# Patient Record
Sex: Female | Born: 1950
Health system: Southern US, Community
[De-identification: ages and names within clinical notes are randomized; demographics above are authoritative.]

## PROBLEM LIST (undated history)

## (undated) DIAGNOSIS — W19XXXA Unspecified fall, initial encounter: Secondary | ICD-10-CM

## (undated) DIAGNOSIS — F419 Anxiety disorder, unspecified: Secondary | ICD-10-CM

## (undated) DIAGNOSIS — N189 Chronic kidney disease, unspecified: Secondary | ICD-10-CM

## (undated) DIAGNOSIS — I10 Essential (primary) hypertension: Secondary | ICD-10-CM

## (undated) DIAGNOSIS — J189 Pneumonia, unspecified organism: Secondary | ICD-10-CM

## (undated) DIAGNOSIS — F4310 Post-traumatic stress disorder, unspecified: Secondary | ICD-10-CM

## (undated) DIAGNOSIS — Z87442 Personal history of urinary calculi: Secondary | ICD-10-CM

## (undated) DIAGNOSIS — Z8619 Personal history of other infectious and parasitic diseases: Secondary | ICD-10-CM

## (undated) DIAGNOSIS — M1990 Unspecified osteoarthritis, unspecified site: Secondary | ICD-10-CM

## (undated) DIAGNOSIS — IMO0002 Reserved for concepts with insufficient information to code with codable children: Secondary | ICD-10-CM

## (undated) DIAGNOSIS — E669 Obesity, unspecified: Secondary | ICD-10-CM

## (undated) DIAGNOSIS — E049 Nontoxic goiter, unspecified: Secondary | ICD-10-CM

## (undated) DIAGNOSIS — K219 Gastro-esophageal reflux disease without esophagitis: Secondary | ICD-10-CM

## (undated) DIAGNOSIS — E119 Type 2 diabetes mellitus without complications: Secondary | ICD-10-CM

## (undated) DIAGNOSIS — E785 Hyperlipidemia, unspecified: Secondary | ICD-10-CM

## (undated) DIAGNOSIS — E039 Hypothyroidism, unspecified: Secondary | ICD-10-CM

## (undated) DIAGNOSIS — E079 Disorder of thyroid, unspecified: Secondary | ICD-10-CM

## (undated) DIAGNOSIS — F32A Depression, unspecified: Secondary | ICD-10-CM

## (undated) DIAGNOSIS — H269 Unspecified cataract: Secondary | ICD-10-CM

## (undated) DIAGNOSIS — F0390 Unspecified dementia without behavioral disturbance: Secondary | ICD-10-CM

## (undated) DIAGNOSIS — IMO0001 Reserved for inherently not codable concepts without codable children: Secondary | ICD-10-CM

## (undated) DIAGNOSIS — G5600 Carpal tunnel syndrome, unspecified upper limb: Secondary | ICD-10-CM

## (undated) DIAGNOSIS — F329 Major depressive disorder, single episode, unspecified: Secondary | ICD-10-CM

## (undated) HISTORY — PX: CATARACT EXTRACTION, BILATERAL: SHX1313

## (undated) HISTORY — DX: Hyperlipidemia, unspecified: E78.5

## (undated) HISTORY — DX: Reserved for concepts with insufficient information to code with codable children: IMO0002

## (undated) HISTORY — DX: Carpal tunnel syndrome, unspecified upper limb: G56.00

## (undated) HISTORY — DX: Anxiety disorder, unspecified: F41.9

## (undated) HISTORY — PX: COLONOSCOPY W/ POLYPECTOMY: SHX1380

## (undated) HISTORY — DX: Obesity, unspecified: E66.9

## (undated) HISTORY — DX: Unspecified cataract: H26.9

## (undated) HISTORY — DX: Major depressive disorder, single episode, unspecified: F32.9

## (undated) HISTORY — DX: Depression, unspecified: F32.A

## (undated) HISTORY — DX: Essential (primary) hypertension: I10

---

## 1974-08-13 DIAGNOSIS — I499 Cardiac arrhythmia, unspecified: Secondary | ICD-10-CM

## 1974-08-13 HISTORY — DX: Cardiac arrhythmia, unspecified: I49.9

## 1977-08-13 DIAGNOSIS — S0500XA Injury of conjunctiva and corneal abrasion without foreign body, unspecified eye, initial encounter: Secondary | ICD-10-CM

## 1977-08-13 HISTORY — DX: Injury of conjunctiva and corneal abrasion without foreign body, unspecified eye, initial encounter: S05.00XA

## 1994-08-13 HISTORY — PX: ABDOMINAL HYSTERECTOMY: SHX81

## 1998-08-01 ENCOUNTER — Encounter: Admission: RE | Admit: 1998-08-01 | Discharge: 1998-08-01 | Payer: Self-pay | Admitting: *Deleted

## 2000-02-01 ENCOUNTER — Emergency Department (HOSPITAL_COMMUNITY): Admission: EM | Admit: 2000-02-01 | Discharge: 2000-02-01 | Payer: Self-pay | Admitting: Emergency Medicine

## 2000-02-01 ENCOUNTER — Encounter: Payer: Self-pay | Admitting: Emergency Medicine

## 2004-06-20 ENCOUNTER — Ambulatory Visit: Payer: Self-pay | Admitting: Internal Medicine

## 2005-02-07 ENCOUNTER — Ambulatory Visit: Payer: Self-pay | Admitting: Internal Medicine

## 2005-02-28 ENCOUNTER — Ambulatory Visit: Payer: Self-pay | Admitting: Licensed Clinical Social Worker

## 2005-03-07 ENCOUNTER — Ambulatory Visit: Payer: Self-pay | Admitting: Internal Medicine

## 2005-05-09 ENCOUNTER — Ambulatory Visit: Payer: Self-pay | Admitting: Internal Medicine

## 2005-06-14 ENCOUNTER — Ambulatory Visit: Payer: Self-pay | Admitting: Internal Medicine

## 2005-07-09 ENCOUNTER — Ambulatory Visit: Payer: Self-pay | Admitting: Internal Medicine

## 2005-09-10 ENCOUNTER — Ambulatory Visit: Payer: Self-pay | Admitting: Internal Medicine

## 2005-11-16 ENCOUNTER — Emergency Department (HOSPITAL_COMMUNITY): Admission: EM | Admit: 2005-11-16 | Discharge: 2005-11-16 | Payer: Self-pay | Admitting: Emergency Medicine

## 2006-01-01 ENCOUNTER — Ambulatory Visit: Payer: Self-pay | Admitting: Internal Medicine

## 2006-02-26 ENCOUNTER — Ambulatory Visit: Payer: Self-pay | Admitting: Internal Medicine

## 2006-03-04 ENCOUNTER — Ambulatory Visit: Payer: Self-pay | Admitting: Internal Medicine

## 2006-04-30 ENCOUNTER — Ambulatory Visit: Payer: Self-pay | Admitting: Internal Medicine

## 2006-05-07 ENCOUNTER — Ambulatory Visit: Payer: Self-pay | Admitting: Internal Medicine

## 2006-09-10 ENCOUNTER — Ambulatory Visit: Payer: Self-pay | Admitting: Internal Medicine

## 2006-10-15 ENCOUNTER — Ambulatory Visit: Payer: Self-pay | Admitting: Internal Medicine

## 2006-12-25 DIAGNOSIS — E785 Hyperlipidemia, unspecified: Secondary | ICD-10-CM | POA: Insufficient documentation

## 2006-12-25 DIAGNOSIS — I10 Essential (primary) hypertension: Secondary | ICD-10-CM | POA: Insufficient documentation

## 2006-12-25 DIAGNOSIS — E669 Obesity, unspecified: Secondary | ICD-10-CM | POA: Insufficient documentation

## 2006-12-26 ENCOUNTER — Ambulatory Visit: Payer: Self-pay | Admitting: Internal Medicine

## 2007-01-10 ENCOUNTER — Encounter: Admission: RE | Admit: 2007-01-10 | Discharge: 2007-01-10 | Payer: Self-pay | Admitting: Internal Medicine

## 2007-02-13 DIAGNOSIS — F329 Major depressive disorder, single episode, unspecified: Secondary | ICD-10-CM | POA: Insufficient documentation

## 2007-02-13 DIAGNOSIS — F419 Anxiety disorder, unspecified: Secondary | ICD-10-CM | POA: Insufficient documentation

## 2007-03-04 ENCOUNTER — Ambulatory Visit: Payer: Self-pay | Admitting: Internal Medicine

## 2007-03-04 DIAGNOSIS — G5600 Carpal tunnel syndrome, unspecified upper limb: Secondary | ICD-10-CM | POA: Insufficient documentation

## 2007-03-04 DIAGNOSIS — L570 Actinic keratosis: Secondary | ICD-10-CM | POA: Insufficient documentation

## 2007-06-11 ENCOUNTER — Ambulatory Visit: Payer: Self-pay | Admitting: Internal Medicine

## 2007-06-11 LAB — CONVERTED CEMR LAB
Cholesterol, target level: 200 mg/dL
HDL goal, serum: 40 mg/dL
LDL Goal: 130 mg/dL

## 2007-08-15 ENCOUNTER — Telehealth: Payer: Self-pay | Admitting: Internal Medicine

## 2007-08-21 ENCOUNTER — Telehealth: Payer: Self-pay | Admitting: Internal Medicine

## 2007-09-18 ENCOUNTER — Ambulatory Visit: Payer: Self-pay | Admitting: Internal Medicine

## 2007-09-18 DIAGNOSIS — T887XXA Unspecified adverse effect of drug or medicament, initial encounter: Secondary | ICD-10-CM | POA: Insufficient documentation

## 2007-09-18 LAB — CONVERTED CEMR LAB
ALT: 19 units/L (ref 0–35)
AST: 19 units/L (ref 0–37)
Albumin: 3.8 g/dL (ref 3.5–5.2)
Alkaline Phosphatase: 72 units/L (ref 39–117)
Bilirubin, Direct: 0.2 mg/dL (ref 0.0–0.3)
Cholesterol: 224 mg/dL (ref 0–200)
Direct LDL: 145.2 mg/dL
HDL: 24.8 mg/dL — ABNORMAL LOW (ref >39.0)
Total Bilirubin: 1.2 mg/dL (ref 0.3–1.2)
Total CHOL/HDL Ratio: 9
Total Protein: 6.6 g/dL (ref 6.0–8.3)
Triglycerides: 224 mg/dL (ref 0–149)
VLDL: 45 mg/dL — ABNORMAL HIGH (ref 0–40)

## 2007-12-18 ENCOUNTER — Ambulatory Visit: Payer: Self-pay | Admitting: Internal Medicine

## 2008-02-10 ENCOUNTER — Encounter: Admission: RE | Admit: 2008-02-10 | Discharge: 2008-02-10 | Payer: Self-pay | Admitting: Internal Medicine

## 2008-02-11 ENCOUNTER — Ambulatory Visit: Payer: Self-pay | Admitting: Internal Medicine

## 2008-02-11 LAB — CONVERTED CEMR LAB
ALT: 21 units/L (ref 0–35)
AST: 19 units/L (ref 0–37)
Albumin: 3.5 g/dL (ref 3.5–5.2)
Alkaline Phosphatase: 72 units/L (ref 39–117)
Bilirubin, Direct: 0.1 mg/dL (ref 0.0–0.3)
Cholesterol: 160 mg/dL (ref 0–200)
HDL: 24.2 mg/dL — ABNORMAL LOW (ref >39.0)
LDL Cholesterol: 103 mg/dL — ABNORMAL HIGH (ref 0–99)
Total Bilirubin: 0.8 mg/dL (ref 0.3–1.2)
Total CHOL/HDL Ratio: 6.6
Total Protein: 6.2 g/dL (ref 6.0–8.3)
Triglycerides: 162 mg/dL — ABNORMAL HIGH (ref 0–149)
VLDL: 32 mg/dL (ref 0–40)

## 2008-02-18 ENCOUNTER — Ambulatory Visit: Payer: Self-pay | Admitting: Internal Medicine

## 2008-02-23 ENCOUNTER — Telehealth: Payer: Self-pay | Admitting: Internal Medicine

## 2008-05-25 ENCOUNTER — Ambulatory Visit: Payer: Self-pay | Admitting: Internal Medicine

## 2008-08-12 ENCOUNTER — Ambulatory Visit: Payer: Self-pay | Admitting: Internal Medicine

## 2008-08-12 LAB — CONVERTED CEMR LAB
Albumin: 3.9 g/dL (ref 3.5–5.2)
BUN: 13 mg/dL (ref 6–23)
Bilirubin, Direct: 0.1 mg/dL (ref 0.0–0.3)
Calcium: 9.3 mg/dL (ref 8.4–10.5)
GFR calc Af Amer: 111 mL/min
GFR calc non Af Amer: 91 mL/min
Glucose, Bld: 141 mg/dL — ABNORMAL HIGH (ref 70–99)
HDL: 30.6 mg/dL — ABNORMAL LOW (ref >39.0)
LDL Cholesterol: 106 mg/dL — ABNORMAL HIGH (ref 0–99)
Potassium: 3.9 meq/L (ref 3.5–5.1)
Sodium: 143 meq/L (ref 135–145)
T3, Free: 3.7 pg/mL (ref 2.3–4.2)
TSH: 0.35 microintl units/mL (ref 0.35–5.50)
Total Protein: 6.9 g/dL (ref 6.0–8.3)
VLDL: 39 mg/dL (ref 0–40)

## 2008-08-27 ENCOUNTER — Ambulatory Visit: Payer: Self-pay | Admitting: Internal Medicine

## 2008-08-27 DIAGNOSIS — M899 Disorder of bone, unspecified: Secondary | ICD-10-CM | POA: Insufficient documentation

## 2008-08-27 DIAGNOSIS — M949 Disorder of cartilage, unspecified: Secondary | ICD-10-CM

## 2008-08-27 LAB — CONVERTED CEMR LAB: Hgb A1c MFr Bld: 6.6 % — ABNORMAL HIGH (ref 4.6–6.0)

## 2008-09-08 ENCOUNTER — Telehealth (INDEPENDENT_AMBULATORY_CARE_PROVIDER_SITE_OTHER): Payer: Self-pay | Admitting: *Deleted

## 2008-11-26 ENCOUNTER — Ambulatory Visit: Payer: Self-pay | Admitting: Internal Medicine

## 2009-02-25 ENCOUNTER — Ambulatory Visit: Payer: Self-pay | Admitting: Internal Medicine

## 2009-03-11 ENCOUNTER — Telehealth: Payer: Self-pay | Admitting: Internal Medicine

## 2009-05-06 ENCOUNTER — Ambulatory Visit: Payer: Self-pay | Admitting: Internal Medicine

## 2009-05-17 ENCOUNTER — Telehealth: Payer: Self-pay | Admitting: Internal Medicine

## 2009-06-22 ENCOUNTER — Telehealth: Payer: Self-pay | Admitting: Internal Medicine

## 2009-06-23 ENCOUNTER — Telehealth: Payer: Self-pay | Admitting: *Deleted

## 2009-08-11 ENCOUNTER — Ambulatory Visit: Payer: Self-pay | Admitting: Internal Medicine

## 2009-08-15 LAB — CONVERTED CEMR LAB: Vit D, 25-Hydroxy: 27 ng/mL — ABNORMAL LOW (ref 30–89)

## 2009-11-09 ENCOUNTER — Ambulatory Visit: Payer: Self-pay | Admitting: Internal Medicine

## 2010-04-04 ENCOUNTER — Ambulatory Visit: Payer: Self-pay | Admitting: Internal Medicine

## 2010-04-04 LAB — CONVERTED CEMR LAB
AST: 19 units/L (ref 0–37)
BUN: 13 mg/dL (ref 6–23)
Basophils Absolute: 0 10*3/uL (ref 0.0–0.1)
Calcium: 9.4 mg/dL (ref 8.4–10.5)
Cholesterol: 156 mg/dL (ref 0–200)
Creatinine, Ser: 0.9 mg/dL (ref 0.4–1.2)
Direct LDL: 98.5 mg/dL
GFR calc non Af Amer: 71.51 mL/min (ref >60)
Glucose, Bld: 121 mg/dL — ABNORMAL HIGH (ref 70–99)
Glucose, Urine, Semiquant: NEGATIVE
HCT: 41.3 % (ref 36.0–46.0)
HDL: 29.2 mg/dL — ABNORMAL LOW (ref >39.00)
Ketones, urine, test strip: NEGATIVE
Lymphs Abs: 2.5 10*3/uL (ref 0.7–4.0)
Monocytes Relative: 5 % (ref 3.0–12.0)
Neutrophils Relative %: 58.9 % (ref 43.0–77.0)
Platelets: 271 10*3/uL (ref 150.0–400.0)
Potassium: 4.1 meq/L (ref 3.5–5.1)
Protein, U semiquant: NEGATIVE
RDW: 12.9 % (ref 11.5–14.6)
Specific Gravity, Urine: 1.025
TSH: 0.2 microintl units/mL — ABNORMAL LOW (ref 0.35–5.50)
Total Bilirubin: 0.7 mg/dL (ref 0.3–1.2)
Triglycerides: 221 mg/dL — ABNORMAL HIGH (ref 0.0–149.0)
pH: 6.5

## 2010-04-10 ENCOUNTER — Ambulatory Visit: Payer: Self-pay | Admitting: Internal Medicine

## 2010-04-10 DIAGNOSIS — E1165 Type 2 diabetes mellitus with hyperglycemia: Secondary | ICD-10-CM | POA: Insufficient documentation

## 2010-04-10 DIAGNOSIS — IMO0002 Reserved for concepts with insufficient information to code with codable children: Secondary | ICD-10-CM | POA: Insufficient documentation

## 2010-04-10 DIAGNOSIS — E1169 Type 2 diabetes mellitus with other specified complication: Secondary | ICD-10-CM

## 2010-05-01 ENCOUNTER — Ambulatory Visit: Payer: Self-pay | Admitting: Internal Medicine

## 2010-05-08 ENCOUNTER — Ambulatory Visit: Payer: Self-pay | Admitting: Internal Medicine

## 2010-05-08 LAB — HM DIABETES FOOT EXAM

## 2010-06-07 ENCOUNTER — Telehealth: Payer: Self-pay | Admitting: Internal Medicine

## 2010-07-31 ENCOUNTER — Ambulatory Visit: Payer: Self-pay | Admitting: Internal Medicine

## 2010-09-11 ENCOUNTER — Telehealth: Payer: Self-pay | Admitting: Internal Medicine

## 2010-09-12 NOTE — Assessment & Plan Note (Deleted)
Summary: 3 mo rov/mm   Vital Signs:  Patient profile:   60 year old female Height:      63 inches Weight:      204 pounds BMI:     36.27 Temp:     98.2 degrees F oral Pulse rate:   80 / minute Resp:     14 per minute BP sitting:   116 / 80  (left arm)  Vitals Entered By: Willy Eddy, LPN (November 09, 2009 9:15 AM)  Nutrition Counseling: Patient's BMI is greater than 25 and therefore counseled on weight management options. CC: roa, Hypertension Management   CC:  roa and Hypertension Management.  History of Present Illness: mood is good but weight is still a proplem the abilify and the pristiq works very well   Hyperlipidemia Follow-Up      This is a 60 year old woman who presents for Hyperlipidemia follow-up.  The patient denies muscle aches, GI upset, abdominal pain, flushing, itching, constipation, diarrhea, and fatigue.  Compliance with medications (by patient report) has been near 100%.  Dietary compliance has been excellent.  Adjunctive measures currently used by the patient include weight reduction.    Hypertension History:      She denies headache, chest pain, palpitations, dyspnea with exertion, orthopnea, PND, peripheral edema, visual symptoms, neurologic problems, syncope, and side effects from treatment.  at goal.        Positive major cardiovascular risk factors include female age 67 years old or older, hyperlipidemia, and hypertension.  Negative major cardiovascular risk factors include non-tobacco-user status.     Preventive Screening-Counseling & Management  Alcohol-Tobacco     Smoking Status: never  Problems Prior to Update: 1)  Osteopenia  (ICD-733.90) 2)  Uns Advrs Eff Uns Rx Medicinal&biological Sbstnc  (ICD-995.20) 3)  Family History Depression  (ICD-V17.0) 4)  Actinic Keratosis  (ICD-702.0) 5)  Colon Cancer, Hx of  (ICD-V10.05) 6)  Carpal Tunnel Syndrome  (ICD-354.0) 7)  Depression  (ICD-311) 8)  Obesity  (ICD-278.00) 9)  Hyperlipidemia   (ICD-272.4) 10)  Hypertension  (ICD-401.9)  Current Problems (verified): 1)  Osteopenia  (ICD-733.90) 2)  Uns Advrs Eff Uns Rx Medicinal&biological Sbstnc  (ICD-995.20) 3)  Family History Depression  (ICD-V17.0) 4)  Actinic Keratosis  (ICD-702.0) 5)  Colon Cancer, Hx of  (ICD-V10.05) 6)  Carpal Tunnel Syndrome  (ICD-354.0) 7)  Depression  (ICD-311) 8)  Obesity  (ICD-278.00) 9)  Hyperlipidemia  (ICD-272.4) 10)  Hypertension  (ICD-401.9)  Medications Prior to Update: 1)  Valtrex 1 Gm Tabs (Valacyclovir Hcl) .... As Needed 2)  Lotensin Hct 20-12.5 Mg  Tabs (Benazepril-Hydrochlorothiazide) .... One By Mouth Daily 3)  Simcor 500-20 Mg  Tb24 (Niacin-Simvastatin) .... One By Mouth Daily 4)  Multivitamins   Caps (Multiple Vitamin) .Marland Kitchen.. 1 Once Daily 5)  Tylenol Pm Extra Strength 500-25 Mg Tabs (Diphenhydramine-Apap (Sleep)) .... Q Hs As Needed 6)  Pristiq 100 Mg Xr24h-Tab (Desvenlafaxine Succinate) .Marland Kitchen.. 1 Once Daily 7)  Ergocalciferol 50000 Unit Caps (Ergocalciferol) .... One By Mouth Weekly 8)  Alprazolam 0.5 Mg Tabs (Alprazolam) .... One By Mouth Three Times A Day As Needed Anxiety 9)  Abilify 5 Mg Tabs (Aripiprazole) .... One By Mouth Daily  Current Medications (verified): 1)  Valtrex 1 Gm Tabs (Valacyclovir Hcl) .... As Needed 2)  Lotensin Hct 20-12.5 Mg  Tabs (Benazepril-Hydrochlorothiazide) .... One By Mouth Daily 3)  Simcor 500-20 Mg  Tb24 (Niacin-Simvastatin) .... One By Mouth Daily 4)  Multivitamins   Caps (Multiple Vitamin) .Marland KitchenMarland KitchenMarland Kitchen  1 Once Daily 5)  Tylenol Pm Extra Strength 500-25 Mg Tabs (Diphenhydramine-Apap (Sleep)) .... Q Hs As Needed 6)  Pristiq 100 Mg Xr24h-Tab (Desvenlafaxine Succinate) .Marland Kitchen.. 1 Once Daily 7)  Ergocalciferol 50000 Unit Caps (Ergocalciferol) .... One By Mouth Weekly 8)  Alprazolam 0.5 Mg Tabs (Alprazolam) .... One By Mouth Three Times A Day As Needed Anxiety 9)  Abilify 5 Mg Tabs (Aripiprazole) .... One By Mouth Daily  Allergies (verified): No Known Drug  Allergies  Past History:  Family History: Last updated: 09/18/2007 Family History Depression Family History High cholesterol Family History Hypertension  Social History: Last updated: 06/11/2007 Married Never Smoked  Risk Factors: Smoking Status: never (11/09/2009)  Past medical, surgical, family and social histories (including risk factors) reviewed, and no changes noted (except as noted below).  Past Medical History: Reviewed history from 03/04/2007 and no changes required. Hypertension Hyperlipidemia obesity Depression carpel tunnel syndromel Colon cancer, hx of  Past Surgical History: Reviewed history from 03/04/2007 and no changes required. Caesarean section x 3 Hysterectomy 1996  Family History: Reviewed history from 09/18/2007 and no changes required. Family History Depression Family History High cholesterol Family History Hypertension  Social History: Reviewed history from 06/11/2007 and no changes required. Married Never Smoked  Review of Systems  The patient denies anorexia, fever, weight loss, weight gain, vision loss, decreased hearing, hoarseness, chest pain, syncope, dyspnea on exertion, peripheral edema, prolonged cough, headaches, hemoptysis, abdominal pain, melena, hematochezia, severe indigestion/heartburn, hematuria, incontinence, genital sores, muscle weakness, suspicious skin lesions, transient blindness, difficulty walking, depression, unusual weight change, abnormal bleeding, enlarged lymph nodes, angioedema, and breast masses.    Physical Exam  General:  Well-developed,well-nourished,in no acute distress; alert,appropriate and cooperative throughout examination  slight weight gain Head:  Normocephalic and atraumatic without obvious abnormalities. No apparent alopecia or balding. Eyes:  vision grossly intact.   Ears:  R ear normal and L ear normal.   Nose:  no external deformity and no nasal discharge.   Mouth:  Oral mucosa and oropharynx  without lesions or exudates.  Teeth in good repair. Neck:  No deformities, masses, or tenderness noted. Lungs:  normal respiratory effort and no wheezes.   Heart:  normal rate and regular rhythm.   Abdomen:  soft, non-tender, and normal bowel sounds.     Impression & Recommendations:  Problem # 1:  HYPERLIPIDEMIA (ICD-272.4)  Her updated medication list for this problem includes:    Simcor 500-20 Mg Tb24 (Niacin-simvastatin) ..... One by mouth daily  Labs Reviewed: SGOT: 22 (08/12/2008)   SGPT: 21 (08/12/2008)  Lipid Goals: Chol Goal: 200 (06/11/2007)   HDL Goal: 40 (06/11/2007)   LDL Goal: 130 (06/11/2007)   TG Goal: 150 (06/11/2007)  Prior 10 Yr Risk Heart Disease: 17 % (05/25/2008)   HDL:27.20 (05/06/2009), 30.6 (08/12/2008)  LDL:106 (08/12/2008), 103 (02/11/2008)  Chol:169 (05/06/2009), 176 (08/12/2008)  Trig:196 (08/12/2008), 162 (02/11/2008)  Problem # 2:  DEPRESSION (ICD-311)  Her updated medication list for this problem includes:    Pristiq 100 Mg Xr24h-tab (Desvenlafaxine succinate) .Marland Kitchen... 1 once daily    Alprazolam 0.5 Mg Tabs (Alprazolam) ..... One by mouth three times a day as needed anxiety  Discussed treatment options, including trial of antidpressant medication. Will refer to behavioral health. Follow-up call in in 24-48 hours and recheck in 2 weeks, sooner as needed. Patient agrees to call if any worsening of symptoms or thoughts of doing harm arise. Verified that the patient has no suicidal ideation at this time.   Problem # 3:  HYPERTENSION (ICD-401.9) Assessment: Improved  Her updated medication list for this problem includes:    Lotensin Hct 20-12.5 Mg Tabs (Benazepril-hydrochlorothiazide) ..... One by mouth daily  BP today: 116/80 Prior BP: 120/70 (08/11/2009)  Prior 10 Yr Risk Heart Disease: 17 % (05/25/2008)  Labs Reviewed: K+: 3.9 (08/12/2008) Creat: : 0.7 (08/12/2008)   Chol: 169 (05/06/2009)   HDL: 27.20 (05/06/2009)   LDL: 106 (08/12/2008)   TG:  196 (08/12/2008)  Complete Medication List: 1)  Valtrex 1 Gm Tabs (Valacyclovir hcl) .... As needed 2)  Lotensin Hct 20-12.5 Mg Tabs (Benazepril-hydrochlorothiazide) .... One by mouth daily 3)  Simcor 500-20 Mg Tb24 (Niacin-simvastatin) .... One by mouth daily 4)  Multivitamins Caps (Multiple vitamin) .Marland Kitchen.. 1 once daily 5)  Tylenol Pm Extra Strength 500-25 Mg Tabs (Diphenhydramine-apap (sleep)) .... Q hs as needed 6)  Pristiq 100 Mg Xr24h-tab (Desvenlafaxine succinate) .Marland Kitchen.. 1 once daily 7)  Ergocalciferol 50000 Unit Caps (Ergocalciferol) .... One by mouth weekly 8)  Alprazolam 0.5 Mg Tabs (Alprazolam) .... One by mouth three times a day as needed anxiety 9)  Abilify 5 Mg Tabs (Aripiprazole) .... One by mouth daily  Hypertension Assessment/Plan:      The patient's hypertensive risk group is category B: At least one risk factor (excluding diabetes) with no target organ damage.  Her calculated 10 year risk of coronary heart disease is 17 %.  Today's blood pressure is 116/80.  Her blood pressure goal is < 140/90.  Patient Instructions: 1)  Please schedule a follow-up appointment in 4 months.  CPX Prescriptions: ALPRAZOLAM 0.5 MG TABS (ALPRAZOLAM) one by mouth three times a day as needed anxiety  #90 x 3   Entered by:   Willy Eddy, LPN   Authorized by:   Stacie Glaze MD   Signed by:   Willy Eddy, LPN on 81/19/1478   Method used:   Print then Give to Patient   RxID:   [id number]

## 2010-09-12 NOTE — Assessment & Plan Note (Deleted)
Summary: 1 month rov/njr   Vital Signs:  Patient profile:   60 year old female Weight:      202 pounds Temp:     98.5 degrees F oral Pulse rate:   86 / minute Pulse rhythm:   regular Resp:     12 per minute BP sitting:   120 / 62  Vitals Entered By: Lynann Beaver CMA (May 08, 2010 4:14 PM) CC: rov, Hypertension Management Is Patient Diabetic? No Pain Assessment Patient in pain? no        Primary Care Provider:  Stacie Glaze MD  CC:  rov and Hypertension Management.  History of Present Illness: for blood pressure management and follow up of moodd and antidepressant therapy blood sugars new diagnosis of prediabetes has not been monitering blood glucose weigth has been an issue mood disorder has resulted in stress eating  Hypertension History:      She denies headache, chest pain, palpitations, dyspnea with exertion, orthopnea, PND, peripheral edema, visual symptoms, neurologic problems, syncope, and side effects from treatment.        Positive major cardiovascular risk factors include female age 67 years old or older, diabetes, hyperlipidemia, and hypertension.  Negative major cardiovascular risk factors include non-tobacco-user status.     Preventive Screening-Counseling & Management  Alcohol-Tobacco     Smoking Status: quit     Smoke Cessation Stage: ready     Tobacco Counseling: to remain off tobacco products  Problems Prior to Update: 1)  Preventive Health Care  (ICD-V70.0) 2)  Diab W/oth Manifests Type Ii/uns Type Uncntrl  (ICD-250.82) 3)  Osteopenia  (ICD-733.90) 4)  Uns Advrs Eff Uns Rx Medicinal&biological Sbstnc  (ICD-995.20) 5)  Family History Depression  (ICD-V17.0) 6)  Actinic Keratosis  (ICD-702.0) 7)  Colon Cancer, Hx of  (ICD-V10.05) 8)  Carpal Tunnel Syndrome  (ICD-354.0) 9)  Depression  (ICD-311) 10)  Obesity  (ICD-278.00) 11)  Hyperlipidemia  (ICD-272.4) 12)  Hypertension  (ICD-401.9)  Medications Prior to Update: 1)  Valtrex 1 Gm  Tabs (Valacyclovir Hcl) .... As Needed 2)  Lotensin Hct 20-12.5 Mg  Tabs (Benazepril-Hydrochlorothiazide) .... One By Mouth Daily 3)  Crestor 10 Mg Tabs (Rosuvastatin Calcium) .... One By Mouth Daily 4)  Multivitamins   Caps (Multiple Vitamin) .Marland Kitchen.. 1 Once Daily 5)  Tylenol Pm Extra Strength 500-25 Mg Tabs (Diphenhydramine-Apap (Sleep)) .... Q Hs As Needed 6)  Pristiq 100 Mg Xr24h-Tab (Desvenlafaxine Succinate) .Marland Kitchen.. 1 Once Daily 7)  Ergocalciferol 50000 Unit Caps (Ergocalciferol) .... One By Mouth Weekly 8)  Alprazolam 0.5 Mg Tabs (Alprazolam) .... One By Mouth Three Times A Day As Needed Anxiety 9)  Abilify 5 Mg Tabs (Aripiprazole) .... One By Mouth Daily 10)  Welchol 3.75 Gm Pack (Colesevelam Hcl) .... Disolve in Water or Jouice Once A Day ( 8oz)  Current Medications (verified): 1)  Valtrex 1 Gm Tabs (Valacyclovir Hcl) .... As Needed 2)  Lotensin Hct 20-12.5 Mg  Tabs (Benazepril-Hydrochlorothiazide) .... One By Mouth Daily 3)  Crestor 10 Mg Tabs (Rosuvastatin Calcium) .... One By Mouth Daily 4)  Multivitamins   Caps (Multiple Vitamin) .Marland Kitchen.. 1 Once Daily 5)  Tylenol Pm Extra Strength 500-25 Mg Tabs (Diphenhydramine-Apap (Sleep)) .... Q Hs As Needed 6)  Pristiq 100 Mg Xr24h-Tab (Desvenlafaxine Succinate) .Marland Kitchen.. 1 Once Daily 7)  Alprazolam 0.5 Mg Tabs (Alprazolam) .... One By Mouth Three Times A Day As Needed Anxiety 8)  Abilify 5 Mg Tabs (Aripiprazole) .... One By Mouth Daily 9)  Welchol 625 Mg Tabs (  Colesevelam Hcl) .... Two By Mouth Three Times A Day  Allergies (verified): No Known Drug Allergies  Past History:  Family History: Last updated: 09/18/2007 Family History Depression Family History High cholesterol Family History Hypertension  Social History: Last updated: 06/11/2007 Married Never Smoked  Risk Factors: Smoking Status: quit (05/08/2010)  Past medical, surgical, family and social histories (including risk factors) reviewed, and no changes noted (except as noted  below).  Past Medical History: Reviewed history from 03/04/2007 and no changes required. Hypertension Hyperlipidemia obesity Depression carpel tunnel syndromel Colon cancer, hx of  Past Surgical History: Reviewed history from 03/04/2007 and no changes required. Caesarean section x 3 Hysterectomy 1996  Family History: Reviewed history from 09/18/2007 and no changes required. Family History Depression Family History High cholesterol Family History Hypertension  Social History: Reviewed history from 06/11/2007 and no changes required. Married Never Smoked Smoking Status:  quit  Review of Systems  The patient denies anorexia, fever, weight loss, weight gain, vision loss, decreased hearing, hoarseness, chest pain, syncope, dyspnea on exertion, peripheral edema, prolonged cough, headaches, hemoptysis, abdominal pain, melena, hematochezia, severe indigestion/heartburn, hematuria, incontinence, genital sores, muscle weakness, suspicious skin lesions, transient blindness, difficulty walking, depression, unusual weight change, abnormal bleeding, enlarged lymph nodes, angioedema, and breast masses.    Physical Exam  General:  alert and overweight-appearing.   Head:  normocephalic and atraumatic.   Eyes:  pupils equal and pupils round.   Ears:  R ear normal and L ear normal.   Nose:  no external deformity and no nasal discharge.   Mouth:  good dentition and pharynx pink and moist.   Lungs:  normal respiratory effort and no wheezes.   Heart:  normal rate and regular rhythm.   Abdomen:  soft, non-tender, and normal bowel sounds.   Extremities:  1+ left pedal edema and 1+ right pedal edema.   Neurologic:  alert & oriented X3 and DTRs symmetrical and normal.    Diabetes Management Exam:    Foot Exam (with socks and/or shoes not present):       Sensory-Pinprick/Light touch:          Left medial foot (L-4): diminished          Left dorsal foot (L-5): diminished          Left lateral  foot (S-1): diminished          Right medial foot (L-4): diminished          Right dorsal foot (L-5): diminished          Right lateral foot (S-1): diminished   Impression & Recommendations:  Problem # 1:  DIAB W/OTH MANIFESTS TYPE II/UNS TYPE UNCNTRL (ICD-250.82)  Her updated medication list for this problem includes:    Lotensin Hct 20-12.5 Mg Tabs (Benazepril-hydrochlorothiazide) ..... One by mouth daily  Labs Reviewed: Creat: 0.9 (04/04/2010)    Reviewed HgBA1c results: 6.7 (05/01/2010)  6.6 (08/27/2008)  Problem # 2:  HYPERLIPIDEMIA (ICD-272.4)  Her updated medication list for this problem includes:    Crestor 10 Mg Tabs (Rosuvastatin calcium) ..... One by mouth daily    Welchol 625 Mg Tabs (Colesevelam hcl) .Marland Kitchen..Marland Kitchen Two by mouth three times a day  Labs Reviewed: SGOT: 19 (04/04/2010)   SGPT: 15 (04/04/2010)  Lipid Goals: Chol Goal: 200 (06/11/2007)   HDL Goal: 40 (06/11/2007)   LDL Goal: 130 (06/11/2007)   TG Goal: 150 (06/11/2007)  10 Yr Risk Heart Disease: > 32 % Prior 10 Yr Risk Heart Disease: 17 % (05/25/2008)  HDL:29.20 (04/04/2010), 27.20 (05/06/2009)  LDL:106 (08/12/2008), 103 (02/11/2008)  Chol:156 (04/04/2010), 169 (05/06/2009)  Trig:221.0 (04/04/2010), 196 (08/12/2008)  Problem # 3:  DEPRESSION (ICD-311)  Her updated medication list for this problem includes:    Pristiq 100 Mg Xr24h-tab (Desvenlafaxine succinate) .Marland Kitchen... 1 once daily    Alprazolam 0.5 Mg Tabs (Alprazolam) ..... One by mouth three times a day as needed anxiety  Discussed treatment options, including trial of antidpressant medication. Will refer to behavioral health. Follow-up call in in 24-48 hours and recheck in 2 weeks, sooner as needed. Patient agrees to call if any worsening of symptoms or thoughts of doing harm arise. Verified that the patient has no suicidal ideation at this time.   Complete Medication List: 1)  Valtrex 1 Gm Tabs (Valacyclovir hcl) .... As needed 2)  Lotensin Hct 20-12.5  Mg Tabs (Benazepril-hydrochlorothiazide) .... One by mouth daily 3)  Crestor 10 Mg Tabs (Rosuvastatin calcium) .... One by mouth daily 4)  Multivitamins Caps (Multiple vitamin) .Marland Kitchen.. 1 once daily 5)  Tylenol Pm Extra Strength 500-25 Mg Tabs (Diphenhydramine-apap (sleep)) .... Q hs as needed 6)  Pristiq 100 Mg Xr24h-tab (Desvenlafaxine succinate) .Marland Kitchen.. 1 once daily 7)  Alprazolam 0.5 Mg Tabs (Alprazolam) .... One by mouth three times a day as needed anxiety 8)  Abilify 5 Mg Tabs (Aripiprazole) .... One by mouth daily 9)  Welchol 625 Mg Tabs (Colesevelam hcl) .... Two by mouth three times a day  Hypertension Assessment/Plan:      The patient's hypertensive risk group is category C: Target organ damage and/or diabetes.  Her calculated 10 year risk of coronary heart disease is > 32 %.  Today's blood pressure is 120/62.  Her blood pressure goal is < 140/90.  Patient Instructions: 1)  Please schedule a follow-up appointment in 3 months. Prescriptions: WELCHOL 625 MG TABS (COLESEVELAM HCL) two by mouth three times a day  #120 x 5   Entered and Authorized by:   Stacie Glaze MD   Signed by:   Stacie Glaze MD on 05/08/2010   Method used:   Electronically to        [facility]  [address] [phone]* (retail)       [address]       [place], [place]  [id number]       Ph: [phone] or [phone]       Fax: [phone]   RxID:   [id number]

## 2010-09-12 NOTE — Progress Notes (Signed)
Summary: Pt is req a script for Prednisone for swollen lymph node  Phone Note Call from Patient Call back at Home Phone [phone]   Caller: Patient Summary of Call: Pt called and said that she has a swollen lymph node at back of head on lft side neck.  Pt is req a script for prednisone to relieve inflamation. Pls call in to [facility] [phone]   Initial call taken by: Lucy Antig[place],  June 07, 2010 2:58 PM  Follow-up for Phone Call        no... needs exam to see what node is involved may schedule with stacey Follow-up by: Stacie Glaze MD,  June 08, 2010 8:55 AM  Additional Follow-up for Phone Call Additional follow up Details #1::        talked with pt and states node is smaller and doesnt want to come in now,but instructed watch it if it gets large again to come see a md Additional Follow-up by: Willy Eddy, LPN,  June 08, 2010 9:15 AM

## 2010-09-12 NOTE — Assessment & Plan Note (Deleted)
Summary: CPXNJR---PT [facility] // RS   Vital Signs:  Patient profile:   60 year old female Height:      63 inches Weight:      206 pounds BMI:     36.62 Temp:     98.2 degrees F oral Pulse rate:   84 / minute Resp:     14 per minute BP sitting:   110 / 70  (left arm)  Vitals Entered By: Willy Eddy, LPN (April 10, 2010 3:29 PM) CC: cpx Is Patient Diabetic? No   Primary Care Provider:  Stacie Glaze MD  CC:  cpx.  History of Present Illness: increased weight and increased thrist family reisk of DM HTN stable lipids not ncontrol on current meds discussion of labs and monitering  Preventive Screening-Counseling & Management  Alcohol-Tobacco     Smoking Status: never  Problems Prior to Update: 1)  Osteopenia  (ICD-733.90) 2)  Uns Advrs Eff Uns Rx Medicinal&biological Sbstnc  (ICD-995.20) 3)  Family History Depression  (ICD-V17.0) 4)  Actinic Keratosis  (ICD-702.0) 5)  Colon Cancer, Hx of  (ICD-V10.05) 6)  Carpal Tunnel Syndrome  (ICD-354.0) 7)  Depression  (ICD-311) 8)  Obesity  (ICD-278.00) 9)  Hyperlipidemia  (ICD-272.4) 10)  Hypertension  (ICD-401.9)  Current Problems (verified): 1)  Osteopenia  (ICD-733.90) 2)  Uns Advrs Eff Uns Rx Medicinal&biological Sbstnc  (ICD-995.20) 3)  Family History Depression  (ICD-V17.0) 4)  Actinic Keratosis  (ICD-702.0) 5)  Colon Cancer, Hx of  (ICD-V10.05) 6)  Carpal Tunnel Syndrome  (ICD-354.0) 7)  Depression  (ICD-311) 8)  Obesity  (ICD-278.00) 9)  Hyperlipidemia  (ICD-272.4) 10)  Hypertension  (ICD-401.9)  Medications Prior to Update: 1)  Valtrex 1 Gm Tabs (Valacyclovir Hcl) .... As Needed 2)  Lotensin Hct 20-12.5 Mg  Tabs (Benazepril-Hydrochlorothiazide) .... One By Mouth Daily 3)  Simcor 500-20 Mg  Tb24 (Niacin-Simvastatin) .... One By Mouth Daily 4)  Multivitamins   Caps (Multiple Vitamin) .Marland Kitchen.. 1 Once Daily 5)  Tylenol Pm Extra Strength 500-25 Mg Tabs (Diphenhydramine-Apap (Sleep)) .... Q Hs As Needed 6)  Pristiq  100 Mg Xr24h-Tab (Desvenlafaxine Succinate) .Marland Kitchen.. 1 Once Daily 7)  Ergocalciferol 50000 Unit Caps (Ergocalciferol) .... One By Mouth Weekly 8)  Alprazolam 0.5 Mg Tabs (Alprazolam) .... One By Mouth Three Times A Day As Needed Anxiety 9)  Abilify 5 Mg Tabs (Aripiprazole) .... One By Mouth Daily  Current Medications (verified): 1)  Valtrex 1 Gm Tabs (Valacyclovir Hcl) .... As Needed 2)  Lotensin Hct 20-12.5 Mg  Tabs (Benazepril-Hydrochlorothiazide) .... One By Mouth Daily 3)  Crestor 10 Mg Tabs (Rosuvastatin Calcium) .... One By Mouth Daily 4)  Multivitamins   Caps (Multiple Vitamin) .Marland Kitchen.. 1 Once Daily 5)  Tylenol Pm Extra Strength 500-25 Mg Tabs (Diphenhydramine-Apap (Sleep)) .... Q Hs As Needed 6)  Pristiq 100 Mg Xr24h-Tab (Desvenlafaxine Succinate) .Marland Kitchen.. 1 Once Daily 7)  Ergocalciferol 50000 Unit Caps (Ergocalciferol) .... One By Mouth Weekly 8)  Alprazolam 0.5 Mg Tabs (Alprazolam) .... One By Mouth Three Times A Day As Needed Anxiety 9)  Abilify 5 Mg Tabs (Aripiprazole) .... One By Mouth Daily 10)  Welchol 3.75 Gm Pack (Colesevelam Hcl) .... Disolve in Water or Jouice Once A Day ( 8oz)  Allergies (verified): No Known Drug Allergies  Past History:  Family History: Last updated: 09/18/2007 Family History Depression Family History High cholesterol Family History Hypertension  Social History: Last updated: 06/11/2007 Married Never Smoked  Risk Factors: Smoking Status: never (04/10/2010)  Past medical, surgical,  family and social histories (including risk factors) reviewed, and no changes noted (except as noted below).  Past Medical History: Reviewed history from 03/04/2007 and no changes required. Hypertension Hyperlipidemia obesity Depression carpel tunnel syndromel Colon cancer, hx of  Past Surgical History: Reviewed history from 03/04/2007 and no changes required. Caesarean section x 3 Hysterectomy 1996  Family History: Reviewed history from 09/18/2007 and no  changes required. Family History Depression Family History High cholesterol Family History Hypertension  Social History: Reviewed history from 06/11/2007 and no changes required. Married Never Smoked  Review of Systems  The patient denies anorexia, fever, weight loss, weight gain, vision loss, decreased hearing, hoarseness, chest pain, syncope, dyspnea on exertion, peripheral edema, prolonged cough, headaches, hemoptysis, abdominal pain, melena, hematochezia, severe indigestion/heartburn, hematuria, incontinence, genital sores, muscle weakness, suspicious skin lesions, transient blindness, difficulty walking, depression, unusual weight change, abnormal bleeding, enlarged lymph nodes, angioedema, and breast masses.         Flu Vaccine Consent Questions     Do you have a history of severe allergic reactions to this vaccine? no    Any prior history of allergic reactions to egg and/or gelatin? no    Do you have a sensitivity to the preservative Thimersol? no    Do you have a past history of Guillan-Barre Syndrome? no    Do you currently have an acute febrile illness? no    Have you ever had a severe reaction to latex? no    Vaccine information given and explained to patient? yes    Are you currently pregnant? no    Lot Number:[id number]   Exp Date:02/10/2011   Site Given  Left Deltoid IM   Physical Exam  General:  alert and overweight-appearing.   Head:  normocephalic and atraumatic.   Eyes:  pupils equal and pupils round.   Ears:  R ear normal and L ear normal.   Nose:  no external deformity and no nasal discharge.   Mouth:  good dentition and pharynx pink and moist.   Neck:  No deformities, masses, or tenderness noted. Breasts:  pendulos breast Lungs:  normal respiratory effort and no wheezes.   Heart:  normal rate and regular rhythm.   Abdomen:  soft, non-tender, and normal bowel sounds.   Extremities:  1+ left pedal edema and 1+ right pedal edema.   Neurologic:  alert &  oriented X3 and DTRs symmetrical and normal.     Impression & Recommendations:  Problem # 1:  DIAB W/OTH MANIFESTS TYPE II/UNS TYPE UNCNTRL (ICD-250.82) Assessment New  Her updated medication list for this problem includes:    Lotensin Hct 20-12.5 Mg Tabs (Benazepril-hydrochlorothiazide) ..... One by mouth daily  Labs Reviewed: Creat: 0.9 (04/04/2010)    Reviewed HgBA1c results: 6.6 (08/27/2008)  Problem # 2:  OBESITY (ICD-278.00)  Ht: 63 (04/10/2010)   Wt: 206 (04/10/2010)   BMI: 36.62 (04/10/2010)  Problem # 3:  HYPERLIPIDEMIA (ICD-272.4) Assessment: Deteriorated worsened Her updated medication list for this problem includes:    Crestor 10 Mg Tabs (Rosuvastatin calcium) ..... One by mouth daily    Welchol 3.75 Gm Pack (Colesevelam hcl) .Marland Kitchen... Disolve in water or jouice once a day ( 8oz)  Labs Reviewed: SGOT: 19 (04/04/2010)   SGPT: 15 (04/04/2010)  Lipid Goals: Chol Goal: 200 (06/11/2007)   HDL Goal: 40 (06/11/2007)   LDL Goal: 130 (06/11/2007)   TG Goal: 150 (06/11/2007)  Prior 10 Yr Risk Heart Disease: 17 % (05/25/2008)   HDL:29.20 (04/04/2010), 27.20 (05/06/2009)  LDL:106 (08/12/2008),  103 (02/11/2008)  Chol:156 (04/04/2010), 169 (05/06/2009)  Trig:221.0 (04/04/2010), 196 (08/12/2008)  Problem # 4:  HYPERTENSION (ICD-401.9) stable Her updated medication list for this problem includes:    Lotensin Hct 20-12.5 Mg Tabs (Benazepril-hydrochlorothiazide) ..... One by mouth daily  Orders: EKG w/ Interpretation (93000)  BP today: 110/70 Prior BP: 116/80 (11/09/2009)  Prior 10 Yr Risk Heart Disease: 17 % (05/25/2008)  Labs Reviewed: K+: 4.1 (04/04/2010) Creat: : 0.9 (04/04/2010)   Chol: 156 (04/04/2010)   HDL: 29.20 (04/04/2010)   LDL: 106 (08/12/2008)   TG: 221.0 (04/04/2010)  Problem # 5:  PREVENTIVE HEALTH CARE (ICD-V70.0) The pt was asked about all immunizations, health maint. services that are appropriate to their age and was given guidance on diet exercize  and  weight management  Td Booster: Tdap (04/10/2010)   Flu Vax: Fluvax 3+ (04/10/2010)   Chol: 156 (04/04/2010)   HDL: 29.20 (04/04/2010)   LDL: 106 (08/12/2008)   TG: 221.0 (04/04/2010) TSH: 0.20 (04/04/2010)   HgbA1C: 6.6 (08/27/2008)    Discussed using sunscreen, use of alcohol, drug use, self breast exam, routine dental care, routine eye care, schedule for GYN exam, routine physical exam, seat belts, multiple vitamins, osteoporosis prevention, adequate calcium intake in diet, recommendations for immunizations, mammograms and Pap smears.  Discussed exercise and checking cholesterol.  Discussed gun safety, safe sex, and contraception.  Complete Medication List: 1)  Valtrex 1 Gm Tabs (Valacyclovir hcl) .... As needed 2)  Lotensin Hct 20-12.5 Mg Tabs (Benazepril-hydrochlorothiazide) .... One by mouth daily 3)  Crestor 10 Mg Tabs (Rosuvastatin calcium) .... One by mouth daily 4)  Multivitamins Caps (Multiple vitamin) .Marland Kitchen.. 1 once daily 5)  Tylenol Pm Extra Strength 500-25 Mg Tabs (Diphenhydramine-apap (sleep)) .... Q hs as needed 6)  Pristiq 100 Mg Xr24h-tab (Desvenlafaxine succinate) .Marland Kitchen.. 1 once daily 7)  Ergocalciferol 50000 Unit Caps (Ergocalciferol) .... One by mouth weekly 8)  Alprazolam 0.5 Mg Tabs (Alprazolam) .... One by mouth three times a day as needed anxiety 9)  Abilify 5 Mg Tabs (Aripiprazole) .... One by mouth daily 10)  Welchol 3.75 Gm Pack (Colesevelam hcl) .... Disolve in water or jouice once a day ( 8oz)  Other Orders: Tdap => 28yrs IM (16109) Admin 1st Vaccine (60454) Admin 1st Vaccine (09811) Flu Vaccine 8yrs + (91478)  Patient Instructions: 1)  Please schedule a follow-up appointment in 1 month. 2)  HbgA1C prior to visit, ICD-9: 250.00   Immunizations Administered:  Tetanus Vaccine:    Vaccine Type: Tdap    Site: left deltoid    Mfr: GlaxoSmithKline    Dose: 0.5 ml    Route: IM    Given by: Willy Eddy, LPN    Exp. Date: 06/01/2012    Lot #: [id number]

## 2010-09-14 NOTE — Assessment & Plan Note (Deleted)
Summary: 3 month rov./nrj   Vital Signs:  Patient profile:   60 year old female Height:      63 inches Weight:      201 pounds BMI:     35.73 Temp:     98.2 degrees F oral Pulse rate:   76 / minute Resp:     14 per minute BP sitting:   130 / 80  (left arm)  Vitals Entered By: Willy Eddy, LPN (July 31, 2010 1:29 PM) CC: roa-cant afford welchol Is Patient Diabetic? No   Primary Care Calvin Chura:  Stacie Glaze MD  CC:  roa-cant afford welchol.  History of Present Illness: monitering of DM , lipids and depression  Follow-Up Visit      This is a 60 year old woman who presents for Follow-up visit.  The patient denies chest pain, palpitations, dizziness, syncope, low blood sugar symptoms, high blood sugar symptoms, edema, SOB, DOE, PND, and orthopnea.  Since the last visit the patient notes no new problems or concerns.  The patient reports taking meds as prescribed.  When questioned about possible medication side effects, the patient notes none.    Preventive Screening-Counseling & Management  Alcohol-Tobacco     Smoking Status: quit     Smoke Cessation Stage: ready     Tobacco Counseling: to remain off tobacco products  Problems Prior to Update: 1)  Preventive Health Care  (ICD-V70.0) 2)  Diab W/oth Manifests Type Ii/uns Type Uncntrl  (ICD-250.82) 3)  Osteopenia  (ICD-733.90) 4)  Uns Advrs Eff Uns Rx Medicinal&biological Sbstnc  (ICD-995.20) 5)  Family History Depression  (ICD-V17.0) 6)  Actinic Keratosis  (ICD-702.0) 7)  Colon Cancer, Hx of  (ICD-V10.05) 8)  Carpal Tunnel Syndrome  (ICD-354.0) 9)  Depression  (ICD-311) 10)  Obesity  (ICD-278.00) 11)  Hyperlipidemia  (ICD-272.4) 12)  Hypertension  (ICD-401.9)  Current Problems (verified): 1)  Preventive Health Care  (ICD-V70.0) 2)  Diab W/oth Manifests Type Ii/uns Type Uncntrl  (ICD-250.82) 3)  Osteopenia  (ICD-733.90) 4)  Uns Advrs Eff Uns Rx Medicinal&biological Sbstnc  (ICD-995.20) 5)  Family History  Depression  (ICD-V17.0) 6)  Actinic Keratosis  (ICD-702.0) 7)  Colon Cancer, Hx of  (ICD-V10.05) 8)  Carpal Tunnel Syndrome  (ICD-354.0) 9)  Depression  (ICD-311) 10)  Obesity  (ICD-278.00) 11)  Hyperlipidemia  (ICD-272.4) 12)  Hypertension  (ICD-401.9)  Medications Prior to Update: 1)  Valtrex 1 Gm Tabs (Valacyclovir Hcl) .... As Needed 2)  Lotensin Hct 20-12.5 Mg  Tabs (Benazepril-Hydrochlorothiazide) .... One By Mouth Daily 3)  Crestor 10 Mg Tabs (Rosuvastatin Calcium) .... One By Mouth Daily 4)  Multivitamins   Caps (Multiple Vitamin) .Marland Kitchen.. 1 Once Daily 5)  Tylenol Pm Extra Strength 500-25 Mg Tabs (Diphenhydramine-Apap (Sleep)) .... Q Hs As Needed 6)  Pristiq 100 Mg Xr24h-Tab (Desvenlafaxine Succinate) .Marland Kitchen.. 1 Once Daily 7)  Alprazolam 0.5 Mg Tabs (Alprazolam) .... One By Mouth Three Times A Day As Needed Anxiety 8)  Abilify 5 Mg Tabs (Aripiprazole) .... One By Mouth Daily 9)  Welchol 625 Mg Tabs (Colesevelam Hcl) .... Two By Mouth Three Times A Day  Current Medications (verified): 1)  Valtrex 1 Gm Tabs (Valacyclovir Hcl) .... As Needed 2)  Lotensin Hct 20-12.5 Mg  Tabs (Benazepril-Hydrochlorothiazide) .... One By Mouth Daily 3)  Crestor 10 Mg Tabs (Rosuvastatin Calcium) .... One By Mouth Daily 4)  Multivitamins   Caps (Multiple Vitamin) .Marland Kitchen.. 1 Once Daily 5)  Tylenol Pm Extra Strength 500-25 Mg Tabs (Diphenhydramine-Apap (  Sleep)) .... Q Hs As Needed 6)  Pristiq 100 Mg Xr24h-Tab (Desvenlafaxine Succinate) .Marland Kitchen.. 1 Once Daily 7)  Alprazolam 0.5 Mg Tabs (Alprazolam) .... One By Mouth Three Times A Day As Needed Anxiety 8)  Abilify 5 Mg Tabs (Aripiprazole) .... One By Mouth Daily 9)  Welchol 625 Mg Tabs (Colesevelam Hcl) .... Two By Mouth Three Times A Day  Allergies (verified): No Known Drug Allergies  Past History:  Family History: Last updated: 09/18/2007 Family History Depression Family History High cholesterol Family History Hypertension  Social History: Last updated:  06/11/2007 Married Never Smoked  Risk Factors: Smoking Status: quit (07/31/2010)  Past medical, surgical, family and social histories (including risk factors) reviewed, and no changes noted (except as noted below).  Past Medical History: Reviewed history from 03/04/2007 and no changes required. Hypertension Hyperlipidemia obesity Depression carpel tunnel syndromel Colon cancer, hx of  Past Surgical History: Reviewed history from 03/04/2007 and no changes required. Caesarean section x 3 Hysterectomy 1996  Family History: Reviewed history from 09/18/2007 and no changes required. Family History Depression Family History High cholesterol Family History Hypertension  Social History: Reviewed history from 06/11/2007 and no changes required. Married Never Smoked  Review of Systems  The patient denies anorexia, fever, weight loss, weight gain, vision loss, decreased hearing, hoarseness, chest pain, syncope, dyspnea on exertion, peripheral edema, prolonged cough, headaches, hemoptysis, abdominal pain, melena, hematochezia, severe indigestion/heartburn, hematuria, incontinence, genital sores, muscle weakness, suspicious skin lesions, transient blindness, difficulty walking, depression, unusual weight change, abnormal bleeding, enlarged lymph nodes, angioedema, and breast masses.    Physical Exam  General:  alert and overweight-appearing.   Head:  normocephalic and atraumatic.   Eyes:  pupils equal and pupils round.   Ears:  R ear normal and L ear normal.   Nose:  no external deformity and no nasal discharge.   Mouth:  good dentition and pharynx pink and moist.   Neck:  No deformities, masses, or tenderness noted. Lungs:  normal respiratory effort and no wheezes.   Heart:  normal rate and regular rhythm.     Impression & Recommendations:  Problem # 1:  DIAB W/OTH MANIFESTS TYPE II/UNS TYPE UNCNTRL (ICD-250.82)  Her updated medication list for this problem includes:     Lotensin Hct 20-12.5 Mg Tabs (Benazepril-hydrochlorothiazide) ..... One by mouth daily  Labs Reviewed: Creat: 0.9 (04/04/2010)    Reviewed HgBA1c results: 6.7 (05/01/2010)  6.6 (08/27/2008)  Problem # 2:  HYPERTENSION (ICD-401.9)  Her updated medication list for this problem includes:    Lotensin Hct 20-12.5 Mg Tabs (Benazepril-hydrochlorothiazide) ..... One by mouth daily  BP today: 130/80 Prior BP: 120/62 (05/08/2010)  Prior 10 Yr Risk Heart Disease: > 32 % (05/08/2010)  Labs Reviewed: K+: 4.1 (04/04/2010) Creat: : 0.9 (04/04/2010)   Chol: 156 (04/04/2010)   HDL: 29.20 (04/04/2010)   LDL: 106 (08/12/2008)   TG: 221.0 (04/04/2010)  Problem # 3:  DEPRESSION (ICD-311)  Her updated medication list for this problem includes:    Pristiq 100 Mg Xr24h-tab (Desvenlafaxine succinate) .Marland Kitchen... 1 once daily    Alprazolam 0.5 Mg Tabs (Alprazolam) ..... One by mouth three times a day as needed anxiety  Discussed treatment options, including trial of antidpressant medication. Will refer to behavioral health. Follow-up call in in 24-48 hours and recheck in 2 weeks, sooner as needed. Patient agrees to call if any worsening of symptoms or thoughts of doing harm arise. Verified that the patient has no suicidal ideation at this time.   Problem # 4:  OBESITY (ICD-278.00)  Ht: 63 (07/31/2010)   Wt: 201 (07/31/2010)   BMI: 35.73 (07/31/2010)  Complete Medication List: 1)  Valtrex 1 Gm Tabs (Valacyclovir hcl) .... As needed 2)  Lotensin Hct 20-12.5 Mg Tabs (Benazepril-hydrochlorothiazide) .... One by mouth daily 3)  Crestor 10 Mg Tabs (Rosuvastatin calcium) .... One by mouth daily 4)  Multivitamins Caps (Multiple vitamin) .Marland Kitchen.. 1 once daily 5)  Tylenol Pm Extra Strength 500-25 Mg Tabs (Diphenhydramine-apap (sleep)) .... Q hs as needed 6)  Pristiq 100 Mg Xr24h-tab (Desvenlafaxine succinate) .Marland Kitchen.. 1 once daily 7)  Alprazolam 0.5 Mg Tabs (Alprazolam) .... One by mouth three times a day as needed  anxiety 8)  Abilify 5 Mg Tabs (Aripiprazole) .... One by mouth daily 9)  Welchol 625 Mg Tabs (Colesevelam hcl) .... Two by mouth three times a day  Patient Instructions: 1)  Please schedule a follow-up appointment in 3 months.   Orders Added: 1)  Est. Patient Level IV [82956]

## 2010-09-20 NOTE — Progress Notes (Signed)
Summary: Pt having problems with Crestor  Phone Note Call from Patient Call back at Work Phone [phone] Call back at ask for Harriett Sine   Caller: Patient Reason for Call: Acute Illness Summary of Call: Pt believes she is allergic to Crestor. Pt has foot and arm pain in muscles. Pls call.  Initial call taken by: Lucy Antigua,  September 11, 2010 1:10 PM  Follow-up for Phone Call        pt instructed to stop for 1-2 weeks and let dr Lovell Sheehan know the results Follow-up by: Willy Eddy, LPN,  September 11, 2010 1:30 PM

## 2010-10-13 ENCOUNTER — Encounter: Payer: Self-pay | Admitting: Internal Medicine

## 2010-10-16 ENCOUNTER — Encounter: Payer: Self-pay | Admitting: Internal Medicine

## 2010-10-16 ENCOUNTER — Ambulatory Visit (INDEPENDENT_AMBULATORY_CARE_PROVIDER_SITE_OTHER): Payer: BC Managed Care – PPO | Admitting: Internal Medicine

## 2010-10-16 DIAGNOSIS — E669 Obesity, unspecified: Secondary | ICD-10-CM

## 2010-10-16 DIAGNOSIS — E1169 Type 2 diabetes mellitus with other specified complication: Secondary | ICD-10-CM

## 2010-10-16 DIAGNOSIS — IMO0002 Reserved for concepts with insufficient information to code with codable children: Secondary | ICD-10-CM

## 2010-10-16 DIAGNOSIS — R21 Rash and other nonspecific skin eruption: Secondary | ICD-10-CM

## 2010-10-16 DIAGNOSIS — F329 Major depressive disorder, single episode, unspecified: Secondary | ICD-10-CM

## 2010-10-16 DIAGNOSIS — E1165 Type 2 diabetes mellitus with hyperglycemia: Secondary | ICD-10-CM

## 2010-10-16 DIAGNOSIS — E785 Hyperlipidemia, unspecified: Secondary | ICD-10-CM

## 2010-10-16 MED ORDER — CLOTRIMAZOLE-BETAMETHASONE 1-0.05 % EX LOTN: TOPICAL_LOTION | Freq: Two times a day (BID) | CUTANEOUS | Status: AC

## 2010-10-16 MED ORDER — TERBINAFINE HCL 250 MG PO TABS: 250.0000 mg | ORAL_TABLET | Freq: Every day | ORAL | Status: AC

## 2010-10-16 NOTE — Assessment & Plan Note (Signed)
Involvement of skin and scalp Sebborhea? Fungal?

## 2010-10-16 NOTE — Assessment & Plan Note (Signed)
Poorly controlled.

## 2010-10-17 ENCOUNTER — Telehealth: Payer: Self-pay | Admitting: Internal Medicine

## 2010-10-17 NOTE — Telephone Encounter (Signed)
°  Pt called back to ck on status of request... Pt adv that she is itching really bad on head, arms, legs... Adv that Dr Lovell Sheehan saw same yesterday and she needs something for itch..... [facility].

## 2010-10-17 NOTE — Telephone Encounter (Signed)
Pt. Notified.

## 2010-10-17 NOTE — Telephone Encounter (Signed)
Pt called and said she had ov with Dr Lovell Sheehan yesterday, re: rash.Pt is itching really bad. Awake all night. Pt is req a med for itching. Pls call in to [facility] [address]

## 2010-10-17 NOTE — Telephone Encounter (Signed)
recommend OTC ketoconazole shampoo use daily for one week  Then twice a week

## 2010-10-18 ENCOUNTER — Telehealth: Payer: Self-pay | Admitting: Internal Medicine

## 2010-10-18 MED ORDER — HYDROXYZINE HCL 25 MG PO TABS: 25.0000 mg | ORAL_TABLET | Freq: Three times a day (TID) | ORAL | Status: DC | PRN

## 2010-10-18 NOTE — Telephone Encounter (Signed)
Pt called to adv that she has used the shampoo that she was told to use for itching but she is still itching on her arms and legs... Adv that Burnell Blanks is not helping ... Would like something called into pharmacy to help with itching....  [facility]... Pts c/b # [phone].

## 2010-10-18 NOTE — Telephone Encounter (Signed)
Atarax 25 tid prn number [phone]

## 2010-10-18 NOTE — Telephone Encounter (Signed)
Pt says rash is spreading all over her body. Pt req prednisone or diff med. Pt also wants to know results of a1c. Pls call. Pt uses [facility] [phone]

## 2010-10-18 NOTE — Telephone Encounter (Signed)
Pt called back again and is desperate to get an anti itch med called in asap. Pls call in to [facility] [phone].

## 2010-10-18 NOTE — Telephone Encounter (Signed)
Notifed pt

## 2010-10-19 ENCOUNTER — Emergency Department (HOSPITAL_COMMUNITY)
Admission: EM | Admit: 2010-10-19 | Discharge: 2010-10-20 | Disposition: A | Discharge status: Home / Self Care | Payer: BC Managed Care – PPO | Attending: Emergency Medicine | Admitting: Emergency Medicine

## 2010-10-19 DIAGNOSIS — Z79899 Other long term (current) drug therapy: Secondary | ICD-10-CM | POA: Insufficient documentation

## 2010-10-19 DIAGNOSIS — L2989 Other pruritus: Secondary | ICD-10-CM | POA: Insufficient documentation

## 2010-10-19 DIAGNOSIS — L298 Other pruritus: Secondary | ICD-10-CM | POA: Insufficient documentation

## 2010-10-19 DIAGNOSIS — R21 Rash and other nonspecific skin eruption: Secondary | ICD-10-CM | POA: Insufficient documentation

## 2010-10-19 DIAGNOSIS — L259 Unspecified contact dermatitis, unspecified cause: Secondary | ICD-10-CM | POA: Insufficient documentation

## 2010-10-19 DIAGNOSIS — I10 Essential (primary) hypertension: Secondary | ICD-10-CM | POA: Insufficient documentation

## 2010-10-19 LAB — PROTIME-INR: INR: 0.97 (ref 0.00–1.49)

## 2010-10-19 LAB — DIFFERENTIAL
Basophils Absolute: 0.1 10*3/uL (ref 0.0–0.1)
Eosinophils Relative: 7 % — ABNORMAL HIGH (ref 0–5)
Lymphocytes Relative: 30 % (ref 12–46)
Lymphs Abs: 3.8 10*3/uL (ref 0.7–4.0)
Monocytes Absolute: 0.6 10*3/uL (ref 0.1–1.0)
Neutro Abs: 7.5 10*3/uL (ref 1.7–7.7)

## 2010-10-19 LAB — POCT I-STAT, CHEM 8
Calcium, Ion: 1.09 mmol/L — ABNORMAL LOW (ref 1.12–1.32)
Chloride: 105 mEq/L (ref 96–112)
Creatinine, Ser: 1 mg/dL (ref 0.4–1.2)
Glucose, Bld: 123 mg/dL — ABNORMAL HIGH (ref 70–99)
Potassium: 3.9 mEq/L (ref 3.5–5.1)

## 2010-10-19 LAB — CBC
HCT: 42 % (ref 36.0–46.0)
Hemoglobin: 14.3 g/dL (ref 12.0–15.0)
MCHC: 34 g/dL (ref 30.0–36.0)
MCV: 86.2 fL (ref 78.0–100.0)
WBC: 12.7 10*3/uL — ABNORMAL HIGH (ref 4.0–10.5)

## 2010-10-19 LAB — APTT: aPTT: 28 seconds (ref 24–37)

## 2010-10-20 ENCOUNTER — Telehealth: Payer: Self-pay | Admitting: *Deleted

## 2010-10-20 NOTE — Telephone Encounter (Signed)
[facility] Triage Call Report Triage Record Num: [id number] Operator: Durward Mallard DiMatteis Patient Name: Jenny Cardenas Call Date & Time: 10/19/2010 8:35:48PM Patient Phone: [phone] PCP: Darryll Capers Patient Gender: Female PCP Fax : [phone] Patient DOB: [birth year] [facility] Name: [facility] Reason for Call: Herbert Seta, daughter calling and states that pt startedwith a rash on Monday that started on base of neck and then spread to arms and legs; being teated with antifungal cream since Monday; lotrisone cream and lamasil and hydroxyzine HCL; and not helping; she is not sleeping; she is still scratching until she bleeds and the rash is not down her legs; the rash looks liike raised red bumps; no drainage; advised to be seen in next 4 hr per rash guideline; daughter will cmply and go to [facility] now Protocol(s) Used: Rash Recommended Outcome per Protocol: See Provider within 4 hours Reason for Outcome: Generalized rash AND not responding to 8 hours of home care Care Advice: ~ Another adult should drive. ~ SYMPTOM / CONDITION MANAGEMENT 10/19/2010 8:58:47PM Page 1 of 1 [email or web address]

## 2010-10-24 ENCOUNTER — Telehealth: Payer: Self-pay | Admitting: *Deleted

## 2010-10-24 NOTE — Telephone Encounter (Signed)
Pt has seen Dr. Lovell Sheehan and ER for a rash.  Calls asking for appt with Dr. Lovell Sheehan.  Per Dr. Lovell Sheehan, send to [facility].  She will make her own appt.

## 2010-12-04 ENCOUNTER — Other Ambulatory Visit: Payer: Self-pay | Admitting: *Deleted

## 2010-12-04 MED ORDER — ALPRAZOLAM 0.5 MG PO TABS
0.5000 mg | ORAL_TABLET | Freq: Three times a day (TID) | ORAL | Status: DC | PRN
Start: 2010-12-04 — End: 2012-03-20

## 2010-12-25 ENCOUNTER — Telehealth: Payer: Self-pay | Admitting: *Deleted

## 2010-12-25 NOTE — Telephone Encounter (Signed)
Pt informed- had already stopped crestor and didn't help so now will stop abilify

## 2010-12-25 NOTE — Telephone Encounter (Signed)
Pt is seeing Dr. Terri Piedra for a skin rash, and he diagnosed with hypersensitivity to one of her newer meds, and is asking Dr. Lovell Sheehan to look at her meds and tell her what to do.

## 2010-12-25 NOTE — Telephone Encounter (Signed)
It will have to be a process of elimination I would stop the Crestor for then the rash does not improve after 2 weeks I would stop the Abilify as my second choice

## 2011-01-15 ENCOUNTER — Ambulatory Visit (INDEPENDENT_AMBULATORY_CARE_PROVIDER_SITE_OTHER): Payer: BC Managed Care – PPO | Admitting: Internal Medicine

## 2011-01-15 ENCOUNTER — Encounter: Payer: Self-pay | Admitting: Internal Medicine

## 2011-01-15 VITALS — BP 132/80 | HR 76 | Temp 98.2°F | Resp 16 | Ht 63.0 in | Wt 199.0 lb

## 2011-01-15 DIAGNOSIS — R21 Rash and other nonspecific skin eruption: Secondary | ICD-10-CM

## 2011-01-15 DIAGNOSIS — E1165 Type 2 diabetes mellitus with hyperglycemia: Secondary | ICD-10-CM

## 2011-01-15 DIAGNOSIS — I10 Essential (primary) hypertension: Secondary | ICD-10-CM

## 2011-01-15 DIAGNOSIS — E785 Hyperlipidemia, unspecified: Secondary | ICD-10-CM

## 2011-01-15 DIAGNOSIS — E1169 Type 2 diabetes mellitus with other specified complication: Secondary | ICD-10-CM

## 2011-01-15 NOTE — Assessment & Plan Note (Signed)
Diet controlled.  

## 2011-01-15 NOTE — Progress Notes (Signed)
  Subjective:    Patient ID: Jenny Cardenas, female    DOB: [birth year], 60 y.o.   MRN: [id number]  HPI patient has severe allergic skin reaction of unknown etiology is most likely an ingredient in that the Abilify was the culprit but we cannot be sure she remains off many of her medications we are going to resume them one at a time. We'll begin with the Crestor Then we will take the WelChol second after she's been on Crestor for at least 2 weeks and rash free   Review of Systems  Constitutional: Negative for activity change, appetite change and fatigue.  HENT: Negative for ear pain, congestion, neck pain, postnasal drip and sinus pressure.   Eyes: Negative for redness and visual disturbance.  Respiratory: Negative for cough, shortness of breath and wheezing.   Gastrointestinal: Negative for abdominal pain and abdominal distention.  Genitourinary: Negative for dysuria, frequency and menstrual problem.  Musculoskeletal: Negative for myalgias, joint swelling and arthralgias.  Skin: Negative for rash and wound.  Neurological: Negative for dizziness, weakness and headaches.  Hematological: Negative for adenopathy. Does not bruise/bleed easily.  Psychiatric/Behavioral: Negative for sleep disturbance and decreased concentration.   Past Medical History  Diagnosis Date  . Hypertension   . Hyperlipidemia   . Obesity   . Depression   . Carpal tunnel syndrome    Past Surgical History  Procedure Date  . Cesarean section   . Abdominal hysterectomy     reports that she has been smoking.  She quit smokeless tobacco use about 4 months ago. She reports that she does not drink alcohol or use illicit drugs. family history includes Alzheimer's disease in her father; COPD in her mother; Depression in her mother; Heart disease in her father; Hyperlipidemia in her mother; and Hypertension in her mother. Allergies  Allergen Reactions  . Abilify Rash       Objective:   Physical Exam  Constitutional:  She is oriented to person, place, and time. She appears well-developed and well-nourished. No distress.  HENT:  Head: Normocephalic and atraumatic.  Right Ear: External ear normal.  Left Ear: External ear normal.  Nose: Nose normal.  Mouth/Throat: Oropharynx is clear and moist.  Eyes: Conjunctivae and EOM are normal. Pupils are equal, round, and reactive to light.  Neck: Normal range of motion. Neck supple. No JVD present. No tracheal deviation present. No thyromegaly present.  Cardiovascular: Normal rate, regular rhythm, normal heart sounds and intact distal pulses.   No murmur heard. Pulmonary/Chest: Effort normal and breath sounds normal. She has no wheezes. She exhibits no tenderness.  Abdominal: Soft. Bowel sounds are normal.  Musculoskeletal: Normal range of motion. She exhibits no edema and no tenderness.  Lymphadenopathy:    She has no cervical adenopathy.  Neurological: She is alert and oriented to person, place, and time. She has normal reflexes. No cranial nerve deficit.  Skin: Skin is warm and dry. She is not diaphoretic.  Psychiatric: She has a normal mood and affect. Her behavior is normal.          Assessment & Plan:  Patient has a resolution of her rash the Abilify as a suspected agent.  We need to restart treatment of her cholesterol and will start one drug at a time again with the Crestor and after 2 weeks she has no reaction then she will add back the WelChol.  We'll followup in 3 months with a lipid and liver prior blood pressure is well controlled currently

## 2011-02-26 ENCOUNTER — Other Ambulatory Visit: Payer: Self-pay | Admitting: *Deleted

## 2011-02-26 MED ORDER — BENAZEPRIL-HYDROCHLOROTHIAZIDE 20-12.5 MG PO TABS: 1.0000 | ORAL_TABLET | Freq: Every day | ORAL | Status: DC

## 2011-03-19 ENCOUNTER — Other Ambulatory Visit: Payer: Self-pay | Admitting: *Deleted

## 2011-03-19 MED ORDER — VALACYCLOVIR HCL 1 G PO TABS: 1000.0000 mg | ORAL_TABLET | ORAL | Status: DC | PRN

## 2011-04-17 ENCOUNTER — Other Ambulatory Visit (INDEPENDENT_AMBULATORY_CARE_PROVIDER_SITE_OTHER): Payer: BC Managed Care – PPO

## 2011-04-17 DIAGNOSIS — E785 Hyperlipidemia, unspecified: Secondary | ICD-10-CM

## 2011-04-17 LAB — LIPID PANEL
HDL: 37.9 mg/dL — ABNORMAL LOW (ref >39.00)
Total CHOL/HDL Ratio: 3
Triglycerides: 135 mg/dL (ref 0.0–149.0)

## 2011-04-17 LAB — HEPATIC FUNCTION PANEL
Albumin: 3.6 g/dL (ref 3.5–5.2)
Bilirubin, Direct: 0 mg/dL (ref 0.0–0.3)
Total Protein: 6.4 g/dL (ref 6.0–8.3)

## 2011-04-24 ENCOUNTER — Ambulatory Visit (INDEPENDENT_AMBULATORY_CARE_PROVIDER_SITE_OTHER): Payer: BC Managed Care – PPO | Admitting: Internal Medicine

## 2011-04-24 ENCOUNTER — Encounter: Payer: Self-pay | Admitting: Internal Medicine

## 2011-04-24 VITALS — BP 128/82 | HR 105 | Temp 98.3°F | Wt 204.0 lb

## 2011-04-24 DIAGNOSIS — T887XXA Unspecified adverse effect of drug or medicament, initial encounter: Secondary | ICD-10-CM

## 2011-04-24 DIAGNOSIS — F32A Depression, unspecified: Secondary | ICD-10-CM

## 2011-04-24 DIAGNOSIS — F329 Major depressive disorder, single episode, unspecified: Secondary | ICD-10-CM

## 2011-04-24 DIAGNOSIS — L439 Lichen planus, unspecified: Secondary | ICD-10-CM

## 2011-04-24 MED ORDER — QUETIAPINE FUMARATE 50 MG PO TABS: 25.0000 mg | ORAL_TABLET | Freq: Every day | ORAL | Status: DC

## 2011-04-24 MED ORDER — BETAMETHASONE DIPROPIONATE 0.05 % EX CREA: TOPICAL_CREAM | Freq: Two times a day (BID) | CUTANEOUS | Status: DC

## 2011-04-24 NOTE — Progress Notes (Signed)
Subjective:    Patient ID: Jenny Cardenas, female    DOB: [birth year], 60 y.o.   MRN: [id number]  HPI The excoriations on the legs still persist and did not improve with the cessation of the abilify.She has noted increased pressures off the abilify with mood and tearfulness. Increased since of fatigue and decreased functioning The rash on the legs appears to be anxiety driven ( lichen chronicus)   Review of Systems  Constitutional: Negative for activity change, appetite change and fatigue.  HENT: Negative for ear pain, congestion, neck pain, postnasal drip and sinus pressure.   Eyes: Negative for redness and visual disturbance.  Respiratory: Negative for cough, shortness of breath and wheezing.   Gastrointestinal: Negative for abdominal pain and abdominal distention.  Genitourinary: Negative for dysuria, frequency and menstrual problem.  Musculoskeletal: Negative for myalgias, joint swelling and arthralgias.  Skin: Positive for rash. Negative for wound.  Neurological: Positive for weakness. Negative for dizziness and headaches.  Hematological: Negative for adenopathy. Does not bruise/bleed easily.  Psychiatric/Behavioral: Positive for decreased concentration and agitation. Negative for sleep disturbance. The patient is nervous/anxious.    Past Medical History  Diagnosis Date  . Hypertension   . Hyperlipidemia   . Obesity   . Depression   . Carpal tunnel syndrome    Past Surgical History  Procedure Date  . Cesarean section   . Abdominal hysterectomy     reports that she has quit smoking. She quit smokeless tobacco use about 8 months ago. She reports that she drinks alcohol. She reports that she does not use illicit drugs. family history includes Alzheimer's disease in her father; COPD in her mother; Depression in her mother; Heart disease in her father; Hyperlipidemia in her mother; and Hypertension in her mother. Allergies  Allergen Reactions  . Abilify Rash       Objective:    Physical Exam  Constitutional: She is oriented to person, place, and time. She appears well-developed and well-nourished. No distress.  HENT:  Head: Normocephalic and atraumatic.  Right Ear: External ear normal.  Left Ear: External ear normal.  Nose: Nose normal.  Mouth/Throat: Oropharynx is clear and moist.  Eyes: Conjunctivae and EOM are normal. Pupils are equal, round, and reactive to light.  Neck: Normal range of motion. Neck supple. No JVD present. No tracheal deviation present. No thyromegaly present.  Cardiovascular: Normal rate, regular rhythm, normal heart sounds and intact distal pulses.   No murmur heard. Pulmonary/Chest: Effort normal and breath sounds normal. She has no wheezes. She exhibits no tenderness.  Abdominal: Soft. Bowel sounds are normal.  Musculoskeletal: Normal range of motion. She exhibits no edema and no tenderness.  Lymphadenopathy:    She has no cervical adenopathy.  Neurological: She is alert and oriented to person, place, and time. She has normal reflexes. No cranial nerve deficit.  Skin: Skin is warm and dry. Rash noted. She is not diaphoretic.  Psychiatric: She has a normal mood and affect. Her behavior is normal.    BP 128/82  Pulse 105  Temp(Src) 98.3 F (36.8 C) (Oral)  Wt 204 lb (92.534 kg)  SpO2 98%       Assessment & Plan:  Rash of the legs   Apply sarna lotion and steroid cream mixed 5050 twice daily *Will 25 mg by mouth each bedtime for sleep and also for the paranoid component of her depression.  She's been on the Abilify with the current medication with good results in the past but she states she can  no longer afford the Abilify as it was both expensive and implicated a rash I believe the rash is truly an anxiety-related lichen type rash chronic itching and not due to her medications.  We will use Seroquel as a more cost effective replacement and also aid in sleep monitor her diabetes and weight closely on this medication for her  back in 6 weeks

## 2011-04-26 ENCOUNTER — Other Ambulatory Visit: Payer: Self-pay | Admitting: *Deleted

## 2011-04-26 MED ORDER — ROSUVASTATIN CALCIUM 10 MG PO TABS: 10.0000 mg | ORAL_TABLET | Freq: Every day | ORAL | Status: DC

## 2011-05-15 ENCOUNTER — Other Ambulatory Visit: Payer: Self-pay

## 2011-05-15 ENCOUNTER — Other Ambulatory Visit: Payer: Self-pay | Admitting: Internal Medicine

## 2011-05-15 DIAGNOSIS — Z1231 Encounter for screening mammogram for malignant neoplasm of breast: Secondary | ICD-10-CM

## 2011-05-16 ENCOUNTER — Ambulatory Visit
Admission: RE | Admit: 2011-05-16 | Discharge: 2011-05-16 | Disposition: A | Discharge status: Home / Self Care | Payer: BC Managed Care – PPO | Source: Ambulatory Visit | Attending: Internal Medicine | Admitting: Internal Medicine

## 2011-05-16 DIAGNOSIS — Z1231 Encounter for screening mammogram for malignant neoplasm of breast: Secondary | ICD-10-CM

## 2011-06-08 ENCOUNTER — Telehealth: Payer: Self-pay | Admitting: Internal Medicine

## 2011-06-08 NOTE — Telephone Encounter (Signed)
thanks

## 2011-06-08 NOTE — Telephone Encounter (Signed)
Ok to move moms Monday appointment to be with daughter

## 2011-06-08 NOTE — Telephone Encounter (Signed)
Pt is scheduled for an appt on 11/2 and said her mother normally comes at the same time and they go in he room together. Can her mother DOB [birth year]/16/25 Alesia Banda Be worked in that say and time as well?  Pt requesting more samples of Seroguel. Please contact

## 2011-06-08 NOTE — Telephone Encounter (Signed)
Pt requesting you contact her regarding the seroquel smples

## 2011-06-15 ENCOUNTER — Encounter: Payer: Self-pay | Admitting: Internal Medicine

## 2011-06-15 ENCOUNTER — Ambulatory Visit (INDEPENDENT_AMBULATORY_CARE_PROVIDER_SITE_OTHER): Payer: BC Managed Care – PPO | Admitting: Internal Medicine

## 2011-06-15 VITALS — BP 120/78 | HR 76 | Temp 98.4°F | Resp 16 | Ht 63.0 in | Wt 204.0 lb

## 2011-06-15 DIAGNOSIS — I10 Essential (primary) hypertension: Secondary | ICD-10-CM

## 2011-06-15 DIAGNOSIS — E1169 Type 2 diabetes mellitus with other specified complication: Secondary | ICD-10-CM

## 2011-06-15 DIAGNOSIS — E1165 Type 2 diabetes mellitus with hyperglycemia: Secondary | ICD-10-CM

## 2011-06-15 DIAGNOSIS — E785 Hyperlipidemia, unspecified: Secondary | ICD-10-CM

## 2011-06-15 NOTE — Patient Instructions (Addendum)
The patient is instructed to continue all medications as prescribed. Schedule followup with check out clerk upon leaving the clinic  

## 2011-07-10 ENCOUNTER — Telehealth: Payer: Self-pay | Admitting: Internal Medicine

## 2011-07-10 NOTE — Telephone Encounter (Signed)
Pt fell down a flight of stairs yesterday and has back pain. Please advise patient.

## 2011-07-10 NOTE — Telephone Encounter (Signed)
Pt decided to go to urgent care.

## 2011-07-30 ENCOUNTER — Other Ambulatory Visit: Payer: Self-pay | Admitting: *Deleted

## 2011-07-30 MED ORDER — DESVENLAFAXINE SUCCINATE ER 100 MG PO TB24: 100.0000 mg | ORAL_TABLET | Freq: Every day | ORAL | Status: DC

## 2011-08-14 DIAGNOSIS — I5181 Takotsubo syndrome: Secondary | ICD-10-CM

## 2011-08-14 HISTORY — DX: Takotsubo syndrome: I51.81

## 2011-09-18 ENCOUNTER — Encounter: Payer: Self-pay | Admitting: Internal Medicine

## 2011-09-18 ENCOUNTER — Ambulatory Visit (INDEPENDENT_AMBULATORY_CARE_PROVIDER_SITE_OTHER): Payer: BC Managed Care – PPO | Admitting: Internal Medicine

## 2011-09-18 VITALS — BP 130/82 | HR 76 | Temp 98.1°F | Resp 16 | Ht 63.0 in | Wt 200.0 lb

## 2011-09-18 DIAGNOSIS — E785 Hyperlipidemia, unspecified: Secondary | ICD-10-CM

## 2011-09-18 DIAGNOSIS — I10 Essential (primary) hypertension: Secondary | ICD-10-CM

## 2011-09-18 DIAGNOSIS — T887XXA Unspecified adverse effect of drug or medicament, initial encounter: Secondary | ICD-10-CM

## 2011-09-18 DIAGNOSIS — E1165 Type 2 diabetes mellitus with hyperglycemia: Secondary | ICD-10-CM

## 2011-09-18 DIAGNOSIS — E669 Obesity, unspecified: Secondary | ICD-10-CM

## 2011-09-18 LAB — HEMOGLOBIN A1C: Hgb A1c MFr Bld: 6.8 % — ABNORMAL HIGH (ref 4.6–6.5)

## 2011-09-18 LAB — HEPATIC FUNCTION PANEL
ALT: 20 U/L (ref 0–35)
AST: 23 U/L (ref 0–37)
Albumin: 3.8 g/dL (ref 3.5–5.2)
Alkaline Phosphatase: 83 U/L (ref 39–117)
Total Protein: 6.9 g/dL (ref 6.0–8.3)

## 2011-09-18 NOTE — Progress Notes (Signed)
Subjective:    Patient ID: Jenny Cardenas, female    DOB: Jan 15, 1951, 61 y.o.   MRN: [id number]  HPI Weight loss noted Blood pressure stable Mood stable off the seroquil Does not monitor DM   Review of Systems  Constitutional: Negative for activity change, appetite change and fatigue.  HENT: Negative for ear pain, congestion, neck pain, postnasal drip and sinus pressure.   Eyes: Negative for redness and visual disturbance.  Respiratory: Negative for cough, shortness of breath and wheezing.   Gastrointestinal: Negative for abdominal pain and abdominal distention.  Genitourinary: Negative for dysuria, frequency and menstrual problem.  Musculoskeletal: Negative for myalgias, joint swelling and arthralgias.  Skin: Negative for rash and wound.  Neurological: Negative for dizziness, weakness and headaches.  Hematological: Negative for adenopathy. Does not bruise/bleed easily.  Psychiatric/Behavioral: Negative for sleep disturbance and decreased concentration.   Past Medical History  Diagnosis Date  . Hypertension   . Hyperlipidemia   . Obesity   . Depression   . Carpal tunnel syndrome     History   Social History  . Marital Status: Widowed    Spouse Name: N/A    Number of Children: N/A  . Years of Education: N/A   Occupational History  . Not on file.   Social History Main Topics  . Smoking status: Former Games developer  . Smokeless tobacco: Former Neurosurgeon    Quit date: 08/17/2010   Comment: smokes about 4 cigs per day  . Alcohol Use: Yes     occ. glass of wine   . Drug Use: No  . Sexually Active: Not Currently   Other Topics Concern  . Not on file   Social History Narrative  . No narrative on file    Past Surgical History  Procedure Date  . Cesarean section   . Abdominal hysterectomy     Family History  Problem Relation Age of Onset  . COPD Mother   . Depression Mother   . Hypertension Mother   . Hyperlipidemia Mother   . Alzheimer's disease Father   . Heart  disease Father     No Known Allergies  Current Outpatient Prescriptions on File Prior to Visit  Medication Sig Dispense Refill  . ALPRAZolam (XANAX) 0.5 MG tablet Take 1 tablet (0.5 mg total) by mouth 3 (three) times daily as needed.  90 tablet  3  . benazepril-hydrochlorthiazide (LOTENSIN HCT) 20-12.5 MG per tablet Take 1 tablet by mouth daily.  30 tablet  11  . betamethasone dipropionate (DIPROLENE) 0.05 % cream Apply topically 2 (two) times daily. Next 50-50 with a Sarna lotion and apply twice daily  45 g  6  . clotrimazole-betamethasone (LOTRISONE) lotion Apply topically 2 (two) times daily.  60 mL  3  . colesevelam (WELCHOL) 625 MG tablet Take 1,250 mg by mouth 2 (two) times daily with a meal.        . desvenlafaxine (PRISTIQ) 100 MG 24 hr tablet Take 1 tablet (100 mg total) by mouth daily.  30 tablet  6  . diphenhydramine-acetaminophen (TYLENOL PM) 25-500 MG TABS Take 1 tablet by mouth at bedtime as needed.        . Multiple Vitamin (MULTIVITAMIN) tablet Take 1 tablet by mouth daily.        . valACYclovir (VALTREX) 1000 MG tablet Take 1 tablet (1,000 mg total) by mouth as needed.  30 tablet  0  . QUEtiapine (SEROQUEL) 50 MG tablet Take 25 mg by mouth at bedtime.        Marland Kitchen  rosuvastatin (CRESTOR) 10 MG tablet Take 1 tablet (10 mg total) by mouth daily.  30 tablet  11    BP 130/82  Pulse 76  Temp 98.1 F (36.7 C)  Resp 16  Ht 5\' 3"  (1.6 m)  Wt 200 lb (90.719 kg)  BMI 35.43 kg/m2        Objective:   Physical Exam  Nursing note and vitals reviewed. Constitutional: She is oriented to person, place, and time. She appears well-developed and well-nourished. No distress.  HENT:  Head: Normocephalic and atraumatic.  Right Ear: External ear normal.  Left Ear: External ear normal.  Nose: Nose normal.  Mouth/Throat: Oropharynx is clear and moist.  Eyes: Conjunctivae and EOM are normal. Pupils are equal, round, and reactive to light.  Neck: Normal range of motion. Neck supple. No  JVD present. No tracheal deviation present. No thyromegaly present.  Cardiovascular: Normal rate, regular rhythm, normal heart sounds and intact distal pulses.   No murmur heard. Pulmonary/Chest: Effort normal and breath sounds normal. She has no wheezes. She exhibits no tenderness.  Abdominal: Soft. Bowel sounds are normal.  Musculoskeletal: Normal range of motion. She exhibits no edema and no tenderness.  Lymphadenopathy:    She has no cervical adenopathy.  Neurological: She is alert and oriented to person, place, and time. She has normal reflexes. No cranial nerve deficit.  Skin: Skin is warm and dry. She is not diaphoretic.  Psychiatric: She has a normal mood and affect. Her behavior is normal.          Assessment & Plan:  Patient presents today for monitoring of her diabetes we will get an A1c and basic metabolic panel.  She has lost an additional 4 pounds since her last office visit as moving in the right direction we emphasize continued weight loss and exercise is key to controlling her cholesterol diabetes her cardiovascular risk factors  reviewed medications Was seen at [facility] on [address] and was given [place] and xray  .Marland Kitchen.for fall

## 2011-09-18 NOTE — Patient Instructions (Signed)
The patient is instructed to continue all medications as prescribed. Schedule followup with check out clerk upon leaving the clinic  

## 2011-09-19 LAB — LDL CHOLESTEROL, DIRECT: Direct LDL: 124.5 mg/dL

## 2011-11-13 NOTE — Progress Notes (Signed)
  Subjective:    Patient ID: Jenny Cardenas, female    DOB: 1950/11/11, 61 y.o.   MRN: [id number]  HPI The patient is a 61 year old white female followed for depression hypertension and hyperlipidemia as well as adult-onset diabetes that has been uncontrolled due to her inability to control her weight and diet.  I believe that these are related to her diagnosis of depression   Review of Systems  Constitutional: Negative for activity change, appetite change and fatigue.  HENT: Negative for ear pain, congestion, neck pain, postnasal drip and sinus pressure.   Eyes: Negative for redness and visual disturbance.  Respiratory: Negative for cough, shortness of breath and wheezing.   Gastrointestinal: Negative for abdominal pain and abdominal distention.  Genitourinary: Negative for dysuria, frequency and menstrual problem.  Musculoskeletal: Negative for myalgias, joint swelling and arthralgias.  Skin: Negative for rash and wound.  Neurological: Negative for dizziness, weakness and headaches.  Hematological: Negative for adenopathy. Does not bruise/bleed easily.  Psychiatric/Behavioral: Negative for sleep disturbance and decreased concentration.       Objective:   Physical Exam  Nursing note and vitals reviewed. Constitutional: She is oriented to person, place, and time. She appears well-developed and well-nourished. No distress.  HENT:  Head: Normocephalic and atraumatic.  Right Ear: External ear normal.  Left Ear: External ear normal.  Nose: Nose normal.  Mouth/Throat: Oropharynx is clear and moist.  Eyes: Conjunctivae and EOM are normal. Pupils are equal, round, and reactive to light.  Neck: Normal range of motion. Neck supple. No JVD present. No tracheal deviation present. No thyromegaly present.  Cardiovascular: Normal rate, regular rhythm, normal heart sounds and intact distal pulses.   No murmur heard. Pulmonary/Chest: Effort normal and breath sounds normal. She has no wheezes. She  exhibits no tenderness.  Abdominal: Soft. Bowel sounds are normal.  Musculoskeletal: Normal range of motion. She exhibits no edema and no tenderness.  Lymphadenopathy:    She has no cervical adenopathy.  Neurological: She is alert and oriented to person, place, and time. She has normal reflexes. No cranial nerve deficit.  Skin: Skin is warm and dry. She is not diaphoretic.  Psychiatric: She has a normal mood and affect. Her behavior is normal.          Assessment & Plan:  We have reduced the Seroquel due to its effect on weight gain and have given her samples of her pristiq to aid in compliance with her antihypertensive regimen.  Her blood pressure stable on her current medications Monitor hemoglobin A1c and have reviewed her diet and weight loss goals.

## 2011-12-07 ENCOUNTER — Telehealth: Payer: Self-pay | Admitting: Internal Medicine

## 2011-12-07 NOTE — Telephone Encounter (Signed)
Pt is trying to setup insurance with [employer] and is being declined coverage because her chart states that she has a history of colon cancer. Pt states that she has never had cancer and is requesting her chart be corrected. Pt is also requesting to have a letter sent to [facility] [place] stating her chart was incorrect. Once chart is corrected pt will also need her updated chart to be sent.  [employer] [phone]

## 2011-12-11 NOTE — Telephone Encounter (Signed)
Pt faxed a signed paper stating she has never had colon cancer no does she have history of colon cancer--dr jenkins also signed stating she has never had colon cancer- was sent to [facility] to send to appropriate department to adjust her problems list

## 2011-12-12 NOTE — Telephone Encounter (Signed)
Pt requesting signed letter stating that she does not have colon cancer  Be faxed to the Attn of sha'ron Caryn [employer] of [employer]  Fax Number [phone] Pt also requesting to be contacted about how they will go about updating her chart and about how long the process will takr Please contact pt at [phone]

## 2011-12-13 NOTE — Telephone Encounter (Signed)
Thank you for taking care of this.

## 2011-12-13 NOTE — Telephone Encounter (Signed)
I have the letter template and will adjust to fit the pt's situation. Once completed and signed, I will fax. Pt is aware of this, and I will call her back once everything is sent off.

## 2011-12-14 NOTE — Telephone Encounter (Signed)
Jenny Cardenas,  The letters have been sent to both [facility] of [place]. A copy of it has been to scan for her chart.

## 2011-12-14 NOTE — Telephone Encounter (Signed)
Thanks again. I received her chart (paper) yesterday and looked all through it-  I saw no mention of any problems with the colon.  I dont know how that got in there.

## 2012-01-16 ENCOUNTER — Ambulatory Visit (INDEPENDENT_AMBULATORY_CARE_PROVIDER_SITE_OTHER): Payer: BC Managed Care – PPO | Admitting: Internal Medicine

## 2012-01-16 ENCOUNTER — Encounter: Payer: Self-pay | Admitting: Internal Medicine

## 2012-01-16 VITALS — BP 120/70 | HR 76 | Temp 98.2°F | Resp 16 | Ht 63.0 in | Wt 204.0 lb

## 2012-01-16 DIAGNOSIS — I10 Essential (primary) hypertension: Secondary | ICD-10-CM

## 2012-01-16 DIAGNOSIS — E559 Vitamin D deficiency, unspecified: Secondary | ICD-10-CM

## 2012-01-16 DIAGNOSIS — E669 Obesity, unspecified: Secondary | ICD-10-CM

## 2012-01-16 DIAGNOSIS — E785 Hyperlipidemia, unspecified: Secondary | ICD-10-CM

## 2012-01-16 MED ORDER — VITAMIN D (ERGOCALCIFEROL) 1.25 MG (50000 UNIT) PO CAPS: 50000.0000 [IU] | ORAL_CAPSULE | ORAL | Status: DC

## 2012-01-16 MED ORDER — BETAMETHASONE DIPROPIONATE 0.05 % EX CREA: TOPICAL_CREAM | Freq: Two times a day (BID) | CUTANEOUS | Status: AC

## 2012-01-16 NOTE — Patient Instructions (Signed)
Practical paleo 

## 2012-03-20 ENCOUNTER — Other Ambulatory Visit: Payer: Self-pay | Admitting: *Deleted

## 2012-03-20 MED ORDER — ALPRAZOLAM 0.5 MG PO TABS
0.5000 mg | ORAL_TABLET | Freq: Three times a day (TID) | ORAL | Status: DC | PRN
Start: 2012-03-20 — End: 2012-12-30

## 2012-03-24 ENCOUNTER — Other Ambulatory Visit: Payer: Self-pay | Admitting: *Deleted

## 2012-03-24 MED ORDER — BENAZEPRIL-HYDROCHLOROTHIAZIDE 20-12.5 MG PO TABS: 1.0000 | ORAL_TABLET | Freq: Every day | ORAL | Status: DC

## 2012-03-24 MED ORDER — VALACYCLOVIR HCL 1 G PO TABS: 1000.0000 mg | ORAL_TABLET | ORAL | Status: DC | PRN

## 2012-04-21 ENCOUNTER — Encounter: Payer: Self-pay | Admitting: Internal Medicine

## 2012-04-21 ENCOUNTER — Ambulatory Visit (INDEPENDENT_AMBULATORY_CARE_PROVIDER_SITE_OTHER): Payer: BC Managed Care – PPO | Admitting: Internal Medicine

## 2012-04-21 VITALS — BP 120/80 | HR 72 | Temp 98.2°F | Resp 16 | Ht 63.0 in | Wt 206.0 lb

## 2012-04-21 DIAGNOSIS — F32A Depression, unspecified: Secondary | ICD-10-CM

## 2012-04-21 DIAGNOSIS — Z23 Encounter for immunization: Secondary | ICD-10-CM

## 2012-04-21 DIAGNOSIS — G4700 Insomnia, unspecified: Secondary | ICD-10-CM

## 2012-04-21 DIAGNOSIS — F329 Major depressive disorder, single episode, unspecified: Secondary | ICD-10-CM

## 2012-04-21 DIAGNOSIS — F3289 Other specified depressive episodes: Secondary | ICD-10-CM

## 2012-04-21 DIAGNOSIS — I10 Essential (primary) hypertension: Secondary | ICD-10-CM

## 2012-04-21 MED ORDER — TRAZODONE HCL 50 MG PO TABS: 25.0000 mg | ORAL_TABLET | Freq: Every evening | ORAL | Status: DC | PRN

## 2012-04-21 NOTE — Progress Notes (Signed)
Subjective:    Patient ID: Jenny Cardenas, female    DOB: [birth year], 61 y.o.   MRN: [id number]  HPI Follow up for HTN and DM and hyperlipidemia Increased stressors and financial issues Insomnia Blood pressure stable    Review of Systems  Constitutional: Negative for activity change, appetite change and fatigue.  HENT: Negative for ear pain, congestion, neck pain, postnasal drip and sinus pressure.   Eyes: Negative for redness and visual disturbance.  Respiratory: Negative for cough, shortness of breath and wheezing.   Gastrointestinal: Negative for abdominal pain and abdominal distention.  Genitourinary: Negative for dysuria, frequency and menstrual problem.  Musculoskeletal: Negative for myalgias, joint swelling and arthralgias.  Skin: Negative for rash and wound.  Neurological: Negative for dizziness, weakness and headaches.  Hematological: Negative for adenopathy. Does not bruise/bleed easily.  Psychiatric/Behavioral: Negative for disturbed wake/sleep cycle and decreased concentration.   Past Medical History  Diagnosis Date  . Hypertension   . Hyperlipidemia   . Obesity   . Depression   . Carpal tunnel syndrome     History   Social History  . Marital Status: Widowed    Spouse Name: N/A    Number of Children: N/A  . Years of Education: N/A   Occupational History  . Not on file.   Social History Main Topics  . Smoking status: Former Games developer  . Smokeless tobacco: Former Neurosurgeon    Quit date: 08/17/2010   Comment: smokes about 4 cigs per day  . Alcohol Use: Yes     occ. glass of wine   . Drug Use: No  . Sexually Active: Not Currently   Other Topics Concern  . Not on file   Social History Narrative  . No narrative on file    Past Surgical History  Procedure Date  . Cesarean section   . Abdominal hysterectomy     Family History  Problem Relation Age of Onset  . COPD Mother   . Depression Mother   . Hypertension Mother   . Hyperlipidemia Mother   .  Alzheimer's disease Father   . Heart disease Father     No Known Allergies  Current Outpatient Prescriptions on File Prior to Visit  Medication Sig Dispense Refill  . ALPRAZolam (XANAX) 0.5 MG tablet Take 1 tablet (0.5 mg total) by mouth 3 (three) times daily as needed.  90 tablet  5  . benazepril-hydrochlorthiazide (LOTENSIN HCT) 20-12.5 MG per tablet Take 1 tablet by mouth daily.  30 tablet  11  . betamethasone dipropionate (DIPROLENE) 0.05 % cream Apply topically 2 (two) times daily. Next 50-50 with a Sarna lotion and apply twice daily  45 g  6  . desvenlafaxine (PRISTIQ) 100 MG 24 hr tablet Take 1 tablet (100 mg total) by mouth daily.  30 tablet  6  . diphenhydramine-acetaminophen (TYLENOL PM) 25-500 MG TABS Take 1 tablet by mouth at bedtime as needed.        . Multiple Vitamin (MULTIVITAMIN) tablet Take 1 tablet by mouth daily.      . Pitavastatin Calcium (LIVALO) 2 MG TABS Take 1 tablet by mouth every other day.      . valACYclovir (VALTREX) 1000 MG tablet Take 1 tablet (1,000 mg total) by mouth as needed.  30 tablet  1  . Vitamin D, Ergocalciferol, (DRISDOL) 50000 UNITS CAPS Take 1 capsule (50,000 Units total) by mouth every 7 (seven) days.  10 capsule  3  . traZODone (DESYREL) 50 MG tablet Take 0.5-1 tablets (25-50  mg total) by mouth at bedtime as needed for sleep.  30 tablet  3    BP 120/80  Pulse 72  Temp 98.2 F (36.8 C)  Resp 16  Ht 5\' 3"  (1.6 m)  Wt 206 lb (93.441 kg)  BMI 36.49 kg/m2       Objective:   Physical Exam  Nursing note and vitals reviewed. Constitutional: She is oriented to person, place, and time. She appears well-developed and well-nourished. No distress.  HENT:  Head: Normocephalic and atraumatic.  Right Ear: External ear normal.  Left Ear: External ear normal.  Nose: Nose normal.  Mouth/Throat: Oropharynx is clear and moist.  Eyes: Conjunctivae and EOM are normal. Pupils are equal, round, and reactive to light.  Neck: Normal range of motion.  Neck supple. No JVD present. No tracheal deviation present. No thyromegaly present.  Cardiovascular: Normal rate, regular rhythm, normal heart sounds and intact distal pulses.   No murmur heard. Pulmonary/Chest: Effort normal and breath sounds normal. She has no wheezes. She exhibits no tenderness.  Abdominal: Soft. Bowel sounds are normal.  Musculoskeletal: Normal range of motion. She exhibits no edema and no tenderness.  Lymphadenopathy:    She has no cervical adenopathy.  Neurological: She is alert and oriented to person, place, and time. She has normal reflexes. No cranial nerve deficit.  Skin: Skin is warm and dry. She is not diaphoretic.  Psychiatric: She has a normal mood and affect. Her behavior is normal.       Increased tearfullness          Assessment & Plan:   HTN stable Increased stress and depression with increased insomnia Trial of Desyrel  Stable lipids.

## 2012-04-21 NOTE — Patient Instructions (Signed)
The patient is instructed to continue all medications as prescribed. Schedule followup with check out clerk upon leaving the clinic  

## 2012-05-06 NOTE — Progress Notes (Signed)
Subjective:    Patient ID: Jenny Cardenas, female    DOB: [birth year], 61 y.o.   MRN: [id number]  HPI Patient is a 61 year old female with multiple medical problems including hyperlipidemia hypertension and depression.  She also has type 2 diabetes secondary to obesity.  She presents today for monitoring of her lipids hypertension diabetes and for samples of her antidepressant to help her in compliance.  She's under significant stressors caring for her elderly mother having financial pressures and job pressures   Review of Systems  Constitutional: Negative for activity change, appetite change and fatigue.  HENT: Negative for ear pain, congestion, neck pain, postnasal drip and sinus pressure.   Eyes: Negative for redness and visual disturbance.  Respiratory: Negative for cough, shortness of breath and wheezing.   Gastrointestinal: Negative for abdominal pain and abdominal distention.  Genitourinary: Negative for dysuria, frequency and menstrual problem.  Musculoskeletal: Negative for myalgias, joint swelling and arthralgias.  Skin: Negative for rash and wound.  Neurological: Negative for dizziness, weakness and headaches.  Hematological: Negative for adenopathy. Does not bruise/bleed easily.  Psychiatric/Behavioral: Negative for disturbed wake/sleep cycle and decreased concentration.   Past Medical History  Diagnosis Date  . Hypertension   . Hyperlipidemia   . Obesity   . Depression   . Carpal tunnel syndrome     History   Social History  . Marital Status: Widowed    Spouse Name: N/A    Number of Children: N/A  . Years of Education: N/A   Occupational History  . Not on file.   Social History Main Topics  . Smoking status: Former Games developer  . Smokeless tobacco: Former Neurosurgeon    Quit date: 08/17/2010   Comment: smokes about 4 cigs per day  . Alcohol Use: Yes     occ. glass of wine   . Drug Use: No  . Sexually Active: Not Currently   Other Topics Concern  . Not on file     Social History Narrative  . No narrative on file    Past Surgical History  Procedure Date  . Cesarean section   . Abdominal hysterectomy     Family History  Problem Relation Age of Onset  . COPD Mother   . Depression Mother   . Hypertension Mother   . Hyperlipidemia Mother   . Alzheimer's disease Father   . Heart disease Father     No Known Allergies  Current Outpatient Prescriptions on File Prior to Visit  Medication Sig Dispense Refill  . desvenlafaxine (PRISTIQ) 100 MG 24 hr tablet Take 1 tablet (100 mg total) by mouth daily.  30 tablet  6  . diphenhydramine-acetaminophen (TYLENOL PM) 25-500 MG TABS Take 1 tablet by mouth at bedtime as needed.        . Pitavastatin Calcium (LIVALO) 2 MG TABS Take 1 tablet by mouth every other day.      . benazepril-hydrochlorthiazide (LOTENSIN HCT) 20-12.5 MG per tablet Take 1 tablet by mouth daily.  30 tablet  11  . traZODone (DESYREL) 50 MG tablet Take 0.5-1 tablets (25-50 mg total) by mouth at bedtime as needed for sleep.  30 tablet  3    BP 120/70  Pulse 76  Temp 98.2 F (36.8 C)  Resp 16  Ht 5\' 3"  (1.6 m)  Wt 204 lb (92.534 kg)  BMI 36.14 kg/m2       Objective:   Physical Exam  Nursing note and vitals reviewed. Constitutional: She appears well-developed and well-nourished.  HENT:  Head: Normocephalic and atraumatic.  Eyes: Conjunctivae normal and EOM are normal.  Neck: Normal range of motion. Neck supple.  Cardiovascular: Normal rate and regular rhythm.   Pulmonary/Chest: Effort normal and breath sounds normal.          Assessment & Plan:  Stable blood pressure on current medications.  Discussed weight and exercise as adjuvant therapies for hyperlipidemia therapy.  Continue monitoring elevated blood sugars to rule out frank diabetes  Continued antidepressant therapy samples of pristiq given

## 2012-07-21 ENCOUNTER — Ambulatory Visit (INDEPENDENT_AMBULATORY_CARE_PROVIDER_SITE_OTHER): Payer: BC Managed Care – PPO | Admitting: Internal Medicine

## 2012-07-21 ENCOUNTER — Encounter: Payer: Self-pay | Admitting: Internal Medicine

## 2012-07-21 VITALS — BP 130/80 | HR 72 | Temp 98.2°F | Resp 16 | Ht 63.0 in | Wt 199.0 lb

## 2012-07-21 DIAGNOSIS — E785 Hyperlipidemia, unspecified: Secondary | ICD-10-CM

## 2012-07-21 DIAGNOSIS — F32A Depression, unspecified: Secondary | ICD-10-CM

## 2012-07-21 DIAGNOSIS — I10 Essential (primary) hypertension: Secondary | ICD-10-CM

## 2012-07-21 DIAGNOSIS — F3289 Other specified depressive episodes: Secondary | ICD-10-CM

## 2012-07-21 DIAGNOSIS — F329 Major depressive disorder, single episode, unspecified: Secondary | ICD-10-CM

## 2012-07-21 LAB — BASIC METABOLIC PANEL
Chloride: 99 mEq/L (ref 96–112)
Potassium: 3.9 mEq/L (ref 3.5–5.1)
Sodium: 135 mEq/L (ref 135–145)

## 2012-07-21 LAB — LIPID PANEL
Cholesterol: 220 mg/dL — ABNORMAL HIGH (ref 0–200)
Total CHOL/HDL Ratio: 8
Triglycerides: 350 mg/dL — ABNORMAL HIGH (ref 0.0–149.0)
VLDL: 70 mg/dL — ABNORMAL HIGH (ref 0.0–40.0)

## 2012-07-21 NOTE — Progress Notes (Signed)
  Subjective:    Patient ID: Jenny Cardenas, female    DOB: 04-28-1951, 61 y.o.   MRN: [id number]  HPI Weight loss and better mood with herbal supplements Blood glucoses stable no hypoglycemias Blood pressure stable monitoring blood work     Review of Systems  Constitutional: Negative for activity change, appetite change and fatigue.  HENT: Negative for ear pain, congestion, neck pain, postnasal drip and sinus pressure.   Eyes: Negative for redness and visual disturbance.  Respiratory: Negative for cough, shortness of breath and wheezing.   Gastrointestinal: Negative for abdominal pain and abdominal distention.  Genitourinary: Negative for dysuria, frequency and menstrual problem.  Musculoskeletal: Negative for myalgias, joint swelling and arthralgias.  Skin: Negative for rash and wound.  Neurological: Negative for dizziness, weakness and headaches.  Hematological: Negative for adenopathy. Does not bruise/bleed easily.  Psychiatric/Behavioral: Negative for sleep disturbance and decreased concentration.       Objective:   Physical Exam  Nursing note and vitals reviewed. Constitutional: She is oriented to person, place, and time. She appears well-developed and well-nourished. No distress.  HENT:  Head: Normocephalic and atraumatic.  Right Ear: External ear normal.  Left Ear: External ear normal.  Nose: Nose normal.  Mouth/Throat: Oropharynx is clear and moist.  Eyes: Conjunctivae normal and EOM are normal. Pupils are equal, round, and reactive to light.  Neck: Normal range of motion. Neck supple. No JVD present. No tracheal deviation present. No thyromegaly present.  Cardiovascular: Normal rate, regular rhythm, normal heart sounds and intact distal pulses.   No murmur heard. Pulmonary/Chest: Effort normal and breath sounds normal. She has no wheezes. She exhibits no tenderness.  Abdominal: Soft. Bowel sounds are normal.  Musculoskeletal: Normal range of motion. She exhibits no  edema and no tenderness.  Lymphadenopathy:    She has no cervical adenopathy.  Neurological: She is alert and oriented to person, place, and time. She has normal reflexes. No cranial nerve deficit.  Skin: Skin is warm and dry. She is not diaphoretic.  Psychiatric: She has a normal mood and affect. Her behavior is normal.          Assessment & Plan:  Mood management Stay on pristiq daily for depression Blood pressure stable Weight loss and diet discussed Migraine

## 2012-08-13 HISTORY — PX: EYE SURGERY: SHX253

## 2012-10-21 ENCOUNTER — Telehealth: Payer: Self-pay | Admitting: Internal Medicine

## 2012-10-21 NOTE — Telephone Encounter (Signed)
Already gave samples to pt

## 2012-10-21 NOTE — Telephone Encounter (Signed)
Patient calling to see if office has Pristiq samples available.  On callback, unable to reach patient at number given; no voicemail availability.  Info to office.  krs/can

## 2012-11-24 ENCOUNTER — Encounter: Payer: Self-pay | Admitting: Internal Medicine

## 2012-11-24 ENCOUNTER — Ambulatory Visit (INDEPENDENT_AMBULATORY_CARE_PROVIDER_SITE_OTHER): Payer: BC Managed Care – PPO | Admitting: Internal Medicine

## 2012-11-24 VITALS — BP 110/76 | HR 76 | Temp 98.6°F | Resp 16 | Ht 63.0 in | Wt 198.0 lb

## 2012-11-24 DIAGNOSIS — F329 Major depressive disorder, single episode, unspecified: Secondary | ICD-10-CM

## 2012-11-24 DIAGNOSIS — F32A Depression, unspecified: Secondary | ICD-10-CM

## 2012-11-24 DIAGNOSIS — E669 Obesity, unspecified: Secondary | ICD-10-CM

## 2012-11-24 DIAGNOSIS — I10 Essential (primary) hypertension: Secondary | ICD-10-CM

## 2012-11-24 DIAGNOSIS — E1169 Type 2 diabetes mellitus with other specified complication: Secondary | ICD-10-CM

## 2012-11-24 DIAGNOSIS — E1165 Type 2 diabetes mellitus with hyperglycemia: Secondary | ICD-10-CM

## 2012-11-24 LAB — BASIC METABOLIC PANEL
BUN: 8 mg/dL (ref 6–23)
CO2: 30 mEq/L (ref 19–32)
Calcium: 8.9 mg/dL (ref 8.4–10.5)
GFR: 80.54 mL/min (ref >60.00)
Glucose, Bld: 97 mg/dL (ref 70–99)

## 2012-11-24 MED ORDER — VORTIOXETINE HBR 20 MG PO TABS: 1.0000 | ORAL_TABLET | Freq: Every morning | ORAL | Status: DC

## 2012-11-24 NOTE — Progress Notes (Signed)
  Subjective:    Patient ID: Jenny Cardenas, female    DOB: 1950-12-09, 62 y.o.   MRN: [id number]  HPI  Diabetes. Has not been measuring blood glucoses but has had a stable a1c Increased stress due to winter weather and power outage Has noted anger issues and increased crying  Suicidal thoughts ( no ideation) Off the pristiq with rebound depression   Review of Systems  Constitutional: Negative for activity change, appetite change and fatigue.  HENT: Negative for ear pain, congestion, neck pain, postnasal drip and sinus pressure.   Eyes: Negative for redness and visual disturbance.  Respiratory: Negative for cough, shortness of breath and wheezing.   Gastrointestinal: Negative for abdominal pain and abdominal distention.  Genitourinary: Negative for dysuria, frequency and menstrual problem.  Musculoskeletal: Negative for myalgias, joint swelling and arthralgias.  Skin: Negative for rash and wound.  Neurological: Negative for dizziness, weakness and headaches.  Hematological: Negative for adenopathy. Does not bruise/bleed easily.  Psychiatric/Behavioral: Negative for sleep disturbance and decreased concentration.       Objective:   Physical Exam  Nursing note and vitals reviewed. Constitutional: She is oriented to person, place, and time. She appears well-developed and well-nourished. No distress.  HENT:  Head: Normocephalic and atraumatic.  Right Ear: External ear normal.  Left Ear: External ear normal.  Nose: Nose normal.  Mouth/Throat: Oropharynx is clear and moist.  Eyes: Conjunctivae and EOM are normal. Pupils are equal, round, and reactive to light.  Neck: Normal range of motion. Neck supple. No JVD present. No tracheal deviation present. No thyromegaly present.  Cardiovascular: Normal rate, regular rhythm, normal heart sounds and intact distal pulses.   No murmur heard. Pulmonary/Chest: Effort normal and breath sounds normal. She has no wheezes. She exhibits no tenderness.   Abdominal: Soft. Bowel sounds are normal.  Musculoskeletal: Normal range of motion. She exhibits no edema and no tenderness.  Lymphadenopathy:    She has no cervical adenopathy.  Neurological: She is alert and oriented to person, place, and time. She has normal reflexes. No cranial nerve deficit.  Skin: Skin is warm and dry. She is not diaphoretic.  Psychiatric: She has a normal mood and affect. Her behavior is normal.          Assessment & Plan:  Trial of brintellix 10 Samples and side effects discussed If suicidal feeling increased call or go to the ER DM stable monitor the a1c  Patient has failed use of pristiq and prior to that the Wellbutrin and prior to that failed an SSRI including Celexa

## 2012-11-24 NOTE — Patient Instructions (Signed)
The patient is instructed to continue all medications as prescribed. Schedule followup with check out clerk upon leaving the clinic  

## 2012-12-30 ENCOUNTER — Other Ambulatory Visit: Payer: Self-pay | Admitting: *Deleted

## 2012-12-30 MED ORDER — ALPRAZOLAM 0.5 MG PO TABS
0.5000 mg | ORAL_TABLET | Freq: Three times a day (TID) | ORAL | Status: DC | PRN
Start: 2012-12-30 — End: 2013-08-18

## 2013-01-31 ENCOUNTER — Ambulatory Visit (INDEPENDENT_AMBULATORY_CARE_PROVIDER_SITE_OTHER): Payer: BC Managed Care – PPO | Admitting: Family Medicine

## 2013-01-31 ENCOUNTER — Encounter: Payer: Self-pay | Admitting: Family Medicine

## 2013-01-31 VITALS — BP 118/68 | HR 103 | Temp 98.1°F | Wt 191.0 lb

## 2013-01-31 DIAGNOSIS — R21 Rash and other nonspecific skin eruption: Secondary | ICD-10-CM | POA: Insufficient documentation

## 2013-01-31 MED ORDER — VALACYCLOVIR HCL 1 G PO TABS: 1000.0000 mg | ORAL_TABLET | Freq: Three times a day (TID) | ORAL | Status: DC

## 2013-01-31 MED ORDER — CEFTRIAXONE SODIUM 1 G IJ SOLR
1.0000 g | Freq: Once | INTRAMUSCULAR | Status: AC
  Administered 2013-01-31: 1 g via INTRAMUSCULAR

## 2013-01-31 MED ORDER — OXYCODONE-ACETAMINOPHEN 5-325 MG PO TABS: 1.0000 | ORAL_TABLET | Freq: Three times a day (TID) | ORAL | Status: DC | PRN

## 2013-01-31 NOTE — Addendum Note (Signed)
Addended by: Candie Echevaria L on: 01/31/2013 12:59 PM   Modules accepted: Orders

## 2013-01-31 NOTE — Patient Instructions (Addendum)
It was nice to meet you. This could be shingles, given your history.  Please increase your Valtrex to 1 gram three times daily x 7 days. Take oxycodone as directed (as needed) for pain.  If you're not better by Monday, please call Dr. Lovell Sheehan.

## 2013-01-31 NOTE — Progress Notes (Addendum)
Subjective:    Patient ID: Jenny Cardenas, female    DOB: September 04, 1950, 62 y.o.   MRN: [id number]  HPI  Very pleasant 62 yo female here for ? Shingles.  Had shingles in same area- left hand years ago.  A few days ago, noticed a painful blister on pad of 3rd digit on right hand.  Tingling and very painful.  Now redness and pain, no rash, extending all the way up her arm to her shoulder.  Was pinned by her tractor (no injuries) a few days ago and feels it irritated all of her nerves.  Arm is very painful.  No fevers or chills.  Has rx for valtrex- has been taking 1 gram daily for past few days.  Patient Active Problem List   Diagnosis Date Noted  . Rash and nonspecific skin eruption 01/31/2013  . Rash and other nonspecific skin eruption 10/16/2010  . DIAB W/OTH MANIFESTS TYPE II/UNS TYPE UNCNTRL 04/10/2010  . OSTEOPENIA 08/27/2008  . UNS ADVRS EFF UNS RX MEDICINAL&BIOLOGICAL SBSTNC 09/18/2007  . CARPAL TUNNEL SYNDROME 03/04/2007  . ACTINIC KERATOSIS 03/04/2007  . DEPRESSION 02/13/2007  . HYPERLIPIDEMIA 12/25/2006  . OBESITY 12/25/2006  . HYPERTENSION 12/25/2006   Past Medical History  Diagnosis Date  . Hypertension   . Hyperlipidemia   . Obesity   . Depression   . Carpal tunnel syndrome    Past Surgical History  Procedure Laterality Date  . Cesarean section    . Abdominal hysterectomy     History  Substance Use Topics  . Smoking status: Former Games developer  . Smokeless tobacco: Former Neurosurgeon    Quit date: 08/17/2010     Comment: smokes about 4 cigs per day  . Alcohol Use: Yes     Comment: occ. glass of wine    Family History  Problem Relation Age of Onset  . COPD Mother   . Depression Mother   . Hypertension Mother   . Hyperlipidemia Mother   . Alzheimer's disease Father   . Heart disease Father    No Known Allergies Current Outpatient Prescriptions on File Prior to Visit  Medication Sig Dispense Refill  . ALPRAZolam (XANAX) 0.5 MG tablet Take 1 tablet (0.5 mg  total) by mouth 3 (three) times daily as needed.  90 tablet  5  . benazepril-hydrochlorthiazide (LOTENSIN HCT) 20-12.5 MG per tablet Take 1 tablet by mouth daily.  30 tablet  11  . diphenhydramine-acetaminophen (TYLENOL PM) 25-500 MG TABS Take 1 tablet by mouth at bedtime as needed.        . Multiple Vitamin (MULTIVITAMIN) tablet Take 1 tablet by mouth daily.      . traZODone (DESYREL) 50 MG tablet Take 25-50 mg by mouth at bedtime as needed.      . Vortioxetine HBr (BRINTELLIX) 20 MG TABS Take 1 tablet by mouth every morning.  30 tablet  11  . desvenlafaxine (PRISTIQ) 100 MG 24 hr tablet Take 100 mg by mouth daily.        No current facility-administered medications on file prior to visit.   The PMH, PSH, Social History, Family History, Medications, and allergies have been reviewed in [facility], and have been updated if relevant.   Review of Systems    See HPI Objective:   Physical Exam  Constitutional: She appears well-developed and well-nourished. No distress.  HENT:  Head: Normocephalic.  Eyes: Pupils are equal, round, and reactive to light.  Skin:     Psychiatric: She has a normal mood and affect.  Her speech is normal and behavior is normal. Judgment and thought content normal. Cognition and memory are normal.   Small bruise on her back, no TTP over spine.  BP 118/68  Pulse 103  Temp(Src) 98.1 F (36.7 C) (Oral)  Wt 191 lb (86.637 kg)  BMI 33.84 kg/m2  SpO2 96%     Assessment & Plan:  1. Rash and nonspecific skin eruption New- could be shingles but I cannot rule out cellulitis.  Denies puncture wound or bug bite. Will increase Valtrex to 1 gram three times daily x 7 days.  IM rocephin given as well to cover ? Cellulitis. Percocet as needed for pain. Will follow up with PCP on Monday.

## 2013-02-02 ENCOUNTER — Telehealth: Payer: Self-pay | Admitting: Internal Medicine

## 2013-02-02 NOTE — Telephone Encounter (Signed)
[facility] Triage Call Report Triage Record Num: [id number] Operator: April Finney Patient Name: Jenny Cardenas Call Date & Time: 01/31/2013 11:26:01AM Patient Phone: [phone] PCP: Darryll Capers Patient Gender: Female PCP Fax : [phone] Patient DOB: Jan 08, 1951 [facility] Name: [facility] Reason for Call: Caller: [name]/Patient; PCP: Darryll Capers (Adults only); CB#: [phone]; Call regarding Shingles in hand and taking 1 Valtrex a day x 1 week. This AM the nerve pain radiating to arm and shoulder. Valtrex is making her nauseated; Severe pain radiate up into her shoulder and appears red and irritated. Afebrile. No emergent symptoms. See in 24 hrs care advice given per skin lesions. She would like appt for today. Scheduled appt at [facility] office for today at 12:30pm. Protocol(s) Used: Skin Lesions Recommended Outcome per Protocol: See Provider within 24 hours Reason for Outcome: Painful lesion(s) unrelieved with home care measures Care Advice: ~ SYMPTOM / CONDITION MANAGEMENT Analgesic/Antipyretic Advice - Acetaminophen: Consider acetaminophen as directed on label or by pharmacist/provider for pain or fever PRECAUTIONS: - Use if there is no history of liver disease, alcoholism, or intake of three or more alcohol drinks per day - Only if approved by provider during pregnancy or when breastfeeding - During pregnancy, acetaminophen should not be taken more than 3 consecutive days without telling provider - Do not exceed recommended dose or frequency ~ Analgesic/Antipyretic Advice - NSAIDs: Consider aspirin, ibuprofen, naproxen or ketoprofen for pain or fever as directed on label or by pharmacist/provider. PRECAUTIONS: - If over 41 years of age, should not take longer than 1 week without consulting provider. EXCEPTIONS: - Should not be used if taking blood thinners or have bleeding problems. - Do not use if have history of sensitivity/allergy to any of these medications; or  history of cardiovascular, ulcer, kidney, liver disease or diabetes unless approved by provider. - Do not exceed recommended dose or frequency. ~ 01/31/2013 11:40:30AM Page 1 of 1 [id number]

## 2013-03-16 ENCOUNTER — Other Ambulatory Visit: Payer: Self-pay | Admitting: *Deleted

## 2013-03-16 MED ORDER — TRAZODONE HCL 50 MG PO TABS: 25.0000 mg | ORAL_TABLET | Freq: Every evening | ORAL | Status: DC | PRN

## 2013-04-13 ENCOUNTER — Other Ambulatory Visit: Payer: Self-pay | Admitting: Internal Medicine

## 2013-04-20 ENCOUNTER — Encounter: Payer: Self-pay | Admitting: Internal Medicine

## 2013-04-20 ENCOUNTER — Ambulatory Visit (INDEPENDENT_AMBULATORY_CARE_PROVIDER_SITE_OTHER): Payer: BC Managed Care – PPO | Admitting: Internal Medicine

## 2013-04-20 VITALS — BP 124/76 | HR 80 | Temp 98.6°F | Resp 16 | Wt 184.0 lb

## 2013-04-20 DIAGNOSIS — Z23 Encounter for immunization: Secondary | ICD-10-CM

## 2013-04-20 DIAGNOSIS — F32A Depression, unspecified: Secondary | ICD-10-CM

## 2013-04-20 DIAGNOSIS — F329 Major depressive disorder, single episode, unspecified: Secondary | ICD-10-CM

## 2013-04-20 DIAGNOSIS — B009 Herpesviral infection, unspecified: Secondary | ICD-10-CM

## 2013-04-20 MED ORDER — VALACYCLOVIR HCL 500 MG PO TABS: 500.0000 mg | ORAL_TABLET | Freq: Every day | ORAL | Status: DC

## 2013-04-20 NOTE — Progress Notes (Signed)
  Subjective:    Patient ID: Jenny Cardenas, female    DOB: [birth year], 62 y.o.   MRN: [id number]  HPI Patient has a recurrent herpetic whit lo on her left middle  finger   Review of Systems  Constitutional: Negative for activity change, appetite change and fatigue.  HENT: Negative for ear pain, congestion, neck pain, postnasal drip and sinus pressure.   Eyes: Negative for redness and visual disturbance.  Respiratory: Negative for cough, shortness of breath and wheezing.   Gastrointestinal: Negative for abdominal pain and abdominal distention.  Genitourinary: Negative for dysuria, frequency and menstrual problem.  Musculoskeletal: Negative for myalgias, joint swelling and arthralgias.  Skin: Negative for rash and wound.  Neurological: Negative for dizziness, weakness and headaches.  Hematological: Negative for adenopathy. Does not bruise/bleed easily.  Psychiatric/Behavioral: Negative for sleep disturbance and decreased concentration.       Objective:   Physical Exam  Nursing note and vitals reviewed. Constitutional: She is oriented to person, place, and time. She appears well-developed and well-nourished. No distress.  HENT:  Head: Normocephalic and atraumatic.  Right Ear: External ear normal.  Left Ear: External ear normal.  Nose: Nose normal.  Mouth/Throat: Oropharynx is clear and moist.  Eyes: Conjunctivae and EOM are normal. Pupils are equal, round, and reactive to light.  Neck: Normal range of motion. Neck supple. No JVD present. No tracheal deviation present. No thyromegaly present.  Cardiovascular: Normal rate, regular rhythm, normal heart sounds and intact distal pulses.   No murmur heard. Pulmonary/Chest: Effort normal and breath sounds normal. She has no wheezes. She exhibits no tenderness.  Abdominal: Soft. Bowel sounds are normal.  Musculoskeletal: Normal range of motion. She exhibits no edema and no tenderness.  Lymphadenopathy:    She has no cervical adenopathy.   Neurological: She is alert and oriented to person, place, and time. She has normal reflexes. No cranial nerve deficit.  Skin: Skin is warm and dry. She is not diaphoretic.  Herpetic lesion on the left index finger  Psychiatric: She has a normal mood and affect. Her behavior is normal.          Assessment & Plan:  Suppressive therapy recommend 500 mg of vatrx daily Lesion on leg and left index finger recurrent  Depression stable

## 2013-07-06 ENCOUNTER — Other Ambulatory Visit: Payer: Self-pay

## 2013-07-06 DIAGNOSIS — Z803 Family history of malignant neoplasm of breast: Secondary | ICD-10-CM

## 2013-07-06 DIAGNOSIS — Z1231 Encounter for screening mammogram for malignant neoplasm of breast: Secondary | ICD-10-CM

## 2013-07-07 ENCOUNTER — Ambulatory Visit
Admission: RE | Admit: 2013-07-07 | Discharge: 2013-07-07 | Disposition: A | Discharge status: Home / Self Care | Payer: BC Managed Care – PPO | Source: Ambulatory Visit

## 2013-07-07 DIAGNOSIS — Z1231 Encounter for screening mammogram for malignant neoplasm of breast: Secondary | ICD-10-CM

## 2013-07-07 DIAGNOSIS — Z803 Family history of malignant neoplasm of breast: Secondary | ICD-10-CM

## 2013-07-10 ENCOUNTER — Other Ambulatory Visit: Payer: Self-pay | Admitting: Internal Medicine

## 2013-07-10 DIAGNOSIS — R928 Other abnormal and inconclusive findings on diagnostic imaging of breast: Secondary | ICD-10-CM

## 2013-07-13 ENCOUNTER — Encounter: Payer: Self-pay | Admitting: Internal Medicine

## 2013-07-15 ENCOUNTER — Ambulatory Visit
Admission: RE | Admit: 2013-07-15 | Discharge: 2013-07-15 | Disposition: A | Discharge status: Home / Self Care | Payer: BC Managed Care – PPO | Source: Ambulatory Visit | Attending: Internal Medicine | Admitting: Internal Medicine

## 2013-07-15 DIAGNOSIS — R928 Other abnormal and inconclusive findings on diagnostic imaging of breast: Secondary | ICD-10-CM

## 2013-07-27 ENCOUNTER — Other Ambulatory Visit: Payer: BC Managed Care – PPO

## 2013-08-18 ENCOUNTER — Other Ambulatory Visit: Payer: Self-pay | Admitting: Internal Medicine

## 2013-09-04 ENCOUNTER — Ambulatory Visit (AMBULATORY_SURGERY_CENTER): Payer: Self-pay | Admitting: *Deleted

## 2013-09-04 VITALS — Ht 63.0 in | Wt 180.6 lb

## 2013-09-04 DIAGNOSIS — Z1211 Encounter for screening for malignant neoplasm of colon: Secondary | ICD-10-CM

## 2013-09-04 MED ORDER — MOVIPREP 100 G PO SOLR: ORAL | Status: DC

## 2013-09-04 NOTE — Progress Notes (Signed)
No allergies to eggs or soy. No problems with anesthesia.  

## 2013-09-08 ENCOUNTER — Encounter: Payer: Self-pay | Admitting: Internal Medicine

## 2013-09-09 ENCOUNTER — Ambulatory Visit: Payer: BC Managed Care – PPO | Admitting: Internal Medicine

## 2013-09-11 ENCOUNTER — Encounter: Payer: Self-pay | Admitting: *Deleted

## 2013-09-14 ENCOUNTER — Encounter: Payer: Self-pay | Admitting: Internal Medicine

## 2013-09-14 ENCOUNTER — Ambulatory Visit (INDEPENDENT_AMBULATORY_CARE_PROVIDER_SITE_OTHER): Payer: BC Managed Care – PPO | Admitting: Internal Medicine

## 2013-09-14 VITALS — BP 116/70 | HR 88 | Temp 98.2°F | Resp 16 | Ht 63.0 in | Wt 180.0 lb

## 2013-09-14 DIAGNOSIS — M899 Disorder of bone, unspecified: Secondary | ICD-10-CM

## 2013-09-14 DIAGNOSIS — E049 Nontoxic goiter, unspecified: Secondary | ICD-10-CM

## 2013-09-14 DIAGNOSIS — E1169 Type 2 diabetes mellitus with other specified complication: Secondary | ICD-10-CM

## 2013-09-14 DIAGNOSIS — E669 Obesity, unspecified: Secondary | ICD-10-CM

## 2013-09-14 DIAGNOSIS — M949 Disorder of cartilage, unspecified: Secondary | ICD-10-CM

## 2013-09-14 DIAGNOSIS — R635 Abnormal weight gain: Secondary | ICD-10-CM

## 2013-09-14 DIAGNOSIS — R5381 Other malaise: Secondary | ICD-10-CM

## 2013-09-14 DIAGNOSIS — F3289 Other specified depressive episodes: Secondary | ICD-10-CM

## 2013-09-14 DIAGNOSIS — IMO0002 Reserved for concepts with insufficient information to code with codable children: Secondary | ICD-10-CM

## 2013-09-14 DIAGNOSIS — E785 Hyperlipidemia, unspecified: Secondary | ICD-10-CM

## 2013-09-14 DIAGNOSIS — I10 Essential (primary) hypertension: Secondary | ICD-10-CM

## 2013-09-14 DIAGNOSIS — R638 Other symptoms and signs concerning food and fluid intake: Secondary | ICD-10-CM

## 2013-09-14 DIAGNOSIS — M898X9 Other specified disorders of bone, unspecified site: Secondary | ICD-10-CM

## 2013-09-14 DIAGNOSIS — R5383 Other fatigue: Secondary | ICD-10-CM

## 2013-09-14 DIAGNOSIS — F329 Major depressive disorder, single episode, unspecified: Secondary | ICD-10-CM

## 2013-09-14 DIAGNOSIS — T50905A Adverse effect of unspecified drugs, medicaments and biological substances, initial encounter: Secondary | ICD-10-CM

## 2013-09-14 DIAGNOSIS — E1165 Type 2 diabetes mellitus with hyperglycemia: Secondary | ICD-10-CM

## 2013-09-14 LAB — TSH: TSH: 0.27 u[IU]/mL — ABNORMAL LOW (ref 0.35–5.50)

## 2013-09-14 LAB — T3, FREE: T3 FREE: 3.9 pg/mL (ref 2.3–4.2)

## 2013-09-14 LAB — T4, FREE: Free T4: 0.93 ng/dL (ref 0.60–1.60)

## 2013-09-14 LAB — HEMOGLOBIN A1C: HEMOGLOBIN A1C: 6.5 % (ref 4.6–6.5)

## 2013-09-14 NOTE — Addendum Note (Signed)
Addended by: Ricard Dillon on: 09/14/2013 03:33 PM   Modules accepted: Orders

## 2013-09-14 NOTE — Patient Instructions (Signed)
The patient is instructed to continue all medications as prescribed. Schedule followup with check out clerk upon leaving the clinic  

## 2013-09-14 NOTE — Progress Notes (Signed)
Pre visit review using our clinic review tool, if applicable. No additional management support is needed unless otherwise documented below in the visit note. 

## 2013-09-14 NOTE — Progress Notes (Signed)
Subjective:    Patient ID: Jenny Cardenas, female    DOB: 05-14-1951, 63 y.o.   MRN: [id number]  HPI  maintained loss of weight Noted knot in neck.... Thyroid US Mild "fog" in thinking Fatigue Plateau in weight loss Cramping  In joints Last a1c     Review of Systems  Constitutional: Positive for appetite change and unexpected weight change. Negative for activity change and fatigue.  HENT: Positive for trouble swallowing. Negative for congestion, ear pain, postnasal drip and sinus pressure.   Eyes: Positive for pain. Negative for redness and visual disturbance.  Respiratory: Negative for cough, shortness of breath and wheezing.   Cardiovascular: Positive for leg swelling.  Gastrointestinal: Negative for abdominal pain and abdominal distention.  Genitourinary: Negative for dysuria, frequency and menstrual problem.  Musculoskeletal: Negative for arthralgias, joint swelling and neck pain.  Skin: Negative for rash and wound.  Neurological: Negative for dizziness, weakness and headaches.  Hematological: Negative for adenopathy. Does not bruise/bleed easily.  Psychiatric/Behavioral: Negative for sleep disturbance and decreased concentration.   Past Medical History  Diagnosis Date  . Hypertension   . Hyperlipidemia   . Obesity   . Depression   . Carpal tunnel syndrome   . Anxiety   . Cataract   . Ulcer     History   Social History  . Marital Status: Widowed    Spouse Name: N/A    Number of Children: N/A  . Years of Education: N/A   Occupational History  . Not on file.   Social History Main Topics  . Smoking status: Former Research scientist (life sciences)  . Smokeless tobacco: Former Systems developer    Quit date: 08/17/2010     Comment: smokes about 4 cigs per day  . Alcohol Use: Yes     Comment: rare glass of wine   . Drug Use: No  . Sexual Activity: Not Currently   Other Topics Concern  . Not on file   Social History Narrative  . No narrative on file    Past Surgical History  Procedure  Laterality Date  . Cesarean section  1976, 1983, 1984  . Abdominal hysterectomy  1996    Family History  Problem Relation Age of Onset  . COPD Mother   . Depression Mother   . Hypertension Mother   . Hyperlipidemia Mother   . Alzheimer's disease Father   . Heart disease Father   . Colon cancer Neg Hx     Allergies  Allergen Reactions  . Other     Etholine Oxide: causes itching and hives    Current Outpatient Prescriptions on File Prior to Visit  Medication Sig Dispense Refill  . ALPRAZolam (XANAX) 0.5 MG tablet TAKE 1 TABLET THREE TIMES A DAY AS NEEDED FOR ANXIETY  90 tablet  5  . Aspirin-Acetaminophen-Caffeine (MIGRAINE RELIEF PO) Take by mouth as needed.      . benazepril-hydrochlorthiazide (LOTENSIN HCT) 20-12.5 MG per tablet TAKE 1 TABLET BY MOUTH DAILY.  30 tablet  11  . diphenhydramine-acetaminophen (TYLENOL PM) 25-500 MG TABS Take 1 tablet by mouth at bedtime as needed.        Marland Kitchen GARCINIA CAMBOGIA-CHROMIUM PO Take by mouth daily.      . Multiple Vitamin (MULTIVITAMIN) tablet Take 1 tablet by mouth daily.      Marland Kitchen oxyCODONE-acetaminophen (ROXICET) 5-325 MG per tablet Take 1 tablet by mouth every 8 (eight) hours as needed for pain.  30 tablet  0  . valACYclovir (VALTREX) 500 MG tablet Take 1  tablet (500 mg total) by mouth daily.  30 tablet  11  . Vortioxetine HBr (BRINTELLIX) 20 MG TABS Take 1 tablet by mouth every morning.  30 tablet  11   No current facility-administered medications on file prior to visit.    BP 116/70  Pulse 88  Temp(Src) 98.2 F (36.8 C)  Resp 16  Ht 5\' 3"  (1.6 m)  Wt 180 lb (81.647 kg)  BMI 31.89 kg/m2        Objective:   Physical Exam  Constitutional: She is oriented to person, place, and time. She appears well-developed and well-nourished. No distress.  HENT:  Head: Normocephalic and atraumatic.  Eyes: Conjunctivae and EOM are normal. Pupils are equal, round, and reactive to light.  Neck: Normal range of motion. Neck supple. No JVD  present. No tracheal deviation present. Thyromegaly present.  Cardiovascular: Normal rate, regular rhythm and intact distal pulses.   Murmur heard. Pulmonary/Chest: Effort normal and breath sounds normal. She has no wheezes. She exhibits no tenderness.  Abdominal: Soft. Bowel sounds are normal.  Musculoskeletal: Normal range of motion. She exhibits no edema and no tenderness.  Lymphadenopathy:    She has no cervical adenopathy.  Neurological: She is alert and oriented to person, place, and time. She has normal reflexes. No cranial nerve deficit.  Skin: Skin is warm and dry. She is not diaphoretic.  Psychiatric: She has a normal mood and affect. Her behavior is normal.          Assessment & Plan:  possible thyroid cysts and hypothyroid   TSH and T3 and T4 And thyroid US for neck mass Calcium and PTH for bone pain A1C Weight loss and DM add

## 2013-09-15 ENCOUNTER — Telehealth: Payer: Self-pay | Admitting: Internal Medicine

## 2013-09-15 NOTE — Telephone Encounter (Signed)
Relevant patient education assigned to patient using Emmi. ° °

## 2013-09-16 LAB — PTH, INTACT AND CALCIUM
Calcium: 9.4 mg/dL (ref 8.4–10.5)
PTH: 30.5 pg/mL (ref 14.0–72.0)

## 2013-09-17 ENCOUNTER — Telehealth: Payer: Self-pay

## 2013-09-17 ENCOUNTER — Ambulatory Visit
Admission: RE | Admit: 2013-09-17 | Discharge: 2013-09-17 | Disposition: A | Discharge status: Home / Self Care | Payer: BC Managed Care – PPO | Source: Ambulatory Visit | Attending: Internal Medicine | Admitting: Internal Medicine

## 2013-09-17 DIAGNOSIS — E049 Nontoxic goiter, unspecified: Secondary | ICD-10-CM

## 2013-09-17 NOTE — Telephone Encounter (Signed)
Relevant patient education assigned to patient using Emmi. ° °

## 2013-09-18 ENCOUNTER — Other Ambulatory Visit: Payer: Self-pay | Admitting: *Deleted

## 2013-09-18 ENCOUNTER — Ambulatory Visit (AMBULATORY_SURGERY_CENTER): Payer: BC Managed Care – PPO | Admitting: Internal Medicine

## 2013-09-18 ENCOUNTER — Encounter: Payer: Self-pay | Admitting: Internal Medicine

## 2013-09-18 VITALS — BP 117/64 | HR 85 | Temp 96.9°F | Resp 30 | Ht 63.0 in | Wt 180.0 lb

## 2013-09-18 DIAGNOSIS — D126 Benign neoplasm of colon, unspecified: Secondary | ICD-10-CM

## 2013-09-18 DIAGNOSIS — Z1211 Encounter for screening for malignant neoplasm of colon: Secondary | ICD-10-CM

## 2013-09-18 DIAGNOSIS — E041 Nontoxic single thyroid nodule: Secondary | ICD-10-CM

## 2013-09-18 MED ORDER — SODIUM CHLORIDE 0.9 % IV SOLN: 500.0000 mL | INTRAVENOUS | Status: DC

## 2013-09-18 NOTE — Progress Notes (Signed)
Report to pacu rn, vss, bbs=clear 

## 2013-09-18 NOTE — Op Note (Signed)
[facility]  [address]. [place], [id number]   COLONOSCOPY PROCEDURE REPORT  PATIENT: Jenny, Cardenas  MR#: [id number] BIRTHDATE: [birth year] , 28  yrs. old GENDER: Female ENDOSCOPIST: Eustace Quail, MD REFERRED MB:TDHR Vear Clock, M.D. PROCEDURE DATE:  09/18/2013 PROCEDURE:   Colonoscopy with snare polypectomy x 2 First Screening Colonoscopy - Avg.  risk and is 50 yrs.  old or older Yes.  Prior Negative Screening - Now for repeat screening. N/A  History of Adenoma - Now for follow-up colonoscopy & has been > or = to 3 yrs.  N/A  Polyps Removed Today? Yes. ASA CLASS:   Class II INDICATIONS:average risk screening. MEDICATIONS: MAC sedation, administered by CRNA and propofol (Diprivan) 220mg  IV  DESCRIPTION OF PROCEDURE:   After the risks benefits and alternatives of the procedure were thoroughly explained, informed consent was obtained.  A digital rectal exam revealed no abnormalities of the rectum.   The [id number]  endoscope was introduced through the anus and advanced to the cecum, which was identified by both the appendix and ileocecal valve. No adverse events experienced.   The quality of the prep was good, using MoviPrep  The instrument was then slowly withdrawn as the colon was fully examined.  COLON FINDINGS: Two diminutive polyps were found at the cecum and in the ascending colon.  A polypectomy was performed with a cold snare.  The resection was complete and the polyp tissue was completely retrieved.   Severe diverticulosis was noted throughout the entire examined colon.   The colon mucosa was otherwise normal. Retroflexed views revealed internal hemorrhoids. The time to cecum=2 minutes 43 seconds.  Withdrawal time=9 minutes 45 seconds. The scope was withdrawn and the procedure completed. COMPLICATIONS: There were no complications.  ENDOSCOPIC IMPRESSION: 1.   Two diminutive polyps were found at the cecum and ascending colon; polypectomy  was performed with a cold snare 2.   Severe diverticulosis was noted throughout the entire examined colon 3.   The colon mucosa was otherwise normal  RECOMMENDATIONS: 1. Repeat colonoscopy in 5 years if polyp adenomatous; otherwise 10 years   eSigned:  Eustace Quail, MD 09/18/2013 2:13 PM   cc: Ricard Dillon, MD and The Patient

## 2013-09-18 NOTE — Patient Instructions (Signed)
YOU HAD AN ENDOSCOPIC PROCEDURE TODAY AT THE [facility]: Refer to the procedure report that was given to you for any specific questions about what was found during the examination.  If the procedure report does not answer your questions, please call your gastroenterologist to clarify.  If you requested that your care partner not be given the details of your procedure findings, then the procedure report has been included in a sealed envelope for you to review at your convenience later.  YOU SHOULD EXPECT: Some feelings of bloating in the abdomen. Passage of more gas than usual.  Walking can help get rid of the air that was put into your GI tract during the procedure and reduce the bloating. If you had a lower endoscopy (such as a colonoscopy or flexible sigmoidoscopy) you may notice spotting of blood in your stool or on the toilet paper. If you underwent a bowel prep for your procedure, then you may not have a normal bowel movement for a few days.  DIET: Your first meal following the procedure should be a light meal and then it is ok to progress to your normal diet.  A half-sandwich or bowl of soup is an example of a good first meal.  Heavy or fried foods are harder to digest and may make you feel nauseous or bloated.  Likewise meals heavy in dairy and vegetables can cause extra gas to form and this can also increase the bloating.  Drink plenty of fluids but you should avoid alcoholic beverages for 24 hours.  ACTIVITY: Your care partner should take you home directly after the procedure.  You should plan to take it easy, moving slowly for the rest of the day.  You can resume normal activity the day after the procedure however you should NOT DRIVE or use heavy machinery for 24 hours (because of the sedation medicines used during the test).    SYMPTOMS TO REPORT IMMEDIATELY: A gastroenterologist can be reached at any hour.  During normal business hours, 8:30 AM to 5:00 PM Monday through Friday,  call [phone].  After hours and on weekends, please call the GI answering service at [phone] who will take a message and have the physician on call contact you.   Following lower endoscopy (colonoscopy or flexible sigmoidoscopy):  Excessive amounts of blood in the stool  Significant tenderness or worsening of abdominal pains  Swelling of the abdomen that is new, acute  Fever of 100F or higher    FOLLOW UP: If any biopsies were taken you will be contacted by phone or by letter within the next 1-3 weeks.  Call your gastroenterologist if you have not heard about the biopsies in 3 weeks.  Our staff will call the home number listed on your records the next business day following your procedure to check on you and address any questions or concerns that you may have at that time regarding the information given to you following your procedure. This is a courtesy call and so if there is no answer at the home number and we have not heard from you through the emergency physician on call, we will assume that you have returned to your regular daily activities without incident.  SIGNATURES/CONFIDENTIALITY: You and/or your care partner have signed paperwork which will be entered into your electronic medical record.  These signatures attest to the fact that that the information above on your After Visit Summary has been reviewed and is understood.  Full responsibility of the confidentiality   of this discharge information lies with you and/or your care-partner.  Polyp, diverticulosis and high fiber diet information given.  Dr. Henrene Pastor will advise you about next colonoscopy after pathology results are reviewed.

## 2013-09-18 NOTE — Progress Notes (Signed)
Called to room to assist during endoscopic procedure.  Patient ID and intended procedure confirmed with present staff. Received instructions for my participation in the procedure from the performing physician.  

## 2013-09-21 ENCOUNTER — Telehealth: Payer: Self-pay | Admitting: Internal Medicine

## 2013-09-21 ENCOUNTER — Other Ambulatory Visit: Payer: Self-pay | Admitting: *Deleted

## 2013-09-21 ENCOUNTER — Telehealth: Payer: Self-pay | Admitting: *Deleted

## 2013-09-21 NOTE — Telephone Encounter (Signed)
Talked with pt

## 2013-09-21 NOTE — Telephone Encounter (Signed)
Pt states she received a call from [facility] while she was in recovery after having a colonoscopy last week.  However, the only thing she remembers is something about scheduling a biopsy.  Pt requesting return call from [place] with details.

## 2013-09-21 NOTE — Telephone Encounter (Signed)
  Follow up Call-  Spoke with pt.s mother she states pt. Is doing great-no problems

## 2013-09-24 ENCOUNTER — Encounter: Payer: Self-pay | Admitting: Internal Medicine

## 2013-09-25 ENCOUNTER — Telehealth: Payer: Self-pay | Admitting: Internal Medicine

## 2013-09-25 ENCOUNTER — Encounter: Payer: Self-pay | Admitting: Endocrinology

## 2013-09-25 ENCOUNTER — Ambulatory Visit (INDEPENDENT_AMBULATORY_CARE_PROVIDER_SITE_OTHER): Payer: Self-pay | Admitting: Endocrinology

## 2013-09-25 VITALS — BP 118/60 | HR 90 | Temp 98.3°F | Ht 63.0 in | Wt 183.0 lb

## 2013-09-25 DIAGNOSIS — E1165 Type 2 diabetes mellitus with hyperglycemia: Secondary | ICD-10-CM

## 2013-09-25 DIAGNOSIS — IMO0002 Reserved for concepts with insufficient information to code with codable children: Secondary | ICD-10-CM

## 2013-09-25 DIAGNOSIS — E1169 Type 2 diabetes mellitus with other specified complication: Principal | ICD-10-CM

## 2013-09-25 NOTE — Telephone Encounter (Signed)
Pt states Dr Loanne Drilling is not coming in to work today and they asked to resc pt's appt. Pt states you have another endo md that she could see soon. Pt would like a call back.  Pt doesn't want to go back to [place] now.

## 2013-09-25 NOTE — Telephone Encounter (Signed)
Talked with pt and gave her number of endo and she will call and reschedule appointment

## 2013-09-27 NOTE — Progress Notes (Signed)
   Subjective:    Patient ID: Jenny Cardenas, female    DOB: 15-Feb-1951, 63 y.o.   MRN: [id number]  HPI  -  Review of Systems     Objective:   Physical Exam        Assessment & Plan:

## 2013-09-28 ENCOUNTER — Encounter: Payer: Self-pay | Admitting: Endocrinology

## 2013-09-28 ENCOUNTER — Ambulatory Visit (INDEPENDENT_AMBULATORY_CARE_PROVIDER_SITE_OTHER): Payer: BC Managed Care – PPO | Admitting: Endocrinology

## 2013-09-28 ENCOUNTER — Ambulatory Visit: Payer: BC Managed Care – PPO | Admitting: Internal Medicine

## 2013-09-28 VITALS — BP 116/62 | HR 103 | Temp 98.8°F | Ht 63.0 in | Wt 181.0 lb

## 2013-09-28 DIAGNOSIS — E0590 Thyrotoxicosis, unspecified without thyrotoxic crisis or storm: Secondary | ICD-10-CM

## 2013-09-28 DIAGNOSIS — E042 Nontoxic multinodular goiter: Secondary | ICD-10-CM

## 2013-09-28 NOTE — Patient Instructions (Signed)

## 2013-09-28 NOTE — Progress Notes (Signed)
Subjective:    Patient ID: Jenny Cardenas, female    DOB: 1951-07-02, 63 y.o.   MRN: [id number]  HPI Pt states few mos of moderate swelling at the anterior neck, and slight assoc pain.   Past Medical History  Diagnosis Date  . Hypertension   . Hyperlipidemia   . Obesity   . Depression   . Carpal tunnel syndrome   . Anxiety   . Cataract   . Ulcer     Past Surgical History  Procedure Laterality Date  . Cesarean section  1976, 1983, 1984  . Abdominal hysterectomy  1996    History   Social History  . Marital Status: Widowed    Spouse Name: N/A    Number of Children: N/A  . Years of Education: N/A   Occupational History  . Not on file.   Social History Main Topics  . Smoking status: Former Research scientist (life sciences)  . Smokeless tobacco: Former Systems developer    Quit date: 08/17/2010     Comment: smokes about 4 cigs per day  . Alcohol Use: Yes     Comment: rare glass of wine   . Drug Use: No  . Sexual Activity: Not Currently   Other Topics Concern  . Not on file   Social History Narrative  . No narrative on file    Current Outpatient Prescriptions on File Prior to Visit  Medication Sig Dispense Refill  . ALPRAZolam (XANAX) 0.5 MG tablet TAKE 1 TABLET THREE TIMES A DAY AS NEEDED FOR ANXIETY  90 tablet  5  . Aspirin-Acetaminophen-Caffeine (MIGRAINE RELIEF PO) Take by mouth as needed.      . benazepril-hydrochlorthiazide (LOTENSIN HCT) 20-12.5 MG per tablet TAKE 1 TABLET BY MOUTH DAILY.  30 tablet  11  . diphenhydramine-acetaminophen (TYLENOL PM) 25-500 MG TABS Take 1 tablet by mouth at bedtime as needed.        Marland Kitchen GARCINIA CAMBOGIA-CHROMIUM PO Take by mouth daily.      . Multiple Vitamin (MULTIVITAMIN) tablet Take 1 tablet by mouth daily.      Marland Kitchen oxyCODONE-acetaminophen (ROXICET) 5-325 MG per tablet Take 1 tablet by mouth every 8 (eight) hours as needed for pain.  30 tablet  0  . traZODone (DESYREL) 50 MG tablet Take 50 mg by mouth at bedtime as needed for sleep.      . valACYclovir (VALTREX)  500 MG tablet Take 1 tablet (500 mg total) by mouth daily.  30 tablet  11  . Vortioxetine HBr (BRINTELLIX) 20 MG TABS Take 1 tablet by mouth every morning.  30 tablet  11   No current facility-administered medications on file prior to visit.    Allergies  Allergen Reactions  . Other     Etholine Oxide: causes itching and hives    Family History  Problem Relation Age of Onset  . COPD Mother   . Depression Mother   . Hypertension Mother   . Hyperlipidemia Mother   . Alzheimer's disease Father   . Heart disease Father   . Colon cancer Neg Hx   sister has hypothyroidism  BP 116/62  Pulse 103  Temp(Src) 98.8 F (37.1 C) (Oral)  Ht 5\' 3"  (1.6 m)  Wt 181 lb (82.101 kg)  BMI 32.07 kg/m2  SpO2 96%  Review of Systems denies hoarseness, palpitations, sob, diarrhea, and easy bruising.  She has difficulty with concentration, headache, edema, urinary frequency, acral numbness, dry skin, heat intolerance, fatigue, arthralgias, loss of sense of smell, hair loss, insomnia, and  slight blurry vision.  She has lost weight, due to her efforts.  She would like to lose more, but finds this difficult.      Objective:   Physical Exam VS: see vs page GEN: no distress.   HEAD: head: no deformity.   eyes: no periorbital swelling, no proptosis.   external nose and ears are normal.   mouth: no lesion seen.  NECK: thyroid is 10 times normal size (R>L), with multinodular surface.   CHEST WALL: no deformity.   LUNGS:  Clear to auscultation. CV: reg rate and rhythm, no murmur. ABD: abdomen is soft, nontender.  no hepatosplenomegaly.  not distended.  no hernia.   MUSCULOSKELETAL: muscle bulk and strength are grossly normal.  no obvious joint swelling.  gait is normal and steady.   EXTEMITIES: no deformity. Trace bilat leg edema.   PULSES: dorsalis pedis intact bilat.  no carotid bruit.   NEURO:  cn 2-12 grossly intact.   readily moves all 4's.  sensation is intact to touch on all 4's.  Slight  postural tremor.   SKIN:  Normal texture and temperature.  No rash or suspicious lesion is visible.   NODES:  None palpable at the neck.   PSYCH: alert, well-oriented.  Does not appear anxious nor depressed.  Lab Results  Component Value Date   TSH 0.27* 09/14/2013   (i also reviewed [place] result)    Assessment & Plan:  Large multinodular goiter, which is usually hereditary. Hyperthyroidism, mild, due to the goiter.  we discussed the causes, risks, and treatment options.  She chooses I-131 rx. Fatigue and other thyroid sxs, probably not thyroid-related.

## 2013-10-19 ENCOUNTER — Encounter (HOSPITAL_COMMUNITY)
Admission: RE | Admit: 2013-10-19 | Discharge: 2013-10-19 | Disposition: A | Discharge status: Home / Self Care | Payer: BC Managed Care – PPO | Source: Ambulatory Visit | Attending: Endocrinology | Admitting: Endocrinology

## 2013-10-19 DIAGNOSIS — E0520 Thyrotoxicosis with toxic multinodular goiter without thyrotoxic crisis or storm: Secondary | ICD-10-CM | POA: Insufficient documentation

## 2013-10-19 DIAGNOSIS — E0590 Thyrotoxicosis, unspecified without thyrotoxic crisis or storm: Secondary | ICD-10-CM

## 2013-10-20 ENCOUNTER — Encounter (HOSPITAL_COMMUNITY)
Admission: RE | Admit: 2013-10-20 | Discharge: 2013-10-20 | Disposition: A | Discharge status: Home / Self Care | Payer: BC Managed Care – PPO | Source: Ambulatory Visit | Attending: Endocrinology | Admitting: Endocrinology

## 2013-10-20 ENCOUNTER — Encounter (HOSPITAL_COMMUNITY): Payer: Self-pay

## 2013-10-20 MED ORDER — SODIUM IODIDE I 131 CAPSULE
12.0000 | Freq: Once | INTRAVENOUS | Status: AC | PRN
  Administered 2013-10-19: 12 via ORAL

## 2013-10-20 MED ORDER — SODIUM PERTECHNETATE TC 99M INJECTION
10.1000 | Freq: Once | INTRAVENOUS | Status: AC | PRN
Start: 2013-10-20 — End: 2013-10-20
  Administered 2013-10-20: 10.1 via INTRAVENOUS

## 2013-10-22 ENCOUNTER — Telehealth: Payer: Self-pay | Admitting: Endocrinology

## 2013-10-22 ENCOUNTER — Other Ambulatory Visit: Payer: Self-pay | Admitting: Endocrinology

## 2013-10-22 DIAGNOSIS — E042 Nontoxic multinodular goiter: Secondary | ICD-10-CM

## 2013-10-22 NOTE — Telephone Encounter (Signed)
Pt states she has had thyroid radiation March 9th and 10th [facility] called pt 10/22/13 stating that she needs a thyroid biopsy set for March 17th  Pt would like to know if this is necessary, as it has not been in the past   Please advise pt  Call back:[phone]  Thank You :)

## 2013-10-23 NOTE — Telephone Encounter (Signed)
Yes, i discussed with the radiologist yesterday, and he recommended it.

## 2013-10-23 NOTE — Telephone Encounter (Signed)
See below and advise, Thanks!

## 2013-10-23 NOTE — Telephone Encounter (Signed)
Pt informed

## 2013-10-27 ENCOUNTER — Ambulatory Visit
Admission: RE | Admit: 2013-10-27 | Discharge: 2013-10-27 | Disposition: A | Discharge status: Home / Self Care | Payer: BC Managed Care – PPO | Source: Ambulatory Visit | Attending: Endocrinology | Admitting: Endocrinology

## 2013-10-27 ENCOUNTER — Other Ambulatory Visit: Payer: Self-pay | Admitting: Endocrinology

## 2013-10-27 ENCOUNTER — Ambulatory Visit: Payer: BC Managed Care – PPO

## 2013-10-27 ENCOUNTER — Other Ambulatory Visit (HOSPITAL_COMMUNITY)
Admission: RE | Admit: 2013-10-27 | Discharge: 2013-10-27 | Disposition: A | Discharge status: Home / Self Care | Payer: BC Managed Care – PPO | Source: Ambulatory Visit | Attending: Interventional Radiology | Admitting: Interventional Radiology

## 2013-10-27 DIAGNOSIS — E042 Nontoxic multinodular goiter: Secondary | ICD-10-CM

## 2013-10-27 DIAGNOSIS — E041 Nontoxic single thyroid nodule: Secondary | ICD-10-CM | POA: Insufficient documentation

## 2013-10-28 ENCOUNTER — Other Ambulatory Visit: Payer: Self-pay | Admitting: Endocrinology

## 2013-10-28 DIAGNOSIS — E0590 Thyrotoxicosis, unspecified without thyrotoxic crisis or storm: Secondary | ICD-10-CM

## 2013-10-30 ENCOUNTER — Telehealth: Payer: Self-pay | Admitting: Endocrinology

## 2013-10-30 NOTE — Telephone Encounter (Signed)
Got the results of her biopsy from [place] and no cancer is found and the radioactive iodine treatment is ready to proceed. How can she start these treatments?

## 2013-10-30 NOTE — Telephone Encounter (Signed)
Pt informed that [facility] will contact about radioactive Iodine treatment. Pt advised to follow up with Dr. Loanne Drilling 6 to 8 weeks after radioactive iodine treatment.

## 2013-11-05 ENCOUNTER — Encounter (HOSPITAL_COMMUNITY)
Admission: RE | Admit: 2013-11-05 | Discharge: 2013-11-05 | Disposition: A | Discharge status: Home / Self Care | Payer: BC Managed Care – PPO | Source: Ambulatory Visit | Attending: Endocrinology | Admitting: Endocrinology

## 2013-11-05 DIAGNOSIS — E0590 Thyrotoxicosis, unspecified without thyrotoxic crisis or storm: Secondary | ICD-10-CM

## 2013-11-05 MED ORDER — SODIUM IODIDE I 131 CAPSULE
42.6000 | Freq: Once | INTRAVENOUS | Status: AC | PRN
  Administered 2013-11-05: 42.6 via ORAL

## 2013-11-12 ENCOUNTER — Ambulatory Visit (INDEPENDENT_AMBULATORY_CARE_PROVIDER_SITE_OTHER): Payer: BC Managed Care – PPO | Admitting: Endocrinology

## 2013-11-12 ENCOUNTER — Encounter: Payer: Self-pay | Admitting: Endocrinology

## 2013-11-12 VITALS — BP 112/62 | HR 94 | Temp 97.7°F | Ht 63.0 in | Wt 183.0 lb

## 2013-11-12 DIAGNOSIS — E042 Nontoxic multinodular goiter: Secondary | ICD-10-CM

## 2013-11-12 DIAGNOSIS — E0590 Thyrotoxicosis, unspecified without thyrotoxic crisis or storm: Secondary | ICD-10-CM

## 2013-11-12 NOTE — Progress Notes (Signed)
Subjective:    Patient ID: Jenny Cardenas, female    DOB: [birth year], 63 y.o.   MRN: [id number]  HPI Pt had I-131 rx for hyperthyroidism due to a large multinodular goiter, in late march, 2015.  pt states she feels well in general.  Edema is better. Past Medical History  Diagnosis Date  . Hypertension   . Hyperlipidemia   . Obesity   . Depression   . Carpal tunnel syndrome   . Anxiety   . Cataract   . Ulcer     Past Surgical History  Procedure Laterality Date  . Cesarean section  1976, 1983, 1984  . Abdominal hysterectomy  1996    History   Social History  . Marital Status: Widowed    Spouse Name: N/A    Number of Children: N/A  . Years of Education: N/A   Occupational History  . Not on file.   Social History Main Topics  . Smoking status: Former Research scientist (life sciences)  . Smokeless tobacco: Former Systems developer    Quit date: 08/17/2010     Comment: smokes about 4 cigs per day  . Alcohol Use: Yes     Comment: rare glass of wine   . Drug Use: No  . Sexual Activity: Not Currently   Other Topics Concern  . Not on file   Social History Narrative  . No narrative on file    Current Outpatient Prescriptions on File Prior to Visit  Medication Sig Dispense Refill  . ALPRAZolam (XANAX) 0.5 MG tablet TAKE 1 TABLET THREE TIMES A DAY AS NEEDED FOR ANXIETY  90 tablet  5  . Aspirin-Acetaminophen-Caffeine (MIGRAINE RELIEF PO) Take by mouth as needed.      . benazepril-hydrochlorthiazide (LOTENSIN HCT) 20-12.5 MG per tablet TAKE 1 TABLET BY MOUTH DAILY.  30 tablet  11  . diphenhydramine-acetaminophen (TYLENOL PM) 25-500 MG TABS Take 1 tablet by mouth at bedtime as needed.        Marland Kitchen GARCINIA CAMBOGIA-CHROMIUM PO Take by mouth daily.      . Multiple Vitamin (MULTIVITAMIN) tablet Take 1 tablet by mouth daily.      Marland Kitchen oxyCODONE-acetaminophen (ROXICET) 5-325 MG per tablet Take 1 tablet by mouth every 8 (eight) hours as needed for pain.  30 tablet  0  . traZODone (DESYREL) 50 MG tablet Take 50 mg by mouth  at bedtime as needed for sleep.      . valACYclovir (VALTREX) 500 MG tablet Take 1 tablet (500 mg total) by mouth daily.  30 tablet  11  . Vortioxetine HBr (BRINTELLIX) 20 MG TABS Take 1 tablet by mouth every morning.  30 tablet  11   No current facility-administered medications on file prior to visit.    Allergies  Allergen Reactions  . Other     Etholine Oxide: causes itching and hives    Family History  Problem Relation Age of Onset  . COPD Mother   . Depression Mother   . Hypertension Mother   . Hyperlipidemia Mother   . Alzheimer's disease Father   . Heart disease Father   . Colon cancer Neg Hx     BP 112/62  Pulse 94  Temp(Src) 97.7 F (36.5 C) (Oral)  Ht 5\' 3"  (1.6 m)  Wt 183 lb (83.008 kg)  BMI 32.43 kg/m2  SpO2 97%   Review of Systems Denies fever.      Objective:   Physical Exam VITAL SIGNS:  See vs page GENERAL: no distress NECK: thyroid is 10  times normal size (R>L), with multinodular surface.        Assessment & Plan:  Multinodular goiter, unchanged Hyperthyroidism, s/p I-131 rx

## 2013-11-12 NOTE — Patient Instructions (Signed)
No further treatment is needed now.   Please come back for a follow-up appointment in 6 weeks.

## 2013-11-13 ENCOUNTER — Ambulatory Visit: Payer: BC Managed Care – PPO | Admitting: Endocrinology

## 2013-12-12 ENCOUNTER — Other Ambulatory Visit: Payer: Self-pay | Admitting: Internal Medicine

## 2013-12-24 ENCOUNTER — Ambulatory Visit (INDEPENDENT_AMBULATORY_CARE_PROVIDER_SITE_OTHER): Payer: BC Managed Care – PPO | Admitting: Endocrinology

## 2013-12-24 ENCOUNTER — Encounter: Payer: Self-pay | Admitting: Endocrinology

## 2013-12-24 VITALS — BP 122/60 | HR 78 | Temp 98.3°F | Resp 14 | Ht 63.0 in | Wt 185.4 lb

## 2013-12-24 DIAGNOSIS — E0590 Thyrotoxicosis, unspecified without thyrotoxic crisis or storm: Secondary | ICD-10-CM

## 2013-12-24 LAB — T4, FREE: FREE T4: 0.97 ng/dL (ref 0.60–1.60)

## 2013-12-24 LAB — TSH: TSH: 0.28 u[IU]/mL — ABNORMAL LOW (ref 0.35–4.50)

## 2013-12-24 NOTE — Patient Instructions (Addendum)
blood tests are being requested for you today.  We'll contact you with results. Please come back for a follow-up appointment in 4 weeks.

## 2013-12-24 NOTE — Progress Notes (Signed)
Subjective:    Patient ID: Jenny Cardenas, female    DOB: [birth year], 63 y.o.   MRN: [id number]  HPI Pt had I-131 rx for hyperthyroidism due to a large multinodular goiter, in late march, 2015.  Prior to the I-131 rx. She had bx (benign nodules).  pt states she feels well in general, except for night sweats.   Past Medical History  Diagnosis Date  . Hypertension   . Hyperlipidemia   . Obesity   . Depression   . Carpal tunnel syndrome   . Anxiety   . Cataract   . Ulcer     Past Surgical History  Procedure Laterality Date  . Cesarean section  1976, 1983, 1984  . Abdominal hysterectomy  1996    History   Social History  . Marital Status: Widowed    Spouse Name: N/A    Number of Children: N/A  . Years of Education: N/A   Occupational History  . Not on file.   Social History Main Topics  . Smoking status: Former Research scientist (life sciences)  . Smokeless tobacco: Former Systems developer    Quit date: 08/17/2010     Comment: smokes about 4 cigs per day  . Alcohol Use: Yes     Comment: rare glass of wine   . Drug Use: No  . Sexual Activity: Not Currently   Other Topics Concern  . Not on file   Social History Narrative  . No narrative on file    Current Outpatient Prescriptions on File Prior to Visit  Medication Sig Dispense Refill  . ALPRAZolam (XANAX) 0.5 MG tablet TAKE 1 TABLET THREE TIMES A DAY AS NEEDED FOR ANXIETY  90 tablet  5  . Aspirin-Acetaminophen-Caffeine (MIGRAINE RELIEF PO) Take by mouth as needed.      . benazepril-hydrochlorthiazide (LOTENSIN HCT) 20-12.5 MG per tablet TAKE 1 TABLET BY MOUTH DAILY.  30 tablet  11  . BRINTELLIX 20 MG TABS TAKE 1 TABLET BY MOUTH EVERY MORNING.  30 tablet  5  . diphenhydramine-acetaminophen (TYLENOL PM) 25-500 MG TABS Take 1 tablet by mouth at bedtime as needed.        Marland Kitchen GARCINIA CAMBOGIA-CHROMIUM PO Take by mouth daily.      . Multiple Vitamin (MULTIVITAMIN) tablet Take 1 tablet by mouth daily.      . traZODone (DESYREL) 50 MG tablet Take 50 mg by  mouth at bedtime as needed for sleep.      . valACYclovir (VALTREX) 500 MG tablet Take 1 tablet (500 mg total) by mouth daily.  30 tablet  11  . oxyCODONE-acetaminophen (ROXICET) 5-325 MG per tablet Take 1 tablet by mouth every 8 (eight) hours as needed for pain.  30 tablet  0   No current facility-administered medications on file prior to visit.    Allergies  Allergen Reactions  . Other     Etholine Oxide: causes itching and hives    Family History  Problem Relation Age of Onset  . COPD Mother   . Depression Mother   . Hypertension Mother   . Hyperlipidemia Mother   . Alzheimer's disease Father   . Heart disease Father   . Colon cancer Neg Hx     BP 122/60  Pulse 78  Temp(Src) 98.3 F (36.8 C)  Resp 14  Ht 5\' 3"  (1.6 m)  Wt 185 lb 6.4 oz (84.097 kg)  BMI 32.85 kg/m2  SpO2 95%   Review of Systems Pt says the neck swelling is less now.  Objective:   Physical Exam VITAL SIGNS:  See vs page GENERAL: no distress NECK: thyroid is 5 times normal size (R>L), with multinodular surface.     Lab Results  Component Value Date   TSH 0.28* 12/24/2013      Assessment & Plan:  Hyperthyroidism: not better yet

## 2014-01-15 ENCOUNTER — Other Ambulatory Visit: Payer: Self-pay | Admitting: Internal Medicine

## 2014-01-21 ENCOUNTER — Ambulatory Visit (INDEPENDENT_AMBULATORY_CARE_PROVIDER_SITE_OTHER): Payer: BC Managed Care – PPO | Admitting: Endocrinology

## 2014-01-21 ENCOUNTER — Encounter: Payer: Self-pay | Admitting: Endocrinology

## 2014-01-21 VITALS — BP 108/70 | HR 82 | Temp 98.6°F | Ht 63.0 in | Wt 185.0 lb

## 2014-01-21 DIAGNOSIS — E1169 Type 2 diabetes mellitus with other specified complication: Secondary | ICD-10-CM

## 2014-01-21 DIAGNOSIS — IMO0002 Reserved for concepts with insufficient information to code with codable children: Secondary | ICD-10-CM

## 2014-01-21 DIAGNOSIS — E0590 Thyrotoxicosis, unspecified without thyrotoxic crisis or storm: Secondary | ICD-10-CM

## 2014-01-21 DIAGNOSIS — E1165 Type 2 diabetes mellitus with hyperglycemia: Secondary | ICD-10-CM

## 2014-01-21 LAB — T4, FREE: FREE T4: 0.95 ng/dL (ref 0.60–1.60)

## 2014-01-21 LAB — TSH: TSH: 0.78 u[IU]/mL (ref 0.35–4.50)

## 2014-01-21 LAB — HEMOGLOBIN A1C: HEMOGLOBIN A1C: 6.5 % (ref 4.6–6.5)

## 2014-01-21 NOTE — Progress Notes (Signed)
Subjective:    Patient ID: Jenny Cardenas, female    DOB: Oct 18, 1950, 63 y.o.   MRN: [id number]  HPI Pt had I-131 rx for hyperthyroidism due to a large multinodular goiter, in late march, 2015.  Prior to the I-131 rx, she had bx (benign nodules).  pt states she feels well in general, except for insomnia.   Past Medical History  Diagnosis Date  . Hypertension   . Hyperlipidemia   . Obesity   . Depression   . Carpal tunnel syndrome   . Anxiety   . Cataract   . Ulcer     Past Surgical History  Procedure Laterality Date  . Cesarean section  1976, 1983, 1984  . Abdominal hysterectomy  1996    History   Social History  . Marital Status: Widowed    Spouse Name: N/A    Number of Children: N/A  . Years of Education: N/A   Occupational History  . Not on file.   Social History Main Topics  . Smoking status: Former Research scientist (life sciences)  . Smokeless tobacco: Former Systems developer    Quit date: 08/17/2010     Comment: smokes about 4 cigs per day  . Alcohol Use: Yes     Comment: rare glass of wine   . Drug Use: No  . Sexual Activity: Not Currently   Other Topics Concern  . Not on file   Social History Narrative  . No narrative on file    Current Outpatient Prescriptions on File Prior to Visit  Medication Sig Dispense Refill  . ALPRAZolam (XANAX) 0.5 MG tablet TAKE 1 TABLET THREE TIMES A DAY AS NEEDED FOR ANXIETY  90 tablet  5  . Aspirin-Acetaminophen-Caffeine (MIGRAINE RELIEF PO) Take by mouth as needed.      . benazepril-hydrochlorthiazide (LOTENSIN HCT) 20-12.5 MG per tablet TAKE 1 TABLET BY MOUTH DAILY.  30 tablet  11  . BRINTELLIX 20 MG TABS TAKE 1 TABLET BY MOUTH EVERY MORNING.  30 tablet  5  . diphenhydramine-acetaminophen (TYLENOL PM) 25-500 MG TABS Take 1 tablet by mouth at bedtime as needed.        Marland Kitchen GARCINIA CAMBOGIA-CHROMIUM PO Take by mouth daily.      . Multiple Vitamin (MULTIVITAMIN) tablet Take 1 tablet by mouth daily.      Marland Kitchen oxyCODONE-acetaminophen (ROXICET) 5-325 MG per tablet  Take 1 tablet by mouth every 8 (eight) hours as needed for pain.  30 tablet  0  . traZODone (DESYREL) 50 MG tablet TAKE 1/2 TO 1 TABLET BY MOUTH AT BEDTIME AS NEEDED.  90 tablet  3  . valACYclovir (VALTREX) 500 MG tablet Take 1 tablet (500 mg total) by mouth daily.  30 tablet  11   No current facility-administered medications on file prior to visit.    Allergies  Allergen Reactions  . Other     Etholine Oxide: causes itching and hives    Family History  Problem Relation Age of Onset  . COPD Mother   . Depression Mother   . Hypertension Mother   . Hyperlipidemia Mother   . Alzheimer's disease Father   . Heart disease Father   . Colon cancer Neg Hx     BP 108/70  Pulse 82  Temp(Src) 98.6 F (37 C) (Oral)  Ht 5\' 3"  (1.6 m)  Wt 185 lb (83.915 kg)  BMI 32.78 kg/m2  SpO2 93%  Review of Systems Depression persists.  She has gained a few lbs.      Objective:  Physical Exam VITAL SIGNS:  See vs page GENERAL: no distress NECK: thyroid is 5 times normal size (R>L), with multinodular surface.    Lab Results  Component Value Date   TSH 0.78 01/21/2014      Assessment & Plan:  Multinodular goiter, clinically unchanged Hyperthyroidism, resolved with I-131 rx.  We'll need to continue to follow.    Patient is advised the following: Patient Instructions  blood tests are being requested for you today.  We'll contact you with results. Please come back for a follow-up appointment in 1 month.

## 2014-01-21 NOTE — Patient Instructions (Addendum)
blood tests are being requested for you today.  We'll contact you with results. Please come back for a follow-up appointment in 1 month.    

## 2014-02-19 ENCOUNTER — Ambulatory Visit (INDEPENDENT_AMBULATORY_CARE_PROVIDER_SITE_OTHER): Payer: BC Managed Care – PPO | Admitting: Endocrinology

## 2014-02-19 ENCOUNTER — Encounter: Payer: Self-pay | Admitting: Endocrinology

## 2014-02-19 VITALS — BP 110/60 | HR 74 | Temp 98.2°F | Ht 63.0 in | Wt 187.0 lb

## 2014-02-19 DIAGNOSIS — E0590 Thyrotoxicosis, unspecified without thyrotoxic crisis or storm: Secondary | ICD-10-CM

## 2014-02-19 LAB — T4, FREE: Free T4: 0.99 ng/dL (ref 0.60–1.60)

## 2014-02-19 LAB — TSH: TSH: 0.22 u[IU]/mL — ABNORMAL LOW (ref 0.35–4.50)

## 2014-02-19 NOTE — Progress Notes (Signed)
Subjective:    Patient ID: Jenny Cardenas, female    DOB: 1951/04/16, 63 y.o.   MRN: [id number]  HPI Pt had I-131 rx for hyperthyroidism due to a large multinodular goiter, in march, 2015.  Prior to the I-131 rx, she had bx (benign nodules).  pt states she has weight gain. Past Medical History  Diagnosis Date  . Hypertension   . Hyperlipidemia   . Obesity   . Depression   . Carpal tunnel syndrome   . Anxiety   . Cataract   . Ulcer     Past Surgical History  Procedure Laterality Date  . Cesarean section  1976, 1983, 1984  . Abdominal hysterectomy  1996    History   Social History  . Marital Status: Widowed    Spouse Name: N/A    Number of Children: N/A  . Years of Education: N/A   Occupational History  . Not on file.   Social History Main Topics  . Smoking status: Former Research scientist (life sciences)  . Smokeless tobacco: Former Systems developer    Quit date: 08/17/2010     Comment: smokes about 4 cigs per day  . Alcohol Use: Yes     Comment: rare glass of wine   . Drug Use: No  . Sexual Activity: Not Currently   Other Topics Concern  . Not on file   Social History Narrative  . No narrative on file    Current Outpatient Prescriptions on File Prior to Visit  Medication Sig Dispense Refill  . ALPRAZolam (XANAX) 0.5 MG tablet TAKE 1 TABLET THREE TIMES A DAY AS NEEDED FOR ANXIETY  90 tablet  5  . Aspirin-Acetaminophen-Caffeine (MIGRAINE RELIEF PO) Take by mouth as needed.      . benazepril-hydrochlorthiazide (LOTENSIN HCT) 20-12.5 MG per tablet TAKE 1 TABLET BY MOUTH DAILY.  30 tablet  11  . BRINTELLIX 20 MG TABS TAKE 1 TABLET BY MOUTH EVERY MORNING.  30 tablet  5  . diphenhydramine-acetaminophen (TYLENOL PM) 25-500 MG TABS Take 1 tablet by mouth at bedtime as needed.        Marland Kitchen GARCINIA CAMBOGIA-CHROMIUM PO Take by mouth daily.      . Multiple Vitamin (MULTIVITAMIN) tablet Take 1 tablet by mouth daily.      Marland Kitchen oxyCODONE-acetaminophen (ROXICET) 5-325 MG per tablet Take 1 tablet by mouth every 8  (eight) hours as needed for pain.  30 tablet  0  . traZODone (DESYREL) 50 MG tablet TAKE 1/2 TO 1 TABLET BY MOUTH AT BEDTIME AS NEEDED.  90 tablet  3  . valACYclovir (VALTREX) 500 MG tablet Take 1 tablet (500 mg total) by mouth daily.  30 tablet  11   No current facility-administered medications on file prior to visit.    Allergies  Allergen Reactions  . Other     Etholine Oxide: causes itching and hives    Family History  Problem Relation Age of Onset  . COPD Mother   . Depression Mother   . Hypertension Mother   . Hyperlipidemia Mother   . Alzheimer's disease Father   . Heart disease Father   . Colon cancer Neg Hx     BP 110/60  Pulse 74  Temp(Src) 98.2 F (36.8 C) (Oral)  Ht 5\' 3"  (1.6 m)  Wt 187 lb (84.823 kg)  BMI 33.13 kg/m2  SpO2 96%   Review of Systems She has arthralgias.    Objective:   Physical Exam VITAL SIGNS:  See vs page.   GENERAL: no  distress. NECK: thyroid is 5 times normal size (R>L), with multinodular surface.    Lab Results  Component Value Date   TSH 0.22* 02/19/2014      Assessment & Plan:  Hyperthyroidism, persistent despite I-131 rx.  It may still improve with time.   Goiter: persistent after I-131 rx  Patient is advised the following: Patient Instructions  blood tests are being requested for you today.  We'll contact you with results. Please come back for a follow-up appointment in 6 weeks.

## 2014-02-19 NOTE — Patient Instructions (Signed)
blood tests are being requested for you today.  We'll contact you with results. Please come back for a follow-up appointment in 6 weeks.

## 2014-03-15 ENCOUNTER — Ambulatory Visit: Payer: BC Managed Care – PPO | Admitting: Internal Medicine

## 2014-04-02 ENCOUNTER — Encounter: Payer: Self-pay | Admitting: Endocrinology

## 2014-04-02 ENCOUNTER — Ambulatory Visit (INDEPENDENT_AMBULATORY_CARE_PROVIDER_SITE_OTHER): Payer: BC Managed Care – PPO | Admitting: Endocrinology

## 2014-04-02 VITALS — BP 116/62 | HR 80 | Temp 98.5°F | Ht 63.0 in | Wt 192.0 lb

## 2014-04-02 DIAGNOSIS — E1165 Type 2 diabetes mellitus with hyperglycemia: Secondary | ICD-10-CM

## 2014-04-02 DIAGNOSIS — E1169 Type 2 diabetes mellitus with other specified complication: Secondary | ICD-10-CM

## 2014-04-02 DIAGNOSIS — E0590 Thyrotoxicosis, unspecified without thyrotoxic crisis or storm: Secondary | ICD-10-CM

## 2014-04-02 DIAGNOSIS — IMO0002 Reserved for concepts with insufficient information to code with codable children: Secondary | ICD-10-CM

## 2014-04-02 LAB — TSH: TSH: 0.85 u[IU]/mL (ref 0.35–4.50)

## 2014-04-02 LAB — T4, FREE: FREE T4: 0.98 ng/dL (ref 0.60–1.60)

## 2014-04-02 NOTE — Progress Notes (Signed)
Subjective:    Patient ID: Jenny Cardenas, female    DOB: 1950-11-25, 63 y.o.   MRN: [id number]  HPI Pt had I-131 rx for hyperthyroidism due to a large multinodular goiter, in march, 2015.  Prior to the I-131 rx, she had bx (benign nodules).  pt states she has weight gain. Past Medical History  Diagnosis Date  . Hypertension   . Hyperlipidemia   . Obesity   . Depression   . Carpal tunnel syndrome   . Anxiety   . Cataract   . Ulcer     Past Surgical History  Procedure Laterality Date  . Cesarean section  1976, 1983, 1984  . Abdominal hysterectomy  1996    History   Social History  . Marital Status: Widowed    Spouse Name: N/A    Number of Children: N/A  . Years of Education: N/A   Occupational History  . Not on file.   Social History Main Topics  . Smoking status: Former Research scientist (life sciences)  . Smokeless tobacco: Former Systems developer    Quit date: 08/17/2010     Comment: smokes about 4 cigs per day  . Alcohol Use: Yes     Comment: rare glass of wine   . Drug Use: No  . Sexual Activity: Not Currently   Other Topics Concern  . Not on file   Social History Narrative  . No narrative on file    Current Outpatient Prescriptions on File Prior to Visit  Medication Sig Dispense Refill  . ALPRAZolam (XANAX) 0.5 MG tablet TAKE 1 TABLET THREE TIMES A DAY AS NEEDED FOR ANXIETY  90 tablet  5  . Aspirin-Acetaminophen-Caffeine (MIGRAINE RELIEF PO) Take by mouth as needed.      . benazepril-hydrochlorthiazide (LOTENSIN HCT) 20-12.5 MG per tablet TAKE 1 TABLET BY MOUTH DAILY.  30 tablet  11  . BRINTELLIX 20 MG TABS TAKE 1 TABLET BY MOUTH EVERY MORNING.  30 tablet  5  . diphenhydramine-acetaminophen (TYLENOL PM) 25-500 MG TABS Take 1 tablet by mouth at bedtime as needed.        Marland Kitchen GARCINIA CAMBOGIA-CHROMIUM PO Take by mouth daily.      . Multiple Vitamin (MULTIVITAMIN) tablet Take 1 tablet by mouth daily.      Marland Kitchen oxyCODONE-acetaminophen (ROXICET) 5-325 MG per tablet Take 1 tablet by mouth every 8  (eight) hours as needed for pain.  30 tablet  0  . traZODone (DESYREL) 50 MG tablet TAKE 1/2 TO 1 TABLET BY MOUTH AT BEDTIME AS NEEDED.  90 tablet  3  . valACYclovir (VALTREX) 500 MG tablet Take 1 tablet (500 mg total) by mouth daily.  30 tablet  11   No current facility-administered medications on file prior to visit.    Allergies  Allergen Reactions  . Other     Etholine Oxide: causes itching and hives    Family History  Problem Relation Age of Onset  . COPD Mother   . Depression Mother   . Hypertension Mother   . Hyperlipidemia Mother   . Alzheimer's disease Father   . Heart disease Father   . Colon cancer Neg Hx     BP 116/62  Pulse 80  Temp(Src) 98.5 F (36.9 C) (Oral)  Ht 5\' 3"  (1.6 m)  Wt 192 lb (87.091 kg)  BMI 34.02 kg/m2  SpO2 97%  Review of Systems She has fatigue and insomnia.    Objective:   Physical Exam VITAL SIGNS:  See vs page GENERAL: no distress  NECK: thyroid is 5 times normal size (R>>L), with multinodular surface.    Lab Results  Component Value Date   TSH 0.85 04/02/2014       Assessment & Plan:  Hyperthyroidism, resolved with I-131 rx, but she is at risk for recurrence Multinodular goiter, clinically smaller.   Patient is advised the following: Patient Instructions  blood tests are being requested for you today.  We'll contact you with results. Please come back for a follow-up appointment in 2 months.

## 2014-04-02 NOTE — Patient Instructions (Addendum)
blood tests are being requested for you today.  We'll contact you with results. Please come back for a follow-up appointment in 2 months.

## 2014-04-14 ENCOUNTER — Other Ambulatory Visit: Payer: Self-pay

## 2014-04-14 DIAGNOSIS — Z1231 Encounter for screening mammogram for malignant neoplasm of breast: Secondary | ICD-10-CM

## 2014-06-02 ENCOUNTER — Ambulatory Visit: Payer: BC Managed Care – PPO | Admitting: Endocrinology

## 2014-07-12 ENCOUNTER — Ambulatory Visit: Payer: BC Managed Care – PPO

## 2014-09-29 ENCOUNTER — Ambulatory Visit
Admission: RE | Admit: 2014-09-29 | Discharge: 2014-09-29 | Disposition: A | Discharge status: Home / Self Care | Payer: BLUE CROSS/BLUE SHIELD | Source: Ambulatory Visit

## 2014-09-29 DIAGNOSIS — Z1231 Encounter for screening mammogram for malignant neoplasm of breast: Secondary | ICD-10-CM

## 2015-02-07 ENCOUNTER — Other Ambulatory Visit: Payer: Self-pay

## 2015-04-10 ENCOUNTER — Emergency Department
Admission: EM | Admit: 2015-04-10 | Discharge: 2015-04-10 | Disposition: A | Discharge status: Home / Self Care | Payer: Managed Care, Other (non HMO) | Attending: Emergency Medicine | Admitting: Emergency Medicine

## 2015-04-10 ENCOUNTER — Emergency Department: Payer: Managed Care, Other (non HMO)

## 2015-04-10 ENCOUNTER — Encounter: Payer: Self-pay | Admitting: Emergency Medicine

## 2015-04-10 DIAGNOSIS — Y9289 Other specified places as the place of occurrence of the external cause: Secondary | ICD-10-CM | POA: Diagnosis not present

## 2015-04-10 DIAGNOSIS — W109XXA Fall (on) (from) unspecified stairs and steps, initial encounter: Secondary | ICD-10-CM | POA: Insufficient documentation

## 2015-04-10 DIAGNOSIS — S4991XA Unspecified injury of right shoulder and upper arm, initial encounter: Secondary | ICD-10-CM | POA: Diagnosis not present

## 2015-04-10 DIAGNOSIS — S300XXA Contusion of lower back and pelvis, initial encounter: Secondary | ICD-10-CM | POA: Insufficient documentation

## 2015-04-10 DIAGNOSIS — Z87891 Personal history of nicotine dependence: Secondary | ICD-10-CM | POA: Diagnosis not present

## 2015-04-10 DIAGNOSIS — Y998 Other external cause status: Secondary | ICD-10-CM | POA: Diagnosis not present

## 2015-04-10 DIAGNOSIS — Y9389 Activity, other specified: Secondary | ICD-10-CM | POA: Diagnosis not present

## 2015-04-10 DIAGNOSIS — S39012A Strain of muscle, fascia and tendon of lower back, initial encounter: Secondary | ICD-10-CM | POA: Diagnosis not present

## 2015-04-10 DIAGNOSIS — E119 Type 2 diabetes mellitus without complications: Secondary | ICD-10-CM | POA: Diagnosis not present

## 2015-04-10 DIAGNOSIS — Z79899 Other long term (current) drug therapy: Secondary | ICD-10-CM | POA: Insufficient documentation

## 2015-04-10 DIAGNOSIS — S20229A Contusion of unspecified back wall of thorax, initial encounter: Secondary | ICD-10-CM

## 2015-04-10 DIAGNOSIS — S60211A Contusion of right wrist, initial encounter: Secondary | ICD-10-CM | POA: Diagnosis not present

## 2015-04-10 DIAGNOSIS — I10 Essential (primary) hypertension: Secondary | ICD-10-CM | POA: Insufficient documentation

## 2015-04-10 DIAGNOSIS — S3992XA Unspecified injury of lower back, initial encounter: Secondary | ICD-10-CM | POA: Diagnosis present

## 2015-04-10 DIAGNOSIS — W19XXXA Unspecified fall, initial encounter: Secondary | ICD-10-CM

## 2015-04-10 DIAGNOSIS — Z7901 Long term (current) use of anticoagulants: Secondary | ICD-10-CM | POA: Diagnosis not present

## 2015-04-10 HISTORY — DX: Type 2 diabetes mellitus without complications: E11.9

## 2015-04-10 HISTORY — DX: Disorder of thyroid, unspecified: E07.9

## 2015-04-10 HISTORY — DX: Unspecified fall, initial encounter: W19.XXXA

## 2015-04-10 MED ORDER — HYDROCODONE-ACETAMINOPHEN 5-325 MG PO TABS: 1.0000 | ORAL_TABLET | ORAL | Status: DC | PRN

## 2015-04-10 MED ORDER — HYDROCODONE-ACETAMINOPHEN 5-325 MG PO TABS
1.0000 | ORAL_TABLET | Freq: Once | ORAL | Status: AC
  Administered 2015-04-10: 1 via ORAL
  Filled 2015-04-10: qty 1

## 2015-04-10 NOTE — ED Provider Notes (Signed)
[facility] Emergency Department Provider Note  ____________________________________________  Time seen: Approximately 6:19 PM  I have reviewed the triage vital signs and the nursing notes.   HISTORY  Chief Complaint Fall   HPI Jenny Cardenas is a 64 y.o. female is here after falling today around noon. Patient states that she was wearing a rib rubber boots that were wet when she slid down 6 stairs. She states she fell completely down on her bottom midway down steps. She was ambulatory afterwards. She denies hitting her head or any loss of consciousness. Her main pain is in the middle of her back and her tailbone. She also has pain in her right wrist from trying to catch herself. And also she is hurting in her right shoulder. She denies any lacerations. She denies any vision changes, nausea, or vomiting. She denies any paresthesias into her lower extremities She took one Tylenol with Codeine that she had at home which was an old prescription in the last one in the bottle. It is helped a little.Currently she rates her pain really at 10. Range of motion and taking deep breaths increases her pain.   Past Medical History  Diagnosis Date  . Hypertension   . Hyperlipidemia   . Obesity   . Depression   . Carpal tunnel syndrome   . Anxiety   . Cataract   . Ulcer   . Diabetes mellitus without complication   . Thyroid disease     Patient Active Problem List   Diagnosis Date Noted  . Hyperthyroidism 09/28/2013  . Multinodular goiter 09/28/2013  . Rash and nonspecific skin eruption 01/31/2013  . Rash and other nonspecific skin eruption 10/16/2010  . DIAB W/OTH MANIFESTS TYPE II/UNS TYPE UNCNTRL 04/10/2010  . OSTEOPENIA 08/27/2008  . UNS ADVRS EFF UNS RX MEDICINAL&BIOLOGICAL SBSTNC 09/18/2007  . CARPAL TUNNEL SYNDROME 03/04/2007  . ACTINIC KERATOSIS 03/04/2007  . DEPRESSION 02/13/2007  . HYPERLIPIDEMIA 12/25/2006  . OBESITY 12/25/2006  . HYPERTENSION 12/25/2006     Past Surgical History  Procedure Laterality Date  . Cesarean section  1976, 1983, 1984  . Abdominal hysterectomy  1996    Current Outpatient Rx  Name  Route  Sig  Dispense  Refill  . ALPRAZolam (XANAX) 0.5 MG tablet      TAKE 1 TABLET THREE TIMES A DAY AS NEEDED FOR ANXIETY   90 tablet   5   . benazepril-hydrochlorthiazide (LOTENSIN HCT) 20-12.5 MG per tablet      TAKE 1 TABLET BY MOUTH DAILY.   30 tablet   11   . BRINTELLIX 20 MG TABS      TAKE 1 TABLET BY MOUTH EVERY MORNING.   30 tablet   5   . diphenhydramine-acetaminophen (TYLENOL PM) 25-500 MG TABS   Oral   Take 1 tablet by mouth at bedtime as needed.           Marland Kitchen GARCINIA CAMBOGIA-CHROMIUM PO   Oral   Take by mouth daily.         Marland Kitchen HYDROcodone-acetaminophen (NORCO/VICODIN) 5-325 MG per tablet   Oral   Take 1 tablet by mouth every 4 (four) hours as needed for moderate pain.   20 tablet   0   . Multiple Vitamin (MULTIVITAMIN) tablet   Oral   Take 1 tablet by mouth daily.         . traZODone (DESYREL) 50 MG tablet      TAKE 1/2 TO 1 TABLET BY MOUTH AT BEDTIME AS  NEEDED.   90 tablet   3   . valACYclovir (VALTREX) 500 MG tablet   Oral   Take 1 tablet (500 mg total) by mouth daily.   30 tablet   11     Allergies Other  Family History  Problem Relation Age of Onset  . COPD Mother   . Depression Mother   . Hypertension Mother   . Hyperlipidemia Mother   . Alzheimer's disease Father   . Heart disease Father   . Colon cancer Neg Hx     Social History Social History  Substance Use Topics  . Smoking status: Former Research scientist (life sciences)  . Smokeless tobacco: Former Systems developer    Quit date: 08/17/2010     Comment: smokes about 4 cigs per day  . Alcohol Use: Yes     Comment: rare glass of wine     Review of Systems Constitutional: No fever/chills Eyes: No visual changes. ENT: No sore throat. Cardiovascular: Denies chest pain. Respiratory: Denies shortness of breath. Gastrointestinal: No abdominal  pain.  No nausea, no vomiting.  Musculoskeletal: Positive for back pain. Skin: Negative for rash. Neurological: Negative for headaches, focal weakness or numbness.  10-point ROS otherwise negative.  ____________________________________________   PHYSICAL EXAM:  VITAL SIGNS: ED Triage Vitals  Enc Vitals Group     BP 04/10/15 1748 143/77 mmHg     Pulse Rate 04/10/15 1748 92     Resp 04/10/15 1748 18     Temp 04/10/15 1748 99.4 F (37.4 C)     Temp Source 04/10/15 1748 Oral     SpO2 04/10/15 1748 95 %     Weight 04/10/15 1748 205 lb (92.987 kg)     Height 04/10/15 1748 '5\' 3"'$  (1.6 m)     Head Cir --      Peak Flow --      Pain Score 04/10/15 1749 3     Pain Loc --      Pain Edu? --      Excl. in [place]? --     Constitutional: Alert and oriented. Well appearing and in no acute distress. Eyes: Conjunctivae are normal. PERRL. EOMI. Head: Atraumatic. Nose: No congestion/rhinnorhea. Neck: No stridor.  Nontender cervical spine to palpation Cardiovascular: Normal rate, regular rhythm. Grossly normal heart sounds.  Good peripheral circulation. Respiratory: Normal respiratory effort.  No retractions. Lungs CTAB. Gastrointestinal: Soft and nontender. No distention Musculoskeletal: Back exam no gross deformity however there is moderate generalized tenderness on palpation of the lumbar spine and paravertebral muscles. There is also tenderness on palpation of the posterior anterior right shoulder and muscles trapezius and rhomboid. Range of motion is slightly restricted secondary to pain with some minimal crepitus noted. Right wrist no swelling is noted. Range of motion is slightly restricted. Motor sensory function intact. No lower extremity tenderness nor edema.  No joint effusions. Neurologic:  Normal speech and language. No gross focal neurologic deficits are appreciated. No gait instability. Skin:  Skin is warm, dry and intact. No rash noted. No abrasions or ecchymosis is  noted. Psychiatric: Mood and affect are normal. Speech and behavior are normal.  ____________________________________________   LABS (all labs ordered are listed, but only abnormal results are displayed)  Labs Reviewed - No data to display   RADIOLOGY  Lumbar spine x-rays per radiologist no acute findings. Right shoulder x-ray per radiologist mild degenerative changes Right wrist x-ray per radiologist showed no acute findings I, Johnn Hai, personally viewed and evaluated these images (plain radiographs) as part  of my medical decision making.   ____________________________________________   PROCEDURES  Procedure(s) performed: None  Critical Care performed: No  ____________________________________________   INITIAL IMPRESSION / ASSESSMENT AND PLAN / ED COURSE  Pertinent labs & imaging results that were available during my care of the patient were reviewed by me and considered in my medical decision making (see chart for details)  Patient was reassured there was no fractures. She was told however that with the amount of degenerative changes should be very sore for the next 3-4 days. She is given a prescription for Norco as needed for pain. She is also given a note for work through August 31.   ____________________________________________   FINAL CLINICAL IMPRESSION(S) / ED DIAGNOSES  Final diagnoses:  Contusion of back, unspecified laterality, initial encounter  Lumbar strain, initial encounter  Contusion, wrist, right, initial encounter      Johnn Hai, PA-C 04/10/15 2013  Earleen Newport, MD 04/10/15 2029

## 2015-04-10 NOTE — ED Notes (Addendum)
Pt reports falling about 6 stairs with red rubber boots that were wet that she states caused her to fall. States she fell backwards on her bottom midway down stairs. Ambulatory after. States it happened about noon today.  Denies hitting head. Pt reports pain to middle back and tail bone. No bruising or abrasions noted to back or right hand. Pt states I fell sore all over.

## 2015-04-20 ENCOUNTER — Other Ambulatory Visit: Payer: Self-pay | Admitting: Family Medicine

## 2015-04-20 ENCOUNTER — Ambulatory Visit
Admission: RE | Admit: 2015-04-20 | Discharge: 2015-04-20 | Disposition: A | Discharge status: Home / Self Care | Payer: Medicare Other | Source: Ambulatory Visit | Attending: Family Medicine | Admitting: Family Medicine

## 2015-04-20 DIAGNOSIS — M549 Dorsalgia, unspecified: Secondary | ICD-10-CM | POA: Diagnosis not present

## 2015-04-20 DIAGNOSIS — M5489 Other dorsalgia: Secondary | ICD-10-CM

## 2015-04-20 DIAGNOSIS — M533 Sacrococcygeal disorders, not elsewhere classified: Secondary | ICD-10-CM | POA: Diagnosis not present

## 2015-04-20 DIAGNOSIS — M7918 Myalgia, other site: Secondary | ICD-10-CM

## 2015-05-24 ENCOUNTER — Other Ambulatory Visit: Payer: Self-pay | Admitting: Orthopedic Surgery

## 2015-05-31 NOTE — Pre-Procedure Instructions (Signed)
Jenny Cardenas  05/31/2015     Your procedure is scheduled on : Thursday June 02, 2015 at 10:10 AM.  Report to [facility] Admitting at 7:10 A.M.  Call this number if you have problems the morning of surgery: [phone]   Remember:  Do not eat food or drink liquids after midnight.  Take these medicines the morning of surgery with A SIP OF WATER : Alprazolam (Xanax) if needed, Bupropion (Wellbutrin), Hydrocodone if needed, Pantoprazole (Protonix), Sertraline (Zoloft), Valacyclovir (Valtrex)   Stop taking any vitamins, herbal medications, Ibuprofen, Advil, Motrin, Aleve, etc   Do NOT take any diabetic pills the morning of your surgery (Metformin/Glucophage)  How to Manage Your Diabetes Before Surgery   Why is it important to control my blood sugar before and after surgery?   Improving blood sugar levels before and after surgery helps healing and can limit problems.  A way of improving blood sugar control is eating a healthy diet by:  - Eating less sugar and carbohydrates  - Increasing activity/exercise  - Talk with your doctor about reaching your blood sugar goals  High blood sugars (greater than 180 mg/dL) can raise your risk of infections and slow down your recovery so you will need to focus on controlling your diabetes during the weeks before surgery.  Make sure that the doctor who takes care of your diabetes knows about your planned surgery including the date and location.  How do I manage my blood sugars before surgery?   Check your blood sugar at least 4 times a day, 2 days before surgery to make sure that they are not too high or low.   Check your blood sugar the morning of your surgery when you wake up and every 2 hours until you get to the Short-Stay unit.  If your blood sugar is less than 70 mg/dL, you will need to treat for low blood sugar by:  Treat a low blood sugar (less than 70 mg/dL) with 1/2 cup of clear juice (cranberry or apple), 4 glucose  tablets, OR glucose gel.  Recheck blood sugar in 15 minutes after treatment (to make sure it is greater than 70 mg/dL).  If blood sugar is not greater than 70 mg/dL on re-check, call [phone] for further instructions.   Report your blood sugar to the Short-Stay nurse when you get to Short-Stay.  References:  [facility], 2007 "How to Manage your Diabetes Before and After Surgery".  What do I do about my diabetes medications?   Do not take oral diabetes medicines (pills) the morning of surgery.   Do not wear jewelry, make-up or nail polish.  Do not wear lotions, powders, or perfumes.    Do not shave 48 hours prior to surgery.    Do not bring valuables to the hospital.  [facility] is not responsible for any belongings or valuables.  Contacts, dentures or bridgework may not be worn into surgery.  Leave your suitcase in the car.  After surgery it may be brought to your room.  For patients admitted to the hospital, discharge time will be determined by your treatment team.  Patients discharged the day of surgery will not be allowed to drive home.   Name and phone number of your driver:    Special instructions:  Shower using CHG soap the night before and the morning of your surgery  Please read over the following fact sheets that you were given. Pain Booklet, Coughing and Deep Breathing,  MRSA Information and Surgical Site Infection Prevention

## 2015-06-01 ENCOUNTER — Ambulatory Visit (HOSPITAL_COMMUNITY)
Admission: RE | Admit: 2015-06-01 | Discharge: 2015-06-01 | Disposition: A | Discharge status: Home / Self Care | Payer: PPO | Source: Ambulatory Visit | Attending: Orthopedic Surgery | Admitting: Orthopedic Surgery

## 2015-06-01 ENCOUNTER — Encounter (HOSPITAL_COMMUNITY): Payer: Self-pay

## 2015-06-01 ENCOUNTER — Encounter (HOSPITAL_COMMUNITY)
Admission: RE | Admit: 2015-06-01 | Discharge: 2015-06-01 | Disposition: A | Discharge status: Home / Self Care | Payer: PPO | Source: Ambulatory Visit | Attending: Orthopedic Surgery | Admitting: Orthopedic Surgery

## 2015-06-01 DIAGNOSIS — Z01818 Encounter for other preprocedural examination: Secondary | ICD-10-CM | POA: Insufficient documentation

## 2015-06-01 DIAGNOSIS — Z01812 Encounter for preprocedural laboratory examination: Secondary | ICD-10-CM | POA: Diagnosis not present

## 2015-06-01 DIAGNOSIS — Z79899 Other long term (current) drug therapy: Secondary | ICD-10-CM | POA: Insufficient documentation

## 2015-06-01 DIAGNOSIS — I10 Essential (primary) hypertension: Secondary | ICD-10-CM | POA: Insufficient documentation

## 2015-06-01 DIAGNOSIS — E119 Type 2 diabetes mellitus without complications: Secondary | ICD-10-CM | POA: Diagnosis not present

## 2015-06-01 HISTORY — DX: Unspecified osteoarthritis, unspecified site: M19.90

## 2015-06-01 HISTORY — DX: Reserved for inherently not codable concepts without codable children: IMO0001

## 2015-06-01 HISTORY — DX: Post-traumatic stress disorder, unspecified: F43.10

## 2015-06-01 HISTORY — DX: Personal history of other infectious and parasitic diseases: Z86.19

## 2015-06-01 HISTORY — DX: Unspecified fall, initial encounter: W19.XXXA

## 2015-06-01 HISTORY — DX: Gastro-esophageal reflux disease without esophagitis: K21.9

## 2015-06-01 HISTORY — DX: Nontoxic goiter, unspecified: E04.9

## 2015-06-01 LAB — CBC WITH DIFFERENTIAL/PLATELET
BASOS ABS: 0 10*3/uL (ref 0.0–0.1)
Basophils Relative: 1 %
EOS ABS: 0.2 10*3/uL (ref 0.0–0.7)
EOS PCT: 3 %
HCT: 42.1 % (ref 36.0–46.0)
Hemoglobin: 14 g/dL (ref 12.0–15.0)
Lymphocytes Relative: 37 %
Lymphs Abs: 3.2 10*3/uL (ref 0.7–4.0)
MCH: 30 pg (ref 26.0–34.0)
MCHC: 33.3 g/dL (ref 30.0–36.0)
MCV: 90.3 fL (ref 78.0–100.0)
MONO ABS: 0.5 10*3/uL (ref 0.1–1.0)
Monocytes Relative: 6 %
Neutro Abs: 4.6 10*3/uL (ref 1.7–7.7)
Neutrophils Relative %: 53 %
PLATELETS: 266 10*3/uL (ref 150–400)
RBC: 4.66 MIL/uL (ref 3.87–5.11)
RDW: 12.6 % (ref 11.5–15.5)
WBC: 8.6 10*3/uL (ref 4.0–10.5)

## 2015-06-01 LAB — URINALYSIS, ROUTINE W REFLEX MICROSCOPIC
BILIRUBIN URINE: NEGATIVE
Glucose, UA: NEGATIVE mg/dL
Hgb urine dipstick: NEGATIVE
KETONES UR: NEGATIVE mg/dL
LEUKOCYTES UA: NEGATIVE
NITRITE: NEGATIVE
PH: 5 (ref 5.0–8.0)
PROTEIN: NEGATIVE mg/dL
Specific Gravity, Urine: 1.029 (ref 1.005–1.030)
UROBILINOGEN UA: 0.2 mg/dL (ref 0.0–1.0)

## 2015-06-01 LAB — SURGICAL PCR SCREEN
MRSA, PCR: NEGATIVE
STAPHYLOCOCCUS AUREUS: NEGATIVE

## 2015-06-01 LAB — COMPREHENSIVE METABOLIC PANEL
ALT: 19 U/L (ref 14–54)
AST: 27 U/L (ref 15–41)
Albumin: 4 g/dL (ref 3.5–5.0)
Alkaline Phosphatase: 79 U/L (ref 38–126)
Anion gap: 9 (ref 5–15)
BUN: 9 mg/dL (ref 6–20)
CHLORIDE: 104 mmol/L (ref 101–111)
CO2: 26 mmol/L (ref 22–32)
CREATININE: 0.93 mg/dL (ref 0.44–1.00)
Calcium: 8.9 mg/dL (ref 8.9–10.3)
GFR calc non Af Amer: >60 mL/min (ref >60)
Glucose, Bld: 89 mg/dL (ref 65–99)
POTASSIUM: 3.5 mmol/L (ref 3.5–5.1)
SODIUM: 139 mmol/L (ref 135–145)
Total Bilirubin: 0.4 mg/dL (ref 0.3–1.2)
Total Protein: 6.9 g/dL (ref 6.5–8.1)

## 2015-06-01 LAB — GLUCOSE, CAPILLARY: GLUCOSE-CAPILLARY: 127 mg/dL — AB (ref 65–99)

## 2015-06-01 LAB — PROTIME-INR
INR: 1.03 (ref 0.00–1.49)
PROTHROMBIN TIME: 13.7 s (ref 11.6–15.2)

## 2015-06-01 LAB — APTT: APTT: 30 s (ref 24–37)

## 2015-06-01 NOTE — Progress Notes (Signed)
PCP is Maurice Small  Patient denied having any acute cardiac or pulmonary issues  Patient informed Nurse that she does not check her blood sugars at home.

## 2015-06-01 NOTE — H&P (Signed)
PREOPERATIVE H&P  Chief Complaint: back pain  HPI: Jenny Cardenas is a 64 y.o. female who presents with ongoing pain in the upper to mid back  MRI reveals edeam c/w compression fractures at T3, T8 and T10  Patient has failed multiple forms of conservative care and continues to have pain (see office notes for additional details regarding the patient's full course of treatment)  Past Medical History  Diagnosis Date  . Hypertension   . Hyperlipidemia   . Obesity   . Depression   . Carpal tunnel syndrome   . Anxiety   . Cataract   . Ulcer   . Thyroid disease   . Shortness of breath dyspnea     pt stated its related to the back problems she has  . Fall April 10, 2015  . PTSD (post-traumatic stress disorder)   . History of shingles     On abdomen, left ring finger, and left leg; Pt takes Valtrex  . Goiter   . Diabetes mellitus without complication (HCC)     Type 2  . GERD (gastroesophageal reflux disease)   . Arthritis    Past Surgical History  Procedure Laterality Date  . Cesarean section  1976, 1983, 1984  . Abdominal hysterectomy  1996  . Colonoscopy w/ polypectomy     Social History   Social History  . Marital Status: Widowed    Spouse Name: N/A  . Number of Children: N/A  . Years of Education: N/A   Social History Main Topics  . Smoking status: Current Some Day Smoker    Types: Cigarettes  . Smokeless tobacco: Former Systems developer    Quit date: 08/17/2010     Comment: smokes about 3-4 cigs per day  . Alcohol Use: Yes     Comment: rare glass of wine   . Drug Use: No  . Sexual Activity: Not Currently   Other Topics Concern  . Not on file   Social History Narrative   Family History  Problem Relation Age of Onset  . COPD Mother   . Depression Mother   . Hypertension Mother   . Hyperlipidemia Mother   . Alzheimer's disease Father   . Heart disease Father   . Colon cancer Neg Hx    Allergies  Allergen Reactions  . Other     Etholine Oxide: causes  itching and hives   Prior to Admission medications   Medication Sig Start Date End Date Taking? Authorizing Provider  ALPRAZolam Duanne Moron) 0.5 MG tablet TAKE 1 TABLET THREE TIMES A DAY AS NEEDED FOR ANXIETY 08/18/13  Yes Ricard Dillon, MD  buPROPion (WELLBUTRIN XL) 300 MG 24 hr tablet Take 300 mg by mouth daily.   Yes Historical Provider, MD  furosemide (LASIX) 20 MG tablet Take 20 mg by mouth daily.   Yes Historical Provider, MD  HYDROcodone-acetaminophen (NORCO) 10-325 MG tablet Take 1-2 tablets by mouth every 6 (six) hours as needed for severe pain.   Yes Historical Provider, MD  HYDROcodone-acetaminophen (NORCO) 7.5-325 MG tablet Take 1-2 tablets by mouth every 6 (six) hours as needed for moderate pain.   Yes Historical Provider, MD  metFORMIN (GLUCOPHAGE-XR) 500 MG 24 hr tablet Take 500 mg by mouth every evening.   Yes Historical Provider, MD  pantoprazole (PROTONIX) 40 MG tablet Take 40 mg by mouth daily.   Yes Historical Provider, MD  pravastatin (PRAVACHOL) 10 MG tablet Take 10 mg by mouth daily.   Yes Historical Provider, MD  sertraline (ZOLOFT) 100 MG tablet Take 100 mg by mouth daily.   Yes Historical Provider, MD  tiZANidine (ZANAFLEX) 4 MG tablet Take 2-4 mg by mouth at bedtime as needed for muscle spasms.   Yes Historical Provider, MD  traZODone (DESYREL) 50 MG tablet TAKE 1/2 TO 1 TABLET BY MOUTH AT BEDTIME AS NEEDED. Patient taking differently: TAKE 1/2 TO 1 TABLET BY MOUTH AT BEDTIME AS NEEDED SLEEP   Yes Ricard Dillon, MD  valACYclovir (VALTREX) 500 MG tablet Take 1 tablet (500 mg total) by mouth daily. 04/20/13  Yes Ricard Dillon, MD  benazepril-hydrochlorthiazide (LOTENSIN HCT) 20-12.5 MG per tablet TAKE 1 TABLET BY MOUTH DAILY. Patient not taking: Reported on 05/31/2015 04/13/13   Ricard Dillon, MD  BRINTELLIX 20 MG TABS TAKE 1 TABLET BY MOUTH EVERY MORNING. Patient not taking: Reported on 05/31/2015 12/12/13   Ricard Dillon, MD  HYDROcodone-acetaminophen (NORCO/VICODIN) 5-325 MG  per tablet Take 1 tablet by mouth every 4 (four) hours as needed for moderate pain. Patient not taking: Reported on 05/31/2015 04/10/15   Johnn Hai, PA-C     All other systems have been reviewed and were otherwise negative with the exception of those mentioned in the HPI and as above.  Physical Exam: There were no vitals filed for this visit.  General: Alert, no acute distress Cardiovascular: No pedal edema Respiratory: No cyanosis, no use of accessory musculature Skin: No lesions in the area of chief complaint Neurologic: Sensation intact distally Psychiatric: Patient is competent for consent with normal mood and affect Lymphatic: No axillary or cervical lymphadenopathy  MUSCULOSKELETAL: + TTP upper to mid back  Assessment/Plan: Compression fractures Plan for Procedure(s): KYPHOPLASTY x 3   Sinclair Ship, MD 06/01/2015 4:51 PM

## 2015-06-02 ENCOUNTER — Encounter (HOSPITAL_COMMUNITY)
Admission: RE | Disposition: A | Discharge status: Home / Self Care | Payer: Self-pay | Source: Ambulatory Visit | Attending: Orthopedic Surgery

## 2015-06-02 ENCOUNTER — Ambulatory Visit (HOSPITAL_COMMUNITY): Payer: PPO

## 2015-06-02 ENCOUNTER — Ambulatory Visit (HOSPITAL_COMMUNITY): Payer: PPO | Admitting: Anesthesiology

## 2015-06-02 ENCOUNTER — Ambulatory Visit (HOSPITAL_COMMUNITY)
Admission: RE | Admit: 2015-06-02 | Discharge: 2015-06-02 | Disposition: A | Discharge status: Home / Self Care | Payer: PPO | Source: Ambulatory Visit | Attending: Orthopedic Surgery | Admitting: Orthopedic Surgery

## 2015-06-02 ENCOUNTER — Encounter (HOSPITAL_COMMUNITY): Payer: Self-pay | Admitting: *Deleted

## 2015-06-02 DIAGNOSIS — K219 Gastro-esophageal reflux disease without esophagitis: Secondary | ICD-10-CM | POA: Insufficient documentation

## 2015-06-02 DIAGNOSIS — Z7984 Long term (current) use of oral hypoglycemic drugs: Secondary | ICD-10-CM | POA: Diagnosis not present

## 2015-06-02 DIAGNOSIS — F17210 Nicotine dependence, cigarettes, uncomplicated: Secondary | ICD-10-CM | POA: Insufficient documentation

## 2015-06-02 DIAGNOSIS — S22039A Unspecified fracture of third thoracic vertebra, initial encounter for closed fracture: Secondary | ICD-10-CM | POA: Diagnosis present

## 2015-06-02 DIAGNOSIS — S22079A Unspecified fracture of T9-T10 vertebra, initial encounter for closed fracture: Secondary | ICD-10-CM | POA: Diagnosis not present

## 2015-06-02 DIAGNOSIS — Z419 Encounter for procedure for purposes other than remedying health state, unspecified: Secondary | ICD-10-CM

## 2015-06-02 DIAGNOSIS — E785 Hyperlipidemia, unspecified: Secondary | ICD-10-CM | POA: Diagnosis not present

## 2015-06-02 DIAGNOSIS — I10 Essential (primary) hypertension: Secondary | ICD-10-CM | POA: Insufficient documentation

## 2015-06-02 DIAGNOSIS — S22069A Unspecified fracture of T7-T8 vertebra, initial encounter for closed fracture: Secondary | ICD-10-CM | POA: Diagnosis not present

## 2015-06-02 DIAGNOSIS — W19XXXA Unspecified fall, initial encounter: Secondary | ICD-10-CM | POA: Diagnosis not present

## 2015-06-02 DIAGNOSIS — E119 Type 2 diabetes mellitus without complications: Secondary | ICD-10-CM | POA: Diagnosis not present

## 2015-06-02 HISTORY — PX: KYPHOPLASTY: SHX5884

## 2015-06-02 LAB — HEMOGLOBIN A1C
Hgb A1c MFr Bld: 6.5 % — ABNORMAL HIGH (ref 4.8–5.6)
MEAN PLASMA GLUCOSE: 140 mg/dL

## 2015-06-02 LAB — GLUCOSE, CAPILLARY
GLUCOSE-CAPILLARY: 127 mg/dL — AB (ref 65–99)
Glucose-Capillary: 107 mg/dL — ABNORMAL HIGH (ref 65–99)

## 2015-06-02 SURGERY — KYPHOPLASTY
Anesthesia: General | Site: Spine Thoracic

## 2015-06-02 MED ORDER — LACTATED RINGERS IV SOLN
Freq: Once | INTRAVENOUS | Status: AC
  Administered 2015-06-02: 08:00:00 via INTRAVENOUS

## 2015-06-02 MED ORDER — ONDANSETRON HCL 4 MG/2ML IJ SOLN
INTRAMUSCULAR | Status: DC | PRN
  Administered 2015-06-02: 4 mg via INTRAVENOUS

## 2015-06-02 MED ORDER — LIDOCAINE HCL (CARDIAC) 20 MG/ML IV SOLN
INTRAVENOUS | Status: DC | PRN
  Administered 2015-06-02: 100 mg via INTRAVENOUS

## 2015-06-02 MED ORDER — FENTANYL CITRATE (PF) 250 MCG/5ML IJ SOLN
INTRAMUSCULAR | Status: AC
  Filled 2015-06-02: qty 5

## 2015-06-02 MED ORDER — ARTIFICIAL TEARS OP OINT
TOPICAL_OINTMENT | OPHTHALMIC | Status: AC
  Filled 2015-06-02: qty 3.5

## 2015-06-02 MED ORDER — IOHEXOL 300 MG/ML  SOLN
INTRAMUSCULAR | Status: DC | PRN
  Administered 2015-06-02: 100 mL

## 2015-06-02 MED ORDER — LIDOCAINE HCL (CARDIAC) 20 MG/ML IV SOLN
INTRAVENOUS | Status: AC
  Filled 2015-06-02: qty 5

## 2015-06-02 MED ORDER — BACITRACIN ZINC 500 UNIT/GM EX OINT
TOPICAL_OINTMENT | CUTANEOUS | Status: AC
  Filled 2015-06-02: qty 28.35

## 2015-06-02 MED ORDER — PROPOFOL 10 MG/ML IV BOLUS
INTRAVENOUS | Status: DC | PRN
  Administered 2015-06-02: 120 mg via INTRAVENOUS

## 2015-06-02 MED ORDER — MIDAZOLAM HCL 5 MG/5ML IJ SOLN
INTRAMUSCULAR | Status: DC | PRN
  Administered 2015-06-02: 2 mg via INTRAVENOUS

## 2015-06-02 MED ORDER — ROCURONIUM BROMIDE 50 MG/5ML IV SOLN
INTRAVENOUS | Status: AC
  Filled 2015-06-02: qty 1

## 2015-06-02 MED ORDER — FENTANYL CITRATE (PF) 100 MCG/2ML IJ SOLN
50.0000 ug | Freq: Once | INTRAMUSCULAR | Status: AC
  Administered 2015-06-02: 50 ug via INTRAVENOUS

## 2015-06-02 MED ORDER — ONDANSETRON HCL 4 MG/2ML IJ SOLN
INTRAMUSCULAR | Status: AC
  Filled 2015-06-02: qty 2

## 2015-06-02 MED ORDER — ONDANSETRON HCL 4 MG/2ML IJ SOLN: 4.0000 mg | Freq: Once | INTRAMUSCULAR | Status: DC | PRN

## 2015-06-02 MED ORDER — OXYCODONE-ACETAMINOPHEN 5-325 MG PO TABS
1.0000 | ORAL_TABLET | Freq: Once | ORAL | Status: AC
  Administered 2015-06-02: 1 via ORAL

## 2015-06-02 MED ORDER — EPHEDRINE SULFATE 50 MG/ML IJ SOLN
INTRAMUSCULAR | Status: DC | PRN
  Administered 2015-06-02: 10 mg via INTRAVENOUS

## 2015-06-02 MED ORDER — HYDROMORPHONE HCL 1 MG/ML IJ SOLN
0.2500 mg | INTRAMUSCULAR | Status: DC | PRN
  Administered 2015-06-02 (×2): 0.5 mg via INTRAVENOUS

## 2015-06-02 MED ORDER — SODIUM CHLORIDE 0.9 % IJ SOLN
INTRAMUSCULAR | Status: AC
  Filled 2015-06-02: qty 10

## 2015-06-02 MED ORDER — MEPERIDINE HCL 25 MG/ML IJ SOLN: 6.2500 mg | INTRAMUSCULAR | Status: DC | PRN

## 2015-06-02 MED ORDER — POVIDONE-IODINE 7.5 % EX SOLN: Freq: Once | CUTANEOUS | Status: DC

## 2015-06-02 MED ORDER — FENTANYL CITRATE (PF) 100 MCG/2ML IJ SOLN
INTRAMUSCULAR | Status: DC | PRN
  Administered 2015-06-02 (×3): 50 ug via INTRAVENOUS
  Administered 2015-06-02: 100 ug via INTRAVENOUS

## 2015-06-02 MED ORDER — HYDROCODONE-ACETAMINOPHEN 5-325 MG PO TABS
ORAL_TABLET | ORAL | Status: AC
  Filled 2015-06-02: qty 1

## 2015-06-02 MED ORDER — ARTIFICIAL TEARS OP OINT
TOPICAL_OINTMENT | OPHTHALMIC | Status: DC | PRN
  Administered 2015-06-02: 1 via OPHTHALMIC

## 2015-06-02 MED ORDER — MIDAZOLAM HCL 2 MG/2ML IJ SOLN
INTRAMUSCULAR | Status: AC
  Filled 2015-06-02: qty 4

## 2015-06-02 MED ORDER — EPHEDRINE SULFATE 50 MG/ML IJ SOLN
INTRAMUSCULAR | Status: AC
  Filled 2015-06-02: qty 1

## 2015-06-02 MED ORDER — HYDROCODONE-ACETAMINOPHEN 5-325 MG PO TABS
1.0000 | ORAL_TABLET | Freq: Once | ORAL | Status: DC | PRN
  Administered 2015-06-02: 1 via ORAL

## 2015-06-02 MED ORDER — OXYCODONE-ACETAMINOPHEN 5-325 MG PO TABS
ORAL_TABLET | ORAL | Status: AC
  Filled 2015-06-02: qty 1

## 2015-06-02 MED ORDER — HYDROMORPHONE HCL 1 MG/ML IJ SOLN
INTRAMUSCULAR | Status: AC
  Filled 2015-06-02: qty 1

## 2015-06-02 MED ORDER — CEFAZOLIN SODIUM-DEXTROSE 2-3 GM-% IV SOLR
2.0000 g | INTRAVENOUS | Status: AC
  Administered 2015-06-02: 2 g via INTRAVENOUS
  Filled 2015-06-02: qty 50

## 2015-06-02 MED ORDER — FENTANYL CITRATE (PF) 100 MCG/2ML IJ SOLN
INTRAMUSCULAR | Status: AC
  Administered 2015-06-02: 50 ug via INTRAVENOUS
  Filled 2015-06-02: qty 2

## 2015-06-02 MED ORDER — LACTATED RINGERS IV SOLN
INTRAVENOUS | Status: DC | PRN
  Administered 2015-06-02 (×2): via INTRAVENOUS

## 2015-06-02 MED ORDER — ROCURONIUM BROMIDE 100 MG/10ML IV SOLN
INTRAVENOUS | Status: DC | PRN
  Administered 2015-06-02: 50 mg via INTRAVENOUS

## 2015-06-02 MED ORDER — BUPIVACAINE-EPINEPHRINE (PF) 0.25% -1:200000 IJ SOLN
INTRAMUSCULAR | Status: AC
  Filled 2015-06-02: qty 30

## 2015-06-02 SURGICAL SUPPLY — 50 items
ADH SKN CLS APL DERMABOND .7 (GAUZE/BANDAGES/DRESSINGS)
BANDAGE ADH SHEER 1  50/CT (GAUZE/BANDAGES/DRESSINGS) ×4 IMPLANT
BLADE SURG 15 STRL LF DISP TIS (BLADE) ×1 IMPLANT
BLADE SURG 15 STRL SS (BLADE) ×2
CEMENT BONE KYPHX HV R (Orthopedic Implant) ×2 IMPLANT
CEMENT KYPHON C01A KIT/MIXER (Cement) ×1 IMPLANT
COVER MAYO STAND STRL (DRAPES) ×2 IMPLANT
COVER SURGICAL LIGHT HANDLE (MISCELLANEOUS) ×2 IMPLANT
CURETTE WEDGE 8.5MM KYPHX (MISCELLANEOUS) ×1 IMPLANT
DERMABOND ADVANCED (GAUZE/BANDAGES/DRESSINGS)
DERMABOND ADVANCED .7 DNX12 (GAUZE/BANDAGES/DRESSINGS) IMPLANT
DRAPE C-ARM 42X72 X-RAY (DRAPES) ×2 IMPLANT
DRAPE INCISE IOBAN 66X45 STRL (DRAPES) ×2 IMPLANT
DRAPE LAPAROTOMY T 102X78X121 (DRAPES) ×2 IMPLANT
DRAPE SURG 17X23 STRL (DRAPES) ×8 IMPLANT
DURAPREP 26ML APPLICATOR (WOUND CARE) ×2 IMPLANT
GAUZE SPONGE 2X2 8PLY NS (GAUZE/BANDAGES/DRESSINGS) ×1 IMPLANT
GAUZE SPONGE 4X4 16PLY XRAY LF (GAUZE/BANDAGES/DRESSINGS) ×2 IMPLANT
GLOVE BIO SURGEON STRL SZ7 (GLOVE) ×2 IMPLANT
GLOVE BIO SURGEON STRL SZ8 (GLOVE) ×2 IMPLANT
GLOVE BIOGEL PI IND STRL 6.5 (GLOVE) IMPLANT
GLOVE BIOGEL PI IND STRL 7.0 (GLOVE) ×1 IMPLANT
GLOVE BIOGEL PI IND STRL 8 (GLOVE) ×1 IMPLANT
GLOVE BIOGEL PI INDICATOR 6.5 (GLOVE) ×1
GLOVE BIOGEL PI INDICATOR 7.0 (GLOVE) ×1
GLOVE BIOGEL PI INDICATOR 8 (GLOVE) ×1
GLOVE ECLIPSE 6.5 STRL STRAW (GLOVE) ×1 IMPLANT
GOWN STRL REUS W/ TWL LRG LVL3 (GOWN DISPOSABLE) ×2 IMPLANT
GOWN STRL REUS W/ TWL XL LVL3 (GOWN DISPOSABLE) ×1 IMPLANT
GOWN STRL REUS W/TWL LRG LVL3 (GOWN DISPOSABLE) ×4
GOWN STRL REUS W/TWL XL LVL3 (GOWN DISPOSABLE) ×2
KIT BASIN OR (CUSTOM PROCEDURE TRAY) ×2 IMPLANT
KIT ROOM TURNOVER OR (KITS) ×2 IMPLANT
NDL HYPO 25X1 1.5 SAFETY (NEEDLE) IMPLANT
NDL SPNL 18GX3.5 QUINCKE PK (NEEDLE) ×2 IMPLANT
NEEDLE 22X1 1/2 (OR ONLY) (NEEDLE) IMPLANT
NEEDLE HYPO 25X1 1.5 SAFETY (NEEDLE) IMPLANT
NEEDLE SPNL 18GX3.5 QUINCKE PK (NEEDLE) ×4 IMPLANT
NS IRRIG 1000ML POUR BTL (IV SOLUTION) ×2 IMPLANT
PACK SURGICAL SETUP 50X90 (CUSTOM PROCEDURE TRAY) ×2 IMPLANT
PAD ARMBOARD 7.5X6 YLW CONV (MISCELLANEOUS) ×4 IMPLANT
POSITIONER HEAD PRONE TRACH (MISCELLANEOUS) ×2 IMPLANT
SUT MNCRL AB 4-0 PS2 18 (SUTURE) ×2 IMPLANT
SYR BULB IRRIGATION 50ML (SYRINGE) ×2 IMPLANT
SYR CONTROL 10ML LL (SYRINGE) ×2 IMPLANT
TAPE CLOTH SOFT 2X10 (GAUZE/BANDAGES/DRESSINGS) ×1 IMPLANT
TOWEL OR 17X24 6PK STRL BLUE (TOWEL DISPOSABLE) ×2 IMPLANT
TOWEL OR 17X26 10 PK STRL BLUE (TOWEL DISPOSABLE) ×2 IMPLANT
TRAY KYPHOPAK 15/2 EXPRESS (KITS) ×2 IMPLANT
TRAY KYPHOPAK 15/3 ONESTEP 1ST (MISCELLANEOUS) IMPLANT

## 2015-06-02 NOTE — Op Note (Signed)
Jenny Cardenas, Jenny Cardenas                 ACCOUNT NO.:  [id number]  MEDICAL RECORD NO.:  [id number]  LOCATION:  [facility]                         FACILITY:  [facility]  PHYSICIAN:  Phylliss Bob, MD      DATE OF BIRTH:  [birth year]  DATE OF PROCEDURE:  06/02/2015 DATE OF DISCHARGE:  06/02/2015                              OPERATIVE REPORT   PREOPERATIVE DIAGNOSIS:  Compression fractures; T3, T8, T10.  POSTOPERATIVE DIAGNOSIS:  Compression fractures; T3, T8, T10.  PROCEDURE:  Kyphoplasty; T3, T8, T10.  SURGEON:  Phylliss Bob, MD  ASSISTANTPricilla Holm, PA-C.  ANESTHESIA:  General endotracheal anesthesia.  COMPLICATIONS:  None.  DISPOSITION:  Stable.  ESTIMATED BLOOD LOSS:  Minimal.  INDICATIONS FOR SURGERY:  Briefly, Jenny Cardenas is a very pleasant 64 year old female, who I recently saw with ongoing pain in the upper to mid to low back.  Of note, the patient did begin having pain in August, when she did sustain a fall and fell onto her buttocks.  An MRI did reveal edema in the T3, T8, and T10 vertebral bodies.  We did initially discuss conservative care such as bracing.  However, she continued to have ongoing pain, as the patient stated that her insurance company did not cover the cost of a brace.  She then presented to me with ongoing and significant pain, and we did discuss proceeding with a kyphoplasty procedure.  The patient did fully understand the risks and benefits of surgery and did elect to proceed.  OPERATIVE DETAILS:  On June 02, 2015, the patient was brought to surgery and general endotracheal anesthesia was administered.  The patient was placed prone on a well-padded flat Jackson bed.  Gel rolls were placed under the patient's chest and hips.  The patient's arms were abducted, and all bony prominences were padded.  The back was prepped and draped.  I then brought in by plantar fluoroscopy.  Again, AP and lateral fluoroscopic images were obtained.  The bed was  appropriately rotated, as were the images.  I was able to identify the T8 and T10 vertebral bodies.  Of note, I did count down from the C2 vertebral body in order to ensure appropriate numbering.  I was able to visualize T8 and T10 very well on both AP and lateral fluoroscopic images.  I was able to visualize T3 on the AP image, however, given the patient's shoulders and body habitus, it was extremely difficult to entirely visualize the T3 vertebral body.  I was, however, able to identify the landmarks needed in order to proceed with the procedure, however, the visualization was suboptimal and I did make a decision to proceed, but to proceeded very cautiously.   I started at the T3 level.   I then advanced Jamshidi needles across the T3 pedicles bilaterally.  Of note, the T3 bone was noted to be very small, and the pedicles were also noted to be extremely small, particularly on the patient's right side.  I was, however, ultimately able to cannulate the pedicles uneventfully.  I then inserted a kyphoplasty balloons and inflated the balloons with approximately 2 mL of contrast agent.  The cement was mixed on  the back table.  I then introduced cement through the Jamshidies on both the right and on the left sides.  I did note excellent interdigitation of the cement on the left side.  On the right side, the cement did appear to partially fill the bone on the AP view.  I then noted on the lateral view, minimal anterior extravasation of cement.  As soon as this was identified and noted, I immediately discontinued additional introduction of cement into the right Jamshidi.  There was no additional extravasation of cement.  I was, however, able to obtain interdigitation of the cement into the T3 vertebral body, mostly via the left cannula.  There was no abnormal extravasation of cement posteriorly into the spinal canal.  The cement was then allowed to harden.  I then cannulated the bilateral T8 and  T10 pedicles in the manner previously described.  Once again, kyphoplasty balloons were inserted at each level.  I then introduced cement through the cannulas bilaterally at T8 and T10.  At T3, a total of approximately 2 mL of cement was introduced into the vertebral body.  At T8 and T10, there was approximately 5 mL of cement that was introduced in each vertebral body. At T8 and T10, there was no abnormal extravasation of cement posteriorly, laterally, or anteriorly.  The cement was then allowed to harden.  The wound was then irrigated and the Jamshidi needles were then removed.  The portal sites were then closed with 3-0 Monocryl. Bacitracin and a sterile dressing was applied.  The patient was then awakened from general endotracheal anesthesia and transferred to recovery in stable condition. The patient was then evaluated by me in the  recovery room and was noted to have stable vital signs and did appear comfortable,  and was ultimately able to be comfortably discharged home.       Phylliss Bob, MD     MD/MEDQ  D:  06/02/2015  T:  06/02/2015  Job:  [id number]

## 2015-06-02 NOTE — Transfer of Care (Signed)
Immediate Anesthesia Transfer of Care Note  Patient: Jenny Cardenas  Procedure(s) Performed: Procedure(s) with comments: KYPHOPLASTY (N/A) - Thoracic 3, 8, 10 kyphoplasty  Patient Location: PACU  Anesthesia Type:General  Level of Consciousness: awake, alert , oriented and sedated  Airway & Oxygen Therapy: Patient Spontanous Breathing and Patient connected to nasal cannula oxygen  Post-op Assessment: Report given to RN, Post -op Vital signs reviewed and stable and Patient moving all extremities  Post vital signs: Reviewed and stable  Last Vitals:  Filed Vitals:   06/02/15 1402  BP: 126/63  Pulse: 90  Temp: 36.3 C  Resp: 19    Complications: No apparent anesthesia complications

## 2015-06-02 NOTE — Anesthesia Procedure Notes (Signed)
Procedure Name: Intubation Date/Time: 06/02/2015 11:00 AM Performed by: Scheryl Darter Pre-anesthesia Checklist: Patient identified, Emergency Drugs available, Suction available, Patient being monitored and Timeout performed Patient Re-evaluated:Patient Re-evaluated prior to inductionOxygen Delivery Method: Circle system utilized Preoxygenation: Pre-oxygenation with 100% oxygen Intubation Type: IV induction Ventilation: Mask ventilation without difficulty Laryngoscope Size: Miller and 2 Grade View: Grade I Tube type: Oral Tube size: 7.5 mm Number of attempts: 1 Airway Equipment and Method: Stylet Placement Confirmation: ETT inserted through vocal cords under direct vision,  positive ETCO2 and breath sounds checked- equal and bilateral Secured at: 22 cm Tube secured with: Tape Dental Injury: Teeth and Oropharynx as per pre-operative assessment

## 2015-06-02 NOTE — Anesthesia Preprocedure Evaluation (Signed)
Anesthesia Evaluation  Patient identified by MRN, date of birth, ID band Patient awake    Reviewed: Allergy & Precautions, NPO status , Patient's Chart, lab work & pertinent test results  Airway Mallampati: I  TM Distance: >3 FB Neck ROM: Full    Dental   Pulmonary Current Smoker,    Pulmonary exam normal        Cardiovascular hypertension, Pt. on medications Normal cardiovascular exam     Neuro/Psych Anxiety Depression    GI/Hepatic GERD  Medicated and Controlled,  Endo/Other  diabetes, Type 2  Renal/GU      Musculoskeletal   Abdominal   Peds  Hematology   Anesthesia Other Findings   Reproductive/Obstetrics                             Anesthesia Physical Anesthesia Plan  ASA: II  Anesthesia Plan: General   Post-op Pain Management:    Induction: Intravenous  Airway Management Planned: Oral ETT  Additional Equipment:   Intra-op Plan:   Post-operative Plan: Extubation in OR  Informed Consent: I have reviewed the patients History and Physical, chart, labs and discussed the procedure including the risks, benefits and alternatives for the proposed anesthesia with the patient or authorized representative who has indicated his/her understanding and acceptance.     Plan Discussed with: CRNA and Surgeon  Anesthesia Plan Comments:         Anesthesia Quick Evaluation

## 2015-06-02 NOTE — Anesthesia Postprocedure Evaluation (Signed)
Anesthesia Post Note  Patient: Jenny Cardenas  Procedure(s) Performed: Procedure(s) (LRB): KYPHOPLASTY (N/A)  Anesthesia type: general  Patient location: PACU  Post pain: Pain level controlled  Post assessment: Patient's Cardiovascular Status Stable  Last Vitals:  Filed Vitals:   06/02/15 1600  BP: 120/72  Pulse: 90  Temp:   Resp: 20    Post vital signs: Reviewed and stable  Level of consciousness: sedated  Complications: No apparent anesthesia complications

## 2015-06-03 ENCOUNTER — Encounter (HOSPITAL_COMMUNITY): Payer: Self-pay | Admitting: Orthopedic Surgery

## 2015-06-03 LAB — GLUCOSE, CAPILLARY: GLUCOSE-CAPILLARY: 141 mg/dL — AB (ref 65–99)

## 2015-08-26 ENCOUNTER — Other Ambulatory Visit: Payer: Self-pay

## 2015-08-26 DIAGNOSIS — Z1231 Encounter for screening mammogram for malignant neoplasm of breast: Secondary | ICD-10-CM

## 2015-10-04 ENCOUNTER — Ambulatory Visit
Admission: RE | Admit: 2015-10-04 | Discharge: 2015-10-04 | Disposition: A | Discharge status: Home / Self Care | Payer: PPO | Source: Ambulatory Visit

## 2015-10-04 DIAGNOSIS — Z1231 Encounter for screening mammogram for malignant neoplasm of breast: Secondary | ICD-10-CM

## 2016-02-21 DIAGNOSIS — H2513 Age-related nuclear cataract, bilateral: Secondary | ICD-10-CM | POA: Diagnosis not present

## 2016-02-21 DIAGNOSIS — H2511 Age-related nuclear cataract, right eye: Secondary | ICD-10-CM | POA: Diagnosis not present

## 2016-02-21 DIAGNOSIS — Z961 Presence of intraocular lens: Secondary | ICD-10-CM | POA: Diagnosis not present

## 2016-02-22 DIAGNOSIS — H2512 Age-related nuclear cataract, left eye: Secondary | ICD-10-CM | POA: Diagnosis not present

## 2016-02-22 DIAGNOSIS — H25012 Cortical age-related cataract, left eye: Secondary | ICD-10-CM | POA: Diagnosis not present

## 2016-02-22 DIAGNOSIS — H25042 Posterior subcapsular polar age-related cataract, left eye: Secondary | ICD-10-CM | POA: Diagnosis not present

## 2016-02-28 DIAGNOSIS — H2512 Age-related nuclear cataract, left eye: Secondary | ICD-10-CM | POA: Diagnosis not present

## 2016-02-28 DIAGNOSIS — Z961 Presence of intraocular lens: Secondary | ICD-10-CM | POA: Diagnosis not present

## 2016-02-28 DIAGNOSIS — H2513 Age-related nuclear cataract, bilateral: Secondary | ICD-10-CM | POA: Diagnosis not present

## 2016-03-19 DIAGNOSIS — H578 Other specified disorders of eye and adnexa: Secondary | ICD-10-CM | POA: Diagnosis not present

## 2016-03-21 DIAGNOSIS — H578 Other specified disorders of eye and adnexa: Secondary | ICD-10-CM | POA: Diagnosis not present

## 2016-03-23 DIAGNOSIS — H578 Other specified disorders of eye and adnexa: Secondary | ICD-10-CM | POA: Diagnosis not present

## 2016-03-26 DIAGNOSIS — H04123 Dry eye syndrome of bilateral lacrimal glands: Secondary | ICD-10-CM | POA: Diagnosis not present

## 2016-05-01 DIAGNOSIS — Z23 Encounter for immunization: Secondary | ICD-10-CM | POA: Diagnosis not present

## 2016-06-19 DIAGNOSIS — Y92481 Parking lot as the place of occurrence of the external cause: Secondary | ICD-10-CM | POA: Diagnosis not present

## 2016-06-19 DIAGNOSIS — W101XXA Fall (on)(from) sidewalk curb, initial encounter: Secondary | ICD-10-CM | POA: Diagnosis not present

## 2016-06-19 DIAGNOSIS — M25572 Pain in left ankle and joints of left foot: Secondary | ICD-10-CM | POA: Diagnosis not present

## 2016-06-19 DIAGNOSIS — S96912A Strain of unspecified muscle and tendon at ankle and foot level, left foot, initial encounter: Secondary | ICD-10-CM | POA: Diagnosis not present

## 2016-06-29 DIAGNOSIS — E538 Deficiency of other specified B group vitamins: Secondary | ICD-10-CM | POA: Diagnosis not present

## 2016-06-29 DIAGNOSIS — Z7984 Long term (current) use of oral hypoglycemic drugs: Secondary | ICD-10-CM | POA: Diagnosis not present

## 2016-06-29 DIAGNOSIS — E785 Hyperlipidemia, unspecified: Secondary | ICD-10-CM | POA: Diagnosis not present

## 2016-06-29 DIAGNOSIS — E0590 Thyrotoxicosis, unspecified without thyrotoxic crisis or storm: Secondary | ICD-10-CM | POA: Diagnosis not present

## 2016-06-29 DIAGNOSIS — I10 Essential (primary) hypertension: Secondary | ICD-10-CM | POA: Diagnosis not present

## 2016-06-29 DIAGNOSIS — E119 Type 2 diabetes mellitus without complications: Secondary | ICD-10-CM | POA: Diagnosis not present

## 2016-06-29 DIAGNOSIS — F988 Other specified behavioral and emotional disorders with onset usually occurring in childhood and adolescence: Secondary | ICD-10-CM | POA: Diagnosis not present

## 2016-07-28 DIAGNOSIS — G44219 Episodic tension-type headache, not intractable: Secondary | ICD-10-CM | POA: Diagnosis not present

## 2016-09-07 DIAGNOSIS — E0590 Thyrotoxicosis, unspecified without thyrotoxic crisis or storm: Secondary | ICD-10-CM | POA: Diagnosis not present

## 2016-09-13 DIAGNOSIS — R296 Repeated falls: Secondary | ICD-10-CM | POA: Diagnosis not present

## 2016-09-13 DIAGNOSIS — R2689 Other abnormalities of gait and mobility: Secondary | ICD-10-CM | POA: Diagnosis not present

## 2016-09-15 DIAGNOSIS — M545 Low back pain: Secondary | ICD-10-CM | POA: Diagnosis not present

## 2016-09-15 DIAGNOSIS — M546 Pain in thoracic spine: Secondary | ICD-10-CM | POA: Diagnosis not present

## 2016-09-15 DIAGNOSIS — M542 Cervicalgia: Secondary | ICD-10-CM | POA: Diagnosis not present

## 2016-09-17 ENCOUNTER — Other Ambulatory Visit: Payer: Self-pay | Admitting: Orthopedic Surgery

## 2016-09-17 DIAGNOSIS — M546 Pain in thoracic spine: Secondary | ICD-10-CM

## 2016-09-17 DIAGNOSIS — M545 Low back pain: Secondary | ICD-10-CM

## 2016-09-17 DIAGNOSIS — M542 Cervicalgia: Secondary | ICD-10-CM

## 2016-09-20 ENCOUNTER — Ambulatory Visit: Payer: Medicare Other | Attending: Family Medicine | Admitting: Physical Therapy

## 2016-09-20 ENCOUNTER — Encounter: Payer: Self-pay | Admitting: Physical Therapy

## 2016-09-20 DIAGNOSIS — M545 Low back pain, unspecified: Secondary | ICD-10-CM

## 2016-09-20 DIAGNOSIS — M6281 Muscle weakness (generalized): Secondary | ICD-10-CM | POA: Diagnosis not present

## 2016-09-20 DIAGNOSIS — R2689 Other abnormalities of gait and mobility: Secondary | ICD-10-CM | POA: Insufficient documentation

## 2016-09-20 NOTE — Patient Instructions (Addendum)
Can order groceries from [employer], [employer], [employer], [employer]  ABDUCTION: Sitting - Resistance Band (Active)    Sit with feet flat. Lift right leg slightly and, against yellow resistance band, draw it out to side. Complete ___ sets of ___ repetitions. Perform ___ sessions per day.  Copyright  VHI. All rights reserved.  High Stepping in Place (Sitting)    Sitting, alternately lift knees as high as possible. Keep torso erect. Repeat ____ times, each leg.  Copyright  VHI. All rights reserved.  Heel Raises    Stand with support. Tighten pelvic floor and hold. With knees straight, raise heels off ground. Hold ___ seconds. Relax for ___ seconds. Repeat ___ times. Do ___ times a day.  Copyright  VHI. All rights reserved.  KNEE: Extension, Long Arc Quads - Sitting    Raise leg until knee is straight. ___ reps per set, ___ sets per day, ___ days per week  Copyright  VHI. All rights reserved.  ADDUCTION: Isometric    With ball between knees, squeeze them inward. Hold ___ seconds. Complete ___ sets of ___ repetitions. Perform ___ sessions per day.  http://gtsc.exer.us/125   Copyright  VHI. All rights reserved.   Mikle Bosworth, PTA 09/20/16 10:52 AM  [facility] [address] [place], [place] [address] Phone # [phone] Fax [phone]

## 2016-09-20 NOTE — Therapy (Addendum)
[facility] [address], [place], [place], [address] Phone: [phone]   Fax:  [phone]  Physical Therapy Evaluation  Patient Details  Name: Jenny Cardenas MRN: [id number] Date of Birth: [birth year] Referring Provider: Dr. Lujean Amel  Encounter Date: 09/20/2016      PT End of Session - 09/20/16 1050    Visit Number 1   Number of Visits 10   Date for PT Re-Evaluation 11/15/16   Authorization Type medicare g-code 10th visit   PT Start Time 1015   PT Stop Time 1100   PT Time Calculation (min) 45 min   Activity Tolerance Patient tolerated treatment well   Behavior During Therapy [facility] for tasks assessed/performed      Past Medical History:  Diagnosis Date  . Anxiety   . Arthritis   . Carpal tunnel syndrome   . Cataract   . Depression   . Diabetes mellitus without complication (HCC)    Type 2  . Fall April 10, 2015  . GERD (gastroesophageal reflux disease)   . Goiter   . History of shingles    On abdomen, left ring finger, and left leg; Pt takes Valtrex  . Hyperlipidemia   . Hypertension   . Obesity   . PTSD (post-traumatic stress disorder)   . Shortness of breath dyspnea    pt stated its related to the back problems she has  . Thyroid disease   . Ulcer [facility])     Past Surgical History:  Procedure Laterality Date  . ABDOMINAL HYSTERECTOMY  1996  . [place]  . COLONOSCOPY W/ POLYPECTOMY    . KYPHOPLASTY N/A 06/02/2015   Procedure: KYPHOPLASTY;  Surgeon: Phylliss Bob, MD;  Location: [facility];  Service: Orthopedics;  Laterality: N/A;  Thoracic 3, 8, 10 kyphoplasty    There were no vitals filed for this visit.       Subjective Assessment - 09/20/16 1026    Subjective I fall alot.  My feet do not want to go the direction they should go.  Back pain gets worse after falls.    Patient Stated Goals increase strength; reduce falls   Currently in Pain? Yes   Pain Score 4     Pain Location Back   Pain Orientation Lower;Mid   Pain Descriptors / Indicators Aching   Pain Type Acute pain   Pain Onset More than a month ago   Pain Frequency Constant   Aggravating Factors  falls; lifting from ground up; bringing in groceries;    Pain Relieving Factors rest   Multiple Pain Sites No            [facility] PT Assessment - 09/20/16 0001      Assessment   Medical Diagnosis R26.89 Imbalance   Referring Provider Dr. Lauretta Grill [place]   Onset Date/Surgical Date 02/11/16   Prior Therapy none     Precautions   Precautions Other (comment)   Precaution Comments osteoporosis; frequent falls     Restrictions   Weight Bearing Restrictions No     Balance Screen   Has the patient fallen in the past 6 months Yes   How many times? falls 5x last 2 months with injuries   Has the patient had a decrease in activity level because of a fear of falling?  Yes   Is the patient reluctant to leave their home because of a fear of falling?  Yes     Home Environment  Living Environment Private residence   Living Arrangements Parent   Available Help at Discharge Family   Type of [place] to enter   Entrance Stairs-Number of Steps 6   Entrance Stairs-Rails Can reach both   [place] One level     Cognition   Overall Cognitive Status Within Functional Limits for tasks assessed     Observation/Other Assessments   Focus on Therapeutic Outcomes (FOTO)  therapist discretion is 100% limitation due to Berg balance score     Posture/Postural Control   Posture/Postural Control No significant limitations     ROM / Strength   AROM / PROM / Strength Strength     Strength   Overall Strength Comments bilateral ankle strength is 4/5; bilateral hip abduction and extension 4/5   Right Knee Extension 4/5   Left Knee Extension 4/5     Transfers   Transfers Not assessed     Ambulation/Gait   Ambulation/Gait Yes   Assistive device Straight cane   Gait Pattern  Decreased step length - right;Decreased step length - left;Decreased stride length;Decreased dorsiflexion - left;Decreased dorsiflexion - right;Narrow base of support   Stairs Yes   Stairs Assistance 6: Modified independent (Device/Increase time)   Stair Management Technique Two rails     Berg Balance Test   Sit to Stand Able to stand  independently using hands   Standing Unsupported Able to stand 2 minutes with supervision   Sitting with Back Unsupported but Feet Supported on Floor or Stool Able to sit safely and securely 2 minutes   Stand to Sit Uses backs of legs against chair to control descent   Transfers Able to transfer with verbal cueing and /or supervision   Standing Unsupported with Eyes Closed Able to stand 10 seconds with supervision   Standing Ubsupported with Feet Together Needs help to attain position but able to stand for 30 seconds with feet together   From Standing, Reach Forward with Outstretched Arm Can reach forward >12 cm safely (5")   From Standing Position, Pick up Object from Floor Able to pick up shoe, needs supervision   From Standing Position, Turn to Look Behind Over each Shoulder Turn sideways only but maintains balance   Turn 360 Degrees Able to turn 360 degrees safely but slowly   Standing Unsupported, Alternately Place Feet on Step/Stool Needs assistance to keep from falling or unable to try   Standing Unsupported, One Foot in [employer] balance while stepping or standing   Standing on One Leg Unable to try or needs assist to prevent fall   Total Score 28   Berg comment: 100% chance of falling                           PT Education - 09/20/16 1054    Education provided Yes   Education Details Seated LE strengthening   Person(s) Educated Patient   Methods Explanation;Demonstration;Handout   Comprehension Verbalized understanding          PT Short Term Goals - 09/20/16 1208      PT SHORT TERM GOAL #1   Title independent with  initial HEP   Time 4   Period Weeks   Status New     PT SHORT TERM GOAL #2   Title Berg balance score >/= 34/56   Time 4   Period Weeks   Status New     PT SHORT TERM GOAL #3  Title has not fallen in 2 weeks due to increased balance and strength   Time 4   Period Weeks   Status New     PT SHORT TERM GOAL #4   Title sit to stand with hands and not having her legs agains the chair for stability due to increased strength and balance   Time 4   Period Weeks   Status New     PT SHORT TERM GOAL #5   Title understand tips to prevent falls    Time 4   Period Weeks   Status New           PT Long Term Goals - 09/20/16 1211      PT LONG TERM GOAL #1   Title independent with HEP   Time 8   Period Weeks   Status New     PT LONG TERM GOAL #2   Title Berg Balance score </= 45/56 indicating less chance of fall   Time 8   Period Weeks   Status New     PT LONG TERM GOAL #3   Title ambulate with single point cane with patient feeling >/= 50% increased steadiness and no falls in 5 weeks   Time 8   Period Weeks   Status New     PT LONG TERM GOAL #4   Title understand ways to pick things up from the ground using a reacher and good body mechanics to decrease chance of fall   Time 8   Period Weeks   Status New     PT LONG TERM GOAL #5   Title able to stand on one foot for 3-5 seconds so she is able to go up and down steps with single point cane with >/= 50% greater ease   Time 8   Period Weeks   Status New               Plan - 09/20/16 1051    Clinical Impression Statement Patient is a 66 year old female with diagnosis of imbalance and has frequent falls. She is having further diagnostic testing to assess her further. Patient reports lumbar pain at level 4/10 that is progressively worse due to her frequent falls.  Patient ambulates with a single point cane, small steps, slowly, and unsteady.  Patient has difficult lifting items due to falling. Patient has to stand  with her legs resting onto a chair.  Patient has to hold the therapist hands to bring her feet together in standing.  Patient sits better wtih a back rest. Berg Balance score is 23/56 indicating 100% chance of falls.  Lower extremity strength is 4/5. Patient reports one fall has caused fractures in her spine. Patient is moderately compled evaluation due to an evolving condition and comorbidities such as diabetes, hypothroidism; PTSD and no initial diagnosis for falls that will impact care provided. Patient will benefit from skilled therapy to improve strength and reduce falls.    Rehab Potential Good   Clinical Impairments Affecting Rehab Potential osteoporosis; frequent falls   PT Frequency 2x / week   PT Duration 8 weeks   PT Treatment/Interventions Cryotherapy;Electrical Stimulation;Moist Heat;Ultrasound;Gait training;Stair training;Patient/family education;Neuromuscular re-education;Balance training;Therapeutic exercise;Therapeutic activities;Manual techniques;Energy conservation   PT Next Visit Plan balance exercises; see results of MRI that was done on Sunday; LE strengthening; modalities for pain;    PT Home Exercise Plan tips to avoid falls   Recommended Other Services None   Consulted and Agree with Plan of Care Patient  Patient will benefit from skilled therapeutic intervention in order to improve the following deficits and impairments:  Abnormal gait, Difficulty walking, Decreased endurance, Pain, Decreased activity tolerance, Decreased strength  Visit Diagnosis: Other abnormalities of gait and mobility - Plan: PT plan of care cert/re-cert  Muscle weakness (generalized) - Plan: PT plan of care cert/re-cert  Acute midline low back pain without sciatica - Plan: PT plan of care cert/re-cert      G-Codes - 11/55/20 10-14-14    Functional Assessment Tool Used therapist discretion is 100% limitation due to Berg balance score 28/56   Functional Limitation Mobility: Walking and moving  around   Mobility: Walking and Moving Around Current Status [phone]) 100 percent impaired, limited or restricted   Mobility: Walking and Moving Around Goal Status [phone]) At least 40 percent but less than 60 percent impaired, limited or restricted       Problem List Patient Active Problem List   Diagnosis Date Noted  . Hyperthyroidism 09/28/2013  . Multinodular goiter 09/28/2013  . Rash and nonspecific skin eruption 01/31/2013  . Rash and other nonspecific skin eruption 10/16/2010  . DIAB W/OTH MANIFESTS TYPE II/UNS TYPE UNCNTRL 04/10/2010  . OSTEOPENIA 08/27/2008  . UNS ADVRS EFF UNS RX MEDICINAL&BIOLOGICAL SBSTNC 09/18/2007  . CARPAL TUNNEL SYNDROME 03/04/2007  . ACTINIC KERATOSIS 03/04/2007  . DEPRESSION 02/13/2007  . HYPERLIPIDEMIA 12/25/2006  . OBESITY 12/25/2006  . HYPERTENSION 12/25/2006    Earlie Counts, PT 09/20/16 12:19 PM   [facility] [address] [place], [place], [address] Phone: [phone]   Fax:  [phone]  Name: Jenny Cardenas MRN: [id number] Date of Birth: 03-12-1951 PHYSICAL THERAPY DISCHARGE SUMMARY  Visits from Start of Care: 1  Current functional level related to goals / functional outcomes: See above. Patient did not return after initial evaluation.   Remaining deficits: See above.    Education / Equipment: HEP Plan:                                                    Patient goals were not met. Patient is being discharged due to not returning since the last visit.  Thank you for the referral. Earlie Counts, PT 09/04/17 9:54 AM  ?????

## 2016-09-23 ENCOUNTER — Ambulatory Visit
Admission: RE | Admit: 2016-09-23 | Discharge: 2016-09-23 | Disposition: A | Discharge status: Home / Self Care | Payer: Medicare Other | Source: Ambulatory Visit | Attending: Orthopedic Surgery | Admitting: Orthopedic Surgery

## 2016-09-23 DIAGNOSIS — M542 Cervicalgia: Secondary | ICD-10-CM

## 2016-09-23 DIAGNOSIS — M545 Low back pain: Secondary | ICD-10-CM

## 2016-09-23 DIAGNOSIS — M5124 Other intervertebral disc displacement, thoracic region: Secondary | ICD-10-CM | POA: Diagnosis not present

## 2016-09-23 DIAGNOSIS — M5022 Other cervical disc displacement, mid-cervical region, unspecified level: Secondary | ICD-10-CM | POA: Diagnosis not present

## 2016-09-23 DIAGNOSIS — M546 Pain in thoracic spine: Secondary | ICD-10-CM

## 2016-09-23 DIAGNOSIS — M5126 Other intervertebral disc displacement, lumbar region: Secondary | ICD-10-CM | POA: Diagnosis not present

## 2016-09-25 ENCOUNTER — Encounter: Payer: Medicare Other | Admitting: Physical Therapy

## 2016-09-25 DIAGNOSIS — M546 Pain in thoracic spine: Secondary | ICD-10-CM | POA: Diagnosis not present

## 2016-09-25 DIAGNOSIS — M545 Low back pain: Secondary | ICD-10-CM | POA: Diagnosis not present

## 2016-09-27 ENCOUNTER — Encounter: Payer: Medicare Other | Admitting: Physical Therapy

## 2016-09-27 DIAGNOSIS — Z9181 History of falling: Secondary | ICD-10-CM | POA: Diagnosis not present

## 2016-09-27 DIAGNOSIS — M546 Pain in thoracic spine: Secondary | ICD-10-CM | POA: Diagnosis not present

## 2016-09-27 DIAGNOSIS — R262 Difficulty in walking, not elsewhere classified: Secondary | ICD-10-CM | POA: Diagnosis not present

## 2016-09-27 DIAGNOSIS — M545 Low back pain: Secondary | ICD-10-CM | POA: Diagnosis not present

## 2016-09-28 DIAGNOSIS — M25551 Pain in right hip: Secondary | ICD-10-CM | POA: Diagnosis not present

## 2016-09-28 DIAGNOSIS — M25552 Pain in left hip: Secondary | ICD-10-CM | POA: Diagnosis not present

## 2016-10-02 ENCOUNTER — Encounter: Payer: Medicare Other | Admitting: Physical Therapy

## 2016-10-03 DIAGNOSIS — M546 Pain in thoracic spine: Secondary | ICD-10-CM | POA: Diagnosis not present

## 2016-10-03 DIAGNOSIS — R262 Difficulty in walking, not elsewhere classified: Secondary | ICD-10-CM | POA: Diagnosis not present

## 2016-10-03 DIAGNOSIS — M545 Low back pain: Secondary | ICD-10-CM | POA: Diagnosis not present

## 2016-10-03 DIAGNOSIS — Z9181 History of falling: Secondary | ICD-10-CM | POA: Diagnosis not present

## 2016-10-04 ENCOUNTER — Encounter: Payer: Medicare Other | Admitting: Physical Therapy

## 2016-10-05 DIAGNOSIS — R262 Difficulty in walking, not elsewhere classified: Secondary | ICD-10-CM | POA: Diagnosis not present

## 2016-10-05 DIAGNOSIS — Z9181 History of falling: Secondary | ICD-10-CM | POA: Diagnosis not present

## 2016-10-05 DIAGNOSIS — M546 Pain in thoracic spine: Secondary | ICD-10-CM | POA: Diagnosis not present

## 2016-10-05 DIAGNOSIS — M545 Low back pain: Secondary | ICD-10-CM | POA: Diagnosis not present

## 2016-10-09 ENCOUNTER — Encounter: Payer: Medicare Other | Admitting: Physical Therapy

## 2016-10-09 DIAGNOSIS — R262 Difficulty in walking, not elsewhere classified: Secondary | ICD-10-CM | POA: Diagnosis not present

## 2016-10-09 DIAGNOSIS — Z9181 History of falling: Secondary | ICD-10-CM | POA: Diagnosis not present

## 2016-10-09 DIAGNOSIS — M546 Pain in thoracic spine: Secondary | ICD-10-CM | POA: Diagnosis not present

## 2016-10-09 DIAGNOSIS — M545 Low back pain: Secondary | ICD-10-CM | POA: Diagnosis not present

## 2016-10-09 DIAGNOSIS — H903 Sensorineural hearing loss, bilateral: Secondary | ICD-10-CM | POA: Diagnosis not present

## 2016-10-11 ENCOUNTER — Other Ambulatory Visit: Payer: Self-pay | Admitting: Family Medicine

## 2016-10-11 ENCOUNTER — Encounter: Payer: Medicare Other | Admitting: Physical Therapy

## 2016-10-11 DIAGNOSIS — Z1231 Encounter for screening mammogram for malignant neoplasm of breast: Secondary | ICD-10-CM

## 2016-10-11 DIAGNOSIS — Z9181 History of falling: Secondary | ICD-10-CM | POA: Diagnosis not present

## 2016-10-11 DIAGNOSIS — M545 Low back pain: Secondary | ICD-10-CM | POA: Diagnosis not present

## 2016-10-11 DIAGNOSIS — R262 Difficulty in walking, not elsewhere classified: Secondary | ICD-10-CM | POA: Diagnosis not present

## 2016-10-11 DIAGNOSIS — M546 Pain in thoracic spine: Secondary | ICD-10-CM | POA: Diagnosis not present

## 2016-10-15 DIAGNOSIS — H903 Sensorineural hearing loss, bilateral: Secondary | ICD-10-CM | POA: Diagnosis not present

## 2016-10-16 ENCOUNTER — Encounter: Payer: Medicare Other | Admitting: Physical Therapy

## 2016-10-16 DIAGNOSIS — Z9181 History of falling: Secondary | ICD-10-CM | POA: Diagnosis not present

## 2016-10-16 DIAGNOSIS — M545 Low back pain: Secondary | ICD-10-CM | POA: Diagnosis not present

## 2016-10-16 DIAGNOSIS — R262 Difficulty in walking, not elsewhere classified: Secondary | ICD-10-CM | POA: Diagnosis not present

## 2016-10-16 DIAGNOSIS — M546 Pain in thoracic spine: Secondary | ICD-10-CM | POA: Diagnosis not present

## 2016-10-18 ENCOUNTER — Encounter: Payer: Medicare Other | Admitting: Physical Therapy

## 2016-10-18 DIAGNOSIS — Z9181 History of falling: Secondary | ICD-10-CM | POA: Diagnosis not present

## 2016-10-18 DIAGNOSIS — R262 Difficulty in walking, not elsewhere classified: Secondary | ICD-10-CM | POA: Diagnosis not present

## 2016-10-18 DIAGNOSIS — M546 Pain in thoracic spine: Secondary | ICD-10-CM | POA: Diagnosis not present

## 2016-10-18 DIAGNOSIS — M545 Low back pain: Secondary | ICD-10-CM | POA: Diagnosis not present

## 2016-10-23 ENCOUNTER — Ambulatory Visit: Payer: Medicare Other | Admitting: Physical Therapy

## 2016-10-24 NOTE — Progress Notes (Signed)
Jenny Cardenas was seen today in neurologic consultation at the request of WEBB, CAROL D, MD.  The consultation is for the evaluation of falls.  This patient is accompanied in the office by her mother who supplements the history.  The patient is a 66 y.o. year old female with a history of falls.  Initially states that first fall was 6 months ago and then states that first fall was 03/2015.    Pt has had multiple falls over that time. (5 falls)  Fell at work on 09/13/16 carrying a box and tripped and fell over a mat and hit her face and chin and bruised her eyes and hurt her knees.  "I dislocated my teeth."   2 days later she fell when making bed a home and lost balance.  States that her boss would not allow FMLA because not enough employees and there has been a contentious relationship with him.  States that her boss won't pay for her workers comp.   States that she has PTSD from treatment from this and needs medication for this.   States that before Christmas she fell at a friends house in the driveway which was covered with leaves and hurt her ankle and she saw the doctor at [facility] and had her ankle put in a boot.  She had another fall in which she tripped over the sidewalk at her house and she fell in the grass.  She states that "two times I nearly slipped out of the truck on our running boards."  States that she hit her collar bone.    Pt denies DM but is on metformin.  States that she had gestational DM.  States that her A1C is 5.7.  States that her A1C has never been higher than 5.9.    States that she has lost 60lb over the last 10 years and 30 lbs over the last year.    Neuroimaging of the brain has not previously been performed.  States that her dentist told her that she needed an MRI brain.   ALLERGIES:   Allergies  Allergen Reactions  . Other     Etholine Oxide: causes itching and hives    CURRENT MEDICATIONS:  Outpatient Encounter Prescriptions as of 10/26/2016  Medication Sig  . ALPRAZolam  (XANAX) 0.5 MG tablet TAKE 1 TABLET THREE TIMES A DAY AS NEEDED FOR ANXIETY  . amphetamine-dextroamphetamine (ADDERALL) 10 MG tablet Take 10 mg by mouth daily with breakfast.  . buPROPion (WELLBUTRIN XL) 300 MG 24 hr tablet Take 300 mg by mouth daily.  . furosemide (LASIX) 20 MG tablet Take 20 mg by mouth daily.  . Levothyroxine Sodium (SYNTHROID PO) Take by mouth.  . metFORMIN (GLUCOPHAGE-XR) 500 MG 24 hr tablet Take 500 mg by mouth every evening.  . pantoprazole (PROTONIX) 40 MG tablet Take 40 mg by mouth daily.  . pravastatin (PRAVACHOL) 10 MG tablet Take 10 mg by mouth daily.  . sertraline (ZOLOFT) 100 MG tablet Take 100 mg by mouth daily.  . traZODone (DESYREL) 50 MG tablet TAKE 1/2 TO 1 TABLET BY MOUTH AT BEDTIME AS NEEDED. (Patient taking differently: TAKE 1/2 TO 1 TABLET BY MOUTH AT BEDTIME AS NEEDED SLEEP)  . valACYclovir (VALTREX) 500 MG tablet Take 1 tablet (500 mg total) by mouth daily.  Marland Kitchen tiZANidine (ZANAFLEX) 4 MG tablet    No facility-administered encounter medications on file as of 10/26/2016.     PAST MEDICAL HISTORY:   Past Medical History:  Diagnosis Date  .  Anxiety   . Arthritis   . Carpal tunnel syndrome   . Cataract   . Depression   . Diabetes mellitus without complication (HCC)    Type 2  . Fall April 10, 2015  . GERD (gastroesophageal reflux disease)   . Goiter   . History of shingles    On abdomen, left ring finger, and left leg; Pt takes Valtrex  . Hyperlipidemia   . Hypertension   . Obesity   . PTSD (post-traumatic stress disorder)   . Shortness of breath dyspnea    pt stated its related to the back problems she has  . Thyroid disease   . Ulcer ([place])     PAST SURGICAL HISTORY:   Past Surgical History:  Procedure Laterality Date  . ABDOMINAL HYSTERECTOMY  1996  . [place]  . COLONOSCOPY W/ POLYPECTOMY    . KYPHOPLASTY N/A 06/02/2015   Procedure: KYPHOPLASTY;  Surgeon: Phylliss Bob, MD;  Location: [facility];  Service:  Orthopedics;  Laterality: N/A;  Thoracic 3, 8, 10 kyphoplasty    SOCIAL HISTORY:   Social History   Social History  . Marital status: Widowed    Spouse name: N/A  . Number of children: N/A  . Years of education: N/A   Occupational History  . Not on file.   Social History Main Topics  . Smoking status: Former Smoker    Types: Cigarettes    Quit date: 06/28/2016  . Smokeless tobacco: Former Systems developer    Quit date: 08/17/2010     Comment: smokes about 3-4 cigs per day  . Alcohol use Yes     Comment: rare glass of wine   . Drug use: No  . Sexual activity: Not Currently   Other Topics Concern  . Not on file   Social History Narrative  . No narrative on file    FAMILY HISTORY:   Family Status  Relation Status  . Mother Alive  . Father Deceased  . Neg Hx     ROS:  No lateralizing weakness/paresthesias.  States that ortho told her that R hip was "weaker" than the left and "I hit that at work on the way down."  A complete 10 system review of systems was obtained and was unremarkable apart from what is mentioned above.  PHYSICAL EXAMINATION:    VITALS:   Vitals:   10/26/16 0922  BP: 120/76  Pulse: 90  SpO2: 95%  Weight: 183 lb (83 kg)  Height: '5\' 3"'$  (1.6 m)    GEN:  Normal appears female in no acute distress.  Appears stated age. HEENT:  Normocephalic, atraumatic. The mucous membranes are moist. The superficial temporal arteries are without ropiness or tenderness. Cardiovascular: Regular rate and rhythm. Lungs: Clear to auscultation bilaterally. Neck/Heme: There are no carotid bruits noted bilaterally.  NEUROLOGICAL: Orientation:  The patient is alert and oriented x 3.  Fund of knowledge is appropriate.  Recent and remote memory intact.  Attention span and concentration normal.  Repeats and names without difficulty. Cranial nerves: There is good facial symmetry. The pupils are equal round and reactive to light bilaterally. Fundoscopic exam reveals clear disc margins  bilaterally. Extraocular muscles are intact and visual fields are full to confrontational testing. Speech is fluent and clear. Soft palate rises symmetrically and there is no tongue deviation. Hearing is intact to conversational tone. Tone: Tone is good throughout. Sensation: Sensation is intact to light touch and pinprick throughout (facial, trunk, extremities). Does report decreased pin  toward the KNEE compared to toward the ankle in the R leg only (opposite of stocking distribution).  Vibration is intact at the bilateral big toe. There is no extinction with double simultaneous stimulation. There is no sensory dermatomal level identified. Coordination:  The patient has no difficulty with RAM's or FNF bilaterally. Motor: Strength is 5/5 in the bilateral upper and lower extremities.  Shoulder shrug is equal and symmetric. There is no pronator drift.  There are no fasciculations noted. DTR's: Deep tendon reflexes are 2/4 at the bilateral biceps, triceps, brachioradialis, patella and 1/4 at the bilateral achilles.  Plantar responses are downgoing bilaterally. Gait and Station: The patient arises out of the chair with her cane.  She is initially slow and purposeful.  When asked to walk in a tandem fashion, she has some astasia-abasia, but with encouragement, she actually is able to do that for a few steps and then becomes somewhat off balance.  She is able to stand in the Romberg position with eyes open and closed.  She then walks back to the examination room without her cane (at my request).   Abnormal movements:  She does have tremor of the outstretched hands that increase slightly with intention.  She has no rest tremor.     IMPRESSION/PLAN  1. Falls  -I really saw no primary neurologic etiology for falls.  -We'll do lab work including TSH, B12, RPR, SPEP/UPEP with immunofixation  -We will do an MRI of the brain.  I told the patient that we certainly will see cerebral small vessel disease given her  history of tobacco use.  May atrophy as well, but I told her that I just want to make sure that we are not missing anything else.  -Recommended balance therapy.  She stated that she is alert and engaging in balance therapy.  -Continue using ambulatory assistive device.  -We'll have an EMG to make sure that we are not missing contributing neuropathy  2.  Tremor  -could be from the Adderall.  Was told the same thing from her primary care physician.  3.  Will call her with the results of the above.  Will follow-up with [place] on an as-needed basis.  Greater than 50% of the 45 minute visit in counseling discussing safety   Cc:  WEBB, Valla Leaver, MD

## 2016-10-25 ENCOUNTER — Encounter: Payer: Medicare Other | Admitting: Physical Therapy

## 2016-10-25 DIAGNOSIS — R262 Difficulty in walking, not elsewhere classified: Secondary | ICD-10-CM | POA: Diagnosis not present

## 2016-10-25 DIAGNOSIS — M546 Pain in thoracic spine: Secondary | ICD-10-CM | POA: Diagnosis not present

## 2016-10-25 DIAGNOSIS — Z9181 History of falling: Secondary | ICD-10-CM | POA: Diagnosis not present

## 2016-10-25 DIAGNOSIS — M545 Low back pain: Secondary | ICD-10-CM | POA: Diagnosis not present

## 2016-10-26 ENCOUNTER — Encounter: Payer: Self-pay | Admitting: Neurology

## 2016-10-26 ENCOUNTER — Ambulatory Visit (INDEPENDENT_AMBULATORY_CARE_PROVIDER_SITE_OTHER): Payer: Medicare Other | Admitting: Neurology

## 2016-10-26 ENCOUNTER — Other Ambulatory Visit: Payer: Medicare Other

## 2016-10-26 ENCOUNTER — Other Ambulatory Visit: Payer: Self-pay | Admitting: Neurology

## 2016-10-26 VITALS — BP 120/76 | HR 90 | Ht 63.0 in | Wt 183.0 lb

## 2016-10-26 DIAGNOSIS — E0590 Thyrotoxicosis, unspecified without thyrotoxic crisis or storm: Secondary | ICD-10-CM

## 2016-10-26 DIAGNOSIS — R2681 Unsteadiness on feet: Secondary | ICD-10-CM

## 2016-10-26 DIAGNOSIS — R251 Tremor, unspecified: Secondary | ICD-10-CM

## 2016-10-26 DIAGNOSIS — Z5181 Encounter for therapeutic drug level monitoring: Secondary | ICD-10-CM | POA: Diagnosis not present

## 2016-10-26 DIAGNOSIS — R296 Repeated falls: Secondary | ICD-10-CM | POA: Diagnosis not present

## 2016-10-26 DIAGNOSIS — M545 Low back pain: Secondary | ICD-10-CM | POA: Diagnosis not present

## 2016-10-26 LAB — COMPREHENSIVE METABOLIC PANEL
ALBUMIN: 4.1 g/dL (ref 3.6–5.1)
ALK PHOS: 89 U/L (ref 33–130)
ALT: 11 U/L (ref 6–29)
AST: 16 U/L (ref 10–35)
BILIRUBIN TOTAL: 0.5 mg/dL (ref 0.2–1.2)
BUN: 13 mg/dL (ref 7–25)
CHLORIDE: 104 mmol/L (ref 98–110)
CO2: 25 mmol/L (ref 20–31)
CREATININE: 0.83 mg/dL (ref 0.50–0.99)
Calcium: 9.1 mg/dL (ref 8.6–10.4)
Glucose, Bld: 98 mg/dL (ref 65–99)
Potassium: 4.2 mmol/L (ref 3.5–5.3)
SODIUM: 141 mmol/L (ref 135–146)
TOTAL PROTEIN: 6.9 g/dL (ref 6.1–8.1)

## 2016-10-26 NOTE — Progress Notes (Signed)
Patient was unable to provide urine sample of SPEP lab today.

## 2016-10-26 NOTE — Patient Instructions (Signed)
1. Your provider has requested that you have labwork completed today. Please go to [facility] ([phone]) on the second floor of this building before leaving the office today. You do not need to check in. If you are not called within 15 minutes please check with the front desk.   2. We have sent a referral to [facility] for your MRI and they will call you directly to schedule your appt. They are located at [place]. If you need to contact them directly please call [phone].  3. We will schedule EMG.

## 2016-10-27 LAB — TSH: TSH: 3.7 mIU/L

## 2016-10-27 LAB — RPR

## 2016-10-27 LAB — VITAMIN B12: VITAMIN B 12: 267 pg/mL (ref 200–1100)

## 2016-10-29 ENCOUNTER — Ambulatory Visit: Payer: Medicare Other

## 2016-10-29 LAB — PROTEIN ELECTROPHORESIS,RANDOM URN

## 2016-10-29 LAB — IMMUNOFIXATION INTE

## 2016-10-30 ENCOUNTER — Telehealth: Payer: Self-pay | Admitting: Neurology

## 2016-10-30 ENCOUNTER — Encounter: Payer: Medicare Other | Admitting: Physical Therapy

## 2016-10-30 DIAGNOSIS — R262 Difficulty in walking, not elsewhere classified: Secondary | ICD-10-CM | POA: Diagnosis not present

## 2016-10-30 DIAGNOSIS — M545 Low back pain: Secondary | ICD-10-CM | POA: Diagnosis not present

## 2016-10-30 DIAGNOSIS — M546 Pain in thoracic spine: Secondary | ICD-10-CM | POA: Diagnosis not present

## 2016-10-30 DIAGNOSIS — Z9181 History of falling: Secondary | ICD-10-CM | POA: Diagnosis not present

## 2016-10-30 LAB — PROTEIN ELECTROPHORESIS, SERUM
Albumin ELP: 4.2 g/dL (ref 3.8–4.8)
Alpha-1-Globulin: 0.3 g/dL (ref 0.2–0.3)
Alpha-2-Globulin: 0.8 g/dL (ref 0.5–0.9)
Beta 2: 0.4 g/dL (ref 0.2–0.5)
Beta Globulin: 0.5 g/dL (ref 0.4–0.6)
Gamma Globulin: 0.7 g/dL — ABNORMAL LOW (ref 0.8–1.7)
TOTAL PROTEIN, SERUM ELECTROPHOR: 6.9 g/dL (ref 6.1–8.1)

## 2016-10-30 LAB — IMMUNOFIXATION ELECTROPHORESIS
IGG (IMMUNOGLOBIN G), SERUM: 655 mg/dL — AB (ref 694–1618)
IgA: 285 mg/dL (ref 81–463)
IgM, Serum: 132 mg/dL (ref 48–271)

## 2016-10-30 NOTE — Telephone Encounter (Signed)
-----   Message from [place], DO sent at 10/30/2016  4:07 PM EDT ----- Let pt know her B12 is low and I recommend she take a B12 supplement 1000 mcg daily

## 2016-10-30 NOTE — Telephone Encounter (Signed)
Left message on machine for patient to call back.

## 2016-10-31 IMAGING — RF DG C-ARM 61-120 MIN
1 series · 2 of 2 positions shown · non-contrast
Comparison: MRI thoracic spine 05/05/2015

CLINICAL DATA: Thoracic compression fractures.

EXAM:
DG C-ARM 61-120 MIN; THORACIC SPINE - 4+ VIEW

[Series 1: run · 2 of 2 slices shown]
[im 1/2]
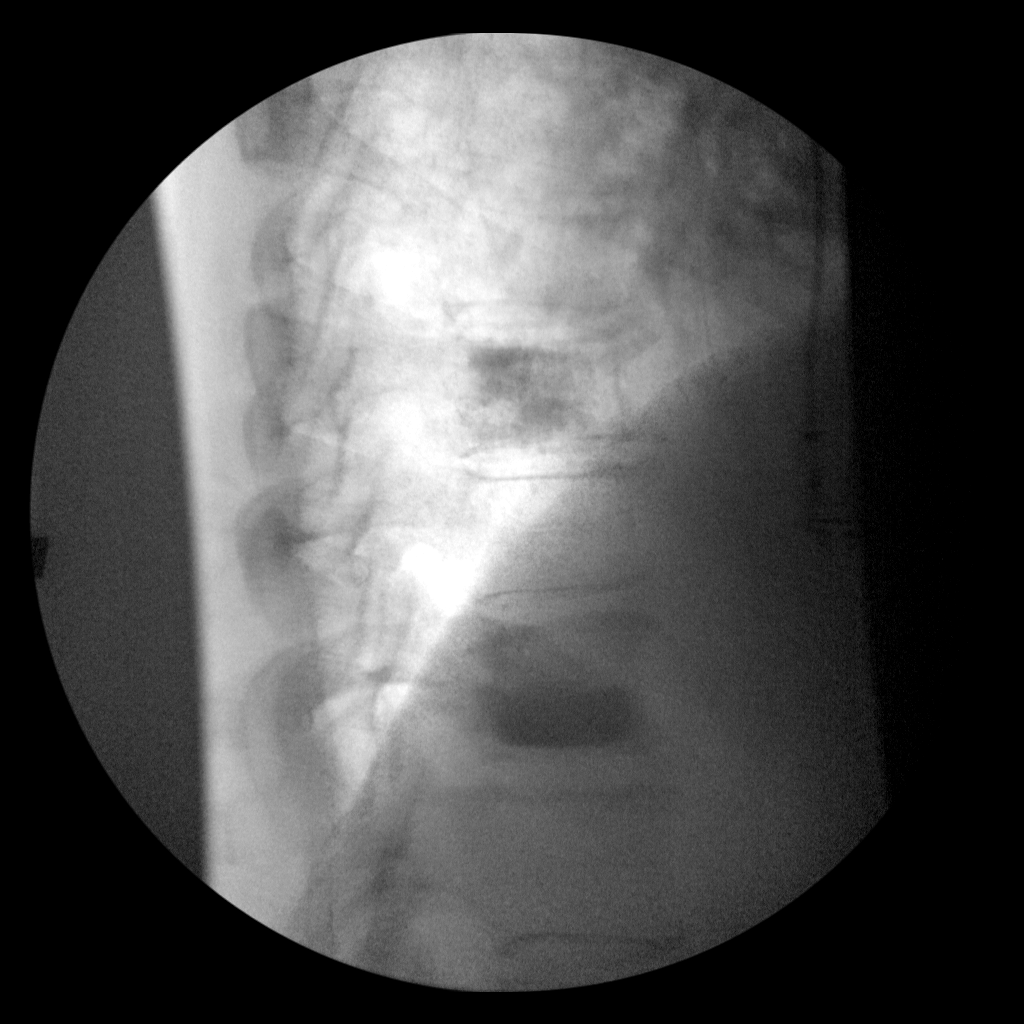
[im 2/2]
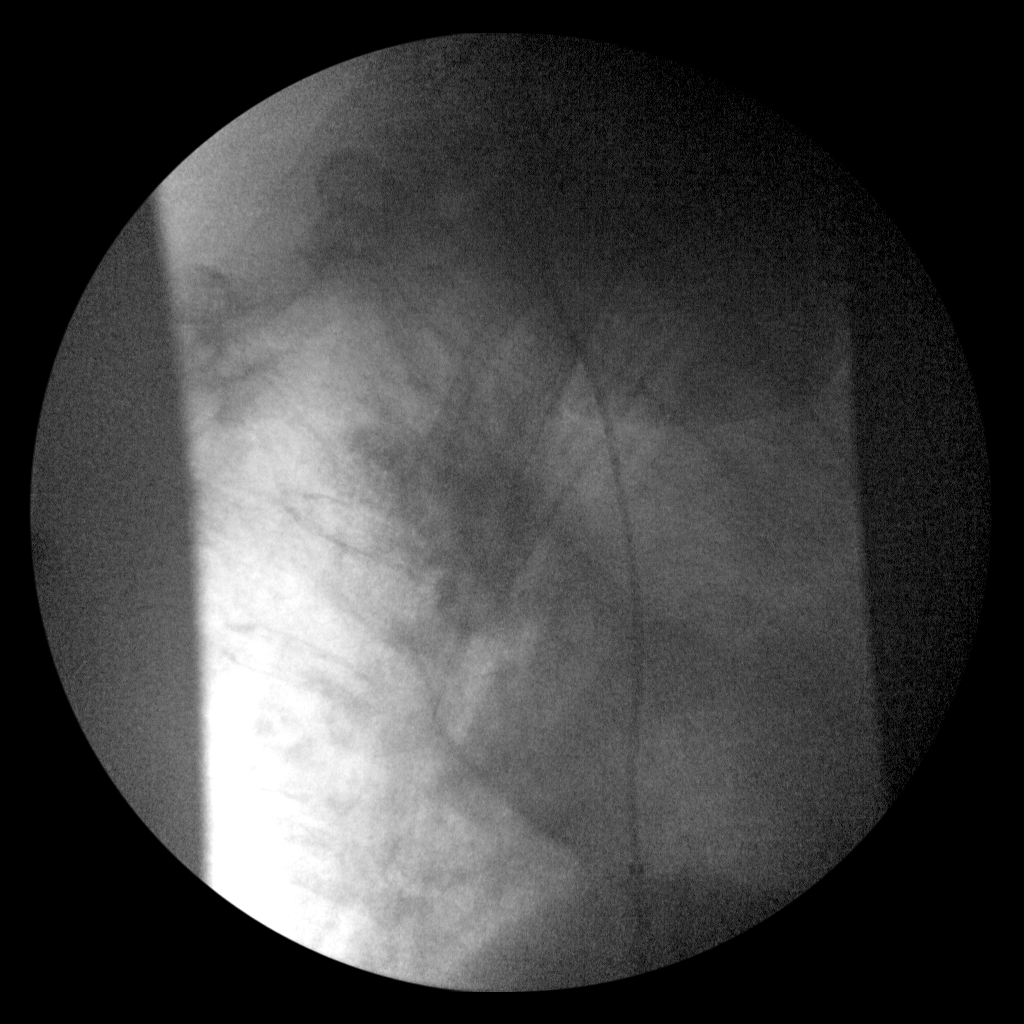

[2 of 2 positions shown; findings below may reference images not displayed]

FINDINGS: Fluoroscopic spot images demonstrate vertebral augmentation changes
at T3, T8 and T10.
IMPRESSION: Vertebral augmentation changes without complicating features.

## 2016-11-01 ENCOUNTER — Encounter: Payer: Medicare Other | Admitting: Physical Therapy

## 2016-11-01 DIAGNOSIS — Z9181 History of falling: Secondary | ICD-10-CM | POA: Diagnosis not present

## 2016-11-01 DIAGNOSIS — M546 Pain in thoracic spine: Secondary | ICD-10-CM | POA: Diagnosis not present

## 2016-11-01 DIAGNOSIS — M545 Low back pain: Secondary | ICD-10-CM | POA: Diagnosis not present

## 2016-11-01 DIAGNOSIS — R262 Difficulty in walking, not elsewhere classified: Secondary | ICD-10-CM | POA: Diagnosis not present

## 2016-11-01 LAB — IMMUNOFIXATION INTE

## 2016-11-01 NOTE — Telephone Encounter (Signed)
Spoke with patient and made her aware. She will call with any questions.

## 2016-11-02 LAB — PROTEIN ELECTROPHORESIS,RANDOM URN
Creatinine, Urine: 188 mg/dL (ref 20–320)
PROTEIN CREATININE RATIO: 48 mg/g{creat} (ref 21–161)
Total Protein, Urine: 9 mg/dL (ref 5–24)

## 2016-11-05 ENCOUNTER — Other Ambulatory Visit: Payer: Self-pay | Admitting: Neurology

## 2016-11-06 ENCOUNTER — Telehealth: Payer: Self-pay | Admitting: Neurology

## 2016-11-06 ENCOUNTER — Encounter: Payer: Medicare Other | Admitting: Physical Therapy

## 2016-11-06 ENCOUNTER — Ambulatory Visit (INDEPENDENT_AMBULATORY_CARE_PROVIDER_SITE_OTHER): Payer: Medicare Other | Admitting: Neurology

## 2016-11-06 DIAGNOSIS — R2681 Unsteadiness on feet: Secondary | ICD-10-CM

## 2016-11-06 DIAGNOSIS — R296 Repeated falls: Secondary | ICD-10-CM

## 2016-11-06 DIAGNOSIS — E0590 Thyrotoxicosis, unspecified without thyrotoxic crisis or storm: Secondary | ICD-10-CM

## 2016-11-06 DIAGNOSIS — M545 Low back pain: Secondary | ICD-10-CM | POA: Diagnosis not present

## 2016-11-06 DIAGNOSIS — M546 Pain in thoracic spine: Secondary | ICD-10-CM | POA: Diagnosis not present

## 2016-11-06 DIAGNOSIS — Z5181 Encounter for therapeutic drug level monitoring: Secondary | ICD-10-CM

## 2016-11-06 DIAGNOSIS — R251 Tremor, unspecified: Secondary | ICD-10-CM

## 2016-11-06 DIAGNOSIS — R262 Difficulty in walking, not elsewhere classified: Secondary | ICD-10-CM | POA: Diagnosis not present

## 2016-11-06 DIAGNOSIS — Z9181 History of falling: Secondary | ICD-10-CM | POA: Diagnosis not present

## 2016-11-06 NOTE — Telephone Encounter (Signed)
-----   Message from [place], DO sent at 11/06/2016  2:23 PM EDT ----- Let pt know that EMG was normal

## 2016-11-06 NOTE — Telephone Encounter (Signed)
Left message for patient to call back for results.  

## 2016-11-06 NOTE — Procedures (Signed)
[facility]  [place], [place], [place] [address] Tel: [phone] Fax:  [phone] Test Date:  11/06/2016  Patient: Jenny Cardenas DOB: 1951-07-10 Physician: Narda Amber, DO  Sex: Female Height: '5\' 3"'$  Ref Phys: Alonza Bogus, D.O.  ID#: [id number] Temp: 34.6C Technician:    Patient Complaints: This is a 66 year-old female referred for evaluation of gait instability and falls.  NCV & EMG Findings: Extensive electrodiagnostic testing of the right lower extremity and additional studies of the left shows:  1. Bilateral sural and superficial peroneal sensory responses are within normal limits. 2. Bilateral peroneal and tibial motor responses are within normal limits. 3. Bilateral tibial H reflex studies are within normal limits. 4. There is no evidence of active or chronic motor axon loss changes affecting any of the tested muscles. Motor unit configuration and recruitment pattern is within normal limits.  Impression: This is a normal study of the lower extremities. In particular, there is no evidence of a generalized sensorimotor polyneuropathy or lumbosacral radiculopathy.   ___________________________ Narda Amber, DO    Nerve Conduction Studies Anti Sensory Summary Table   Site NR Peak (ms) Norm Peak (ms) P-T Amp (V) Norm P-T Amp  Left Sup Peroneal Anti Sensory (Ant Lat Mall)  12 cm    2.7 <4.6 12.4 >3  Right Sup Peroneal Anti Sensory (Ant Lat Mall)  12 cm    2.8 <4.6 15.0 >3  Left Sural Anti Sensory (Lat Mall)  Calf    3.2 <4.6 12.8 >3  Right Sural Anti Sensory (Lat Mall)  Calf    3.1 <4.6 13.9 >3   Motor Summary Table   Site NR Onset (ms) Norm Onset (ms) O-P Amp (mV) Norm O-P Amp Site1 Site2 Delta-0 (ms) Dist (cm) Vel (m/s) Norm Vel (m/s)  Left Peroneal Motor (Ext Dig Brev)  Ankle    2.7 <6.0 5.8 >2.5 B Fib Ankle 7.0 34.5 52 >40  Poplt    9.7  5.7  Poplt B Fib 1.3 10.0 60 >40  Site 4    11.0  5.8         Right Peroneal Motor (Ext Dig  Brev)  Ankle    3.4 <6.0 5.4 >2.5 B Fib Ankle 6.8 34.0 50 >40  B Fib    10.2  5.4  Poplt B Fib 1.6 10.0 62 >40  Poplt    11.8  5.3         Left Tibial Motor (Abd Hall Brev)  Ankle    3.3 <6.0 9.7 >4 Knee Ankle 7.7 39.0 51 >40  Knee    11.0  5.7         Right Tibial Motor (Abd Hall Brev)  Ankle    5.9 <6.0 9.1 >4 Knee Ankle 7.8 38.0 49 >40  Knee    13.7  6.8          H Reflex Studies   NR H-Lat (ms) Lat Norm (ms) L-R H-Lat (ms)  Left Tibial (Gastroc)     31.02 <35 0.54  Right Tibial (Gastroc)     30.48 <35 0.54   EMG   Side Muscle Ins Act Fibs Psw Fasc Number Recrt Dur Dur. Amp Amp. Poly Poly. Comment  Right AntTibialis Nml Nml Nml Nml Nml Nml Nml Nml Nml Nml Nml Nml N/A  Right Gastroc Nml Nml Nml Nml Nml Nml Nml Nml Nml Nml Nml Nml N/A  Right Flex Dig Long Nml Nml Nml Nml Nml Nml Nml Nml Nml  Nml Nml Nml N/A  Right RectFemoris Nml Nml Nml Nml Nml Nml Nml Nml Nml Nml Nml Nml N/A  Right GluteusMed Nml Nml Nml Nml Nml Nml Nml Nml Nml Nml Nml Nml N/A  Left AntTibialis Nml Nml Nml Nml Nml Nml Nml Nml Nml Nml Nml Nml N/A  Left Gastroc Nml Nml Nml Nml Nml Nml Nml Nml Nml Nml Nml Nml N/A  Left Flex Dig Long Nml Nml Nml Nml Nml Nml Nml Nml Nml Nml Nml Nml N/A  Left RectFemoris Nml Nml Nml Nml Nml Nml Nml Nml Nml Nml Nml Nml N/A  Left GluteusMed Nml Nml Nml Nml Nml Nml Nml Nml Nml Nml Nml Nml N/A      Waveforms:

## 2016-11-07 NOTE — Telephone Encounter (Signed)
Spoke with patient and made her aware of EMG results. She asked that MR results that she is having Sunday go to Dr. Justin Mend and Dr. Lynann Bologna.

## 2016-11-07 NOTE — Telephone Encounter (Signed)
[name] left a message she wanted her EMG results/Jenny Cardenas

## 2016-11-08 ENCOUNTER — Encounter: Payer: Medicare Other | Admitting: Physical Therapy

## 2016-11-08 DIAGNOSIS — M546 Pain in thoracic spine: Secondary | ICD-10-CM | POA: Diagnosis not present

## 2016-11-08 DIAGNOSIS — Z9181 History of falling: Secondary | ICD-10-CM | POA: Diagnosis not present

## 2016-11-08 DIAGNOSIS — M545 Low back pain: Secondary | ICD-10-CM | POA: Diagnosis not present

## 2016-11-08 DIAGNOSIS — R262 Difficulty in walking, not elsewhere classified: Secondary | ICD-10-CM | POA: Diagnosis not present

## 2016-11-10 ENCOUNTER — Ambulatory Visit
Admission: RE | Admit: 2016-11-10 | Discharge: 2016-11-10 | Disposition: A | Discharge status: Home / Self Care | Payer: Medicare Other | Source: Ambulatory Visit | Attending: Neurology | Admitting: Neurology

## 2016-11-10 DIAGNOSIS — R296 Repeated falls: Secondary | ICD-10-CM

## 2016-11-10 DIAGNOSIS — R2681 Unsteadiness on feet: Secondary | ICD-10-CM

## 2016-11-10 DIAGNOSIS — E0590 Thyrotoxicosis, unspecified without thyrotoxic crisis or storm: Secondary | ICD-10-CM

## 2016-11-10 DIAGNOSIS — R251 Tremor, unspecified: Secondary | ICD-10-CM

## 2016-11-10 DIAGNOSIS — Z5181 Encounter for therapeutic drug level monitoring: Secondary | ICD-10-CM

## 2016-11-12 ENCOUNTER — Telehealth: Payer: Self-pay | Admitting: Neurology

## 2016-11-12 NOTE — Telephone Encounter (Signed)
Patient made aware.

## 2016-11-12 NOTE — Telephone Encounter (Signed)
-----   Message from [place], DO sent at 11/12/2016  7:39 AM EDT ----- Rare (perhaps 2) T2 hyperintensities.  Yasir Kitner, let pt know that her MRI brain looks good.  Her EMG also was good.  I have no NEUROLOGIC reason for her falls.  With the exception of her slightly low B12, everything looked good.  Doesn't need f/u here.  Advise to f/u with PCP.

## 2016-11-13 DIAGNOSIS — M545 Low back pain: Secondary | ICD-10-CM | POA: Diagnosis not present

## 2016-11-13 DIAGNOSIS — Z9181 History of falling: Secondary | ICD-10-CM | POA: Diagnosis not present

## 2016-11-13 DIAGNOSIS — M546 Pain in thoracic spine: Secondary | ICD-10-CM | POA: Diagnosis not present

## 2016-11-13 DIAGNOSIS — R262 Difficulty in walking, not elsewhere classified: Secondary | ICD-10-CM | POA: Diagnosis not present

## 2016-11-15 DIAGNOSIS — M546 Pain in thoracic spine: Secondary | ICD-10-CM | POA: Diagnosis not present

## 2016-11-15 DIAGNOSIS — R262 Difficulty in walking, not elsewhere classified: Secondary | ICD-10-CM | POA: Diagnosis not present

## 2016-11-15 DIAGNOSIS — Z9181 History of falling: Secondary | ICD-10-CM | POA: Diagnosis not present

## 2016-11-15 DIAGNOSIS — M545 Low back pain: Secondary | ICD-10-CM | POA: Diagnosis not present

## 2016-11-16 ENCOUNTER — Ambulatory Visit
Admission: RE | Admit: 2016-11-16 | Discharge: 2016-11-16 | Disposition: A | Discharge status: Home / Self Care | Payer: Medicare Other | Source: Ambulatory Visit | Attending: Family Medicine | Admitting: Family Medicine

## 2016-11-16 DIAGNOSIS — Z1231 Encounter for screening mammogram for malignant neoplasm of breast: Secondary | ICD-10-CM

## 2016-11-20 DIAGNOSIS — M546 Pain in thoracic spine: Secondary | ICD-10-CM | POA: Diagnosis not present

## 2016-11-20 DIAGNOSIS — Z9181 History of falling: Secondary | ICD-10-CM | POA: Diagnosis not present

## 2016-11-20 DIAGNOSIS — R262 Difficulty in walking, not elsewhere classified: Secondary | ICD-10-CM | POA: Diagnosis not present

## 2016-11-20 DIAGNOSIS — M545 Low back pain: Secondary | ICD-10-CM | POA: Diagnosis not present

## 2016-11-22 DIAGNOSIS — M546 Pain in thoracic spine: Secondary | ICD-10-CM | POA: Diagnosis not present

## 2016-11-22 DIAGNOSIS — Z9181 History of falling: Secondary | ICD-10-CM | POA: Diagnosis not present

## 2016-11-22 DIAGNOSIS — R262 Difficulty in walking, not elsewhere classified: Secondary | ICD-10-CM | POA: Diagnosis not present

## 2016-11-22 DIAGNOSIS — M545 Low back pain: Secondary | ICD-10-CM | POA: Diagnosis not present

## 2016-11-27 DIAGNOSIS — R262 Difficulty in walking, not elsewhere classified: Secondary | ICD-10-CM | POA: Diagnosis not present

## 2016-11-27 DIAGNOSIS — Z9181 History of falling: Secondary | ICD-10-CM | POA: Diagnosis not present

## 2016-11-27 DIAGNOSIS — M546 Pain in thoracic spine: Secondary | ICD-10-CM | POA: Diagnosis not present

## 2016-11-27 DIAGNOSIS — M545 Low back pain: Secondary | ICD-10-CM | POA: Diagnosis not present

## 2016-12-03 DIAGNOSIS — F331 Major depressive disorder, recurrent, moderate: Secondary | ICD-10-CM | POA: Diagnosis not present

## 2016-12-03 DIAGNOSIS — M810 Age-related osteoporosis without current pathological fracture: Secondary | ICD-10-CM | POA: Diagnosis not present

## 2016-12-03 DIAGNOSIS — M4850XA Collapsed vertebra, not elsewhere classified, site unspecified, initial encounter for fracture: Secondary | ICD-10-CM | POA: Diagnosis not present

## 2016-12-03 DIAGNOSIS — M1990 Unspecified osteoarthritis, unspecified site: Secondary | ICD-10-CM | POA: Diagnosis not present

## 2016-12-17 DIAGNOSIS — M19041 Primary osteoarthritis, right hand: Secondary | ICD-10-CM | POA: Diagnosis not present

## 2016-12-17 DIAGNOSIS — M19042 Primary osteoarthritis, left hand: Secondary | ICD-10-CM | POA: Diagnosis not present

## 2016-12-17 DIAGNOSIS — F988 Other specified behavioral and emotional disorders with onset usually occurring in childhood and adolescence: Secondary | ICD-10-CM | POA: Diagnosis not present

## 2017-02-15 DIAGNOSIS — Z6833 Body mass index (BMI) 33.0-33.9, adult: Secondary | ICD-10-CM | POA: Diagnosis not present

## 2017-02-15 DIAGNOSIS — Z23 Encounter for immunization: Secondary | ICD-10-CM | POA: Diagnosis not present

## 2017-02-15 DIAGNOSIS — Z0000 Encounter for general adult medical examination without abnormal findings: Secondary | ICD-10-CM | POA: Diagnosis not present

## 2017-02-15 DIAGNOSIS — E538 Deficiency of other specified B group vitamins: Secondary | ICD-10-CM | POA: Diagnosis not present

## 2017-02-15 DIAGNOSIS — E119 Type 2 diabetes mellitus without complications: Secondary | ICD-10-CM | POA: Diagnosis not present

## 2017-02-15 DIAGNOSIS — F321 Major depressive disorder, single episode, moderate: Secondary | ICD-10-CM | POA: Diagnosis not present

## 2017-02-15 DIAGNOSIS — F988 Other specified behavioral and emotional disorders with onset usually occurring in childhood and adolescence: Secondary | ICD-10-CM | POA: Diagnosis not present

## 2017-02-15 DIAGNOSIS — F331 Major depressive disorder, recurrent, moderate: Secondary | ICD-10-CM | POA: Diagnosis not present

## 2017-02-15 DIAGNOSIS — I10 Essential (primary) hypertension: Secondary | ICD-10-CM | POA: Diagnosis not present

## 2017-02-15 DIAGNOSIS — E0590 Thyrotoxicosis, unspecified without thyrotoxic crisis or storm: Secondary | ICD-10-CM | POA: Diagnosis not present

## 2017-02-15 DIAGNOSIS — M19041 Primary osteoarthritis, right hand: Secondary | ICD-10-CM | POA: Diagnosis not present

## 2017-02-15 DIAGNOSIS — E785 Hyperlipidemia, unspecified: Secondary | ICD-10-CM | POA: Diagnosis not present

## 2017-04-22 NOTE — Progress Notes (Signed)
Office Visit Note  Patient: Jenny Cardenas             Date of Birth: [birth year]           MRN: [id number]             PCP: Maurice Small, MD Referring: Maurice Small, MD Visit Date: 05/03/2017 Occupation: '@GUAROCC' @    Subjective:  New Patient (Initial Visit) (OSTEOARTHITIS, BIL HAND, TENDERNESS)   History of Present Illness: Jenny Cardenas is a 66 y.o. female seen in consultation per request of her PCP. According to patient her symptoms started in August 2016 after she fell and had vertebral fractures. She states in October 2016 she underwent kyphoplasty. She had a bone density in January 2017 according to notes from her PCP which was normal. She states she had fallen 5 times in the last 6 months mostly tripping on objects. Her last fall was sent to work in February 2018. She states after the fall she was laid off. She has pain and discomfort in her bilateral hands she's noticed that her hands and interesting for the last 1 year. She has decreased grip strength. She also describes pain in her bilateral knee joints right hip and bilateral feet. She denies any joint swelling. She states she was seen by her rheumatologist couple of years ago who diagnosed her with osteoarthritis and told her that she wants HLA-B27 positive.  Activities of Daily Living:  Patient reports morning stiffness for 10 minutes.   Patient Denies nocturnal pain.  Difficulty dressing/grooming: Denies Difficulty climbing stairs: Reports Difficulty getting out of chair: Reports Difficulty using hands for taps, buttons, cutlery, and/or writing: Reports   Review of Systems  Constitutional: Positive for fatigue. Negative for night sweats, weight gain, weight loss and weakness.  HENT: Positive for mouth dryness. Negative for mouth sores, trouble swallowing, trouble swallowing and nose dryness.   Eyes: Negative for pain, redness, visual disturbance and dryness.  Respiratory: Negative for cough, shortness of breath and  difficulty breathing.   Cardiovascular: Negative for chest pain, palpitations, hypertension, irregular heartbeat and swelling in legs/feet.  Gastrointestinal: Negative for blood in stool, constipation and diarrhea.  Endocrine: Negative for increased urination.  Genitourinary: Negative for vaginal dryness.  Musculoskeletal: Positive for arthralgias, joint pain and morning stiffness. Negative for joint swelling, myalgias, muscle weakness, muscle tenderness and myalgias.  Skin: Negative for color change, rash, hair loss, skin tightness, ulcers and sensitivity to sunlight.  Allergic/Immunologic: Negative for susceptible to infections.  Neurological: Negative for dizziness, memory loss and night sweats.  Hematological: Negative for swollen glands.  Psychiatric/Behavioral: Positive for depressed mood. Negative for sleep disturbance. The patient is not nervous/anxious.     PMFS History:  Patient Active Problem List   Diagnosis Date Noted  . Primary osteoarthritis of both hands 04/26/2017  . History of vertebral fracture s/p kyphoplasty  04/26/2017  . Hyperthyroidism 09/28/2013  . Multinodular goiter 09/28/2013  . Rash and nonspecific skin eruption 01/31/2013  . Rash and other nonspecific skin eruption 10/16/2010  . DIAB W/OTH MANIFESTS TYPE II/UNS TYPE UNCNTRL 04/10/2010  . OSTEOPENIA 08/27/2008  . UNS ADVRS EFF UNS RX MEDICINAL&BIOLOGICAL SBSTNC 09/18/2007  . CARPAL TUNNEL SYNDROME 03/04/2007  . ACTINIC KERATOSIS 03/04/2007  . DEPRESSION 02/13/2007  . HYPERLIPIDEMIA 12/25/2006  . OBESITY 12/25/2006  . HYPERTENSION 12/25/2006    Past Medical History:  Diagnosis Date  . Anxiety   . Arthritis   . Carpal tunnel syndrome   . Cataract   . Depression   .  Diabetes mellitus without complication (HCC)    Type 2  . Fall April 10, 2015  . GERD (gastroesophageal reflux disease)   . Goiter   . History of shingles    On abdomen, left ring finger, and left leg; Pt takes Valtrex  .  Hyperlipidemia   . Hypertension   . Obesity   . PTSD (post-traumatic stress disorder)   . Shortness of breath dyspnea    pt stated its related to the back problems she has  . Thyroid disease   . Ulcer     Family History  Problem Relation Age of Onset  . COPD Mother   . Depression Mother   . Hypertension Mother   . Hyperlipidemia Mother   . Breast cancer Mother 74  . Alzheimer's disease Father   . Heart disease Father   . Breast cancer Sister 51  . Colon cancer Neg Hx    Past Surgical History:  Procedure Laterality Date  . ABDOMINAL HYSTERECTOMY  1996  . CATARACT EXTRACTION, BILATERAL    . [place]  . COLONOSCOPY W/ POLYPECTOMY    . KYPHOPLASTY N/A 06/02/2015   Procedure: KYPHOPLASTY;  Surgeon: Phylliss Bob, MD;  Location: [facility];  Service: Orthopedics;  Laterality: N/A;  Thoracic 3, 8, 10 kyphoplasty   Social History   Social History Narrative  . No narrative on file     Objective: Vital Signs: BP (!) 148/73 (BP Location: Left Arm, Patient Position: Sitting, Cuff Size: Normal)   Pulse 84   Resp 17   Ht 5' 1.5" (1.562 m)   Wt 183 lb (83 kg)   BMI 34.02 kg/m    Physical Exam  Constitutional: She is oriented to person, place, and time. She appears well-developed and well-nourished.  Overweight female  HENT:  Head: Normocephalic and atraumatic.  Eyes: Conjunctivae and EOM are normal.  Neck: Normal range of motion.  Cardiovascular: Normal rate, regular rhythm, normal heart sounds and intact distal pulses.   Pulmonary/Chest: Effort normal and breath sounds normal.  Abdominal: Soft. Bowel sounds are normal.  Lymphadenopathy:    She has no cervical adenopathy.  Neurological: She is alert and oriented to person, place, and time.  Skin: Skin is warm and dry. Capillary refill takes less than 2 seconds.  Psychiatric: She has a normal mood and affect. Her behavior is normal.  Nursing note and vitals reviewed.    Musculoskeletal Exam:  C-spine good range of motion. She had limited range of motion of her thoracic and lumbar spine. She had some tenderness on palpation of bilateral SI joints. Discomfort range of motion of her shoulder joints. Elbow joints wrist joints are good range of motion. She had no MCP joint swelling or synovitis. She has PIP DIP thickening bilaterally. She has some inflammatory change in her bilateral fourth PIP joint. She had painful range of motion of her right hip joint. Knee joints are good range of motion with some crepitus. She has DIP PIP thickening in her bilateral feet.  CDAI Exam: No CDAI exam completed.    Investigation: Findings:  10/26/16 SPEP One or more serum protein fractions are outside the normal ranges. No abnormal protein bands are apparent.   TSH 3.70 RPR non reactive   10/2016 Normal EMG lower extremities performed at [facility]     Imaging: Xr Hip Unilat W Or W/o Pelvis 2-3 Views Right  Result Date: 05/03/2017 No significant hip joint narrowing was noted. No SI joint narrowing was noted. Mild  osteoarthritic changes were noted.  Xr Foot 2 Views Left  Result Date: 05/03/2017 Left first MTP, all PIP/DIP narrowing was noted. Small inferior calcaneal spur was noted. Impression: These findings are consistent with osteoarthritis of the foot.  Xr Foot 2 Views Right  Result Date: 05/03/2017 Right first MTP, all PIP/DIP narrowing was noted. Small inferior calcaneal spur was noted. Impression: These findings are consistent with osteoarthritis of the foot.  Xr Hand 2 View Left  Result Date: 05/03/2017 No MCP or intercarpal joint space narrowing was noted. PIP/DIP narrowing was noted. Severe narrowing erosive change of the fourth DIP joint was noted. CMC narrowing was noted. Impression: These findings are consistent with osteoarthritis.  Xr Hand 2 View Right  Result Date: 05/03/2017 No MCP or intercarpal joint space narrowing was noted. PIP and DIP narrowing was noted without  any erosive changes. CMC narrowing was noted. Impression: These findings are consistent with osteoarthritis.  Xr Knee 3 View Left  Result Date: 05/03/2017 Mild medial compartment narrowing, intercondylar osteophytes, no chondrocalcinosis, moderate patellofemoral narrowing. Impression: These findings are consistent with mild osteoarthritis and moderate chondromalacia patella  Xr Knee 3 View Right  Result Date: 05/03/2017 Mild medial compartment narrowing, intercondylar osteophytes, no chondrocalcinosis was noted. Tibial tuberosity prominence was noted. Moderate to severe patellofemoral narrowing was noted. Impression: Mild osteoarthritis moderate chondromalacia patella, Osgood-Schlatter.   Speciality Comments: No specialty comments available.    Procedures:  No procedures performed Allergies: Other   Assessment / Plan:     Visit Diagnoses: Primary osteoarthritis of both hands - she is clinical features of osteoarthritis with DIP PIP thickening. She also has an inflammatory component to her osteoarthritis. A handout on muscle strengthening exercises was given. A list of natural anti-inflammatories was also given. Plan: XR Hand 2 View Left, XR Hand 2 View Right. X-rays are consistent with osteoarthritis. Detailed counseling was provided.  Right fourth and fifth trigger finger: I offered cortisone injection which she declined. I will give her a prescription for Voltaren gel that can be used topically. She can also use Voltaren gel over her hands.  Foot pain, bilateral -she has DIP PIP thickening in her feet. Proper fitting shoes with low heels were discussed. Plan: XR Foot 2 Views Left, XR Foot 2 Views Right. X-rays are consistent with osteoarthritis.   Pain in right hip - Plan: XR HIP UNILAT W OR W/O PELVIS 2-3 VIEWS RIGHT. Her x-ray was unremarkable.  Chronic pain of both knees -a handout on lower extremity muscle strengthening exercise was given. Plan: XR KNEE 3 VIEW LEFT, XR KNEE 3 VIEW  RIGHT. X-rays revealed mild osteoarthritis and chondromalacia patella.  Screening for osteoporosis -  Normal DEXA 08/2015 per primary care Dr  Maurice Small  History of vertebral fracture s/p kyphoplasty in October 2016  Gait instability: Patient reports several falls in the last 6 months. I plan to refer her to physical therapy for lower extremity muscle strengthening and fall prevention.  History of hypertension: Her systolic blood pressure is elevated today. I've advised her to monitor that.  Her other medical problems are listed as follows:  History of diabetes mellitus  History of depression  History of cardiomyopathy  History of hypothyroidism    Orders: Orders Placed This Encounter  Procedures  . XR Foot 2 Views Left  . XR Foot 2 Views Right  . XR HIP UNILAT W OR W/O PELVIS 2-3 VIEWS RIGHT  . XR Hand 2 View Left  . XR Hand 2 View Right  . XR  KNEE 3 VIEW LEFT  . XR KNEE 3 VIEW RIGHT   No orders of the defined types were placed in this encounter.   Face-to-face time spent with patient was 50 minutes. Greater than 50% of time was spent in counseling and coordination of care.  Follow-Up Instructions: Return if symptoms worsen or fail to improve, for Osteoarthritis.   Bo Merino, MD  Note - This record has been created using Editor, commissioning.  Chart creation errors have been sought, but may not always  have been located. Such creation errors do not reflect on  the standard of medical care.

## 2017-04-26 DIAGNOSIS — M19041 Primary osteoarthritis, right hand: Secondary | ICD-10-CM | POA: Insufficient documentation

## 2017-04-26 DIAGNOSIS — M19042 Primary osteoarthritis, left hand: Principal | ICD-10-CM

## 2017-04-26 DIAGNOSIS — Z8781 Personal history of (healed) traumatic fracture: Secondary | ICD-10-CM | POA: Insufficient documentation

## 2017-05-03 ENCOUNTER — Telehealth: Payer: Self-pay | Admitting: Rheumatology

## 2017-05-03 ENCOUNTER — Telehealth: Payer: Self-pay | Admitting: Radiology

## 2017-05-03 ENCOUNTER — Ambulatory Visit (INDEPENDENT_AMBULATORY_CARE_PROVIDER_SITE_OTHER): Payer: Medicare Other | Admitting: Rheumatology

## 2017-05-03 ENCOUNTER — Ambulatory Visit (INDEPENDENT_AMBULATORY_CARE_PROVIDER_SITE_OTHER): Payer: Self-pay

## 2017-05-03 ENCOUNTER — Ambulatory Visit (INDEPENDENT_AMBULATORY_CARE_PROVIDER_SITE_OTHER): Payer: Medicare Other

## 2017-05-03 ENCOUNTER — Encounter: Payer: Self-pay | Admitting: Rheumatology

## 2017-05-03 VITALS — BP 148/73 | HR 84 | Resp 17 | Ht 61.5 in | Wt 183.0 lb

## 2017-05-03 DIAGNOSIS — M19041 Primary osteoarthritis, right hand: Secondary | ICD-10-CM

## 2017-05-03 DIAGNOSIS — M19042 Primary osteoarthritis, left hand: Secondary | ICD-10-CM

## 2017-05-03 DIAGNOSIS — M65341 Trigger finger, right ring finger: Secondary | ICD-10-CM | POA: Diagnosis not present

## 2017-05-03 DIAGNOSIS — Z8639 Personal history of other endocrine, nutritional and metabolic disease: Secondary | ICD-10-CM | POA: Diagnosis not present

## 2017-05-03 DIAGNOSIS — Z8679 Personal history of other diseases of the circulatory system: Secondary | ICD-10-CM

## 2017-05-03 DIAGNOSIS — M25551 Pain in right hip: Secondary | ICD-10-CM

## 2017-05-03 DIAGNOSIS — M25542 Pain in joints of left hand: Secondary | ICD-10-CM

## 2017-05-03 DIAGNOSIS — M25571 Pain in right ankle and joints of right foot: Secondary | ICD-10-CM | POA: Diagnosis not present

## 2017-05-03 DIAGNOSIS — Z8781 Personal history of (healed) traumatic fracture: Secondary | ICD-10-CM

## 2017-05-03 DIAGNOSIS — R2681 Unsteadiness on feet: Secondary | ICD-10-CM | POA: Diagnosis not present

## 2017-05-03 DIAGNOSIS — M25572 Pain in left ankle and joints of left foot: Secondary | ICD-10-CM

## 2017-05-03 DIAGNOSIS — Z8659 Personal history of other mental and behavioral disorders: Secondary | ICD-10-CM | POA: Diagnosis not present

## 2017-05-03 DIAGNOSIS — M79672 Pain in left foot: Secondary | ICD-10-CM

## 2017-05-03 DIAGNOSIS — M25561 Pain in right knee: Secondary | ICD-10-CM | POA: Diagnosis not present

## 2017-05-03 DIAGNOSIS — Z23 Encounter for immunization: Secondary | ICD-10-CM | POA: Diagnosis not present

## 2017-05-03 DIAGNOSIS — G8929 Other chronic pain: Secondary | ICD-10-CM

## 2017-05-03 DIAGNOSIS — M25541 Pain in joints of right hand: Secondary | ICD-10-CM | POA: Diagnosis not present

## 2017-05-03 DIAGNOSIS — M25562 Pain in left knee: Secondary | ICD-10-CM

## 2017-05-03 DIAGNOSIS — M79671 Pain in right foot: Secondary | ICD-10-CM

## 2017-05-03 DIAGNOSIS — M65351 Trigger finger, right little finger: Secondary | ICD-10-CM

## 2017-05-03 DIAGNOSIS — Z13820 Encounter for screening for osteoporosis: Secondary | ICD-10-CM | POA: Diagnosis not present

## 2017-05-03 MED ORDER — DICLOFENAC SODIUM 1 % TD GEL: 2.0000 g | Freq: Four times a day (QID) | TRANSDERMAL | 3 refills | Status: DC

## 2017-05-03 MED ORDER — DICLOFENAC SODIUM 1 % TD GEL: 4.0000 g | Freq: Three times a day (TID) | TRANSDERMAL | Status: DC | PRN

## 2017-05-03 NOTE — Addendum Note (Signed)
Addended by: Shona Needles on: 05/03/2017 12:24 PM   Modules accepted: Orders

## 2017-05-03 NOTE — Telephone Encounter (Signed)
She was to have the voltaren gel, I have resent to her pharmacy.

## 2017-05-03 NOTE — Telephone Encounter (Signed)
Dr Estanislado Pandy did not prescribe any meds, I called patient to advise

## 2017-05-03 NOTE — Telephone Encounter (Signed)
Discussed with patient, she did need Voltaren gel, I have resent to pharmacy

## 2017-05-03 NOTE — Progress Notes (Signed)
Documentation note. I monitored patient as she left our office, she walked out of the office and got safely into her white chevrolet truck. There was no fall at any time in our office or leaving our office. This is for documentation only

## 2017-05-03 NOTE — Patient Instructions (Signed)
Natural anti-inflammatories  You can purchase these at [employer], [employer] or online.  . Turmeric (capsules)  . Ginger (ginger root or capsules)  . Omega 3 (Fish, flax seeds, chia seeds, walnuts, almonds)  . Tart cherry (dried or extract)   Patient should be under the care of a physician while taking these supplements. This may not be reproduced without the permission of Dr. Bo Merino.  Knee Exercises Ask your health care provider which exercises are safe for you. Do exercises exactly as told by your health care provider and adjust them as directed. It is normal to feel mild stretching, pulling, tightness, or discomfort as you do these exercises, but you should stop right away if you feel sudden pain or your pain gets worse.Do not begin these exercises until told by your health care provider. STRETCHING AND RANGE OF MOTION EXERCISES These exercises warm up your muscles and joints and improve the movement and flexibility of your knee. These exercises also help to relieve pain, numbness, and tingling. Exercise A: Knee Extension, Prone 1. Lie on your abdomen on a bed. 2. Place your left / right knee just beyond the edge of the surface so your knee is not on the bed. You can put a towel under your left / right thigh just above your knee for comfort. 3. Relax your leg muscles and allow gravity to straighten your knee. You should feel a stretch behind your left / right knee. 4. Hold this position for __________ seconds. 5. Scoot up so your knee is supported between repetitions. Repeat __________ times. Complete this stretch __________ times a day. Exercise B: Knee Flexion, Active  1. Lie on your back with both knees straight. If this causes back discomfort, bend your left / right knee so your foot is flat on the floor. 2. Slowly slide your left / right heel back toward your buttocks until you feel a gentle stretch in the front of your knee or thigh. 3. Hold this position for  __________ seconds. 4. Slowly slide your left / right heel back to the starting position. Repeat __________ times. Complete this exercise __________ times a day. Exercise C: Quadriceps, Prone  1. Lie on your abdomen on a firm surface, such as a bed or padded floor. 2. Bend your left / right knee and hold your ankle. If you cannot reach your ankle or pant leg, loop a belt around your foot and grab the belt instead. 3. Gently pull your heel toward your buttocks. Your knee should not slide out to the side. You should feel a stretch in the front of your thigh and knee. 4. Hold this position for __________ seconds. Repeat __________ times. Complete this stretch __________ times a day. Exercise D: Hamstring, Supine 1. Lie on your back. 2. Loop a belt or towel over the ball of your left / right foot. The ball of your foot is on the walking surface, right under your toes. 3. Straighten your left / right knee and slowly pull on the belt to raise your leg until you feel a gentle stretch behind your knee. ? Do not let your left / right knee bend while you do this. ? Keep your other leg flat on the floor. 4. Hold this position for __________ seconds. Repeat __________ times. Complete this stretch __________ times a day. STRENGTHENING EXERCISES These exercises build strength and endurance in your knee. Endurance is the ability to use your muscles for a long time, even after they get tired. Exercise E:  Quadriceps, Isometric  1. Lie on your back with your left / right leg extended and your other knee bent. Put a rolled towel or small pillow under your knee if told by your health care provider. 2. Slowly tense the muscles in the front of your left / right thigh. You should see your kneecap slide up toward your hip or see increased dimpling just above the knee. This motion will push the back of the knee toward the floor. 3. For __________ seconds, keep the muscle as tight as you can without increasing your  pain. 4. Relax the muscles slowly and completely. Repeat __________ times. Complete this exercise __________ times a day. Exercise F: Straight Leg Raises - Quadriceps 1. Lie on your back with your left / right leg extended and your other knee bent. 2. Tense the muscles in the front of your left / right thigh. You should see your kneecap slide up or see increased dimpling just above the knee. Your thigh may even shake a bit. 3. Keep these muscles tight as you raise your leg 4-6 inches (10-15 cm) off the floor. Do not let your knee bend. 4. Hold this position for __________ seconds. 5. Keep these muscles tense as you lower your leg. 6. Relax your muscles slowly and completely after each repetition. Repeat __________ times. Complete this exercise __________ times a day. Exercise G: Hamstring, Isometric 1. Lie on your back on a firm surface. 2. Bend your left / right knee approximately __________ degrees. 3. Dig your left / right heel into the surface as if you are trying to pull it toward your buttocks. Tighten the muscles in the back of your thighs to dig as hard as you can without increasing any pain. 4. Hold this position for __________ seconds. 5. Release the tension gradually and allow your muscles to relax completely for __________ seconds after each repetition. Repeat __________ times. Complete this exercise __________ times a day. Exercise H: Hamstring Curls  If told by your health care provider, do this exercise while wearing ankle weights. Begin with __________ weights. Then increase the weight by 1 lb (0.5 kg) increments. Do not wear ankle weights that are more than __________. 1. Lie on your abdomen with your legs straight. 2. Bend your left / right knee as far as you can without feeling pain. Keep your hips flat against the floor. 3. Hold this position for __________ seconds. 4. Slowly lower your leg to the starting position.  Repeat __________ times. Complete this exercise  __________ times a day. Exercise I: Squats (Quadriceps) 1. Stand in front of a table, with your feet and knees pointing straight ahead. You may rest your hands on the table for balance but not for support. 2. Slowly bend your knees and lower your hips like you are going to sit in a chair. ? Keep your weight over your heels, not over your toes. ? Keep your lower legs upright so they are parallel with the table legs. ? Do not let your hips go lower than your knees. ? Do not bend lower than told by your health care provider. ? If your knee pain increases, do not bend as low. 3. Hold the squat position for __________ seconds. 4. Slowly push with your legs to return to standing. Do not use your hands to pull yourself to standing. Repeat __________ times. Complete this exercise __________ times a day. Exercise J: Wall Slides (Quadriceps)  1. Lean your back against a smooth wall or door while  you walk your feet out 18-24 inches (46-61 cm) from it. 2. Place your feet hip-width apart. 3. Slowly slide down the wall or door until your knees bend __________ degrees. Keep your knees over your heels, not over your toes. Keep your knees in line with your hips. 4. Hold for __________ seconds. Repeat __________ times. Complete this exercise __________ times a day. Exercise K: Straight Leg Raises - Hip Abductors 1. Lie on your side with your left / right leg in the top position. Lie so your head, shoulder, knee, and hip line up. You may bend your bottom knee to help you keep your balance. 2. Roll your hips slightly forward so your hips are stacked directly over each other and your left / right knee is facing forward. 3. Leading with your heel, lift your top leg 4-6 inches (10-15 cm). You should feel the muscles in your outer hip lifting. ? Do not let your foot drift forward. ? Do not let your knee roll toward the ceiling. 4. Hold this position for __________ seconds. 5. Slowly return your leg to the starting  position. 6. Let your muscles relax completely after each repetition. Repeat __________ times. Complete this exercise __________ times a day. Exercise L: Straight Leg Raises - Hip Extensors 1. Lie on your abdomen on a firm surface. You can put a pillow under your hips if that is more comfortable. 2. Tense the muscles in your buttocks and lift your left / right leg about 4-6 inches (10-15 cm). Keep your knee straight as you lift your leg. 3. Hold this position for __________ seconds. 4. Slowly lower your leg to the starting position. 5. Let your leg relax completely after each repetition. Repeat __________ times. Complete this exercise __________ times a day. This information is not intended to replace advice given to you by your health care provider. Make sure you discuss any questions you have with your health care provider. Document Released: 06/13/2005 Document Revised: 04/23/2016 Document Reviewed: 06/05/2015 Elsevier Interactive Patient Education  2018 [place] Exercises Hand exercises can be helpful to almost anyone. These exercises can strengthen the hands, improve flexibility and movement, and increase blood flow to the hands. These results can make work and daily tasks easier. Hand exercises can be especially helpful for people who have joint pain from arthritis or have nerve damage from overuse (carpal tunnel syndrome). These exercises can also help people who have injured a hand. Most of these hand exercises are fairly gentle stretching routines. You can do them often throughout the day. Still, it is a good idea to ask your health care provider which exercises would be best for you. Warming your hands before exercise may help to reduce stiffness. You can do this with gentle massage or by placing your hands in warm water for 15 minutes. Also, make sure you pay attention to your level of hand pain as you begin an exercise routine. Exercises Knuckle Bend Repeat this exercise  5-10 times with each hand. 6. Stand or sit with your arm, hand, and all five fingers pointed straight up. Make sure your wrist is straight. 7. Gently and slowly bend your fingers down and inward until the tips of your fingers are touching the tops of your palm. 8. Hold this position for a few seconds. 9. Extend your fingers out to their original position, all pointing straight up again.  Finger Fan Repeat this exercise 5-10 times with each hand. 5. Hold your arm and hand out in  front of you. Keep your wrist straight. 6. Squeeze your hand into a fist. 7. Hold this position for a few seconds. 8. Edison Simon out, or spread apart, your hand and fingers as much as possible, stretching every joint fully.  Tabletop Repeat this exercise 5-10 times with each hand. 5. Stand or sit with your arm, hand, and all five fingers pointed straight up. Make sure your wrist is straight. 6. Gently and slowly bend your fingers at the knuckles where they meet the hand until your hand is making an upside-down L shape. Your fingers should form a tabletop. 7. Hold this position for a few seconds. 8. Extend your fingers out to their original position, all pointing straight up again.  Making Os Repeat this exercise 5-10 times with each hand. 1. Stand or sit with your arm, hand, and all five fingers pointed straight up. Make sure your wrist is straight. 2. Make an O shape by touching your pointer finger to your thumb. Hold for a few seconds. Then open your hand wide. 3. Repeat this motion with each finger on your hand.  Table Spread Repeat this exercise 5-10 times with each hand. 5. Place your hand on a table with your palm facing down. Make sure your wrist is straight. 6. Spread your fingers out as much as possible. Hold this position for a few seconds. 7. Slide your fingers back together again. Hold for a few seconds.  Ball Grip  Repeat this exercise 10-15 times with each hand. 7. Hold a tennis ball or another soft  ball in your hand. 8. While slowly increasing pressure, squeeze the ball as hard as possible. 9. Squeeze as hard as you can for 3-5 seconds. 10. Relax and repeat.  Wrist Curls Repeat this exercise 10-15 times with each hand. 6. Sit in a chair that has armrests. 7. Hold a light weight in your hand, such as a dumbbell that weighs 1-3 pounds (0.5-1.4 kg). Ask your health care provider what weight would be best for you. 8. Rest your hand just over the end of the chair arm with your palm facing up. 9. Gently pivot your wrist up and down while holding the weight. Do not twist your wrist from side to side.  Contact a health care provider if:  Your hand pain or discomfort gets much worse when you do an exercise.  Your hand pain or discomfort does not improve within 2 hours after you exercise. If you have any of these problems, stop doing these exercises right away. Do not do them again unless your health care provider says that you can. Get help right away if:  You develop sudden, severe hand pain. If this happens, stop doing these exercises right away. Do not do them again unless your health care provider says that you can. This information is not intended to replace advice given to you by your health care provider. Make sure you discuss any questions you have with your health care provider. Document Released: 07/11/2015 Document Revised: 01/05/2016 Document Reviewed: 02/07/2015 Elsevier Interactive Patient Education  [employer].

## 2017-05-03 NOTE — Telephone Encounter (Signed)
-----   Message from April J Beavers sent at 05/03/2017  1:11 PM EDT ----- Regarding: meds not called in Pt wanting to pick up meds that have not been called in [place]

## 2017-05-10 DIAGNOSIS — L720 Epidermal cyst: Secondary | ICD-10-CM | POA: Diagnosis not present

## 2017-05-10 DIAGNOSIS — D225 Melanocytic nevi of trunk: Secondary | ICD-10-CM | POA: Diagnosis not present

## 2017-05-10 DIAGNOSIS — L821 Other seborrheic keratosis: Secondary | ICD-10-CM | POA: Diagnosis not present

## 2017-05-10 DIAGNOSIS — L814 Other melanin hyperpigmentation: Secondary | ICD-10-CM | POA: Diagnosis not present

## 2017-05-10 DIAGNOSIS — D1801 Hemangioma of skin and subcutaneous tissue: Secondary | ICD-10-CM | POA: Diagnosis not present

## 2017-06-04 ENCOUNTER — Ambulatory Visit: Payer: Self-pay | Admitting: Rheumatology

## 2017-08-21 DIAGNOSIS — E785 Hyperlipidemia, unspecified: Secondary | ICD-10-CM | POA: Diagnosis not present

## 2017-08-21 DIAGNOSIS — Z7984 Long term (current) use of oral hypoglycemic drugs: Secondary | ICD-10-CM | POA: Diagnosis not present

## 2017-08-21 DIAGNOSIS — I10 Essential (primary) hypertension: Secondary | ICD-10-CM | POA: Diagnosis not present

## 2017-08-21 DIAGNOSIS — E538 Deficiency of other specified B group vitamins: Secondary | ICD-10-CM | POA: Diagnosis not present

## 2017-08-21 DIAGNOSIS — E119 Type 2 diabetes mellitus without complications: Secondary | ICD-10-CM | POA: Diagnosis not present

## 2017-08-21 DIAGNOSIS — F321 Major depressive disorder, single episode, moderate: Secondary | ICD-10-CM | POA: Diagnosis not present

## 2017-08-21 DIAGNOSIS — E0590 Thyrotoxicosis, unspecified without thyrotoxic crisis or storm: Secondary | ICD-10-CM | POA: Diagnosis not present

## 2017-10-04 ENCOUNTER — Other Ambulatory Visit: Payer: Self-pay | Admitting: Family Medicine

## 2017-10-04 DIAGNOSIS — Z1231 Encounter for screening mammogram for malignant neoplasm of breast: Secondary | ICD-10-CM

## 2017-12-10 ENCOUNTER — Ambulatory Visit
Admission: RE | Admit: 2017-12-10 | Discharge: 2017-12-10 | Disposition: A | Discharge status: Home / Self Care | Payer: Medicare Other | Source: Ambulatory Visit | Attending: Family Medicine | Admitting: Family Medicine

## 2017-12-10 DIAGNOSIS — Z1231 Encounter for screening mammogram for malignant neoplasm of breast: Secondary | ICD-10-CM

## 2018-01-21 DIAGNOSIS — H26493 Other secondary cataract, bilateral: Secondary | ICD-10-CM | POA: Diagnosis not present

## 2018-01-21 DIAGNOSIS — E119 Type 2 diabetes mellitus without complications: Secondary | ICD-10-CM | POA: Diagnosis not present

## 2018-01-21 DIAGNOSIS — H43811 Vitreous degeneration, right eye: Secondary | ICD-10-CM | POA: Diagnosis not present

## 2018-01-27 DIAGNOSIS — L723 Sebaceous cyst: Secondary | ICD-10-CM | POA: Diagnosis not present

## 2018-01-27 DIAGNOSIS — L989 Disorder of the skin and subcutaneous tissue, unspecified: Secondary | ICD-10-CM | POA: Diagnosis not present

## 2018-02-06 DIAGNOSIS — L0109 Other impetigo: Secondary | ICD-10-CM | POA: Diagnosis not present

## 2018-03-04 DIAGNOSIS — L905 Scar conditions and fibrosis of skin: Secondary | ICD-10-CM | POA: Diagnosis not present

## 2018-03-04 DIAGNOSIS — L0109 Other impetigo: Secondary | ICD-10-CM | POA: Diagnosis not present

## 2018-03-11 DIAGNOSIS — E538 Deficiency of other specified B group vitamins: Secondary | ICD-10-CM | POA: Diagnosis not present

## 2018-03-11 DIAGNOSIS — Z7984 Long term (current) use of oral hypoglycemic drugs: Secondary | ICD-10-CM | POA: Diagnosis not present

## 2018-03-11 DIAGNOSIS — E785 Hyperlipidemia, unspecified: Secondary | ICD-10-CM | POA: Diagnosis not present

## 2018-03-11 DIAGNOSIS — E119 Type 2 diabetes mellitus without complications: Secondary | ICD-10-CM | POA: Diagnosis not present

## 2018-03-11 DIAGNOSIS — I10 Essential (primary) hypertension: Secondary | ICD-10-CM | POA: Diagnosis not present

## 2018-03-11 DIAGNOSIS — Z0000 Encounter for general adult medical examination without abnormal findings: Secondary | ICD-10-CM | POA: Diagnosis not present

## 2018-04-23 DIAGNOSIS — H18893 Other specified disorders of cornea, bilateral: Secondary | ICD-10-CM | POA: Diagnosis not present

## 2018-04-23 DIAGNOSIS — H02403 Unspecified ptosis of bilateral eyelids: Secondary | ICD-10-CM | POA: Diagnosis not present

## 2018-04-23 DIAGNOSIS — H04123 Dry eye syndrome of bilateral lacrimal glands: Secondary | ICD-10-CM | POA: Diagnosis not present

## 2018-04-23 DIAGNOSIS — H18452 Nodular corneal degeneration, left eye: Secondary | ICD-10-CM | POA: Diagnosis not present

## 2018-04-25 DIAGNOSIS — Z23 Encounter for immunization: Secondary | ICD-10-CM | POA: Diagnosis not present

## 2018-05-14 DIAGNOSIS — H26493 Other secondary cataract, bilateral: Secondary | ICD-10-CM | POA: Diagnosis not present

## 2018-05-14 DIAGNOSIS — H26491 Other secondary cataract, right eye: Secondary | ICD-10-CM | POA: Diagnosis not present

## 2018-05-14 DIAGNOSIS — Z961 Presence of intraocular lens: Secondary | ICD-10-CM | POA: Diagnosis not present

## 2018-05-14 DIAGNOSIS — H02403 Unspecified ptosis of bilateral eyelids: Secondary | ICD-10-CM | POA: Diagnosis not present

## 2018-06-03 DIAGNOSIS — H26492 Other secondary cataract, left eye: Secondary | ICD-10-CM | POA: Diagnosis not present

## 2018-06-15 ENCOUNTER — Emergency Department (HOSPITAL_COMMUNITY)
Admission: EM | Admit: 2018-06-15 | Discharge: 2018-06-15 | Disposition: A | Discharge status: Home / Self Care | Payer: Medicare PPO | Attending: Emergency Medicine | Admitting: Emergency Medicine

## 2018-06-15 ENCOUNTER — Emergency Department (HOSPITAL_COMMUNITY): Payer: Medicare PPO

## 2018-06-15 ENCOUNTER — Encounter (HOSPITAL_COMMUNITY): Payer: Self-pay | Admitting: Emergency Medicine

## 2018-06-15 DIAGNOSIS — M791 Myalgia, unspecified site: Secondary | ICD-10-CM | POA: Diagnosis present

## 2018-06-15 DIAGNOSIS — F419 Anxiety disorder, unspecified: Secondary | ICD-10-CM | POA: Insufficient documentation

## 2018-06-15 DIAGNOSIS — K1121 Acute sialoadenitis: Secondary | ICD-10-CM

## 2018-06-15 DIAGNOSIS — Z87891 Personal history of nicotine dependence: Secondary | ICD-10-CM | POA: Insufficient documentation

## 2018-06-15 DIAGNOSIS — E119 Type 2 diabetes mellitus without complications: Secondary | ICD-10-CM | POA: Diagnosis not present

## 2018-06-15 DIAGNOSIS — Z79899 Other long term (current) drug therapy: Secondary | ICD-10-CM | POA: Insufficient documentation

## 2018-06-15 DIAGNOSIS — I10 Essential (primary) hypertension: Secondary | ICD-10-CM | POA: Diagnosis not present

## 2018-06-15 DIAGNOSIS — E785 Hyperlipidemia, unspecified: Secondary | ICD-10-CM | POA: Diagnosis not present

## 2018-06-15 DIAGNOSIS — E042 Nontoxic multinodular goiter: Secondary | ICD-10-CM | POA: Diagnosis not present

## 2018-06-15 LAB — CBC WITH DIFFERENTIAL/PLATELET
Abs Immature Granulocytes: 0.07 10*3/uL (ref 0.00–0.07)
Basophils Absolute: 0.1 10*3/uL (ref 0.0–0.1)
Basophils Relative: 1 %
Eosinophils Absolute: 0.2 10*3/uL (ref 0.0–0.5)
Eosinophils Relative: 1 %
HCT: 39.2 % (ref 36.0–46.0)
Hemoglobin: 12.6 g/dL (ref 12.0–15.0)
Immature Granulocytes: 1 %
Lymphocytes Relative: 14 %
Lymphs Abs: 1.9 10*3/uL (ref 0.7–4.0)
MCH: 28.9 pg (ref 26.0–34.0)
MCHC: 32.1 g/dL (ref 30.0–36.0)
MCV: 89.9 fL (ref 80.0–100.0)
Monocytes Absolute: 0.8 10*3/uL (ref 0.1–1.0)
Monocytes Relative: 6 %
Neutro Abs: 10.8 10*3/uL — ABNORMAL HIGH (ref 1.7–7.7)
Neutrophils Relative %: 77 %
Platelets: 282 10*3/uL (ref 150–400)
RBC: 4.36 MIL/uL (ref 3.87–5.11)
RDW: 12.7 % (ref 11.5–15.5)
WBC: 13.9 10*3/uL — ABNORMAL HIGH (ref 4.0–10.5)
nRBC: 0 % (ref 0.0–0.2)

## 2018-06-15 LAB — TSH: TSH: 2.978 u[IU]/mL (ref 0.350–4.500)

## 2018-06-15 LAB — GROUP A STREP BY PCR: GROUP A STREP BY PCR: NOT DETECTED

## 2018-06-15 LAB — BASIC METABOLIC PANEL
Anion gap: 8 (ref 5–15)
BUN: 14 mg/dL (ref 8–23)
CO2: 29 mmol/L (ref 22–32)
Calcium: 8.7 mg/dL — ABNORMAL LOW (ref 8.9–10.3)
Chloride: 105 mmol/L (ref 98–111)
Creatinine, Ser: 1.07 mg/dL — ABNORMAL HIGH (ref 0.44–1.00)
GFR calc Af Amer: >60 mL/min (ref >60)
GFR calc non Af Amer: 52 mL/min — ABNORMAL LOW (ref >60)
Glucose, Bld: 105 mg/dL — ABNORMAL HIGH (ref 70–99)
Potassium: 3.2 mmol/L — ABNORMAL LOW (ref 3.5–5.1)
Sodium: 142 mmol/L (ref 135–145)

## 2018-06-15 LAB — I-STAT CREATININE, ED: Creatinine, Ser: 1 mg/dL (ref 0.44–1.00)

## 2018-06-15 MED ORDER — IOPAMIDOL (ISOVUE-300) INJECTION 61%
INTRAVENOUS | Status: AC
  Filled 2018-06-15: qty 100

## 2018-06-15 MED ORDER — AMOXICILLIN-POT CLAVULANATE 875-125 MG PO TABS
1.0000 | ORAL_TABLET | Freq: Two times a day (BID) | ORAL | 0 refills | Status: AC
Start: 2018-06-15 — End: 2018-06-22

## 2018-06-15 MED ORDER — SODIUM CHLORIDE 0.9 % IJ SOLN
INTRAMUSCULAR | Status: AC
  Administered 2018-06-15: 1 mL
  Filled 2018-06-15: qty 50

## 2018-06-15 MED ORDER — IOPAMIDOL (ISOVUE-300) INJECTION 61%
100.0000 mL | Freq: Once | INTRAVENOUS | Status: AC | PRN
  Administered 2018-06-15: 100 mL via INTRAVENOUS

## 2018-06-15 MED ORDER — PROCHLORPERAZINE EDISYLATE 10 MG/2ML IJ SOLN
10.0000 mg | Freq: Once | INTRAMUSCULAR | Status: AC
  Administered 2018-06-15: 10 mg via INTRAVENOUS
  Filled 2018-06-15: qty 2

## 2018-06-15 MED ORDER — ACETAMINOPHEN 325 MG PO TABS
650.0000 mg | ORAL_TABLET | Freq: Once | ORAL | Status: AC
  Administered 2018-06-15: 650 mg via ORAL
  Filled 2018-06-15: qty 2

## 2018-06-15 MED ORDER — MORPHINE SULFATE (PF) 2 MG/ML IV SOLN
2.0000 mg | Freq: Once | INTRAVENOUS | Status: AC
  Administered 2018-06-15: 2 mg via INTRAVENOUS
  Filled 2018-06-15: qty 1

## 2018-06-15 MED ORDER — ONDANSETRON 4 MG PO TBDP: 4.0000 mg | ORAL_TABLET | Freq: Three times a day (TID) | ORAL | 0 refills | Status: DC | PRN

## 2018-06-15 MED ORDER — DIPHENHYDRAMINE HCL 50 MG/ML IJ SOLN
25.0000 mg | Freq: Once | INTRAMUSCULAR | Status: AC
  Administered 2018-06-15: 25 mg via INTRAVENOUS
  Filled 2018-06-15: qty 1

## 2018-06-15 MED ORDER — SODIUM CHLORIDE 0.9 % IV BOLUS
1000.0000 mL | Freq: Once | INTRAVENOUS | Status: AC
  Administered 2018-06-15: 1000 mL via INTRAVENOUS

## 2018-06-15 MED ORDER — KETOROLAC TROMETHAMINE 30 MG/ML IJ SOLN
30.0000 mg | Freq: Once | INTRAMUSCULAR | Status: AC
  Administered 2018-06-15: 30 mg via INTRAVENOUS
  Filled 2018-06-15: qty 1

## 2018-06-15 NOTE — ED Provider Notes (Signed)
[facility] DEPT Provider Note   CSN: [id number] Arrival date & time: 06/15/18  1458     History   Chief Complaint Chief Complaint  Patient presents with  . Sore Throat    Pt presents c/o sore throat, nasal drainage, and painful swelling of L neck  . Generalized Body Aches  . Lymphadenopathy    HPI Jenny Cardenas is a 67 y.o. female with history of anxiety, arthritis, diabetes mellitus type 2, goiter, HLD, HTN presents today for evaluation of acute onset, progressively worsening myalgias, sore throat.  She states that symptoms began with a frontal headache 2 days ago.  She noted left-sided swelling along the jaw and neck acutely at around noon today which has been worsening.  She notes pain worsens with swallowing, palpation of the area, and movement of the neck.  She notes subjective fevers and chills as well as some nausea.  She has taken tramadol and ibuprofen with some improvement.  She feels as though she is having some difficulty swallowing and throat tightness.  She denies any new medications, exposure to any allergens, or new soaps, shampoos, detergents, or lotions.  She states she has been able to eat and drink without difficulty.  Denies chest pain, shortness of breath, abdominal pain, or vomiting.  The history is provided by the patient.    Past Medical History:  Diagnosis Date  . Anxiety   . Arthritis   . Carpal tunnel syndrome   . Cataract   . Depression   . Diabetes mellitus without complication (HCC)    Type 2  . Fall April 10, 2015  . GERD (gastroesophageal reflux disease)   . Goiter   . History of shingles    On abdomen, left ring finger, and left leg; Pt takes Valtrex  . Hyperlipidemia   . Hypertension   . Obesity   . PTSD (post-traumatic stress disorder)   . Shortness of breath dyspnea    pt stated its related to the back problems she has  . Thyroid disease   . Ulcer     Patient Active Problem List   Diagnosis Date  Noted  . Primary osteoarthritis of both hands 04/26/2017  . History of vertebral fracture s/p kyphoplasty  04/26/2017  . Hyperthyroidism 09/28/2013  . Multinodular goiter 09/28/2013  . Rash and nonspecific skin eruption 01/31/2013  . Rash and other nonspecific skin eruption 10/16/2010  . DIAB W/OTH MANIFESTS TYPE II/UNS TYPE UNCNTRL 04/10/2010  . OSTEOPENIA 08/27/2008  . UNS ADVRS EFF UNS RX MEDICINAL&BIOLOGICAL SBSTNC 09/18/2007  . CARPAL TUNNEL SYNDROME 03/04/2007  . ACTINIC KERATOSIS 03/04/2007  . DEPRESSION 02/13/2007  . HYPERLIPIDEMIA 12/25/2006  . OBESITY 12/25/2006  . HYPERTENSION 12/25/2006    Past Surgical History:  Procedure Laterality Date  . ABDOMINAL HYSTERECTOMY  1996  . CATARACT EXTRACTION, BILATERAL    . [place]  . COLONOSCOPY W/ POLYPECTOMY    . KYPHOPLASTY N/A 06/02/2015   Procedure: KYPHOPLASTY;  Surgeon: Phylliss Bob, MD;  Location: [facility];  Service: Orthopedics;  Laterality: N/A;  Thoracic 3, 8, 10 kyphoplasty     OB History   None      Home Medications    Prior to Admission medications   Medication Sig Start Date End Date Taking? Authorizing Provider  albuterol (PROVENTIL) (2.5 MG/3ML) 0.083% nebulizer solution Take 2.5 mg by nebulization every 4 (four) hours as needed for wheezing or shortness of breath.  02/27/17  Yes [provider]  ALPRAZolam (  XANAX) 0.5 MG tablet TAKE 1 TABLET THREE TIMES A DAY AS NEEDED FOR ANXIETY 08/18/13  Yes Ricard Dillon, MD  buPROPion (WELLBUTRIN XL) 150 MG 24 hr tablet Take 150 mg by mouth daily.   Yes [provider]  diclofenac sodium (VOLTAREN) 1 % GEL Apply 2 g topically 4 (four) times daily. 05/03/17  Yes Deveshwar, Abel Presto, MD  furosemide (LASIX) 20 MG tablet Take 20 mg by mouth daily.   Yes [provider]  levothyroxine (SYNTHROID, LEVOTHROID) 25 MCG tablet Take 25 mcg by mouth daily before breakfast.   Yes [provider]  metFORMIN (GLUCOPHAGE) 500 MG  tablet Take 500 mg by mouth at bedtime.   Yes [provider]  pantoprazole (PROTONIX) 40 MG tablet Take 40 mg by mouth daily.   Yes [provider]  pravastatin (PRAVACHOL) 10 MG tablet Take 10 mg by mouth daily.   Yes [provider]  sertraline (ZOLOFT) 100 MG tablet Take 150 mg by mouth daily.    Yes [provider]  tiZANidine (ZANAFLEX) 4 MG tablet Take 4 mg by mouth every 6 (six) hours as needed for muscle spasms.  09/25/16  Yes [provider]  traMADol (ULTRAM) 50 MG tablet Take 50 mg by mouth every 8 (eight) hours as needed. 02/15/17  Yes [provider]  traZODone (DESYREL) 50 MG tablet TAKE 1/2 TO 1 TABLET BY MOUTH AT BEDTIME AS NEEDED.   Yes Ricard Dillon, MD  valACYclovir (VALTREX) 500 MG tablet Take 1 tablet (500 mg total) by mouth daily. 04/20/13  Yes Ricard Dillon, MD  amoxicillin-clavulanate (AUGMENTIN) 875-125 MG tablet Take 1 tablet by mouth every 12 (twelve) hours for 7 days. 06/15/18 06/22/18  Rodell Perna A, PA-C  ondansetron (ZOFRAN ODT) 4 MG disintegrating tablet Take 1 tablet (4 mg total) by mouth every 8 (eight) hours as needed for nausea or vomiting. 06/15/18   Renita Papa, PA-C    Family History Family History  Problem Relation Age of Onset  . COPD Mother   . Depression Mother   . Hypertension Mother   . Hyperlipidemia Mother   . Breast cancer Mother 64  . Alzheimer's disease Father   . Heart disease Father   . Breast cancer Sister 68  . Colon cancer Neg Hx     Social History Social History   Tobacco Use  . Smoking status: Former Smoker    Packs/day: 0.50    Years: 4.00    Pack years: 2.00    Types: Cigarettes    Last attempt to quit: 06/28/2016    Years since quitting: 1.9  . Smokeless tobacco: Never Used  Substance Use Topics  . Alcohol use: Yes    Comment: rare glass of wine (holidays only)  . Drug use: No     Allergies   Other   Review of Systems Review of Systems  Constitutional:  Negative for chills and fever.  HENT: Positive for facial swelling, sore throat and trouble swallowing.   Respiratory: Negative for shortness of breath.   Cardiovascular: Negative for chest pain.  Gastrointestinal: Positive for nausea. Negative for abdominal pain, diarrhea and vomiting.  Musculoskeletal: Positive for myalgias.  Neurological: Positive for headaches.  All other systems reviewed and are negative.    Physical Exam Updated Vital Signs BP (!) 118/55   Pulse 87   Temp 98.2 F (36.8 C) (Oral)   Resp 16   SpO2 96%   Physical Exam  Constitutional: She appears well-developed and  well-nourished. No distress.  HENT:  Head: Normocephalic and atraumatic.  Right Ear: Tympanic membrane normal. No middle ear effusion.  Left Ear: Tympanic membrane normal.  Mouth/Throat: Uvula is midline, oropharynx is clear and moist and mucous membranes are normal. No oropharyngeal exudate, posterior oropharyngeal edema or posterior oropharyngeal erythema. Tonsils are 0 on the right. Tonsils are 0 on the left. No tonsillar exudate.  Left TM is mildly erythematous, no bulging.  Tolerating secretions without difficulty.  No abnormal phonation.  No upper airway stridor.  There is some fullness to the left jaw along the angle of the ramus down to the lateral aspect of the neck.  No subglossal or submental swelling.  Eyes: Conjunctivae are normal. Right eye exhibits no discharge. Left eye exhibits no discharge.  Neck: Normal range of motion. Neck supple. No JVD present. No tracheal deviation present.  Cardiovascular: Normal rate.  Pulmonary/Chest: Effort normal.  Abdominal: Soft. Bowel sounds are normal. She exhibits no distension. There is no tenderness.  Musculoskeletal: She exhibits no edema.  Neurological: She is alert.  Skin: Skin is warm and dry. No erythema.  Psychiatric: She has a normal mood and affect. Her behavior is normal.  Nursing note and vitals reviewed.    ED Treatments / Results    Labs (all labs ordered are listed, but only abnormal results are displayed) Labs Reviewed  BASIC METABOLIC PANEL - Abnormal; Notable for the following components:      Result Value   Potassium 3.2 (*)    Glucose, Bld 105 (*)    Creatinine, Ser 1.07 (*)    Calcium 8.7 (*)    GFR calc non Af Amer 52 (*)    All other components within normal limits  CBC WITH DIFFERENTIAL/PLATELET - Abnormal; Notable for the following components:   WBC 13.9 (*)    Neutro Abs 10.8 (*)    All other components within normal limits  GROUP A STREP BY PCR  TSH  I-STAT CREATININE, ED    EKG None  Radiology Ct Soft Tissue Neck W Contrast  Result Date: 06/15/2018 CLINICAL DATA:  Left neck swelling EXAM: CT NECK WITH CONTRAST TECHNIQUE: Multidetector CT imaging of the neck was performed using the standard protocol following the bolus administration of intravenous contrast. CONTRAST:  132mL ISOVUE-300 IOPAMIDOL (ISOVUE-300) INJECTION 61% COMPARISON:  None. FINDINGS: Pharynx and larynx: Normal. No mass or swelling. Salivary glands: Mild diffuse enlargement left parotid gland. Mild thickening and hyperenhancement of the overlying fascia. Mild stranding in the fat below the left parotid gland. No stone mass or abscess. Findings most likely due to left parotiditis which is mild Right parotid normal.  Submandibular gland normal bilaterally. Thyroid: Complex right thyroid nodule 15 mm with internal soft tissue nodule. 10 mm complex nodule left thyroid with coarse calcification. Lymph nodes: No enlarged cervical lymph nodes. Vascular: Negative Limited intracranial: Negative Visualized orbits: Not imaged Mastoids and visualized paranasal sinuses: Negative Skeleton: No acute abnormality. Cement in the T3 vertebral body from prior vertebral augmentation for fracture. Cement extends anteriorly into the paraspinous soft tissues likely within a vein. Upper chest: Negative Other: None IMPRESSION: Mild left parotid enlargement and  surrounding edema suggesting mild parotitis. No stone or abscess. Bilateral thyroid nodules as above.  Recommend thyroid ultrasound These results were called by telephone at the time of interpretation on 06/15/2018 at 6:33 pm to PA. Lynnmarie Lovett , who verbally acknowledged these results. Electronically Signed   By: Franchot Gallo M.D.   On: 06/15/2018 18:33  Procedures Procedures (including critical care time)  Medications Ordered in ED Medications  morphine 2 MG/ML injection 2 mg (2 mg Intravenous Given 06/15/18 1715)  iopamidol (ISOVUE-300) 61 % injection 100 mL (100 mLs Intravenous Contrast Given 06/15/18 1751)  sodium chloride 0.9 % injection (1 mL  Given by Other 06/15/18 1800)  acetaminophen (TYLENOL) tablet 650 mg (650 mg Oral Given 06/15/18 1950)  sodium chloride 0.9 % bolus 1,000 mL (0 mLs Intravenous Stopped 06/15/18 2043)  ketorolac (TORADOL) 30 MG/ML injection 30 mg (30 mg Intravenous Given 06/15/18 1946)  prochlorperazine (COMPAZINE) injection 10 mg (10 mg Intravenous Given 06/15/18 1945)  diphenhydrAMINE (BENADRYL) injection 25 mg (25 mg Intravenous Given 06/15/18 1945)     Initial Impression / Assessment and Plan / ED Course  I have reviewed the triage vital signs and the nursing notes.  Pertinent labs & imaging results that were available during my care of the patient were reviewed by me and considered in my medical decision making (see chart for details).     Patient presenting for evaluation of acute onset of sore throat and left-sided facial swelling.  Rectal temp of 100.7 F and mildly tachycardic on initial presentation with some hypertension noted.  Resolved on reevaluation after administration of antipyretics.  She is nontoxic in appearance, tolerating secretions without difficulty.  Airway appears patent.  No signs of angioedema or anaphylactic reaction.  Lab work reviewed by me significant for leukocytosis of 13.9, mildly elevated creatinine and mild hypokalemia.  Patient  appears somewhat dehydrated, given IV fluids in the ED.  She is also complaining of a headache, significantly improved after administration of migraine cocktail.  CT of the neck significant for apparent left-sided parotitis.  No evidence of PTA, retropharyngeal abscess, or other deep space neck abscess.  No meningeal signs to suggest meningitis.  Will cover with Augmentin and recommend follow-up with ENT for reevaluation.  On reassessment the patient is resting comfortably no apparent distress.  She states she is feeling much better.  She is tolerating p.o. fluids without difficulty.  Discussed strict ED return precautions.  Patient and patient's sister verbalized understanding of and agreement with plan and patient is stable for discharge home at this time.  Discussed with Dr. Francia Greaves who agrees with assessment and plan at this time.  Final Clinical Impressions(s) / ED Diagnoses   Final diagnoses:  Acute parotitis    ED Discharge Orders         Ordered    amoxicillin-clavulanate (AUGMENTIN) 875-125 MG tablet  Every 12 hours     06/15/18 2023    ondansetron (ZOFRAN ODT) 4 MG disintegrating tablet  Every 8 hours PRN     06/15/18 2023           Renita Papa, PA-C 06/16/18 1627    Valarie Merino, MD 06/20/18 1511

## 2018-06-15 NOTE — ED Triage Notes (Signed)
Pt presents to ED, BIB her sister from home, complaining of L neck swelling, generalized body aches, malaise, fatigue, and scratchy sore throat.  Symptoms onset yesterday.  Pt reports she feels cold, feverish, no fever upon arrival to ED.  Pt took motrin and tylenol this morning to help with pain.  Pain currently rated 6/10.  Pt reports concern for mumps which she had at age 67.

## 2018-06-15 NOTE — Discharge Instructions (Signed)
Please take all of your antibiotics until finished!   You may develop abdominal discomfort or diarrhea from the antibiotic.  You may help offset this with probiotics which you can buy or get in yogurt. Do not eat  or take the probiotics until 2 hours after your antibiotic.   Alternate 600 mg of ibuprofen and [phone] mg of Tylenol every 3 hours as needed for pain. Do not exceed 4000 mg of Tylenol daily.  Take ibuprofen with Tylenol to avoid upset stomach issues.  Drink plenty of fluids and get plenty of rest.  You can apply ice to the area of swelling and soreness for 20 minutes at a time up to 3 times a day.  Low up with your PCP or ENT for reevaluation of your symptoms.  Return to the emergency department if any concerning signs or symptoms develop such as persistent vomiting, throat tightness, drooling, difficulty swallowing food or fluids, shortness of breath, or chest pain

## 2018-06-26 DIAGNOSIS — Z0000 Encounter for general adult medical examination without abnormal findings: Secondary | ICD-10-CM | POA: Diagnosis not present

## 2018-06-26 DIAGNOSIS — E119 Type 2 diabetes mellitus without complications: Secondary | ICD-10-CM | POA: Diagnosis not present

## 2018-08-13 DIAGNOSIS — F909 Attention-deficit hyperactivity disorder, unspecified type: Secondary | ICD-10-CM

## 2018-08-13 HISTORY — DX: Attention-deficit hyperactivity disorder, unspecified type: F90.9

## 2018-09-30 ENCOUNTER — Encounter (HOSPITAL_COMMUNITY): Payer: Self-pay | Admitting: Emergency Medicine

## 2018-09-30 ENCOUNTER — Emergency Department (HOSPITAL_COMMUNITY): Payer: Medicare HMO

## 2018-09-30 ENCOUNTER — Other Ambulatory Visit: Payer: Self-pay

## 2018-09-30 ENCOUNTER — Inpatient Hospital Stay (HOSPITAL_COMMUNITY)
Admission: EM | Admit: 2018-09-30 | Discharge: 2018-10-04 | DRG: 690 | Disposition: A | Discharge status: Home / Self Care | Payer: Medicare HMO | Attending: Family Medicine | Admitting: Family Medicine

## 2018-09-30 DIAGNOSIS — E119 Type 2 diabetes mellitus without complications: Secondary | ICD-10-CM | POA: Diagnosis present

## 2018-09-30 DIAGNOSIS — F4310 Post-traumatic stress disorder, unspecified: Secondary | ICD-10-CM | POA: Diagnosis present

## 2018-09-30 DIAGNOSIS — N10 Acute tubulo-interstitial nephritis: Secondary | ICD-10-CM | POA: Diagnosis not present

## 2018-09-30 DIAGNOSIS — I10 Essential (primary) hypertension: Secondary | ICD-10-CM | POA: Diagnosis present

## 2018-09-30 DIAGNOSIS — Z803 Family history of malignant neoplasm of breast: Secondary | ICD-10-CM

## 2018-09-30 DIAGNOSIS — K219 Gastro-esophageal reflux disease without esophagitis: Secondary | ICD-10-CM | POA: Diagnosis present

## 2018-09-30 DIAGNOSIS — Z8249 Family history of ischemic heart disease and other diseases of the circulatory system: Secondary | ICD-10-CM

## 2018-09-30 DIAGNOSIS — H269 Unspecified cataract: Secondary | ICD-10-CM | POA: Diagnosis present

## 2018-09-30 DIAGNOSIS — N200 Calculus of kidney: Secondary | ICD-10-CM | POA: Diagnosis present

## 2018-09-30 DIAGNOSIS — E039 Hypothyroidism, unspecified: Secondary | ICD-10-CM | POA: Diagnosis present

## 2018-09-30 DIAGNOSIS — K5792 Diverticulitis of intestine, part unspecified, without perforation or abscess without bleeding: Secondary | ICD-10-CM

## 2018-09-30 DIAGNOSIS — Z818 Family history of other mental and behavioral disorders: Secondary | ICD-10-CM

## 2018-09-30 DIAGNOSIS — Z791 Long term (current) use of non-steroidal anti-inflammatories (NSAID): Secondary | ICD-10-CM

## 2018-09-30 DIAGNOSIS — Z79899 Other long term (current) drug therapy: Secondary | ICD-10-CM

## 2018-09-30 DIAGNOSIS — N12 Tubulo-interstitial nephritis, not specified as acute or chronic: Secondary | ICD-10-CM | POA: Diagnosis not present

## 2018-09-30 DIAGNOSIS — K5900 Constipation, unspecified: Secondary | ICD-10-CM | POA: Diagnosis present

## 2018-09-30 DIAGNOSIS — E669 Obesity, unspecified: Secondary | ICD-10-CM | POA: Diagnosis present

## 2018-09-30 DIAGNOSIS — R109 Unspecified abdominal pain: Secondary | ICD-10-CM | POA: Diagnosis not present

## 2018-09-30 DIAGNOSIS — F329 Major depressive disorder, single episode, unspecified: Secondary | ICD-10-CM | POA: Diagnosis present

## 2018-09-30 DIAGNOSIS — Z8619 Personal history of other infectious and parasitic diseases: Secondary | ICD-10-CM

## 2018-09-30 DIAGNOSIS — K5732 Diverticulitis of large intestine without perforation or abscess without bleeding: Secondary | ICD-10-CM | POA: Diagnosis present

## 2018-09-30 DIAGNOSIS — Z87442 Personal history of urinary calculi: Secondary | ICD-10-CM

## 2018-09-30 DIAGNOSIS — Z825 Family history of asthma and other chronic lower respiratory diseases: Secondary | ICD-10-CM

## 2018-09-30 DIAGNOSIS — Z90710 Acquired absence of both cervix and uterus: Secondary | ICD-10-CM

## 2018-09-30 DIAGNOSIS — Z79890 Hormone replacement therapy (postmenopausal): Secondary | ICD-10-CM

## 2018-09-30 DIAGNOSIS — Z87891 Personal history of nicotine dependence: Secondary | ICD-10-CM

## 2018-09-30 DIAGNOSIS — Z820 Family history of epilepsy and other diseases of the nervous system: Secondary | ICD-10-CM

## 2018-09-30 DIAGNOSIS — E785 Hyperlipidemia, unspecified: Secondary | ICD-10-CM | POA: Diagnosis present

## 2018-09-30 DIAGNOSIS — Z8349 Family history of other endocrine, nutritional and metabolic diseases: Secondary | ICD-10-CM

## 2018-09-30 DIAGNOSIS — Z6832 Body mass index (BMI) 32.0-32.9, adult: Secondary | ICD-10-CM

## 2018-09-30 DIAGNOSIS — R918 Other nonspecific abnormal finding of lung field: Secondary | ICD-10-CM | POA: Diagnosis present

## 2018-09-30 LAB — CBC
HCT: 36.4 % (ref 36.0–46.0)
HEMOGLOBIN: 11.4 g/dL — AB (ref 12.0–15.0)
MCH: 27.7 pg (ref 26.0–34.0)
MCHC: 31.3 g/dL (ref 30.0–36.0)
MCV: 88.6 fL (ref 80.0–100.0)
PLATELETS: 310 10*3/uL (ref 150–400)
RBC: 4.11 MIL/uL (ref 3.87–5.11)
RDW: 13.2 % (ref 11.5–15.5)
WBC: 17.9 10*3/uL — ABNORMAL HIGH (ref 4.0–10.5)
nRBC: 0 % (ref 0.0–0.2)

## 2018-09-30 LAB — URINALYSIS, ROUTINE W REFLEX MICROSCOPIC
Bilirubin Urine: NEGATIVE
Glucose, UA: NEGATIVE mg/dL
KETONES UR: NEGATIVE mg/dL
Nitrite: POSITIVE — AB
PH: 5 (ref 5.0–8.0)
Protein, ur: 100 mg/dL — AB
Specific Gravity, Urine: 1.017 (ref 1.005–1.030)

## 2018-09-30 LAB — COMPREHENSIVE METABOLIC PANEL
ALBUMIN: 3.5 g/dL (ref 3.5–5.0)
ALT: 11 U/L (ref 0–44)
AST: 16 U/L (ref 15–41)
Alkaline Phosphatase: 93 U/L (ref 38–126)
Anion gap: 9 (ref 5–15)
BILIRUBIN TOTAL: 0.8 mg/dL (ref 0.3–1.2)
BUN: 16 mg/dL (ref 8–23)
CALCIUM: 8.3 mg/dL — AB (ref 8.9–10.3)
CO2: 26 mmol/L (ref 22–32)
Chloride: 99 mmol/L (ref 98–111)
Creatinine, Ser: 1.09 mg/dL — ABNORMAL HIGH (ref 0.44–1.00)
GFR calc Af Amer: >60 mL/min (ref >60)
GFR calc non Af Amer: 52 mL/min — ABNORMAL LOW (ref >60)
GLUCOSE: 134 mg/dL — AB (ref 70–99)
Potassium: 3.7 mmol/L (ref 3.5–5.1)
Sodium: 134 mmol/L — ABNORMAL LOW (ref 135–145)
TOTAL PROTEIN: 7.3 g/dL (ref 6.5–8.1)

## 2018-09-30 LAB — LIPASE, BLOOD: Lipase: 28 U/L (ref 11–51)

## 2018-09-30 MED ORDER — KETOROLAC TROMETHAMINE 15 MG/ML IJ SOLN
15.0000 mg | Freq: Once | INTRAMUSCULAR | Status: AC
  Administered 2018-09-30: 15 mg via INTRAVENOUS
  Filled 2018-09-30: qty 1

## 2018-09-30 MED ORDER — SODIUM CHLORIDE 0.9 % IV SOLN
1.0000 g | Freq: Once | INTRAVENOUS | Status: AC
  Administered 2018-09-30: 1 g via INTRAVENOUS
  Filled 2018-09-30: qty 10

## 2018-09-30 MED ORDER — SODIUM CHLORIDE 0.9 % IV BOLUS
500.0000 mL | Freq: Once | INTRAVENOUS | Status: AC
  Administered 2018-09-30: 500 mL via INTRAVENOUS

## 2018-09-30 MED ORDER — METRONIDAZOLE IN NACL 5-0.79 MG/ML-% IV SOLN
500.0000 mg | Freq: Once | INTRAVENOUS | Status: AC
  Administered 2018-10-01: 500 mg via INTRAVENOUS
  Filled 2018-09-30: qty 100

## 2018-09-30 MED ORDER — ONDANSETRON HCL 4 MG/2ML IJ SOLN
4.0000 mg | Freq: Once | INTRAMUSCULAR | Status: AC
  Administered 2018-09-30: 4 mg via INTRAVENOUS
  Filled 2018-09-30: qty 2

## 2018-09-30 MED ORDER — SODIUM CHLORIDE 0.9% FLUSH: 3.0000 mL | Freq: Once | INTRAVENOUS | Status: DC

## 2018-09-30 MED ORDER — ACETAMINOPHEN 325 MG PO TABS
650.0000 mg | ORAL_TABLET | Freq: Once | ORAL | Status: DC
  Filled 2018-09-30: qty 2

## 2018-09-30 NOTE — ED Provider Notes (Signed)
[facility] DEPT Provider Note   CSN: [id number] Arrival date & time: 09/30/18  1549    History   Chief Complaint Chief Complaint  Patient presents with  . Emesis  . Flank Pain    HPI Jenny Cardenas is a 68 y.o. female.     HPI   Pt is a 68 y/o female with a h/o anxiety/depression, nephrolithiasis, diabetes, gerd, htn, hld, who presents to the ED today c/o left flank pain for the last 4 weeks.   Pt states that she passed a kidney stone about 4 days ago. Since then she has had nausea, vomiting and cannot hold anything down. States that the flank pain has persisted. It improves temporarily when she urinates but then returns again. Rates pain 10/10. Pain feels like her past kidney stones. Denies dysuria, but reports frequency, urgency, hematuria. She also c/o pain to the LLQ of the abdomen. This pain is intermittent. Reports intermittent alternating diarrhea and constipation. Denies bloody stools or hematemesis. Reports fevers/chills.   Past Medical History:  Diagnosis Date  . Anxiety   . Arthritis   . Carpal tunnel syndrome   . Cataract   . Depression   . Diabetes mellitus without complication (HCC)    Type 2  . Fall April 10, 2015  . GERD (gastroesophageal reflux disease)   . Goiter   . History of shingles    On abdomen, left ring finger, and left leg; Pt takes Valtrex  . Hyperlipidemia   . Hypertension   . Obesity   . PTSD (post-traumatic stress disorder)   . Shortness of breath dyspnea    pt stated its related to the back problems she has  . Thyroid disease   . Ulcer     Patient Active Problem List   Diagnosis Date Noted  . Primary osteoarthritis of both hands 04/26/2017  . History of vertebral fracture s/p kyphoplasty  04/26/2017  . Hyperthyroidism 09/28/2013  . Multinodular goiter 09/28/2013  . Rash and nonspecific skin eruption 01/31/2013  . Rash and other nonspecific skin eruption 10/16/2010  . DIAB W/OTH MANIFESTS TYPE  II/UNS TYPE UNCNTRL 04/10/2010  . OSTEOPENIA 08/27/2008  . UNS ADVRS EFF UNS RX MEDICINAL&BIOLOGICAL SBSTNC 09/18/2007  . CARPAL TUNNEL SYNDROME 03/04/2007  . ACTINIC KERATOSIS 03/04/2007  . DEPRESSION 02/13/2007  . HYPERLIPIDEMIA 12/25/2006  . OBESITY 12/25/2006  . HYPERTENSION 12/25/2006    Past Surgical History:  Procedure Laterality Date  . ABDOMINAL HYSTERECTOMY  1996  . CATARACT EXTRACTION, BILATERAL    . [place]  . COLONOSCOPY W/ POLYPECTOMY    . KYPHOPLASTY N/A 06/02/2015   Procedure: KYPHOPLASTY;  Surgeon: Phylliss Bob, MD;  Location: [facility];  Service: Orthopedics;  Laterality: N/A;  Thoracic 3, 8, 10 kyphoplasty     OB History   No obstetric history on file.      Home Medications    Prior to Admission medications   Medication Sig Start Date End Date Taking? Authorizing Provider  albuterol (PROVENTIL) (2.5 MG/3ML) 0.083% nebulizer solution Take 2.5 mg by nebulization every 4 (four) hours as needed for wheezing or shortness of breath.  02/27/17  Yes [provider]  buPROPion (WELLBUTRIN XL) 150 MG 24 hr tablet Take 150 mg by mouth daily.   Yes [provider]  cycloSPORINE (RESTASIS) 0.05 % ophthalmic emulsion Place 1 drop into both eyes 2 (two) times daily.   Yes [provider]  furosemide (LASIX) 20 MG tablet Take 20 mg by  mouth daily.   Yes [provider]  Ibuprofen-diphenhydrAMINE Cit (MOTRIN PM) 200-38 MG TABS Take 1 tablet by mouth at bedtime as needed (sleep).   Yes [provider]  levothyroxine (SYNTHROID, LEVOTHROID) 25 MCG tablet Take 25 mcg by mouth daily before breakfast.   Yes [provider]  metFORMIN (GLUCOPHAGE-XR) 500 MG 24 hr tablet Take 500 mg by mouth every evening.   Yes [provider]  pantoprazole (PROTONIX) 40 MG tablet Take 40 mg by mouth daily.   Yes [provider]  pravastatin (PRAVACHOL) 10 MG tablet Take 10 mg by mouth daily.   Yes  [provider]  sertraline (ZOLOFT) 100 MG tablet Take 150 mg by mouth daily.    Yes [provider]  simethicone (MYLICON) 80 MG chewable tablet Chew 80 mg by mouth every 6 (six) hours as needed for flatulence.   Yes [provider]  valACYclovir (VALTREX) 500 MG tablet Take 1 tablet (500 mg total) by mouth daily. 04/20/13  Yes Ricard Dillon, MD  ALPRAZolam Duanne Moron) 0.5 MG tablet TAKE 1 TABLET THREE TIMES A DAY AS NEEDED FOR ANXIETY Patient not taking: Reported on 10/01/2018 08/18/13   Ricard Dillon, MD  diclofenac sodium (VOLTAREN) 1 % GEL Apply 2 g topically 4 (four) times daily. Patient not taking: Reported on 10/01/2018 05/03/17   Bo Merino, MD  ondansetron (ZOFRAN ODT) 4 MG disintegrating tablet Take 1 tablet (4 mg total) by mouth every 8 (eight) hours as needed for nausea or vomiting. Patient not taking: Reported on 10/01/2018 06/15/18   Rodell Perna A, PA-C  traZODone (DESYREL) 50 MG tablet TAKE 1/2 TO 1 TABLET BY MOUTH AT BEDTIME AS NEEDED. Patient not taking: Reported on 10/01/2018    Ricard Dillon, MD    Family History Family History  Problem Relation Age of Onset  . COPD Mother   . Depression Mother   . Hypertension Mother   . Hyperlipidemia Mother   . Breast cancer Mother 49  . Alzheimer's disease Father   . Heart disease Father   . Breast cancer Sister 13  . Colon cancer Neg Hx     Social History Social History   Tobacco Use  . Smoking status: Former Smoker    Packs/day: 0.50    Years: 4.00    Pack years: 2.00    Types: Cigarettes    Last attempt to quit: 06/28/2016    Years since quitting: 2.2  . Smokeless tobacco: Never Used  Substance Use Topics  . Alcohol use: Yes    Comment: rare glass of wine (holidays only)  . Drug use: No     Allergies   Other   Review of Systems Review of Systems  Constitutional: Positive for chills, diaphoresis and fever.  HENT: Negative for congestion.   Eyes: Negative for visual  disturbance.  Respiratory: Negative for cough and shortness of breath.   Cardiovascular: Negative for chest pain.  Gastrointestinal: Positive for abdominal pain, constipation, diarrhea, nausea and vomiting.  Genitourinary: Positive for flank pain, frequency, hematuria and urgency. Negative for dysuria.  Musculoskeletal: Negative for joint swelling.  Skin: Negative for rash.  Neurological: Negative for headaches.     Physical Exam Updated Vital Signs BP 110/60   Pulse 79   Temp 99.3 F (37.4 C) (Oral)   Resp (!) 21   Ht 5\' 3"  (1.6 m)   Wt 83.9 kg   SpO2 95%   BMI 32.77 kg/m   Physical Exam Vitals signs and  nursing note reviewed.  Constitutional:      General: She is not in acute distress.    Appearance: She is well-developed.  HENT:     Head: Normocephalic and atraumatic.  Eyes:     Conjunctiva/sclera: Conjunctivae normal.  Neck:     Musculoskeletal: Neck supple.  Cardiovascular:     Rate and Rhythm: Normal rate and regular rhythm.     Heart sounds: Normal heart sounds. No murmur.  Pulmonary:     Effort: Pulmonary effort is normal. No respiratory distress.     Breath sounds: Normal breath sounds. No stridor. No wheezing or rhonchi.  Abdominal:     General: Bowel sounds are normal. There is no distension.     Palpations: Abdomen is soft.     Tenderness: There is abdominal tenderness (RUQ, LLQ). There is left CVA tenderness. There is no right CVA tenderness or rebound.     Comments: Guarding to RUQ and LLQ  Skin:    General: Skin is warm and dry.  Neurological:     Mental Status: She is alert.    ED Treatments / Results  Labs (all labs ordered are listed, but only abnormal results are displayed) Labs Reviewed  COMPREHENSIVE METABOLIC PANEL - Abnormal; Notable for the following components:      Result Value   Sodium 134 (*)    Glucose, Bld 134 (*)    Creatinine, Ser 1.09 (*)    Calcium 8.3 (*)    GFR calc non Af Amer 52 (*)    All other components within  normal limits  CBC - Abnormal; Notable for the following components:   WBC 17.9 (*)    Hemoglobin 11.4 (*)    All other components within normal limits  URINALYSIS, ROUTINE W REFLEX MICROSCOPIC - Abnormal; Notable for the following components:   APPearance CLOUDY (*)    Hgb urine dipstick MODERATE (*)    Protein, ur 100 (*)    Nitrite POSITIVE (*)    Leukocytes,Ua LARGE (*)    WBC, UA >50 (*)    Bacteria, UA MANY (*)    All other components within normal limits  URINE CULTURE  LIPASE, BLOOD  LACTIC ACID, PLASMA  LACTIC ACID, PLASMA    EKG None  Radiology Ct Renal Stone Study  Result Date: 09/30/2018 CLINICAL DATA:  Left-sided flank pain EXAM: CT ABDOMEN AND PELVIS WITHOUT CONTRAST TECHNIQUE: Multidetector CT imaging of the abdomen and pelvis was performed following the standard protocol without IV contrast. COMPARISON:  Report 02/01/2000 FINDINGS: Lower chest: Lung bases demonstrate no acute consolidation or effusion. Small pulmonary nodules in the right middle lobe and right lung base measuring up to 5 mm in size. Heart size within normal limits Hepatobiliary: No focal liver abnormality is seen. No gallstones, gallbladder wall thickening, or biliary dilatation. Pancreas: Unremarkable. No pancreatic ductal dilatation or surrounding inflammatory changes. Spleen: Normal in size without focal abnormality. Adrenals/Urinary Tract: Adrenal glands are normal. No hydronephrosis. Asymmetric left perinephric fat stranding. No ureteral stone. Bladder is normal Stomach/Bowel: Stomach is nonenlarged. No dilated small bowel. Diverticular disease of the colon. Focal inflammatory changes adjacent to the distal descending colon with minimal wall thickening Vascular/Lymphatic: Moderate aortic atherosclerosis. No aneurysm. No significantly enlarged lymph nodes. Reproductive: Status post hysterectomy. No adnexal masses. Other: Negative for free air or free fluid. Edema, small fluid and soft tissue stranding  extending along the left retroperitoneum. Musculoskeletal: No acute or significant osseous findings. Treated compression deformities at T8 and T10 IMPRESSION: 1. No hydronephrosis  or ureteral stone. Asymmetric perinephric fat stranding on the left with soft tissue stranding at the left renal pelvis and surrounding left ureter. Findings could be secondary to recently passed stone or ascending urinary tract infection. 2. Additional finding of diffuse diverticular disease of the colon. Focal inflammatory change which appears to be associated with the distal descending colon and may indicate a mild acute diverticulitis. 3. Small pulmonary nodules in the right middle lobe and right base. No follow-up needed if patient is low-risk (and has no known or suspected primary neoplasm). Non-contrast chest CT can be considered in 12 months if patient is high-risk. This recommendation follows the consensus statement: Guidelines for Management of Incidental Pulmonary Nodules Detected on CT Images: From the Fleischner Society 2017; Radiology 2017; 284:228-243. Electronically Signed   By: Donavan Foil M.D.   On: 09/30/2018 23:01    Procedures Procedures (including critical care time)  Medications Ordered in ED Medications  sodium chloride flush (NS) 0.9 % injection 3 mL (3 mLs Intravenous Not Given 10/01/18 0305)  acetaminophen (TYLENOL) tablet 650 mg (0 mg Oral Hold 09/30/18 2330)  ketorolac (TORADOL) 15 MG/ML injection 15 mg (15 mg Intravenous Given 09/30/18 2330)  cefTRIAXone (ROCEPHIN) 1 g in sodium chloride 0.9 % 100 mL IVPB ( Intravenous Stopped 10/01/18 0033)  ondansetron (ZOFRAN) injection 4 mg (4 mg Intravenous Given 09/30/18 2330)  sodium chloride 0.9 % bolus 500 mL (0 mLs Intravenous Stopped 10/01/18 0039)  metroNIDAZOLE (FLAGYL) IVPB 500 mg (0 mg Intravenous Stopped 10/01/18 0319)  morphine 4 MG/ML injection 4 mg (4 mg Intravenous Given 10/01/18 0045)  metoCLOPramide (REGLAN) injection 10 mg (10 mg Intravenous  Given 10/01/18 0045)  morphine 4 MG/ML injection 4 mg (4 mg Intravenous Given 10/01/18 0729)     Initial Impression / Assessment and Plan / ED Course  I have reviewed the triage vital signs and the nursing notes.  Pertinent labs & imaging results that were available during my care of the patient were reviewed by me and considered in my medical decision making (see chart for details).      Final Clinical Impressions(s) / ED Diagnoses   Final diagnoses:  Pyelonephritis  Diverticulitis   Patient presenting for evaluation of flank pain, abdominal pain and urinary symptoms.  Also having fevers at home.  In the ED noted to have temp of 99.3, otherwise vital signs have been within normal limits.  Patient refused rectal temp.  She does have right upper quadrant, left lower quadrant and left CVA tenderness on exam.  CBC with white blood cell count of 17.9, hemoglobin slightly lower than previous at 11.4.  Patient denying bloody stools.  CMP with mild hyponatremia, mild elevated creatinine at 1.09, does appear consistent with her most recent labs.  Normal liver function.  Lipase is negative.  UA shows leukocytes, nitrites, hematuria, proteinuria, 11-20 RBCs, greater than 50 white blood cells and many bacteria consistent with UTI.  Urine culture was sent.  Patient given dose of ceftriaxone in the ED for suspected pyelonephritis after initial evaluation.  Lactic acid is negative.  CT of the abdomen and pelvis was ordered to rule out possible infected kidney stone versus other pathology.  There is no evidence of stone on CT scan however does show changes of either recently passed stone or findings that could be suggestive of pyelonephritis.  She also has mild diverticulitis.  At this time a dose of Flagyl was added to cover her for diverticulitis as well.  Patient has remained stable in the  ED throughout her visit.  She has had no episodes of vomiting and does feel somewhat improved after antiemetics  and pain medications.  We will plan for admission for further treatment of pyelonephritis and diverticulitis.  Case discussed with Dr. Alcario Drought who will discuss case with oncoming hospitalist team.  ED Discharge Orders    None       Rodney Booze, PA-C 10/01/18 0801    Fatima Blank, MD 10/02/18 [phone]

## 2018-09-30 NOTE — ED Notes (Signed)
Pt refused rectal temp.

## 2018-09-30 NOTE — ED Triage Notes (Signed)
Patient c/o left flank pain x1 month. Reports passing stone on Saturday. C/o N/V since Saturday.

## 2018-10-01 ENCOUNTER — Encounter (HOSPITAL_COMMUNITY): Payer: Self-pay

## 2018-10-01 ENCOUNTER — Other Ambulatory Visit: Payer: Self-pay

## 2018-10-01 DIAGNOSIS — E039 Hypothyroidism, unspecified: Secondary | ICD-10-CM | POA: Diagnosis present

## 2018-10-01 DIAGNOSIS — E669 Obesity, unspecified: Secondary | ICD-10-CM | POA: Diagnosis not present

## 2018-10-01 DIAGNOSIS — Z87442 Personal history of urinary calculi: Secondary | ICD-10-CM | POA: Diagnosis not present

## 2018-10-01 DIAGNOSIS — I10 Essential (primary) hypertension: Secondary | ICD-10-CM | POA: Diagnosis not present

## 2018-10-01 DIAGNOSIS — E119 Type 2 diabetes mellitus without complications: Secondary | ICD-10-CM | POA: Diagnosis not present

## 2018-10-01 DIAGNOSIS — Z8249 Family history of ischemic heart disease and other diseases of the circulatory system: Secondary | ICD-10-CM | POA: Diagnosis not present

## 2018-10-01 DIAGNOSIS — H269 Unspecified cataract: Secondary | ICD-10-CM | POA: Diagnosis present

## 2018-10-01 DIAGNOSIS — K5792 Diverticulitis of intestine, part unspecified, without perforation or abscess without bleeding: Secondary | ICD-10-CM | POA: Diagnosis present

## 2018-10-01 DIAGNOSIS — Z818 Family history of other mental and behavioral disorders: Secondary | ICD-10-CM | POA: Diagnosis not present

## 2018-10-01 DIAGNOSIS — R69 Illness, unspecified: Secondary | ICD-10-CM | POA: Diagnosis not present

## 2018-10-01 DIAGNOSIS — Z8349 Family history of other endocrine, nutritional and metabolic diseases: Secondary | ICD-10-CM | POA: Diagnosis not present

## 2018-10-01 DIAGNOSIS — Z825 Family history of asthma and other chronic lower respiratory diseases: Secondary | ICD-10-CM | POA: Diagnosis not present

## 2018-10-01 DIAGNOSIS — R918 Other nonspecific abnormal finding of lung field: Secondary | ICD-10-CM

## 2018-10-01 DIAGNOSIS — K5900 Constipation, unspecified: Secondary | ICD-10-CM | POA: Diagnosis present

## 2018-10-01 DIAGNOSIS — K5732 Diverticulitis of large intestine without perforation or abscess without bleeding: Secondary | ICD-10-CM | POA: Diagnosis not present

## 2018-10-01 DIAGNOSIS — E785 Hyperlipidemia, unspecified: Secondary | ICD-10-CM | POA: Diagnosis not present

## 2018-10-01 DIAGNOSIS — F329 Major depressive disorder, single episode, unspecified: Secondary | ICD-10-CM | POA: Diagnosis present

## 2018-10-01 DIAGNOSIS — F4310 Post-traumatic stress disorder, unspecified: Secondary | ICD-10-CM | POA: Diagnosis present

## 2018-10-01 DIAGNOSIS — Z79890 Hormone replacement therapy (postmenopausal): Secondary | ICD-10-CM | POA: Diagnosis not present

## 2018-10-01 DIAGNOSIS — Z820 Family history of epilepsy and other diseases of the nervous system: Secondary | ICD-10-CM | POA: Diagnosis not present

## 2018-10-01 DIAGNOSIS — N200 Calculus of kidney: Secondary | ICD-10-CM | POA: Diagnosis not present

## 2018-10-01 DIAGNOSIS — N10 Acute tubulo-interstitial nephritis: Secondary | ICD-10-CM | POA: Diagnosis not present

## 2018-10-01 DIAGNOSIS — Z791 Long term (current) use of non-steroidal anti-inflammatories (NSAID): Secondary | ICD-10-CM | POA: Diagnosis not present

## 2018-10-01 DIAGNOSIS — K219 Gastro-esophageal reflux disease without esophagitis: Secondary | ICD-10-CM | POA: Diagnosis not present

## 2018-10-01 DIAGNOSIS — Z803 Family history of malignant neoplasm of breast: Secondary | ICD-10-CM | POA: Diagnosis not present

## 2018-10-01 DIAGNOSIS — Z87891 Personal history of nicotine dependence: Secondary | ICD-10-CM | POA: Diagnosis not present

## 2018-10-01 LAB — LACTIC ACID, PLASMA
LACTIC ACID, VENOUS: 0.8 mmol/L (ref 0.5–1.9)
Lactic Acid, Venous: 0.6 mmol/L (ref 0.5–1.9)

## 2018-10-01 LAB — GLUCOSE, CAPILLARY
Glucose-Capillary: 174 mg/dL — ABNORMAL HIGH (ref 70–99)
Glucose-Capillary: 84 mg/dL (ref 70–99)

## 2018-10-01 MED ORDER — METOCLOPRAMIDE HCL 5 MG/ML IJ SOLN
10.0000 mg | Freq: Once | INTRAMUSCULAR | Status: AC
  Administered 2018-10-01: 10 mg via INTRAVENOUS
  Filled 2018-10-01: qty 2

## 2018-10-01 MED ORDER — MORPHINE SULFATE (PF) 4 MG/ML IV SOLN
4.0000 mg | Freq: Once | INTRAVENOUS | Status: AC
  Administered 2018-10-01: 4 mg via INTRAVENOUS
  Filled 2018-10-01: qty 1

## 2018-10-01 MED ORDER — LEVOTHYROXINE SODIUM 25 MCG PO TABS
25.0000 ug | ORAL_TABLET | Freq: Every day | ORAL | Status: DC
  Administered 2018-10-02 – 2018-10-04 (×3): 25 ug via ORAL
  Filled 2018-10-01 (×4): qty 1

## 2018-10-01 MED ORDER — SODIUM CHLORIDE 0.9 % IV SOLN
INTRAVENOUS | Status: AC
  Administered 2018-10-01 – 2018-10-02 (×2): via INTRAVENOUS

## 2018-10-01 MED ORDER — METRONIDAZOLE IN NACL 5-0.79 MG/ML-% IV SOLN
500.0000 mg | Freq: Three times a day (TID) | INTRAVENOUS | Status: DC
  Administered 2018-10-01 – 2018-10-04 (×10): 500 mg via INTRAVENOUS
  Filled 2018-10-01 (×10): qty 100

## 2018-10-01 MED ORDER — BUPROPION HCL ER (XL) 150 MG PO TB24
150.0000 mg | ORAL_TABLET | Freq: Every day | ORAL | Status: DC
  Administered 2018-10-01 – 2018-10-04 (×4): 150 mg via ORAL
  Filled 2018-10-01 (×4): qty 1

## 2018-10-01 MED ORDER — ACETAMINOPHEN 325 MG PO TABS
650.0000 mg | ORAL_TABLET | Freq: Four times a day (QID) | ORAL | Status: DC | PRN
  Administered 2018-10-01: 650 mg via ORAL
  Filled 2018-10-01 (×2): qty 2

## 2018-10-01 MED ORDER — SODIUM CHLORIDE 0.9 % IV SOLN
2.0000 g | INTRAVENOUS | Status: DC
  Administered 2018-10-01 – 2018-10-03 (×3): 2 g via INTRAVENOUS
  Filled 2018-10-01: qty 20
  Filled 2018-10-01 (×3): qty 2

## 2018-10-01 MED ORDER — ACETAMINOPHEN 650 MG RE SUPP: 650.0000 mg | Freq: Four times a day (QID) | RECTAL | Status: DC | PRN

## 2018-10-01 MED ORDER — OXYCODONE HCL 5 MG PO TABS
5.0000 mg | ORAL_TABLET | ORAL | Status: DC | PRN
  Administered 2018-10-01 – 2018-10-02 (×4): 5 mg via ORAL
  Filled 2018-10-01 (×4): qty 1

## 2018-10-01 MED ORDER — SERTRALINE HCL 50 MG PO TABS
150.0000 mg | ORAL_TABLET | Freq: Every day | ORAL | Status: DC
  Administered 2018-10-01 – 2018-10-04 (×4): 150 mg via ORAL
  Filled 2018-10-01 (×4): qty 1

## 2018-10-01 MED ORDER — ONDANSETRON HCL 4 MG PO TABS: 4.0000 mg | ORAL_TABLET | Freq: Four times a day (QID) | ORAL | Status: DC | PRN

## 2018-10-01 MED ORDER — PRAVASTATIN SODIUM 20 MG PO TABS
10.0000 mg | ORAL_TABLET | Freq: Every day | ORAL | Status: DC
  Administered 2018-10-01 – 2018-10-03 (×3): 10 mg via ORAL
  Filled 2018-10-01 (×3): qty 1

## 2018-10-01 MED ORDER — MORPHINE SULFATE (PF) 2 MG/ML IV SOLN: 2.0000 mg | INTRAVENOUS | Status: DC | PRN

## 2018-10-01 MED ORDER — SIMETHICONE 40 MG/0.6ML PO SUSP
40.0000 mg | Freq: Four times a day (QID) | ORAL | Status: DC | PRN
  Administered 2018-10-01: 40 mg via ORAL
  Filled 2018-10-01 (×3): qty 0.6

## 2018-10-01 MED ORDER — CYCLOSPORINE 0.05 % OP EMUL
1.0000 [drp] | Freq: Two times a day (BID) | OPHTHALMIC | Status: DC
  Administered 2018-10-01 (×2): 1 [drp] via OPHTHALMIC
  Administered 2018-10-02: 240 [drp] via OPHTHALMIC
  Administered 2018-10-02 – 2018-10-04 (×4): 1 [drp] via OPHTHALMIC
  Filled 2018-10-01 (×7): qty 30

## 2018-10-01 MED ORDER — ALBUTEROL SULFATE (2.5 MG/3ML) 0.083% IN NEBU: 2.5000 mg | INHALATION_SOLUTION | RESPIRATORY_TRACT | Status: DC | PRN

## 2018-10-01 MED ORDER — ENOXAPARIN SODIUM 40 MG/0.4ML ~~LOC~~ SOLN
40.0000 mg | SUBCUTANEOUS | Status: DC
  Administered 2018-10-01 – 2018-10-04 (×4): 40 mg via SUBCUTANEOUS
  Filled 2018-10-01 (×4): qty 0.4

## 2018-10-01 MED ORDER — VALACYCLOVIR HCL 500 MG PO TABS
500.0000 mg | ORAL_TABLET | Freq: Every day | ORAL | Status: DC
  Administered 2018-10-01 – 2018-10-04 (×4): 500 mg via ORAL
  Filled 2018-10-01 (×4): qty 1

## 2018-10-01 MED ORDER — INSULIN ASPART 100 UNIT/ML ~~LOC~~ SOLN
0.0000 [IU] | Freq: Three times a day (TID) | SUBCUTANEOUS | Status: DC
  Administered 2018-10-01: 3 [IU] via SUBCUTANEOUS

## 2018-10-01 MED ORDER — ONDANSETRON HCL 4 MG/2ML IJ SOLN
4.0000 mg | Freq: Four times a day (QID) | INTRAMUSCULAR | Status: DC | PRN
  Administered 2018-10-03: 4 mg via INTRAVENOUS
  Filled 2018-10-01: qty 2

## 2018-10-01 MED ORDER — PANTOPRAZOLE SODIUM 40 MG PO TBEC
40.0000 mg | DELAYED_RELEASE_TABLET | Freq: Every day | ORAL | Status: DC
  Administered 2018-10-01 – 2018-10-04 (×4): 40 mg via ORAL
  Filled 2018-10-01 (×4): qty 1

## 2018-10-01 NOTE — ED Notes (Signed)
ED TO INPATIENT HANDOFF REPORT  Name/Age/Gender Jenny Cardenas 68 y.o. female  Code Status Advance Directive Documentation     Most Recent Value  Type of Advance Directive  Healthcare Power of Attorney, Living will  Pre-existing out of facility DNR order (yellow form or pink MOST form)  -  "MOST" Form in Place?  -      Home/SNF/Other Home  Chief Complaint Emesis  Level of Care/Admitting Diagnosis ED Disposition    ED Disposition Condition [facility]: [facility] [[id number]]  Level of Care: Med-Surg [16]  Diagnosis: Acute pyelonephritis [235573]  Admitting Physician: Bonnielee Haff [[id number]]  Attending Physician: Bonnielee Haff [[id number]]  Estimated length of stay: past midnight tomorrow  Certification:: I certify this patient will need inpatient services for at least 2 midnights  PT Class (Do Not Modify): Inpatient [101]  PT Acc Code (Do Not Modify): Private [1]       Medical History Past Medical History:  Diagnosis Date  . Anxiety   . Arthritis   . Carpal tunnel syndrome   . Cataract   . Depression   . Diabetes mellitus without complication (HCC)    Type 2  . Fall April 10, 2015  . GERD (gastroesophageal reflux disease)   . Goiter   . History of shingles    On abdomen, left ring finger, and left leg; Pt takes Valtrex  . Hyperlipidemia   . Hypertension   . Obesity   . PTSD (post-traumatic stress disorder)   . Shortness of breath dyspnea    pt stated its related to the back problems she has  . Thyroid disease   . Ulcer     Allergies Allergies  Allergen Reactions  . Other     Etholine Oxide: causes itching and hives    IV Location/Drains/Wounds Patient Lines/Drains/Airways Status   Active Line/Drains/Airways    Name:   Placement date:   Placement time:   Site:   Days:   Peripheral IV 09/30/18 Left Antecubital   09/30/18    2329    Antecubital   1          Labs/Imaging Results for orders placed or performed  during the hospital encounter of 09/30/18 (from the past 48 hour(s))  Lipase, blood     Status: None   Collection Time: 09/30/18  5:15 PM  Result Value Ref Range   Lipase 28 11 - 51 U/L    Comment: Performed at [facility], [place] [address], [place], [place] [address]  Comprehensive metabolic panel     Status: Abnormal   Collection Time: 09/30/18  5:15 PM  Result Value Ref Range   Sodium 134 (L) 135 - 145 mmol/L   Potassium 3.7 3.5 - 5.1 mmol/L   Chloride 99 98 - 111 mmol/L   CO2 26 22 - 32 mmol/L   Glucose, Bld 134 (H) 70 - 99 mg/dL   BUN 16 8 - 23 mg/dL   Creatinine, Ser 1.09 (H) 0.44 - 1.00 mg/dL   Calcium 8.3 (L) 8.9 - 10.3 mg/dL   Total Protein 7.3 6.5 - 8.1 g/dL   Albumin 3.5 3.5 - 5.0 g/dL   AST 16 15 - 41 U/L   ALT 11 0 - 44 U/L   Alkaline Phosphatase 93 38 - 126 U/L   Total Bilirubin 0.8 0.3 - 1.2 mg/dL   GFR calc non Af Amer 52 (L) >60 mL/min   GFR calc Af Amer >60 >60  mL/min   Anion gap 9 5 - 15    Comment: Performed at [facility], [place] [address], [place], [place] [address]  CBC     Status: Abnormal   Collection Time: 09/30/18  5:15 PM  Result Value Ref Range   WBC 17.9 (H) 4.0 - 10.5 K/uL   RBC 4.11 3.87 - 5.11 MIL/uL   Hemoglobin 11.4 (L) 12.0 - 15.0 g/dL   HCT 36.4 36.0 - 46.0 %   MCV 88.6 80.0 - 100.0 fL   MCH 27.7 26.0 - 34.0 pg   MCHC 31.3 30.0 - 36.0 g/dL   RDW 13.2 11.5 - 15.5 %   Platelets 310 150 - 400 K/uL   nRBC 0.0 0.0 - 0.2 %    Comment: Performed at [facility], [place] [address], [place], [place] [address]  Urinalysis, Routine w reflex microscopic     Status: Abnormal   Collection Time: 09/30/18  5:16 PM  Result Value Ref Range   Color, Urine YELLOW YELLOW   APPearance CLOUDY (A) CLEAR   Specific Gravity, Urine 1.017 1.005 - 1.030   pH 5.0 5.0 - 8.0   Glucose, UA NEGATIVE NEGATIVE mg/dL   Hgb urine dipstick MODERATE (A) NEGATIVE   Bilirubin Urine NEGATIVE NEGATIVE   Ketones, ur  NEGATIVE NEGATIVE mg/dL   Protein, ur 100 (A) NEGATIVE mg/dL   Nitrite POSITIVE (A) NEGATIVE   Leukocytes,Ua LARGE (A) NEGATIVE   RBC / HPF 11-20 0 - 5 RBC/hpf   WBC, UA >50 (H) 0 - 5 WBC/hpf   Bacteria, UA MANY (A) NONE SEEN   Squamous Epithelial / LPF 0-5 0 - 5   WBC Clumps PRESENT    Mucus PRESENT     Comment: Performed at [facility], [place] [address], [place], [place] [address]  Lactic acid, plasma     Status: None   Collection Time: 09/30/18 11:44 PM  Result Value Ref Range   Lactic Acid, Venous 0.8 0.5 - 1.9 mmol/L    Comment: Performed at [facility], [place] [address] [place], [place] [address]  Lactic acid, plasma     Status: None   Collection Time: 10/01/18  3:20 AM  Result Value Ref Range   Lactic Acid, Venous 0.6 0.5 - 1.9 mmol/L    Comment: Performed at [facility], [place] [address] [place], [place] [address]   Ct Renal Stone Study  Result Date: 09/30/2018 CLINICAL DATA:  Left-sided flank pain EXAM: CT ABDOMEN AND PELVIS WITHOUT CONTRAST TECHNIQUE: Multidetector CT imaging of the abdomen and pelvis was performed following the standard protocol without IV contrast. COMPARISON:  Report 02/01/2000 FINDINGS: Lower chest: Lung bases demonstrate no acute consolidation or effusion. Small pulmonary nodules in the right middle lobe and right lung base measuring up to 5 mm in size. Heart size within normal limits Hepatobiliary: No focal liver abnormality is seen. No gallstones, gallbladder wall thickening, or biliary dilatation. Pancreas: Unremarkable. No pancreatic ductal dilatation or surrounding inflammatory changes. Spleen: Normal in size without focal abnormality. Adrenals/Urinary Tract: Adrenal glands are normal. No hydronephrosis. Asymmetric left perinephric fat stranding. No ureteral stone. Bladder is normal Stomach/Bowel: Stomach is nonenlarged. No dilated small bowel. Diverticular disease of the colon. Focal inflammatory  changes adjacent to the distal descending colon with minimal wall thickening Vascular/Lymphatic: Moderate aortic atherosclerosis. No aneurysm. No significantly enlarged lymph nodes. Reproductive: Status post hysterectomy. No adnexal masses. Other: Negative for free air or free fluid. Edema, small fluid  and soft tissue stranding extending along the left retroperitoneum. Musculoskeletal: No acute or significant osseous findings. Treated compression deformities at T8 and T10 IMPRESSION: 1. No hydronephrosis or ureteral stone. Asymmetric perinephric fat stranding on the left with soft tissue stranding at the left renal pelvis and surrounding left ureter. Findings could be secondary to recently passed stone or ascending urinary tract infection. 2. Additional finding of diffuse diverticular disease of the colon. Focal inflammatory change which appears to be associated with the distal descending colon and may indicate a mild acute diverticulitis. 3. Small pulmonary nodules in the right middle lobe and right base. No follow-up needed if patient is low-risk (and has no known or suspected primary neoplasm). Non-contrast chest CT can be considered in 12 months if patient is high-risk. This recommendation follows the consensus statement: Guidelines for Management of Incidental Pulmonary Nodules Detected on CT Images: From the Fleischner Society 2017; Radiology 2017; 284:228-243. Electronically Signed   By: Donavan Foil M.D.   On: 09/30/2018 23:01   None  Pending Labs Unresulted Labs (From admission, onward)    Start     Ordered   09/30/18 2218  Urine culture  ONCE - STAT,   STAT     09/30/18 2218   Signed and Held  HIV antibody (Routine Testing)  Tomorrow morning,   R     Signed and Held   Signed and Held  Basic metabolic panel  Tomorrow morning,   R     Signed and Held   Signed and Held  Hemoglobin A1c  Tomorrow morning,   R     Signed and Held   Signed and Held  Comprehensive metabolic panel  Tomorrow morning,    R     Signed and Held          Vitals/Pain Today's Vitals   09/30/18 2200 09/30/18 2335 10/01/18 0400 10/01/18 0800  BP: (!) 118/59 (!) 125/53 110/60 101/76  Pulse: 85 83 79 86  Resp: 19 14 (!) 21 18  Temp:      TempSrc:      SpO2: 94% 94% 95% 91%  Weight:      Height:      PainSc:        Isolation Precautions No active isolations  Medications Medications  sodium chloride flush (NS) 0.9 % injection 3 mL (3 mLs Intravenous Not Given 10/01/18 0305)  acetaminophen (TYLENOL) tablet 650 mg (0 mg Oral Hold 09/30/18 2330)  metroNIDAZOLE (FLAGYL) IVPB 500 mg (has no administration in time range)  morphine 2 MG/ML injection 2 mg (has no administration in time range)  0.9 %  sodium chloride infusion (has no administration in time range)  oxyCODONE (Oxy IR/ROXICODONE) immediate release tablet 5 mg (has no administration in time range)  insulin aspart (novoLOG) injection 0-15 Units (has no administration in time range)  ketorolac (TORADOL) 15 MG/ML injection 15 mg (15 mg Intravenous Given 09/30/18 2330)  cefTRIAXone (ROCEPHIN) 1 g in sodium chloride 0.9 % 100 mL IVPB ( Intravenous Stopped 10/01/18 0033)  ondansetron (ZOFRAN) injection 4 mg (4 mg Intravenous Given 09/30/18 2330)  sodium chloride 0.9 % bolus 500 mL (0 mLs Intravenous Stopped 10/01/18 0039)  metroNIDAZOLE (FLAGYL) IVPB 500 mg (0 mg Intravenous Stopped 10/01/18 0319)  morphine 4 MG/ML injection 4 mg (4 mg Intravenous Given 10/01/18 0045)  metoCLOPramide (REGLAN) injection 10 mg (10 mg Intravenous Given 10/01/18 0045)  morphine 4 MG/ML injection 4 mg (4 mg Intravenous Given 10/01/18 0729)    Mobility walks

## 2018-10-01 NOTE — H&P (Addendum)
[facility] History and Physical  Jenny Cardenas AUQ:[id number] DOB: Dec 09, 1950 DOA: 09/30/2018   PCP: Maurice Small, MD  Specialists: Dr. Henrene Pastor is her gastroenterologist with last colonoscopy in 2015  Chief Complaint: Left-sided flank pain, dysuria  HPI: Jenny Cardenas is a 68 y.o. female with a past medical history of diabetes mellitus type 2, hypothyroidism, lower extremity edema, history of diverticulosis and polyps based on colonoscopy done in 2015, history of kidney stones who has been having left-sided flank pain for a few weeks now.  4 days ago patient apparently passed a stone.  She had a lot of blood in the urine at that time.  She does not follow with a urologist.  Subsequent to that she continued to have frequency and urgency.  She also had symptoms for dysuria.  She continued to have this pain so she decided to come to the hospital for further evaluation.  Pain is 6 out of 10 in intensity.  Sharp pain.  No radiation.  Associated with nausea and vomiting.  She has had some loose stools previously but none in the last 24 hours.  Denies any blood in the stool.  Denies any blood in the urine currently.  She thinks she had fever at home but is not certain.  In the emergency department patient underwent a CT scan which raise concern for pyelonephritis as well as diverticulitis.  She was hospitalized for further management.  Home Medications: Prior to Admission medications   Medication Sig Start Date End Date Taking? Authorizing Provider  albuterol (PROVENTIL) (2.5 MG/3ML) 0.083% nebulizer solution Take 2.5 mg by nebulization every 4 (four) hours as needed for wheezing or shortness of breath.  02/27/17  Yes [provider]  buPROPion (WELLBUTRIN XL) 150 MG 24 hr tablet Take 150 mg by mouth daily.   Yes [provider]  cycloSPORINE (RESTASIS) 0.05 % ophthalmic emulsion Place 1 drop into both eyes 2 (two) times daily.   Yes [provider]  furosemide (LASIX) 20  MG tablet Take 20 mg by mouth daily.   Yes [provider]  Ibuprofen-diphenhydrAMINE Cit (MOTRIN PM) 200-38 MG TABS Take 1 tablet by mouth at bedtime as needed (sleep).   Yes [provider]  levothyroxine (SYNTHROID, LEVOTHROID) 25 MCG tablet Take 25 mcg by mouth daily before breakfast.   Yes [provider]  metFORMIN (GLUCOPHAGE-XR) 500 MG 24 hr tablet Take 500 mg by mouth every evening.   Yes [provider]  pantoprazole (PROTONIX) 40 MG tablet Take 40 mg by mouth daily.   Yes [provider]  pravastatin (PRAVACHOL) 10 MG tablet Take 10 mg by mouth daily.   Yes [provider]  sertraline (ZOLOFT) 100 MG tablet Take 150 mg by mouth daily.    Yes [provider]  simethicone (MYLICON) 80 MG chewable tablet Chew 80 mg by mouth every 6 (six) hours as needed for flatulence.   Yes [provider]  valACYclovir (VALTREX) 500 MG tablet Take 1 tablet (500 mg total) by mouth daily. 04/20/13  Yes Ricard Dillon, MD    Allergies:  Allergies  Allergen Reactions  . Other     Etholine Oxide: causes itching and hives    Past Medical History: Past Medical History:  Diagnosis Date  . Anxiety   . Arthritis   . Carpal tunnel syndrome   . Cataract   . Depression   . Diabetes mellitus without complication (HCC)    Type 2  . Fall April 10, 2015  . GERD (gastroesophageal reflux disease)   . Goiter   . History of shingles    On abdomen, left ring finger, and left leg; Pt takes Valtrex  . Hyperlipidemia   . Hypertension   . Obesity   . PTSD (post-traumatic stress disorder)   . Shortness of breath dyspnea    pt stated its related to the back problems she has  . Thyroid disease   . Ulcer     Past Surgical History:  Procedure Laterality Date  . ABDOMINAL HYSTERECTOMY  1996  . CATARACT EXTRACTION, BILATERAL    . [place]  . COLONOSCOPY W/ POLYPECTOMY    . KYPHOPLASTY N/A 06/02/2015    Procedure: KYPHOPLASTY;  Surgeon: Phylliss Bob, MD;  Location: [facility];  Service: Orthopedics;  Laterality: N/A;  Thoracic 3, 8, 10 kyphoplasty    Social History: She lives here in town.  Her mother lives with her.  She quit smoking 3 years ago.  No alcohol use on a daily basis.  No illicit drug use.  Independent with daily activities.   Family History:  Family History  Problem Relation Age of Onset  . COPD Mother   . Depression Mother   . Hypertension Mother   . Hyperlipidemia Mother   . Breast cancer Mother 56  . Alzheimer's disease Father   . Heart disease Father   . Breast cancer Sister 37  . Colon cancer Neg Hx      Review of Systems - History obtained from the patient General ROS: positive for  - fatigue Psychological ROS: negative Ophthalmic ROS: negative ENT ROS: negative Allergy and Immunology ROS: negative Hematological and Lymphatic ROS: negative Endocrine ROS: negative Respiratory ROS: no cough, shortness of breath, or wheezing Cardiovascular ROS: no chest pain or dyspnea on exertion Gastrointestinal ROS: as in hpi Genito-Urinary ROS: as in hpi Musculoskeletal ROS: negative Neurological ROS: no TIA or stroke symptoms Dermatological ROS: negative  Physical Examination  Vitals:   09/30/18 2335 10/01/18 0400 10/01/18 0800 10/01/18 0906  BP: (!) 125/53 110/60 101/76 133/68  Pulse: 83 79 86 87  Resp: 14 (!) 21 18   Temp:    99.4 F (37.4 C)  TempSrc:      SpO2: 94% 95% 91% 92%  Weight:      Height:        BP 133/68 (BP Location: Right Arm)   Pulse 87   Temp 99.4 F (37.4 C)   Resp 18   Ht 5\' 3"  (1.6 m)   Wt 83.9 kg   SpO2 92%   BMI 32.77 kg/m   General appearance: alert, cooperative, appears stated age and no distress Head: Normocephalic, without obvious abnormality, atraumatic Eyes: conjunctivae/corneas clear. PERRL, EOM's intact.  Throat: lips, mucosa, and tongue normal; teeth and gums normal Neck: no adenopathy, no carotid bruit, no JVD,  supple, symmetrical, trachea midline and thyroid not enlarged, symmetric, no tenderness/mass/nodules Back: Left CVA tenderness Resp: clear to auscultation bilaterally Cardio: regular rate and rhythm, S1, S2 normal, no murmur, click, rub or gallop GI: Diminished soft.  Tender in the right upper quadrant which he says is chronic.  No rebound rigidity or guarding.  No masses organomegaly.  Bowel sounds present. Extremities: extremities normal, atraumatic, no cyanosis or edema Pulses: 2+ and symmetric Skin: Skin color, texture, turgor normal. No rashes or lesions Neurologic: Grossly normal    Labs on Admission: I have personally reviewed following labs and imaging studies  CBC: Recent Labs  Lab 09/30/18 1715  WBC 17.9*  HGB 11.4*  HCT 36.4  MCV 88.6  PLT 016   Basic Metabolic Panel: Recent Labs  Lab 09/30/18 1715  NA 134*  K 3.7  CL 99  CO2 26  GLUCOSE 134*  BUN 16  CREATININE 1.09*  CALCIUM 8.3*   GFR: Estimated Creatinine Clearance: 50.7 mL/min (A) (by C-G formula based on SCr of 1.09 mg/dL (H)). Liver Function Tests: Recent Labs  Lab 09/30/18 1715  AST 16  ALT 11  ALKPHOS 93  BILITOT 0.8  PROT 7.3  ALBUMIN 3.5   Recent Labs  Lab 09/30/18 1715  LIPASE 28     Radiological Exams on Admission: Ct Renal Stone Study  Result Date: 09/30/2018 CLINICAL DATA:  Left-sided flank pain EXAM: CT ABDOMEN AND PELVIS WITHOUT CONTRAST TECHNIQUE: Multidetector CT imaging of the abdomen and pelvis was performed following the standard protocol without IV contrast. COMPARISON:  Report 02/01/2000 FINDINGS: Lower chest: Lung bases demonstrate no acute consolidation or effusion. Small pulmonary nodules in the right middle lobe and right lung base measuring up to 5 mm in size. Heart size within normal limits Hepatobiliary: No focal liver abnormality is seen. No gallstones, gallbladder wall thickening, or biliary dilatation. Pancreas: Unremarkable. No pancreatic ductal dilatation or  surrounding inflammatory changes. Spleen: Normal in size without focal abnormality. Adrenals/Urinary Tract: Adrenal glands are normal. No hydronephrosis. Asymmetric left perinephric fat stranding. No ureteral stone. Bladder is normal Stomach/Bowel: Stomach is nonenlarged. No dilated small bowel. Diverticular disease of the colon. Focal inflammatory changes adjacent to the distal descending colon with minimal wall thickening Vascular/Lymphatic: Moderate aortic atherosclerosis. No aneurysm. No significantly enlarged lymph nodes. Reproductive: Status post hysterectomy. No adnexal masses. Other: Negative for free air or free fluid. Edema, small fluid and soft tissue stranding extending along the left retroperitoneum. Musculoskeletal: No acute or significant osseous findings. Treated compression deformities at T8 and T10 IMPRESSION: 1. No hydronephrosis or ureteral stone. Asymmetric perinephric fat stranding on the left with soft tissue stranding at the left renal pelvis and surrounding left ureter. Findings could be secondary to recently passed stone or ascending urinary tract infection. 2. Additional finding of diffuse diverticular disease of the colon. Focal inflammatory change which appears to be associated with the distal descending colon and may indicate a mild acute diverticulitis. 3. Small pulmonary nodules in the right middle lobe and right base. No follow-up needed if patient is low-risk (and has no known or suspected primary neoplasm). Non-contrast chest CT can be considered in 12 months if patient is high-risk. This recommendation follows the consensus statement: Guidelines for Management of Incidental Pulmonary Nodules Detected on CT Images: From the Fleischner Society 2017; Radiology 2017; 284:228-243. Electronically Signed   By: Donavan Foil M.D.   On: 09/30/2018 23:01      Problem List  Principal Problem:   Acute pyelonephritis Active Problems:   Acute diverticulitis   Assessment: This is a  67 year old Caucasian female with a past medical history as stated earlier who comes in with abdominal pain.  She mentions that she passed a kidney stone a few days ago.  Her UA is abnormal.  Her CT scan suggested pyelonephritis.  There was also concern for diverticulitis.  Plan:  1. Acute pyelonephritis: Apparently passed a kidney stone recently.  UA is noted to be abnormal.  Her WBC is elevated.  No clear evidence for sepsis currently.  Lactic acid level was normal.  She will be continued on ceftriaxone for now.  Follow-up on urine  cultures.  Patient will benefit from outpatient evaluation by urology for her history of kidney stones.  No stone identified on CT study.  2.  Concern for acute diverticulitis: CT scan also raise concern for focal inflammatory change in the distal descending colon.  Patient does have severe diverticulosis based on colonoscopy done in 2015.  However she is not really tender in that left lower quadrant.  Continue with Flagyl for now.  Ceftriaxone will be continued as discussed above.  Clear liquid diet for now.  Advance as tolerated.  Patient is also tender in the right upper quadrant.  No gallstones noted on CT scan.  LFTs normal.  Patient mentions that this is chronic and has been ongoing for 2 years.  Her PCP is aware.  3.  Pulmonary nodules: Detected incidentally on CT scan.  She is a former smoker.  She will need continued outpatient surveillance.  4.  Diabetes mellitus type 2: On metformin at home which will be held.  Monitor CBGs.  SSI.  HbA1c will be checked in the morning.  5. Normocytic anemia: Monitor hemoglobin.  No evidence of overt bleeding currently.  She did have some hematuria when she passed kidney stone few days ago.  6. History of hypothyroidism: Continue home medications.  7.  History of anxiety/depression: Continue with home medications.   DVT Prophylaxis: Lovenox Code Status: Full code Family Communication: Discussed with the patient.  No other  family member at bedside Disposition: Hopefully return home when medically improved Consults called: None Admission Status: Inpatient  Severity of Illness: The appropriate patient status for this patient is INPATIENT. Inpatient status is judged to be reasonable and necessary in order to provide the required intensity of service to ensure the patient's safety. The patient's presenting symptoms, physical exam findings, and initial radiographic and laboratory data in the context of their chronic comorbidities is felt to place them at high risk for further clinical deterioration. Furthermore, it is not anticipated that the patient will be medically stable for discharge from the hospital within 2 midnights of admission. The following factors support the patient status of inpatient.   " The patient's presenting symptoms include abdominal pain, nausea and vomiting. " The worrisome physical exam findings include left CVA tenderness. " The initial radiographic and laboratory data are worrisome because of acute pyonephritis with concern for acute diverticulitis. " The chronic co-morbidities include diabetes mellitus type 2.   * I certify that at the point of admission it is my clinical judgment that the patient will require inpatient hospital care spanning beyond 2 midnights from the point of admission due to high intensity of service, high risk for further deterioration and high frequency of surveillance required.*    Further management decisions will depend on results of further testing and patient's response to treatment.   Saloma Cadena [employer]  [facility] operational officer on [email or web address]  10/01/2018, 9:38 AM

## 2018-10-01 NOTE — Progress Notes (Addendum)
Patient was transferred from ED to 5 E at 0906. RN got report at 69. Alert and oriented x 4. Vital signs was taken. No skin issue. Room is set up. Call light is within patient's reach.

## 2018-10-02 LAB — GLUCOSE, CAPILLARY
GLUCOSE-CAPILLARY: 108 mg/dL — AB (ref 70–99)
Glucose-Capillary: 119 mg/dL — ABNORMAL HIGH (ref 70–99)
Glucose-Capillary: 83 mg/dL (ref 70–99)
Glucose-Capillary: 127 mg/dL — ABNORMAL HIGH (ref 70–99)

## 2018-10-02 LAB — COMPREHENSIVE METABOLIC PANEL
ALK PHOS: 75 U/L (ref 38–126)
ALT: 12 U/L (ref 0–44)
AST: 16 U/L (ref 15–41)
Albumin: 2.9 g/dL — ABNORMAL LOW (ref 3.5–5.0)
Anion gap: 7 (ref 5–15)
BILIRUBIN TOTAL: 0.4 mg/dL (ref 0.3–1.2)
BUN: 13 mg/dL (ref 8–23)
CO2: 25 mmol/L (ref 22–32)
Calcium: 8 mg/dL — ABNORMAL LOW (ref 8.9–10.3)
Chloride: 107 mmol/L (ref 98–111)
Creatinine, Ser: 0.92 mg/dL (ref 0.44–1.00)
GFR calc Af Amer: >60 mL/min (ref >60)
GFR calc non Af Amer: >60 mL/min (ref >60)
Glucose, Bld: 133 mg/dL — ABNORMAL HIGH (ref 70–99)
Potassium: 3.6 mmol/L (ref 3.5–5.1)
Sodium: 139 mmol/L (ref 135–145)
TOTAL PROTEIN: 6.2 g/dL — AB (ref 6.5–8.1)

## 2018-10-02 LAB — HIV ANTIBODY (ROUTINE TESTING W REFLEX): HIV Screen 4th Generation wRfx: NONREACTIVE

## 2018-10-02 MED ORDER — SODIUM CHLORIDE 0.9 % IV SOLN
INTRAVENOUS | Status: AC
  Administered 2018-10-02 – 2018-10-03 (×2): via INTRAVENOUS

## 2018-10-02 MED ORDER — SIMETHICONE 80 MG PO CHEW
80.0000 mg | CHEWABLE_TABLET | Freq: Four times a day (QID) | ORAL | Status: DC | PRN
  Administered 2018-10-02 (×2): 80 mg via ORAL
  Filled 2018-10-02 (×2): qty 1

## 2018-10-02 NOTE — Evaluation (Signed)
Physical Therapy Evaluation-1x Patient Details Name: Jenny Cardenas MRN: [id number] DOB: [birth year] Today's Date: 10/02/2018   History of Present Illness  68 yo female admitted with acute pyelonephritis. Hx of DM, hypothyroidism, LE edema, obesity, PTSD, OA  Clinical Impression  On eval, pt was Mod Ind with mobility. She walked ~250 feet with and without use of IV pole. No LOB. No acute PT needs. Will sign off.     Follow Up Recommendations No PT follow up    Equipment Recommendations  None recommended by PT    Recommendations for Other Services       Precautions / Restrictions Precautions Precautions: Fall Restrictions Weight Bearing Restrictions: No      Mobility  Bed Mobility Overal bed mobility: Modified Independent                Transfers Overall transfer level: Modified independent                  Ambulation/Gait Ambulation/Gait assistance: Modified independent (Device/Increase time) Gait Distance (Feet): 250 Feet Assistive device: None Gait Pattern/deviations: Step-through pattern        Stairs            Wheelchair Mobility    Modified Rankin (Stroke Patients Only)       Balance Overall balance assessment: Modified Independent                                           Pertinent Vitals/Pain Pain Assessment: No/denies pain    Home Living Family/patient expects to be discharged to:: Private residence Living Arrangements: Parent(pt takes care of her mother) Available Help at Discharge: Family Type of Home: House Home Access: Stairs to enter   Technical brewer of Steps: 6 Home Layout: One level Home Equipment: None      Prior Function Level of Independence: Independent               Hand Dominance        Extremity/Trunk Assessment   Upper Extremity Assessment Upper Extremity Assessment: Overall WFL for tasks assessed    Lower Extremity Assessment Lower Extremity Assessment:  Overall WFL for tasks assessed    Cervical / Trunk Assessment Cervical / Trunk Assessment: Normal  Communication   Communication: No difficulties  Cognition Arousal/Alertness: Awake/alert Behavior During Therapy: WFL for tasks assessed/performed Overall Cognitive Status: Within Functional Limits for tasks assessed                                        General Comments      Exercises     Assessment/Plan    PT Assessment Patent does not need any further PT services  PT Problem List         PT Treatment Interventions      PT Goals (Current goals can be found in the Care Plan section)  Acute Rehab PT Goals Patient Stated Goal: to get better PT Goal Formulation: All assessment and education complete, DC therapy    Frequency     Barriers to discharge        Co-evaluation               AM-PAC PT "6 Clicks" Mobility  Outcome Measure Help needed turning from your back to your side while in a flat  bed without using bedrails?: None Help needed moving from lying on your back to sitting on the side of a flat bed without using bedrails?: None Help needed moving to and from a bed to a chair (including a wheelchair)?: None Help needed standing up from a chair using your arms (e.g., wheelchair or bedside chair)?: None Help needed to walk in hospital room?: None Help needed climbing 3-5 steps with a railing? : None 6 Click Score: 24    End of Session   Activity Tolerance: Patient tolerated treatment well Patient left: in bed;with call bell/phone within reach        Time: 3794-3276 PT Time Calculation (min) (ACUTE ONLY): 40 min   Charges:   PT Evaluation $PT Eval Low Complexity: 1 Low PT Treatments $Gait Training: 8-22 mins          Weston Anna, PT Acute Rehabilitation Services Pager: [phone] Office: [phone]

## 2018-10-02 NOTE — Progress Notes (Signed)
PROGRESS NOTE  Jenny Cardenas  NOB:[id number] DOB: Jun 29, 1951 DOA: 09/30/2018 PCP: Maurice Small, MD   Brief Narrative: Jenny Cardenas is a 68 y.o. female with a history of T2DM, hypothyroidism, lower extremity edema, nephrolithiasis, and diverticulosis on colonoscopy 2015 who presented to the ED with left-sided flank pain associated with nausea, vomiting, diarrhea. She reports passing a kidney stone 4 days PTA but continued to have dysuria and the above symptoms. WBC 17.9k, hgb 11.4k, lactic acid 0.8, and significant pyuria, bacteriuria. CT abd/pelvis demonstrated perinephric and periureteral fat stranding on the left as well as changes consistent with mild distal descending colon diverticulitis. She was admitted for IV fluids given inability to take po, and ceftriaxone and flagyl for pyelonephritis and diverticulitis. She continues to be unable to take po  Assessment & Plan: Principal Problem:   Acute pyelonephritis Active Problems:   Acute diverticulitis  Left pyelonephritis:  - Continue ceftriaxone for typical pathogens.  - Urine culture listed as sent, no results back yet. No blood cultures.   Acute diverticulitis: Uncomplicated. Suspect this is mild - Continue liquid diet while having GI symptoms  Nephrolithiasis: Resolved based on CT at admission.  - Would benefit from urology follow up.  T2DM:  - Hold metformin  - Continue SSI - Update HbA1c.   Pulmonary nodules, incidental:  - Former smoker, so recommendation is surveillance CT as outpatient.   Mild normocytic anemia, suspect acute blood loss anemia: Possibly due to recent stone, but no ongoing bleeding noted.  - Trend.   Hypothyroidism: Recent TSH wnl.  - Continue synthroid.   Anxiety, depression: Stable - Continue home medications  Obesity:  - Weight loss recommended, BMI 32  DVT prophylaxis: Lovenox Code Status: Full Family Communication: None at bedside Disposition Plan: Home once no longer  IV-dependent.  Consultants:   None  Procedures:   None  Antimicrobials:  Ceftriaxone, flagyl 2/19 >>    Subjective: Feels pain is stable, having dry heaving. No diarrhea since admission.   Objective: Vitals:   10/01/18 1524 10/01/18 2028 10/02/18 0518 10/02/18 1000  BP: 112/60 (!) 111/57 120/60 130/67  Pulse: 83 73 84 83  Resp:  17 19 18   Temp: 98.8 F (37.1 C) 99 F (37.2 C) 98.8 F (37.1 C) 98.6 F (37 C)  TempSrc:      SpO2: 92% 92% 93% 94%  Weight:      Height:        Intake/Output Summary (Last 24 hours) at 10/02/2018 1306 Last data filed at 10/02/2018 0600 Gross per 24 hour  Intake 1151.55 ml  Output -  Net 1151.55 ml   Filed Weights   09/30/18 1606  Weight: 83.9 kg    Gen: 68 y.o. female in no distress  Pulm: Non-labored breathing. Clear to auscultation bilaterally.  CV: Regular rate and rhythm. No murmur, rub, or gallop. No JVD, no pedal edema. GI: Abdomen soft, very modestly tender in LLQ, modestly in RUQ, non-distended, with normoactive bowel sounds. No organomegaly or masses felt. +Left CVA tenderness. Ext: Warm, no deformities Skin: No rashes, lesions or ulcers Neuro: Alert and oriented. No focal neurological deficits. Psych: Judgement and insight appear normal. Mood & affect appropriate.   Data Reviewed: I have personally reviewed following labs and imaging studies  CBC: Recent Labs  Lab 09/30/18 1715  WBC 17.9*  HGB 11.4*  HCT 36.4  MCV 88.6  PLT 947   Basic Metabolic Panel: Recent Labs  Lab 09/30/18 1715 10/02/18 0630  NA 134* 139  K  3.7 3.6  CL 99 107  CO2 26 25  GLUCOSE 134* 133*  BUN 16 13  CREATININE 1.09* 0.92  CALCIUM 8.3* 8.0*   GFR: Estimated Creatinine Clearance: 60.1 mL/min (by C-G formula based on SCr of 0.92 mg/dL). Liver Function Tests: Recent Labs  Lab 09/30/18 1715 10/02/18 0630  AST 16 16  ALT 11 12  ALKPHOS 93 75  BILITOT 0.8 0.4  PROT 7.3 6.2*  ALBUMIN 3.5 2.9*   Recent Labs  Lab  09/30/18 1715  LIPASE 28   No results for input(s): AMMONIA in the last 168 hours. Coagulation Profile: No results for input(s): INR, PROTIME in the last 168 hours. Cardiac Enzymes: No results for input(s): CKTOTAL, CKMB, CKMBINDEX, TROPONINI in the last 168 hours. BNP (last 3 results) No results for input(s): PROBNP in the last 8760 hours. HbA1C: No results for input(s): HGBA1C in the last 72 hours. CBG: Recent Labs  Lab 10/01/18 1143 10/01/18 1614 10/02/18 0816 10/02/18 1209  GLUCAP 174* 84 119* 108*   Lipid Profile: No results for input(s): CHOL, HDL, LDLCALC, TRIG, CHOLHDL, LDLDIRECT in the last 72 hours. Thyroid Function Tests: No results for input(s): TSH, T4TOTAL, FREET4, T3FREE, THYROIDAB in the last 72 hours. Anemia Panel: No results for input(s): VITAMINB12, FOLATE, FERRITIN, TIBC, IRON, RETICCTPCT in the last 72 hours. Urine analysis:    Component Value Date/Time   COLORURINE YELLOW 09/30/2018 1716   APPEARANCEUR CLOUDY (A) 09/30/2018 1716   LABSPEC 1.017 09/30/2018 1716   PHURINE 5.0 09/30/2018 1716   GLUCOSEU NEGATIVE 09/30/2018 1716   HGBUR MODERATE (A) 09/30/2018 1716   HGBUR 2+ 04/04/2010 0843   BILIRUBINUR NEGATIVE 09/30/2018 1716   KETONESUR NEGATIVE 09/30/2018 1716   PROTEINUR 100 (A) 09/30/2018 1716   UROBILINOGEN 0.2 06/01/2015 1347   NITRITE POSITIVE (A) 09/30/2018 1716   LEUKOCYTESUR LARGE (A) 09/30/2018 1716   No results found for this or any previous visit (from the past 240 hour(s)).    Radiology Studies: Ct Renal Stone Study  Result Date: 09/30/2018 CLINICAL DATA:  Left-sided flank pain EXAM: CT ABDOMEN AND PELVIS WITHOUT CONTRAST TECHNIQUE: Multidetector CT imaging of the abdomen and pelvis was performed following the standard protocol without IV contrast. COMPARISON:  Report 02/01/2000 FINDINGS: Lower chest: Lung bases demonstrate no acute consolidation or effusion. Small pulmonary nodules in the right middle lobe and right lung base  measuring up to 5 mm in size. Heart size within normal limits Hepatobiliary: No focal liver abnormality is seen. No gallstones, gallbladder wall thickening, or biliary dilatation. Pancreas: Unremarkable. No pancreatic ductal dilatation or surrounding inflammatory changes. Spleen: Normal in size without focal abnormality. Adrenals/Urinary Tract: Adrenal glands are normal. No hydronephrosis. Asymmetric left perinephric fat stranding. No ureteral stone. Bladder is normal Stomach/Bowel: Stomach is nonenlarged. No dilated small bowel. Diverticular disease of the colon. Focal inflammatory changes adjacent to the distal descending colon with minimal wall thickening Vascular/Lymphatic: Moderate aortic atherosclerosis. No aneurysm. No significantly enlarged lymph nodes. Reproductive: Status post hysterectomy. No adnexal masses. Other: Negative for free air or free fluid. Edema, small fluid and soft tissue stranding extending along the left retroperitoneum. Musculoskeletal: No acute or significant osseous findings. Treated compression deformities at T8 and T10 IMPRESSION: 1. No hydronephrosis or ureteral stone. Asymmetric perinephric fat stranding on the left with soft tissue stranding at the left renal pelvis and surrounding left ureter. Findings could be secondary to recently passed stone or ascending urinary tract infection. 2. Additional finding of diffuse diverticular disease of the colon. Focal inflammatory change  which appears to be associated with the distal descending colon and may indicate a mild acute diverticulitis. 3. Small pulmonary nodules in the right middle lobe and right base. No follow-up needed if patient is low-risk (and has no known or suspected primary neoplasm). Non-contrast chest CT can be considered in 12 months if patient is high-risk. This recommendation follows the consensus statement: Guidelines for Management of Incidental Pulmonary Nodules Detected on CT Images: From the Fleischner Society  2017; Radiology 2017; 284:228-243. Electronically Signed   By: Donavan Foil M.D.   On: 09/30/2018 23:01    Scheduled Meds: . buPROPion  150 mg Oral Daily  . cycloSPORINE  1 drop Both Eyes BID  . enoxaparin (LOVENOX) injection  40 mg Subcutaneous Q24H  . insulin aspart  0-15 Units Subcutaneous TID WC  . levothyroxine  25 mcg Oral QAC breakfast  . pantoprazole  40 mg Oral Daily  . pravastatin  10 mg Oral Daily  . sertraline  150 mg Oral Daily  . sodium chloride flush  3 mL Intravenous Once  . valACYclovir  500 mg Oral Daily   Continuous Infusions: . cefTRIAXone (ROCEPHIN)  IV 2 g (10/02/18 1042)  . metronidazole 500 mg (10/02/18 0846)     LOS: 1 day   Time spent: 25 minutes.  Patrecia Pour, MD [facility] [email or web address] Password [email or web address] 10/02/2018, 1:06 PM

## 2018-10-03 LAB — CBC
HCT: 32.8 % — ABNORMAL LOW (ref 36.0–46.0)
Hemoglobin: 9.8 g/dL — ABNORMAL LOW (ref 12.0–15.0)
MCH: 27.8 pg (ref 26.0–34.0)
MCHC: 29.9 g/dL — ABNORMAL LOW (ref 30.0–36.0)
MCV: 92.9 fL (ref 80.0–100.0)
Platelets: 310 10*3/uL (ref 150–400)
RBC: 3.53 MIL/uL — AB (ref 3.87–5.11)
RDW: 13.2 % (ref 11.5–15.5)
WBC: 7.7 10*3/uL (ref 4.0–10.5)
nRBC: 0 % (ref 0.0–0.2)

## 2018-10-03 LAB — GLUCOSE, CAPILLARY
Glucose-Capillary: 101 mg/dL — ABNORMAL HIGH (ref 70–99)
Glucose-Capillary: 91 mg/dL (ref 70–99)
Glucose-Capillary: 94 mg/dL (ref 70–99)
Glucose-Capillary: 134 mg/dL — ABNORMAL HIGH (ref 70–99)

## 2018-10-03 LAB — HEMOGLOBIN A1C
Hgb A1c MFr Bld: 6.5 % — ABNORMAL HIGH (ref 4.8–5.6)
Mean Plasma Glucose: 140 mg/dL

## 2018-10-03 MED ORDER — PHENOL 1.4 % MT LIQD
1.0000 | OROMUCOSAL | Status: DC | PRN
  Administered 2018-10-03: 1 via OROMUCOSAL
  Filled 2018-10-03: qty 177

## 2018-10-03 MED ORDER — PROMETHAZINE HCL 25 MG/ML IJ SOLN
6.2500 mg | Freq: Four times a day (QID) | INTRAMUSCULAR | Status: DC | PRN
  Administered 2018-10-03 – 2018-10-04 (×3): 6.25 mg via INTRAVENOUS
  Filled 2018-10-03 (×3): qty 1

## 2018-10-03 NOTE — Progress Notes (Addendum)
PROGRESS NOTE  Jenny Cardenas  OIB:[id number] DOB: 1950/11/04 DOA: 09/30/2018 PCP: Maurice Small, MD   Brief Narrative: Jenny Cardenas is a 68 y.o. female with a history of T2DM, hypothyroidism, lower extremity edema, nephrolithiasis, and diverticulosis on colonoscopy 2015 who presented to the ED with left-sided flank pain associated with nausea, vomiting, diarrhea. She reports passing a kidney stone 4 days PTA but continued to have dysuria and the above symptoms. WBC 17.9k, hgb 11.4k, lactic acid 0.8, and significant pyuria, bacteriuria. CT abd/pelvis demonstrated perinephric and periureteral fat stranding on the left as well as changes consistent with mild distal descending colon diverticulitis. She was admitted for IV fluids given inability to take po, and ceftriaxone and flagyl for pyelonephritis and diverticulitis. She continues to be unable to take po  Assessment & Plan: Principal Problem:   Acute pyelonephritis Active Problems:   Acute diverticulitis  Left pyelonephritis:  - Continue ceftriaxone for typical pathogens.  - Urine culture listed as sent, but now noted to be cancelled. Unclear reason and no blood cultures sent at admission. Fortunately, appears to be improving with resolved leukocytosis.   Acute diverticulitis: Uncomplicated. Suspect this is mild - Continue liquid diet while having GI symptoms. May advance this PM if vomiting improved.   Vomiting:  - Continue antiemetics, add phenergan low dose.  - DC unnecessary po medications. If vomiting continues, will change more to IV. - Continue IVF  Mild normocytic anemia, suspect acute blood loss anemia: Possibly due to recent stone, but no ongoing bleeding noted.  - Worsened again on 2/21 but patient denies bleeding, no hematemesis or blood in stool. - Check FOBT  Nephrolithiasis: Resolved based on CT at admission.  - Would benefit from urology follow up.  T2DM: HbA1c 6.5%. - Hold metformin  - Continue SSI  Pulmonary  nodules, incidental:  - Former smoker, so recommendation is surveillance CT as outpatient.   Hypothyroidism: Recent TSH wnl.  - Continue synthroid.   Anxiety, depression: Stable - Continue home medications  Obesity:  - Weight loss recommended, BMI 32  DVT prophylaxis: Lovenox Code Status: Full Family Communication: None at bedside Disposition Plan: Home once no longer IV-dependent. Vomited pills this morning, will reevaluate for DC 2/22.   Consultants:   None  Procedures:   None  Antimicrobials:  Ceftriaxone, flagyl 2/19 >>    Subjective: Retching upon entering room, not able to keep pills down. Some nausea, but mostly vomiting. Pain has improved and she's afebrile. Was hoping to go home today. Has some loose stools this morning. No abd pain.  Objective: Vitals:   10/02/18 1000 10/02/18 1407 10/02/18 2110 10/03/18 0500  BP: 130/67 (!) 145/76 (!) 119/59 131/70  Pulse: 83 96 77 83  Resp: 18 18 15 17   Temp: 98.6 F (37 C) 98.4 F (36.9 C) 98.5 F (36.9 C) 97.7 F (36.5 C)  TempSrc:  Oral Oral Oral  SpO2: 94% 95% 96% 96%  Weight:      Height:        Intake/Output Summary (Last 24 hours) at 10/03/2018 1217 Last data filed at 10/03/2018 0300 Gross per 24 hour  Intake 1258.48 ml  Output -  Net 1258.48 ml   Filed Weights   09/30/18 1606  Weight: 83.9 kg   Gen: 68 y.o. female in no distress Pulm: Nonlabored breathing room air. Clear. CV: Regular rate and rhythm. No murmur, rub, or gallop. No JVD, no dependent edema. GI: Abdomen soft, not significantly tender, non-distended, with normoactive bowel sounds. Minimal left  CVA tenderness. Ext: Warm, no deformities Skin: No rashes, lesions or ulcers on visualized skin. Neuro: Alert and oriented. No focal neurological deficits. Psych: Judgement and insight appear fair. Mood euthymic & affect congruent. Behavior is appropriate.    Data Reviewed: I have personally reviewed following labs and imaging  studies  CBC: Recent Labs  Lab 09/30/18 1715 10/03/18 0626  WBC 17.9* 7.7  HGB 11.4* 9.8*  HCT 36.4 32.8*  MCV 88.6 92.9  PLT 310 017   Basic Metabolic Panel: Recent Labs  Lab 09/30/18 1715 10/02/18 0630  NA 134* 139  K 3.7 3.6  CL 99 107  CO2 26 25  GLUCOSE 134* 133*  BUN 16 13  CREATININE 1.09* 0.92  CALCIUM 8.3* 8.0*   GFR: Estimated Creatinine Clearance: 60.1 mL/min (by C-G formula based on SCr of 0.92 mg/dL). Liver Function Tests: Recent Labs  Lab 09/30/18 1715 10/02/18 0630  AST 16 16  ALT 11 12  ALKPHOS 93 75  BILITOT 0.8 0.4  PROT 7.3 6.2*  ALBUMIN 3.5 2.9*   Recent Labs  Lab 09/30/18 1715  LIPASE 28   No results for input(s): AMMONIA in the last 168 hours. Coagulation Profile: No results for input(s): INR, PROTIME in the last 168 hours. Cardiac Enzymes: No results for input(s): CKTOTAL, CKMB, CKMBINDEX, TROPONINI in the last 168 hours. BNP (last 3 results) No results for input(s): PROBNP in the last 8760 hours. HbA1C: Recent Labs    10/02/18 0630  HGBA1C 6.5*   CBG: Recent Labs  Lab 10/02/18 0816 10/02/18 1209 10/02/18 1645 10/02/18 2111 10/03/18 0828  GLUCAP 119* 108* 83 127* 101*   Lipid Profile: No results for input(s): CHOL, HDL, LDLCALC, TRIG, CHOLHDL, LDLDIRECT in the last 72 hours. Thyroid Function Tests: No results for input(s): TSH, T4TOTAL, FREET4, T3FREE, THYROIDAB in the last 72 hours. Anemia Panel: No results for input(s): VITAMINB12, FOLATE, FERRITIN, TIBC, IRON, RETICCTPCT in the last 72 hours. Urine analysis:    Component Value Date/Time   COLORURINE YELLOW 09/30/2018 1716   APPEARANCEUR CLOUDY (A) 09/30/2018 1716   LABSPEC 1.017 09/30/2018 1716   PHURINE 5.0 09/30/2018 1716   GLUCOSEU NEGATIVE 09/30/2018 1716   HGBUR MODERATE (A) 09/30/2018 1716   HGBUR 2+ 04/04/2010 0843   BILIRUBINUR NEGATIVE 09/30/2018 1716   KETONESUR NEGATIVE 09/30/2018 1716   PROTEINUR 100 (A) 09/30/2018 1716   UROBILINOGEN 0.2  06/01/2015 1347   NITRITE POSITIVE (A) 09/30/2018 1716   LEUKOCYTESUR LARGE (A) 09/30/2018 1716   No results found for this or any previous visit (from the past 240 hour(s)).    Radiology Studies: No results found.  Scheduled Meds: . buPROPion  150 mg Oral Daily  . cycloSPORINE  1 drop Both Eyes BID  . enoxaparin (LOVENOX) injection  40 mg Subcutaneous Q24H  . insulin aspart  0-15 Units Subcutaneous TID WC  . levothyroxine  25 mcg Oral QAC breakfast  . pantoprazole  40 mg Oral Daily  . sertraline  150 mg Oral Daily  . sodium chloride flush  3 mL Intravenous Once  . valACYclovir  500 mg Oral Daily   Continuous Infusions: . sodium chloride 75 mL/hr at 10/03/18 0638  . cefTRIAXone (ROCEPHIN)  IV 2 g (10/03/18 1042)  . metronidazole 500 mg (10/03/18 0900)     LOS: 2 days   Time spent: 25 minutes.  Patrecia Pour, MD [facility] [email or web address] Password [facility] 10/03/2018, 12:17 PM

## 2018-10-03 NOTE — Care Management Important Message (Signed)
Important Message  Patient Details  Name: Jenny Cardenas MRN: [id number] Date of Birth: 1950-09-04   Medicare Important Message Given:  Yes    Kerin Salen 10/03/2018, 3:18 [place] Message  Patient Details  Name: Jenny Cardenas MRN: [id number] Date of Birth: [birth year]   Medicare Important Message Given:  Yes    Kerin Salen 10/03/2018, 3:18 PM

## 2018-10-04 LAB — BASIC METABOLIC PANEL
Anion gap: 7 (ref 5–15)
BUN: 6 mg/dL — ABNORMAL LOW (ref 8–23)
CO2: 27 mmol/L (ref 22–32)
Calcium: 8.5 mg/dL — ABNORMAL LOW (ref 8.9–10.3)
Chloride: 110 mmol/L (ref 98–111)
Creatinine, Ser: 0.89 mg/dL (ref 0.44–1.00)
GFR calc Af Amer: >60 mL/min (ref >60)
GFR calc non Af Amer: >60 mL/min (ref >60)
Glucose, Bld: 113 mg/dL — ABNORMAL HIGH (ref 70–99)
POTASSIUM: 3.4 mmol/L — AB (ref 3.5–5.1)
Sodium: 144 mmol/L (ref 135–145)

## 2018-10-04 LAB — CBC
HEMATOCRIT: 36.8 % (ref 36.0–46.0)
HEMOGLOBIN: 11.3 g/dL — AB (ref 12.0–15.0)
MCH: 28.2 pg (ref 26.0–34.0)
MCHC: 30.7 g/dL (ref 30.0–36.0)
MCV: 91.8 fL (ref 80.0–100.0)
Platelets: 420 10*3/uL — ABNORMAL HIGH (ref 150–400)
RBC: 4.01 MIL/uL (ref 3.87–5.11)
RDW: 13.3 % (ref 11.5–15.5)
WBC: 9 10*3/uL (ref 4.0–10.5)
nRBC: 0 % (ref 0.0–0.2)

## 2018-10-04 LAB — GLUCOSE, CAPILLARY: Glucose-Capillary: 108 mg/dL — ABNORMAL HIGH (ref 70–99)

## 2018-10-04 MED ORDER — SODIUM CHLORIDE 0.9 % IV SOLN
INTRAVENOUS | Status: DC | PRN
  Administered 2018-10-04: 1000 mL via INTRAVENOUS

## 2018-10-04 MED ORDER — METRONIDAZOLE 500 MG PO TABS: 500.0000 mg | ORAL_TABLET | Freq: Three times a day (TID) | ORAL | 0 refills | Status: DC

## 2018-10-04 MED ORDER — CEFDINIR 300 MG PO CAPS: 300.0000 mg | ORAL_CAPSULE | Freq: Two times a day (BID) | ORAL | 0 refills | Status: DC

## 2018-10-04 MED ORDER — PROMETHAZINE HCL 12.5 MG PO TABS: 6.2500 mg | ORAL_TABLET | Freq: Three times a day (TID) | ORAL | 0 refills | Status: DC | PRN

## 2018-10-04 MED ORDER — ONDANSETRON 4 MG PO TBDP: 4.0000 mg | ORAL_TABLET | Freq: Three times a day (TID) | ORAL | 0 refills | Status: DC | PRN

## 2018-10-04 MED ORDER — METRONIDAZOLE 500 MG PO TABS: 500.0000 mg | ORAL_TABLET | Freq: Three times a day (TID) | ORAL | Status: DC

## 2018-10-04 MED ORDER — CEFDINIR 300 MG PO CAPS
300.0000 mg | ORAL_CAPSULE | Freq: Two times a day (BID) | ORAL | Status: DC
  Administered 2018-10-04: 300 mg via ORAL
  Filled 2018-10-04: qty 1

## 2018-10-04 NOTE — Discharge Summary (Signed)
Physician Discharge Summary  Jenny Cardenas ZWC:[id number] DOB: Sep 26, 1950 DOA: 09/30/2018  PCP: Maurice Small, MD  Admit date: 09/30/2018 Discharge date: 10/04/2018  Admitted From: Home Disposition: Home   Recommendations for Outpatient Follow-up:  1. Follow up with PCP in 1-2 weeks 2. Consider referral to urology for nephrolithiasis.  3. Please obtain BMP/CBC in one week. 4. Will need follow up of incidental RML, RLL pulmonary nodules with surveillance CT chest in 12 months from 09/30/2018. This is because the patient is a former smoker, and was discussed with family during this admission.   Home Health: None Equipment/Devices: None Discharge Condition: Stable CODE STATUS: Full Diet recommendation: As tolerated, carb-modified  Brief/Interim Summary: Jenny Cardenas is a 68 y.o. female with a history of T2DM, hypothyroidism, lower extremity edema, nephrolithiasis, and diverticulosis on colonoscopy 2015 who presented to the ED with left-sided flank pain associated with nausea, vomiting, diarrhea. She reports passing a kidney stone 4 days PTA but continued to have dysuria and the above symptoms. WBC 17.9k, hgb 11.4k, lactic acid 0.8, and significant pyuria, bacteriuria. CT abd/pelvis demonstrated perinephric and periureteral fat stranding on the left as well as changes consistent with mild distal descending colon diverticulitis. She was admitted for IV fluids given inability to take po, and ceftriaxone and flagyl for pyelonephritis and diverticulitis. Leukocytosis resolved and ultimately nausea was controlled enough to take adequate per oral intake to maintain hydration and tolerate oral medications. Vital signs are normal, afebrile, and she is stable for discharge.   Discharge Diagnoses:  Principal Problem:   Acute pyelonephritis Active Problems:   Acute diverticulitis  Left pyelonephritis:  - Continued ceftriaxone for typical pathogens.  - Urine culture listed as sent, but now noted to be  cancelled. Unclear reason and no blood cultures sent at admission. Fortunately, appears to be improving with resolved leukocytosis. Because of this, will discharge on 3rd generation cephalosporin, cefdinir.  Acute diverticulitis: Uncomplicated. Suspect this is mild - Tolerating an advanced diet with oral medications and antiemetics prior to discharge. Symptoms have improved.   Vomiting:  - Continue antiemetics, add phenergan low dose. Resolved.  Mild normocytic anemia, suspect acute blood loss anemia: Possibly due to recent stone, but no ongoing bleeding noted. Hgb has rebounded prior to discharge without further intervention.  - Recheck CBC at follow up.   Nephrolithiasis: Resolved based on CT at admission.  - Would benefit from urology follow up.  T2DM: HbA1c 6.5%. - Hold metformin  - Continue SSI  Pulmonary nodules, incidental:  - Former smoker, so recommendation is surveillance CT as outpatient.   Hypothyroidism: Recent TSH wnl.  - Continue synthroid.   Anxiety, depression: Stable - Continue home medications  Obesity:  - Weight loss recommended, BMI 32  Discharge Instructions Discharge Instructions    Diet - low sodium heart healthy   Complete by:  As directed    Discharge instructions   Complete by:  As directed    You were admitted for kidney infection (pyelonephritis) and inflammation of diverticulosis (diverticulitis) and have improved on the appropriate antibiotics. You have tolerated ora medications and are stable for discharge with the following recommendations:  - Continue taking cefdinir and flagyl for the entire courses.  - You may take zofran as needed for nausea or vomiting and if needed, can take phenergan for refractory nausea or vomiting. - Follow up with your PCP in the next 1-2 weeks. I have recommended follow up labs and a CT of your chest to be done next February. This is  because some small nodules were found on the CT performed at admission and  you have a history of smoking. It is NOT because these nodules have concerning features for cancer. - If your symptoms worsen, seek medical attention right away.   Increase activity slowly   Complete by:  As directed      Allergies as of 10/04/2018      Reactions   Other    Etholine Oxide: causes itching and hives      Medication List    TAKE these medications   albuterol (2.5 MG/3ML) 0.083% nebulizer solution Commonly known as:  PROVENTIL Take 2.5 mg by nebulization every 4 (four) hours as needed for wheezing or shortness of breath.   buPROPion 150 MG 24 hr tablet Commonly known as:  WELLBUTRIN XL Take 150 mg by mouth daily.   cefdinir 300 MG capsule Commonly known as:  OMNICEF Take 1 capsule (300 mg total) by mouth every 12 (twelve) hours. completes course for pyelonephritis   cycloSPORINE 0.05 % ophthalmic emulsion Commonly known as:  RESTASIS Place 1 drop into both eyes 2 (two) times daily.   furosemide 20 MG tablet Commonly known as:  LASIX Take 20 mg by mouth daily.   levothyroxine 25 MCG tablet Commonly known as:  SYNTHROID, LEVOTHROID Take 25 mcg by mouth daily before breakfast.   metFORMIN 500 MG 24 hr tablet Commonly known as:  GLUCOPHAGE-XR Take 500 mg by mouth every evening.   metroNIDAZOLE 500 MG tablet Commonly known as:  FLAGYL Take 1 tablet (500 mg total) by mouth every 8 (eight) hours. completes course for diverticulitis   MOTRIN PM 200-38 MG Tabs Generic drug:  Ibuprofen-diphenhydrAMINE Cit Take 1 tablet by mouth at bedtime as needed (sleep).   ondansetron 4 MG disintegrating tablet Commonly known as:  ZOFRAN-ODT Take 1 tablet (4 mg total) by mouth every 8 (eight) hours as needed for nausea or vomiting.   pantoprazole 40 MG tablet Commonly known as:  PROTONIX Take 40 mg by mouth daily.   pravastatin 10 MG tablet Commonly known as:  PRAVACHOL Take 10 mg by mouth daily.   promethazine 12.5 MG tablet Commonly known as:  PHENERGAN Take  0.5-1 tablets (6.25-12.5 mg total) by mouth every 8 (eight) hours as needed for refractory nausea / vomiting.   sertraline 100 MG tablet Commonly known as:  ZOLOFT Take 150 mg by mouth daily.   simethicone 80 MG chewable tablet Commonly known as:  MYLICON Chew 80 mg by mouth every 6 (six) hours as needed for flatulence.   valACYclovir 500 MG tablet Commonly known as:  VALTREX Take 1 tablet (500 mg total) by mouth daily.      Follow-up Information    Maurice Small, MD. Schedule an appointment as soon as possible for a visit in 1 week(s).   Specialty:  Family Medicine Contact information: [address] [place] [address] [phone]          Allergies  Allergen Reactions  . Other     Etholine Oxide: causes itching and hives    Consultations:  None  Procedures/Studies: Ct Renal Stone Study  Result Date: 09/30/2018 CLINICAL DATA:  Left-sided flank pain EXAM: CT ABDOMEN AND PELVIS WITHOUT CONTRAST TECHNIQUE: Multidetector CT imaging of the abdomen and pelvis was performed following the standard protocol without IV contrast. COMPARISON:  Report 02/01/2000 FINDINGS: Lower chest: Lung bases demonstrate no acute consolidation or effusion. Small pulmonary nodules in the right middle lobe and right lung base measuring  up to 5 mm in size. Heart size within normal limits Hepatobiliary: No focal liver abnormality is seen. No gallstones, gallbladder wall thickening, or biliary dilatation. Pancreas: Unremarkable. No pancreatic ductal dilatation or surrounding inflammatory changes. Spleen: Normal in size without focal abnormality. Adrenals/Urinary Tract: Adrenal glands are normal. No hydronephrosis. Asymmetric left perinephric fat stranding. No ureteral stone. Bladder is normal Stomach/Bowel: Stomach is nonenlarged. No dilated small bowel. Diverticular disease of the colon. Focal inflammatory changes adjacent to the distal descending colon with minimal wall thickening  Vascular/Lymphatic: Moderate aortic atherosclerosis. No aneurysm. No significantly enlarged lymph nodes. Reproductive: Status post hysterectomy. No adnexal masses. Other: Negative for free air or free fluid. Edema, small fluid and soft tissue stranding extending along the left retroperitoneum. Musculoskeletal: No acute or significant osseous findings. Treated compression deformities at T8 and T10 IMPRESSION: 1. No hydronephrosis or ureteral stone. Asymmetric perinephric fat stranding on the left with soft tissue stranding at the left renal pelvis and surrounding left ureter. Findings could be secondary to recently passed stone or ascending urinary tract infection. 2. Additional finding of diffuse diverticular disease of the colon. Focal inflammatory change which appears to be associated with the distal descending colon and may indicate a mild acute diverticulitis. 3. Small pulmonary nodules in the right middle lobe and right base. No follow-up needed if patient is low-risk (and has no known or suspected primary neoplasm). Non-contrast chest CT can be considered in 12 months if patient is high-risk. This recommendation follows the consensus statement: Guidelines for Management of Incidental Pulmonary Nodules Detected on CT Images: From the Fleischner Society 2017; Radiology 2017; 284:228-243. Electronically Signed   By: Donavan Foil M.D.   On: 09/30/2018 23:01     Subjective: Feels "much better." Wants to go home, states she's tolerated a liquid diet and prior to discharge has tolerated a soft diet. Some nausea, not vomiting, no current diarrhea or fever. Pain is resolved.   Discharge Exam: Vitals:   10/03/18 2031 10/04/18 0532  BP: 129/68 139/77  Pulse: 79 85  Resp: 20 20  Temp: 98.7 F (37.1 C) 98.5 F (36.9 C)  SpO2: 93% 94%   General: Pt is alert, awake, not in acute distress Cardiovascular: RRR, S1/S2 +, no rubs, no gallops Respiratory: CTA bilaterally, no wheezing, no rhonchi Abdominal:  Soft, NT, ND, bowel sounds + Extremities: No edema, no cyanosis  Labs: Basic Metabolic Panel: Recent Labs  Lab 09/30/18 1715 10/02/18 0630 10/04/18 0545  NA 134* 139 144  K 3.7 3.6 3.4*  CL 99 107 110  CO2 26 25 27   GLUCOSE 134* 133* 113*  BUN 16 13 6*  CREATININE 1.09* 0.92 0.89  CALCIUM 8.3* 8.0* 8.5*   Liver Function Tests: Recent Labs  Lab 09/30/18 1715 10/02/18 0630  AST 16 16  ALT 11 12  ALKPHOS 93 75  BILITOT 0.8 0.4  PROT 7.3 6.2*  ALBUMIN 3.5 2.9*   Recent Labs  Lab 09/30/18 1715  LIPASE 28   CBC: Recent Labs  Lab 09/30/18 1715 10/03/18 0626 10/04/18 0545  WBC 17.9* 7.7 9.0  HGB 11.4* 9.8* 11.3*  HCT 36.4 32.8* 36.8  MCV 88.6 92.9 91.8  PLT 310 310 420*   CBG: Recent Labs  Lab 10/03/18 0828 10/03/18 1236 10/03/18 1804 10/03/18 2032 10/04/18 0752  GLUCAP 101* 91 94 134* 108*   Hgb A1c Recent Labs    10/02/18 0630  HGBA1C 6.5*   Urinalysis    Component Value Date/Time   COLORURINE YELLOW 09/30/2018 1716  APPEARANCEUR CLOUDY (A) 09/30/2018 1716   LABSPEC 1.017 09/30/2018 1716   PHURINE 5.0 09/30/2018 1716   GLUCOSEU NEGATIVE 09/30/2018 1716   HGBUR MODERATE (A) 09/30/2018 1716   HGBUR 2+ 04/04/2010 0843   BILIRUBINUR NEGATIVE 09/30/2018 1716   KETONESUR NEGATIVE 09/30/2018 1716   PROTEINUR 100 (A) 09/30/2018 1716   UROBILINOGEN 0.2 06/01/2015 1347   NITRITE POSITIVE (A) 09/30/2018 1716   LEUKOCYTESUR LARGE (A) 09/30/2018 1716    Time coordinating discharge: Approximately 40 minutes  Patrecia Pour, MD  [facility] 10/04/2018, 10:12 AM Pager [phone]

## 2018-10-11 ENCOUNTER — Encounter: Payer: Self-pay | Admitting: Internal Medicine

## 2018-10-14 DIAGNOSIS — R0602 Shortness of breath: Secondary | ICD-10-CM | POA: Diagnosis not present

## 2018-10-14 DIAGNOSIS — Z5181 Encounter for therapeutic drug level monitoring: Secondary | ICD-10-CM | POA: Diagnosis not present

## 2018-10-14 DIAGNOSIS — Z09 Encounter for follow-up examination after completed treatment for conditions other than malignant neoplasm: Secondary | ICD-10-CM | POA: Diagnosis not present

## 2018-11-26 ENCOUNTER — Ambulatory Visit: Payer: Self-pay | Admitting: Urology

## 2019-02-23 ENCOUNTER — Other Ambulatory Visit: Payer: Self-pay | Admitting: Family Medicine

## 2019-02-23 DIAGNOSIS — Z1231 Encounter for screening mammogram for malignant neoplasm of breast: Secondary | ICD-10-CM

## 2019-02-25 ENCOUNTER — Ambulatory Visit: Payer: Self-pay | Admitting: Urology

## 2019-03-19 ENCOUNTER — Other Ambulatory Visit: Payer: Self-pay | Admitting: Rheumatology

## 2019-03-24 ENCOUNTER — Ambulatory Visit: Payer: Self-pay | Admitting: Urology

## 2019-04-21 ENCOUNTER — Other Ambulatory Visit: Payer: Self-pay

## 2019-04-21 ENCOUNTER — Ambulatory Visit (INDEPENDENT_AMBULATORY_CARE_PROVIDER_SITE_OTHER): Payer: Medicare Other | Admitting: Urology

## 2019-04-21 ENCOUNTER — Encounter: Payer: Self-pay | Admitting: Urology

## 2019-04-21 VITALS — BP 145/77 | HR 82 | Ht 63.0 in | Wt 185.0 lb

## 2019-04-21 DIAGNOSIS — Z87448 Personal history of other diseases of urinary system: Secondary | ICD-10-CM | POA: Diagnosis not present

## 2019-04-21 DIAGNOSIS — Z87442 Personal history of urinary calculi: Secondary | ICD-10-CM | POA: Diagnosis not present

## 2019-04-21 NOTE — Progress Notes (Signed)
04/21/2019 8:48 PM   Jenny Cardenas 01/23/1951 [id number]  Referring provider: Maurice Small, MD [address] [place],  [place] [address]  Chief Complaint  Patient presents with  . Nephrolithiasis    NEW patient    HPI: 68 year old female who presents today for further evaluation of kidney stones.  Patient was seen and evaluated and admitted back in 09/2022 days after passing a kidney stone.  Her clinical presentation at the time was consistent with diverticulitis.  On CT scan, she was also noted to have inflammation around her left kidney as well as a UTI concerning for possible pyelonephritis versus sequela of recently passed stone.  Patient provides a lengthy history today about her personal, psychiatric, past medical history.  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  She often neglects her own physical wellbeing and health.  After she passed the left kidney stone in early February, she developed fevers, chills, abdominal pain diarrhea nausea vomiting and ultimately presented to the emergency room.  She was admitted with presumed pyelonephritis and diverticulitis.  Notably, no stones were seen on this scan.  She reports that 4 days prior to the admission, she did in fact see a stone pass.  She is not able to catch it.  She does have a remote history of kidney stones.  She is never required surgical intervention for stones.  She does report occasionally poor diet and inadequate p.o. intake of fluids.  Since her admission, she had no further fevers, chills, flank pain, or any other urianry symptoms.  She is doing remarkably well.  Her impetus for being seen today is primarily to establish care.  She is particularly interested having a female provider.   PMH: Past Medical History:  Diagnosis Date  . Anxiety   . Arthritis   . Carpal tunnel syndrome   . Cataract   . Depression   . Diabetes  mellitus without complication (HCC)    Type 2  . Fall April 10, 2015  . GERD (gastroesophageal reflux disease)   . Goiter   . History of shingles    On abdomen, left ring finger, and left leg; Pt takes Valtrex  . Hyperlipidemia   . Hypertension   . Obesity   . PTSD (post-traumatic stress disorder)   . Shortness of breath dyspnea    pt stated its related to the back problems she has  . Thyroid disease   . Ulcer     Surgical History: Past Surgical History:  Procedure Laterality Date  . ABDOMINAL HYSTERECTOMY  1996  . CATARACT EXTRACTION, BILATERAL    . [place]  . COLONOSCOPY W/ POLYPECTOMY    . KYPHOPLASTY N/A 06/02/2015   Procedure: KYPHOPLASTY;  Surgeon: Phylliss Bob, MD;  Location: [facility];  Service: Orthopedics;  Laterality: N/A;  Thoracic 3, 8, 10 kyphoplasty    Home Medications:  Allergies as of 04/21/2019      Reactions   Other    Etholine Oxide: causes itching and hives      Medication List       Accurate as of April 21, 2019  8:48 PM. If you have any questions, ask your nurse or doctor.        albuterol (2.5 MG/3ML) 0.083% nebulizer solution Commonly known as: PROVENTIL Take 2.5 mg by nebulization every 4 (four) hours as needed for wheezing or shortness of breath.  buPROPion 150 MG 24 hr tablet Commonly known as: WELLBUTRIN XL Take 150 mg by mouth daily.   cefdinir 300 MG capsule Commonly known as: OMNICEF Take 1 capsule (300 mg total) by mouth every 12 (twelve) hours. completes course for pyelonephritis   cycloSPORINE 0.05 % ophthalmic emulsion Commonly known as: RESTASIS Place 1 drop into both eyes 2 (two) times daily.   furosemide 20 MG tablet Commonly known as: LASIX Take 20 mg by mouth daily.   levothyroxine 25 MCG tablet Commonly known as: SYNTHROID Take 25 mcg by mouth daily before breakfast.   metFORMIN 500 MG 24 hr tablet Commonly known as: GLUCOPHAGE-XR Take 500 mg by mouth every evening.    metroNIDAZOLE 500 MG tablet Commonly known as: FLAGYL Take 1 tablet (500 mg total) by mouth every 8 (eight) hours. completes course for diverticulitis   Motrin PM 200-38 MG Tabs Generic drug: Ibuprofen-diphenhydrAMINE Cit Take 1 tablet by mouth at bedtime as needed (sleep).   ondansetron 4 MG disintegrating tablet Commonly known as: ZOFRAN-ODT Take 1 tablet (4 mg total) by mouth every 8 (eight) hours as needed for nausea or vomiting.   pantoprazole 40 MG tablet Commonly known as: PROTONIX Take 40 mg by mouth daily.   pravastatin 10 MG tablet Commonly known as: PRAVACHOL Take 10 mg by mouth daily.   promethazine 12.5 MG tablet Commonly known as: PHENERGAN Take 0.5-1 tablets (6.25-12.5 mg total) by mouth every 8 (eight) hours as needed for refractory nausea / vomiting.   sertraline 100 MG tablet Commonly known as: ZOLOFT Take 150 mg by mouth daily.   simethicone 80 MG chewable tablet Commonly known as: MYLICON Chew 80 mg by mouth every 6 (six) hours as needed for flatulence.   valACYclovir 500 MG tablet Commonly known as: Valtrex Take 1 tablet (500 mg total) by mouth daily.   Vitamin D (Ergocalciferol) 1.25 MG (50000 UT) Caps capsule Commonly known as: DRISDOL 1 CAPSULE ONCE A WEEK X 8 WEEKS       Allergies:  Allergies  Allergen Reactions  . Other     Etholine Oxide: causes itching and hives    Family History: Family History  Problem Relation Age of Onset  . COPD Mother   . Depression Mother   . Hypertension Mother   . Hyperlipidemia Mother   . Breast cancer Mother 26  . Alzheimer's disease Father   . Heart disease Father   . Breast cancer Sister 71  . Colon cancer Neg Hx     Social History:  reports that she quit smoking about 2 years ago. Her smoking use included cigarettes. She has a 2.00 pack-year smoking history. She has never used smokeless tobacco. She reports current alcohol use. She reports that she does not use drugs.  ROS: UROLOGY Frequent  Urination?: No Hard to postpone urination?: No Burning/pain with urination?: No Get up at night to urinate?: No Leakage of urine?: No Urine stream starts and stops?: No Trouble starting stream?: No Do you have to strain to urinate?: No Blood in urine?: No Urinary tract infection?: No Sexually transmitted disease?: No Injury to kidneys or bladder?: No Painful intercourse?: No Weak stream?: No Currently pregnant?: No Vaginal bleeding?: No Last menstrual period?: n  Gastrointestinal Nausea?: No Vomiting?: No Indigestion/heartburn?: No Diarrhea?: No Constipation?: No  Constitutional Fever: No Night sweats?: No Weight loss?: No Fatigue?: No  Skin Skin rash/lesions?: No Itching?: No  Eyes Blurred vision?: No Double vision?: No  Ears/Nose/Throat Sore throat?: No Sinus problems?: No  Hematologic/Lymphatic  Swollen glands?: No Easy bruising?: No  Cardiovascular Leg swelling?: No Chest pain?: No  Respiratory Cough?: No Shortness of breath?: No  Endocrine Excessive thirst?: No  Musculoskeletal Back pain?: No Joint pain?: No  Neurological Headaches?: No Dizziness?: No  Psychologic Depression?: No Anxiety?: No  Physical Exam: BP (!) 145/77   Pulse 82   Ht 5\' 3"  (1.6 m)   Wt 185 lb (83.9 kg)   BMI 32.77 kg/m   Constitutional:  Alert and oriented, No acute distress. HEENT: [place] AT, moist mucus membranes.  Trachea midline, no masses. Cardiovascular: No clubbing, cyanosis, or edema. Respiratory: Normal respiratory effort, no increased work of breathing. Skin: No rashes, bruises or suspicious lesions. Neurologic: Grossly intact, no focal deficits, moving all 4 extremities. Psychiatric: Normal mood and affect.  Laboratory Data: Lab Results  Component Value Date   WBC 9.0 10/04/2018   HGB 11.3 (L) 10/04/2018   HCT 36.8 10/04/2018   MCV 91.8 10/04/2018   PLT 420 (H) 10/04/2018    Lab Results  Component Value Date   CREATININE 0.89 10/04/2018     Lab Results  Component Value Date   HGBA1C 6.5 (H) 10/02/2018    Urinalysis UA today reviewed, completely negative.  See epic.  Pertinent Imaging: No results found for this or any previous visit. Results for orders placed during the hospital encounter of 09/30/18  CT Renal Stone Study   Narrative CLINICAL DATA:  Left-sided flank pain  EXAM: CT ABDOMEN AND PELVIS WITHOUT CONTRAST  TECHNIQUE: Multidetector CT imaging of the abdomen and pelvis was performed following the standard protocol without IV contrast.  COMPARISON:  Report 02/01/2000  FINDINGS: Lower chest: Lung bases demonstrate no acute consolidation or effusion. Small pulmonary nodules in the right middle lobe and right lung base measuring up to 5 mm in size. Heart size within normal limits  Hepatobiliary: No focal liver abnormality is seen. No gallstones, gallbladder wall thickening, or biliary dilatation.  Pancreas: Unremarkable. No pancreatic ductal dilatation or surrounding inflammatory changes.  Spleen: Normal in size without focal abnormality.  Adrenals/Urinary Tract: Adrenal glands are normal. No hydronephrosis. Asymmetric left perinephric fat stranding. No ureteral stone. Bladder is normal  Stomach/Bowel: Stomach is nonenlarged. No dilated small bowel. Diverticular disease of the colon. Focal inflammatory changes adjacent to the distal descending colon with minimal wall thickening  Vascular/Lymphatic: Moderate aortic atherosclerosis. No aneurysm. No significantly enlarged lymph nodes.  Reproductive: Status post hysterectomy. No adnexal masses.  Other: Negative for free air or free fluid. Edema, small fluid and soft tissue stranding extending along the left retroperitoneum.  Musculoskeletal: No acute or significant osseous findings. Treated compression deformities at T8 and T10  IMPRESSION: 1. No hydronephrosis or ureteral stone. Asymmetric perinephric fat stranding on the left with soft  tissue stranding at the left renal pelvis and surrounding left ureter. Findings could be secondary to recently passed stone or ascending urinary tract infection. 2. Additional finding of diffuse diverticular disease of the colon. Focal inflammatory change which appears to be associated with the distal descending colon and may indicate a mild acute diverticulitis. 3. Small pulmonary nodules in the right middle lobe and right base. No follow-up needed if patient is low-risk (and has no known or suspected primary neoplasm). Non-contrast chest CT can be considered in 12 months if patient is high-risk. This recommendation follows the consensus statement: Guidelines for Management of Incidental Pulmonary Nodules Detected on CT Images: From the Fleischner Society 2017; Radiology 2017; 284:228-243.   Electronically Signed   By: Maudie Mercury  Francoise Ceo M.D.   On: 09/30/2018 23:01     CT scan reviewed.  Agree with radiologic interpretation.  Assessment & Plan:    1. History of kidney stones Personal history of rare nephrolithiasis Most recent episode of pyelonephritis/diverticulitis preceded by spontaneous interval passage of kidney stone CT scan is reassuring, no residual or retained stones present from 09/2018 She has had no additional issues with flank pain, urinary symptoms and her UA today is negative, I do not feel that further imaging is indicated at this time nor further intervention  We discussed general stone prevention techniques including drinking plenty water with goal of producing 2.5 L urine daily, increased citric acid intake, avoidance of high oxalate containing foods, and decreased salt intake.  Information about dietary recommendations given today.  She was advised that we see patients same day as needed, advised to call if she has any signs or symptoms of flank pain or kidney stones especially to avoid profound clinical presentation requiring admission earlier this year She is  agreeable this plan  - Urinalysis, Complete  2. History of pyelonephritis As above  F/u prn  Hollice Espy, MD  [facility] [address], [place], [place] [address] [phone]

## 2019-04-22 LAB — URINALYSIS, COMPLETE
Bilirubin, UA: NEGATIVE
Glucose, UA: NEGATIVE
Ketones, UA: NEGATIVE
Leukocytes,UA: NEGATIVE
Nitrite, UA: NEGATIVE
Protein,UA: NEGATIVE
Specific Gravity, UA: >1.03 — ABNORMAL HIGH (ref 1.005–1.030)
Urobilinogen, Ur: 0.2 mg/dL (ref 0.2–1.0)
pH, UA: 5 (ref 5.0–7.5)

## 2019-04-22 LAB — MICROSCOPIC EXAMINATION

## 2019-04-27 ENCOUNTER — Other Ambulatory Visit: Payer: Self-pay

## 2019-04-27 ENCOUNTER — Ambulatory Visit
Admission: RE | Admit: 2019-04-27 | Discharge: 2019-04-27 | Disposition: A | Discharge status: Home / Self Care | Payer: Medicare Other | Source: Ambulatory Visit | Attending: Family Medicine | Admitting: Family Medicine

## 2019-04-27 DIAGNOSIS — Z1231 Encounter for screening mammogram for malignant neoplasm of breast: Secondary | ICD-10-CM

## 2019-04-28 ENCOUNTER — Other Ambulatory Visit: Payer: Self-pay | Admitting: Family Medicine

## 2019-04-28 DIAGNOSIS — R928 Other abnormal and inconclusive findings on diagnostic imaging of breast: Secondary | ICD-10-CM

## 2019-05-01 ENCOUNTER — Other Ambulatory Visit: Payer: Self-pay

## 2019-05-01 ENCOUNTER — Ambulatory Visit
Admission: RE | Admit: 2019-05-01 | Discharge: 2019-05-01 | Disposition: A | Discharge status: Home / Self Care | Payer: Medicare Other | Source: Ambulatory Visit | Attending: Family Medicine | Admitting: Family Medicine

## 2019-05-01 ENCOUNTER — Other Ambulatory Visit: Payer: Self-pay | Admitting: Family Medicine

## 2019-05-01 DIAGNOSIS — R928 Other abnormal and inconclusive findings on diagnostic imaging of breast: Secondary | ICD-10-CM

## 2019-05-01 DIAGNOSIS — N6002 Solitary cyst of left breast: Secondary | ICD-10-CM

## 2019-05-12 ENCOUNTER — Other Ambulatory Visit: Payer: Self-pay | Admitting: Family Medicine

## 2019-07-11 ENCOUNTER — Emergency Department (HOSPITAL_COMMUNITY)
Admission: EM | Admit: 2019-07-11 | Discharge: 2019-07-11 | Disposition: A | Discharge status: Home / Self Care | Payer: Medicare Other | Source: Home / Self Care | Attending: Emergency Medicine | Admitting: Emergency Medicine

## 2019-07-11 ENCOUNTER — Emergency Department (HOSPITAL_COMMUNITY): Payer: Medicare Other

## 2019-07-11 ENCOUNTER — Encounter (HOSPITAL_COMMUNITY): Payer: Self-pay | Admitting: *Deleted

## 2019-07-11 DIAGNOSIS — Z7984 Long term (current) use of oral hypoglycemic drugs: Secondary | ICD-10-CM | POA: Insufficient documentation

## 2019-07-11 DIAGNOSIS — S0101XA Laceration without foreign body of scalp, initial encounter: Secondary | ICD-10-CM

## 2019-07-11 DIAGNOSIS — S0990XA Unspecified injury of head, initial encounter: Secondary | ICD-10-CM | POA: Insufficient documentation

## 2019-07-11 DIAGNOSIS — I10 Essential (primary) hypertension: Secondary | ICD-10-CM | POA: Insufficient documentation

## 2019-07-11 DIAGNOSIS — E119 Type 2 diabetes mellitus without complications: Secondary | ICD-10-CM | POA: Insufficient documentation

## 2019-07-11 DIAGNOSIS — M7918 Myalgia, other site: Secondary | ICD-10-CM | POA: Insufficient documentation

## 2019-07-11 DIAGNOSIS — Y939 Activity, unspecified: Secondary | ICD-10-CM | POA: Insufficient documentation

## 2019-07-11 DIAGNOSIS — Z79899 Other long term (current) drug therapy: Secondary | ICD-10-CM | POA: Insufficient documentation

## 2019-07-11 DIAGNOSIS — Y999 Unspecified external cause status: Secondary | ICD-10-CM | POA: Insufficient documentation

## 2019-07-11 DIAGNOSIS — Z87891 Personal history of nicotine dependence: Secondary | ICD-10-CM | POA: Insufficient documentation

## 2019-07-11 DIAGNOSIS — S92001A Unspecified fracture of right calcaneus, initial encounter for closed fracture: Secondary | ICD-10-CM

## 2019-07-11 DIAGNOSIS — W1789XA Other fall from one level to another, initial encounter: Secondary | ICD-10-CM | POA: Insufficient documentation

## 2019-07-11 DIAGNOSIS — Y92008 Other place in unspecified non-institutional (private) residence as the place of occurrence of the external cause: Secondary | ICD-10-CM | POA: Insufficient documentation

## 2019-07-11 MED ORDER — HYDROMORPHONE HCL 1 MG/ML IJ SOLN
0.5000 mg | Freq: Once | INTRAMUSCULAR | Status: AC
  Administered 2019-07-11: 0.5 mg via INTRAVENOUS
  Filled 2019-07-11: qty 1

## 2019-07-11 MED ORDER — OXYCODONE-ACETAMINOPHEN 5-325 MG PO TABS: 1.0000 | ORAL_TABLET | Freq: Four times a day (QID) | ORAL | 0 refills | Status: DC | PRN

## 2019-07-11 MED ORDER — OXYCODONE-ACETAMINOPHEN 5-325 MG PO TABS
1.0000 | ORAL_TABLET | Freq: Once | ORAL | Status: AC
  Administered 2019-07-11: 1 via ORAL
  Filled 2019-07-11: qty 1

## 2019-07-11 MED ORDER — ONDANSETRON HCL 4 MG/2ML IJ SOLN
4.0000 mg | Freq: Once | INTRAMUSCULAR | Status: AC
  Administered 2019-07-11: 4 mg via INTRAVENOUS
  Filled 2019-07-11: qty 2

## 2019-07-11 NOTE — ED Triage Notes (Signed)
Per EMS, pt fell through a broken deck ~87ft today. Pt has swelling to right lateral ankle. Pt has good pedal pulses. EMS also noted blood to back of her head. Pt denies loss of consciousness/ head, neck, or back pain. Pain went from 12/10 to 6/10 after 200 mcg fentanyl.   BP 134.60 HR 100 SpO2 98%

## 2019-07-11 NOTE — Discharge Instructions (Signed)
Please read and follow all provided instructions.  Your diagnoses today include:  1. Closed displaced fracture of right calcaneus, unspecified portion of calcaneus, initial encounter   2. Laceration of scalp, initial encounter   3. Musculoskeletal pain     Tests performed today include:  CT of the head and neck -no acute injuries  X-ray of the knee -no acute injuries  X-ray and CT of the foot/ankle -demonstrates a comminuted calcaneus fracture  Vital signs. See below for your results today.   Medications prescribed:   Percocet (oxycodone/acetaminophen) - narcotic pain medication  DO NOT drive or perform any activities that require you to be awake and alert because this medicine can make you drowsy. BE VERY CAREFUL not to take multiple medicines containing Tylenol (also called acetaminophen). Doing so can lead to an overdose which can damage your liver and cause liver failure and possibly death.  Use pain medication only under direct supervision at the lowest possible dose needed to control your pain.   Take any prescribed medications only as directed.  Home care instructions:   Follow any educational materials contained in this packet  Use the wheelchair until you follow-up with your orthopedic doctor.  You should remain nonweightbearing until you are cleared by your orthopedic doctor.   Follow R.I.C.E. Protocol:  R - rest your injury   I  - use ice on injury without applying directly to skin  C - compress injury with bandage or splint  E - elevate the injury as much as possible   Follow-up instructions: Please call Dr. Pollie Friar office on Monday to schedule an appointment for follow-up.  Return instructions:   Please return if your toes or feet are numb or tingling, appear gray or blue, or you have severe pain (also elevate the leg and loosen splint or wrap if you were given one)  Please return to the Emergency Department if you experience worsening symptoms.    Please return if you have any other emergent concerns.  Additional Information:  Your vital signs today were: BP 118/82    Pulse 95    Temp 97.9 F (36.6 C) (Oral)    Resp 18    SpO2 93%  If your blood pressure (BP) was elevated above 135/85 this visit, please have this repeated by your doctor within one month. --------------

## 2019-07-11 NOTE — ED Provider Notes (Signed)
[facility] DEPT Provider Note   CSN: [id number] Arrival date & time: 07/11/19  1339     History   Chief Complaint Chief Complaint  Patient presents with   Fall   Ankle Injury    HPI Jenny Cardenas is a 68 y.o. female.     Patient with history of diabetes, no anticoagulation use --presents to the emergency department by EMS after a fall through a deck.  Patient states that she fell about 3 feet after the deck broke beneath her.  She twisted her right ankle and has swelling over the lateral ankle.  She also has pain in her right knee.  Milder pain in her right hip.  She has soreness over her entire back.  She struck the back of her head and sustained a laceration.  EMS placed a towel wrap around the patient's neck and started on IV, administering 200 mcg of fentanyl prior to arrival.  Patient denies any known loss of consciousness however she cannot member details about the accident.  She remembers screaming for her sister who lives in [place].  She was in her normal state of health prior to the fall.     Past Medical History:  Diagnosis Date   Anxiety    Arthritis    Carpal tunnel syndrome    Cataract    Depression    Diabetes mellitus without complication [facility])    Type 2   Fall April 10, 2015   GERD (gastroesophageal reflux disease)    Goiter    History of shingles    On abdomen, left ring finger, and left leg; Pt takes Valtrex   Hyperlipidemia    Hypertension    Obesity    PTSD (post-traumatic stress disorder)    Shortness of breath dyspnea    pt stated its related to the back problems she has   Thyroid disease    Ulcer     Patient Active Problem List   Diagnosis Date Noted   Acute pyelonephritis 10/01/2018   Acute diverticulitis 10/01/2018   Primary osteoarthritis of both hands 04/26/2017   History of vertebral fracture s/p kyphoplasty  04/26/2017   Hyperthyroidism 09/28/2013   Multinodular goiter  09/28/2013   Rash and nonspecific skin eruption 01/31/2013   Rash and other nonspecific skin eruption 10/16/2010   DIAB W/OTH MANIFESTS TYPE II/UNS TYPE UNCNTRL 04/10/2010   OSTEOPENIA 08/27/2008   UNS ADVRS EFF UNS RX MEDICINAL&BIOLOGICAL SBSTNC 09/18/2007   CARPAL TUNNEL SYNDROME 03/04/2007   ACTINIC KERATOSIS 03/04/2007   DEPRESSION 02/13/2007   HYPERLIPIDEMIA 12/25/2006   OBESITY 12/25/2006   HYPERTENSION 12/25/2006    Past Surgical History:  Procedure Laterality Date   ABDOMINAL HYSTERECTOMY  1996   CATARACT EXTRACTION, BILATERAL     CESAREAN [place]   COLONOSCOPY W/ POLYPECTOMY     KYPHOPLASTY N/A 06/02/2015   Procedure: KYPHOPLASTY;  Surgeon: Phylliss Bob, MD;  Location: [facility];  Service: Orthopedics;  Laterality: N/A;  Thoracic 3, 8, 10 kyphoplasty     OB History   No obstetric history on file.      Home Medications    Prior to Admission medications   Medication Sig Start Date End Date Taking? Authorizing Provider  albuterol (PROVENTIL) (2.5 MG/3ML) 0.083% nebulizer solution Take 2.5 mg by nebulization every 4 (four) hours as needed for wheezing or shortness of breath.  02/27/17   [provider]  buPROPion (WELLBUTRIN XL) 150 MG 24 hr tablet Take 150 mg by mouth  daily.    [provider]  cefdinir (OMNICEF) 300 MG capsule Take 1 capsule (300 mg total) by mouth every 12 (twelve) hours. completes course for pyelonephritis 10/04/18   Patrecia Pour, MD  cycloSPORINE (RESTASIS) 0.05 % ophthalmic emulsion Place 1 drop into both eyes 2 (two) times daily.    [provider]  furosemide (LASIX) 20 MG tablet Take 20 mg by mouth daily.    [provider]  Ibuprofen-diphenhydrAMINE Cit (MOTRIN PM) 200-38 MG TABS Take 1 tablet by mouth at bedtime as needed (sleep).    [provider]  levothyroxine (SYNTHROID, LEVOTHROID) 25 MCG tablet Take 25 mcg by mouth daily before breakfast.    [provider]  metFORMIN (GLUCOPHAGE-XR) 500 MG 24 hr tablet Take 500 mg by mouth every evening.    [provider]  metroNIDAZOLE (FLAGYL) 500 MG tablet Take 1 tablet (500 mg total) by mouth every 8 (eight) hours. completes course for diverticulitis 10/04/18   Patrecia Pour, MD  ondansetron (ZOFRAN-ODT) 4 MG disintegrating tablet Take 1 tablet (4 mg total) by mouth every 8 (eight) hours as needed for nausea or vomiting. 10/04/18   Patrecia Pour, MD  pantoprazole (PROTONIX) 40 MG tablet Take 40 mg by mouth daily.    [provider]  pravastatin (PRAVACHOL) 10 MG tablet Take 10 mg by mouth daily.    [provider]  promethazine (PHENERGAN) 12.5 MG tablet Take 0.5-1 tablets (6.25-12.5 mg total) by mouth every 8 (eight) hours as needed for refractory nausea / vomiting. 10/04/18   Patrecia Pour, MD  sertraline (ZOLOFT) 100 MG tablet Take 150 mg by mouth daily.     [provider]  simethicone (MYLICON) 80 MG chewable tablet Chew 80 mg by mouth every 6 (six) hours as needed for flatulence.    [provider]  valACYclovir (VALTREX) 500 MG tablet Take 1 tablet (500 mg total) by mouth daily. 04/20/13   Ricard Dillon, MD  Vitamin D, Ergocalciferol, (DRISDOL) 1.25 MG (50000 UT) CAPS capsule 1 CAPSULE ONCE A WEEK X 8 WEEKS 04/09/19   [provider]    Family History Family History  Problem Relation Age of Onset   COPD Mother    Depression Mother    Hypertension Mother    Hyperlipidemia Mother    Breast cancer Mother 2   Alzheimer's disease Father    Heart disease Father    Breast cancer Sister 84   Colon cancer Neg Hx     Social History Social History   Tobacco Use   Smoking status: Former Smoker    Packs/day: 0.50    Years: 4.00    Pack years: 2.00    Types: Cigarettes    Quit date: 06/28/2016    Years since quitting: 3.0   Smokeless tobacco: Never Used  Substance Use Topics   Alcohol use: Yes    Comment: rare glass of wine  (holidays only)   Drug use: No     Allergies   Other   Review of Systems Review of Systems  Constitutional: Negative for fatigue.  HENT: Negative for tinnitus.   Eyes: Negative for photophobia, pain and visual disturbance.  Respiratory: Negative for shortness of breath.   Cardiovascular: Negative for chest pain.  Gastrointestinal: Negative for nausea and vomiting.  Musculoskeletal: Positive for arthralgias, back pain, gait problem and joint swelling. Negative for neck pain.  Skin: Positive for wound.  Neurological: Negative for dizziness, weakness, light-headedness, numbness and headaches.  Psychiatric/Behavioral: Negative for confusion and decreased concentration.     Physical Exam Updated Vital Signs BP (!) 149/81    Pulse 99    Temp 97.9 F (36.6 C) (Oral)    Resp 18    SpO2 96%   Physical Exam Vitals signs and nursing note reviewed.  Constitutional:      Appearance: She is well-developed.  HENT:     Head: Normocephalic. No raccoon eyes or Battle's sign.     Comments: 0.5 cm laceration to occiput.  Wound is shallow, hemostatic, nongaping.    Right Ear: Tympanic membrane, ear canal and external ear normal. No hemotympanum.     Left Ear: Tympanic membrane, ear canal and external ear normal. No hemotympanum.     Nose: Nose normal.     Mouth/Throat:     Pharynx: Uvula midline.  Eyes:     General: Lids are normal.     Extraocular Movements:     Right eye: No nystagmus.     Left eye: No nystagmus.     Conjunctiva/sclera: Conjunctivae normal.     Pupils: Pupils are equal, round, and reactive to light.     Comments: No visible hyphema noted  Neck:     Musculoskeletal: Normal range of motion and neck supple.  Cardiovascular:     Rate and Rhythm: Normal rate and regular rhythm.  Pulmonary:     Effort: Pulmonary effort is normal.     Breath sounds: Normal breath sounds.  Abdominal:     Palpations: Abdomen is soft.     Tenderness: There is no abdominal tenderness.    Musculoskeletal:        General: Swelling and tenderness present.     Right shoulder: She exhibits tenderness. She exhibits normal range of motion and no bony tenderness.     Left shoulder: She exhibits tenderness. She exhibits normal range of motion and no bony tenderness.     Right hip: She exhibits tenderness. She exhibits normal range of motion and normal strength.     Left hip: Normal.     Right knee: She exhibits normal range of motion and no swelling. Tenderness found.     Left knee: Normal.     Right ankle: She exhibits decreased range of motion and swelling. Tenderness. Lateral malleolus tenderness found.     Left ankle: Normal.     Cervical back: She exhibits tenderness. She exhibits normal range of motion and no bony tenderness.     Thoracic back: She exhibits tenderness. She exhibits no bony tenderness.     Lumbar back: She exhibits tenderness. She exhibits no bony tenderness.     Right upper leg: Normal.     Right lower leg: Normal.     Right foot: Tenderness and bony tenderness present.       Feet:  Skin:    General: Skin is warm and dry.  Neurological:     Mental Status: She is alert and oriented to person, place, and time.     GCS: GCS eye subscore is 4. GCS verbal subscore is 5. GCS motor subscore is 6.     Cranial Nerves: No cranial nerve deficit.     Sensory: No sensory deficit.     Coordination: Coordination normal.     Deep Tendon Reflexes: Reflexes are normal and symmetric.      ED Treatments / Results  Labs (all labs ordered are listed, but only abnormal results are displayed) Labs Reviewed - No data to display  EKG  None  Radiology Dg Ankle Complete Right  Result Date: 07/11/2019 CLINICAL DATA:  Fall.  Ankle swelling. EXAM: RIGHT ANKLE - COMPLETE 3+ VIEW COMPARISON:  None. FINDINGS: Significant soft tissue swelling, particularly laterally. The fibula is intact. Mild irregularity of the medial malleolus may be from an old avulsion injury. No acute  tibial fracture noted. There is a comminuted fracture of the calcaneus best appreciated on the lateral view. No significant loss of calcaneal height is identified. The talus is intact. IMPRESSION: 1. There is a comminuted fracture of the calcaneus with displaced fragments. No significant loss of height based on the lateral view. Recommend CT imaging for better evaluation. 2. No other definite fractures are seen. Electronically Signed   By: Dorise Bullion III M.D   On: 07/11/2019 15:39   Ct Head Wo Contrast  Result Date: 07/11/2019 CLINICAL DATA:  68 year old who fell from a broken deck earlier today, falling approximately 3 feet. Patient denies loss of consciousness. Initial encounter. EXAM: CT HEAD WITHOUT CONTRAST CT CERVICAL SPINE WITHOUT CONTRAST TECHNIQUE: Multidetector CT imaging of the head and cervical spine was performed following the standard protocol without intravenous contrast. Multiplanar CT image reconstructions of the cervical spine were also generated. COMPARISON:  None. FINDINGS: CT HEAD FINDINGS Brain: Ventricular system normal in size and appearance for age. No mass lesion. No midline shift. No acute hemorrhage or hematoma. No extra-axial fluid collections. No evidence of acute infarction. Vascular: Minimal BILATERAL carotid siphon atherosclerosis. No hyperdense vessel. Skull: No skull fracture or other focal osseous abnormality involving the skull. Sinuses/Orbits: Visualized paranasal sinuses, bilateral mastoid air cells and bilateral middle ear cavities well-aerated. Visualized orbits and globes normal in appearance. Other: None. CT CERVICAL SPINE FINDINGS Alignment: Slight reversal of the usual cervical lordosis centered at C4-5. Anatomic posterior alignment. Facet joints anatomically aligned with diffuse degenerative changes. Skull base and vertebrae: No fractures identified involving the cervical spine. Coronal reformatted images demonstrate an intact craniocervical junction, intact  dens and intact lateral masses throughout. Soft tissues and spinal canal: No evidence of paraspinous or spinal canal hematoma. No evidence of spinal stenosis. Disc levels: Moderate disc space changes narrowing at C5-6 and endplate hypertrophic. Remaining disc spaces well-preserved. Degenerative changes at C1-C2. Facet hypertrophy accounts for mild LEFT foraminal stenosis at C3-4. Remaining neural foramina widely patent. Upper chest: Visualized lung apices clear. Mild atherosclerosis involving the proximal LEFT subclavian artery. Visualized superior mediastinum otherwise unremarkable. Other: Partially calcified nodules involving both lobes of the thyroid gland, the largest measuring approximately 1.6 cm in the RIGHT lobe. IMPRESSION: 1. Normal intracranially. 2. No cervical spine fractures identified. 3. Slight reversal of the usual cervical lordosis centered at C4-5. 4. Degenerative disc disease and spondylosis at C5-6 and C6-7. 5. Partially calcified nodules involving both lobes of the thyroid gland, the largest measuring approximately 1.6 cm in the RIGHT lobe. Further evaluation with non-emergent thyroid ultrasound is recommended. Reference: Recommendations for f/u of Incidental Thyroid Nodules (ITN) found on CT, MR, NM and Extrathyroidal [place] are based upon the ACR white paper and [facility] 3-tiered system for managing ITNs: J Am Coll Radiol. 2015 Feb;12(2): 143-50. Electronically Signed   By: Evangeline Dakin M.D.   On: 07/11/2019 14:55   Ct Cervical Spine Wo Contrast  Result Date: 07/11/2019 CLINICAL DATA:  69 year old who fell from a broken deck earlier today, falling approximately 3 feet. Patient denies loss of consciousness. Initial encounter. EXAM: CT HEAD WITHOUT CONTRAST CT CERVICAL SPINE WITHOUT CONTRAST TECHNIQUE: Multidetector CT imaging of the head and cervical  spine was performed following the standard protocol without intravenous contrast. Multiplanar CT image reconstructions of the cervical spine  were also generated. COMPARISON:  None. FINDINGS: CT HEAD FINDINGS Brain: Ventricular system normal in size and appearance for age. No mass lesion. No midline shift. No acute hemorrhage or hematoma. No extra-axial fluid collections. No evidence of acute infarction. Vascular: Minimal BILATERAL carotid siphon atherosclerosis. No hyperdense vessel. Skull: No skull fracture or other focal osseous abnormality involving the skull. Sinuses/Orbits: Visualized paranasal sinuses, bilateral mastoid air cells and bilateral middle ear cavities well-aerated. Visualized orbits and globes normal in appearance. Other: None. CT CERVICAL SPINE FINDINGS Alignment: Slight reversal of the usual cervical lordosis centered at C4-5. Anatomic posterior alignment. Facet joints anatomically aligned with diffuse degenerative changes. Skull base and vertebrae: No fractures identified involving the cervical spine. Coronal reformatted images demonstrate an intact craniocervical junction, intact dens and intact lateral masses throughout. Soft tissues and spinal canal: No evidence of paraspinous or spinal canal hematoma. No evidence of spinal stenosis. Disc levels: Moderate disc space changes narrowing at C5-6 and endplate hypertrophic. Remaining disc spaces well-preserved. Degenerative changes at C1-C2. Facet hypertrophy accounts for mild LEFT foraminal stenosis at C3-4. Remaining neural foramina widely patent. Upper chest: Visualized lung apices clear. Mild atherosclerosis involving the proximal LEFT subclavian artery. Visualized superior mediastinum otherwise unremarkable. Other: Partially calcified nodules involving both lobes of the thyroid gland, the largest measuring approximately 1.6 cm in the RIGHT lobe. IMPRESSION: 1. Normal intracranially. 2. No cervical spine fractures identified. 3. Slight reversal of the usual cervical lordosis centered at C4-5. 4. Degenerative disc disease and spondylosis at C5-6 and C6-7. 5. Partially calcified  nodules involving both lobes of the thyroid gland, the largest measuring approximately 1.6 cm in the RIGHT lobe. Further evaluation with non-emergent thyroid ultrasound is recommended. Reference: Recommendations for f/u of Incidental Thyroid Nodules (ITN) found on CT, MR, NM and Extrathyroidal [place] are based upon the ACR white paper and [facility] 3-tiered system for managing ITNs: J Am Coll Radiol. 2015 Feb;12(2): 143-50. Electronically Signed   By: Evangeline Dakin M.D.   On: 07/11/2019 14:55   Ct Foot Right Wo Contrast  Result Date: 07/11/2019 CLINICAL DATA:  The patient suffered a 3 foot fall through a broken deck today resulting in a right calcaneal fracture. Initial encounter. EXAM: CT OF THE RIGHT FOOT WITHOUT CONTRAST TECHNIQUE: Multidetector CT imaging of the right foot was performed according to the standard protocol. Multiplanar CT image reconstructions were also generated. COMPARISON:  Plain films right ankle today. FINDINGS: Bones/Joint/Cartilage As seen on the comparison plain films, the patient has an acute fracture of the calcaneus. Main fracture line is oblique in orientation extending from the superior aspect of the calcaneus just posterior to the subtalar joint in an anterior and lateral orientation through the periphery of the calcaneocuboid joint. The fracture is mildly comminuted but only minimally displaced. The fracture does not involve the subtalar joint. No other fracture is identified. Ligaments Suboptimally assessed by CT. Major ligamentous structures appear intact. There is no widening of the syndesmosis. Muscles and Tendons Intact.  No tendon entrapment is seen. Soft tissues There is some soft tissue swelling and hematoma about the ankle. IMPRESSION: Mildly comminuted fracture of the calcaneus extends to the lateral periphery of the calcaneus at calcaneocuboid joint. The fracture does not disrupt the articular surfaces of the subtalar joint. Negative for tendon entrapment. Electronically  Signed   By: Inge Rise M.D.   On: 07/11/2019 16:24   Dg Knee Complete  4 Views Right  Result Date: 07/11/2019 CLINICAL DATA:  Pain after fall EXAM: RIGHT KNEE - COMPLETE 4+ VIEW COMPARISON:  None. FINDINGS: No evidence of fracture, dislocation, or joint effusion. No evidence of arthropathy or other focal bone abnormality. Soft tissues are unremarkable. IMPRESSION: Negative. Electronically Signed   By: Dorise Bullion III M.D   On: 07/11/2019 15:40    Procedures Procedures (including critical care time)  Medications Ordered in ED Medications  HYDROmorphone (DILAUDID) injection 0.5 mg (0.5 mg Intravenous Given 07/11/19 1410)  ondansetron (ZOFRAN) injection 4 mg (4 mg Intravenous Given 07/11/19 1410)  HYDROmorphone (DILAUDID) injection 0.5 mg (0.5 mg Intravenous Given 07/11/19 1625)  HYDROmorphone (DILAUDID) injection 0.5 mg (0.5 mg Intravenous Given 07/11/19 1800)  oxyCODONE-acetaminophen (PERCOCET/ROXICET) 5-325 MG per tablet 1 tablet (1 tablet Oral Given 07/11/19 1800)     Initial Impression / Assessment and Plan / ED Course  I have reviewed the triage vital signs and the nursing notes.  Pertinent labs & imaging results that were available during my care of the patient were reviewed by me and considered in my medical decision making (see chart for details).        Patient seen and examined. Work-up initiated. Medications ordered.  Will image head and neck, knee and ankle.  Will clean wound and assess need for repair.  Vital signs reviewed and are as follows: BP (!) 149/81    Pulse 99    Temp 97.9 F (36.6 C) (Oral)    Resp 18    SpO2 96%   Plain film imaging demonstrates calcaneal fracture.  CT recommended.  Knee looks okay.  CT imaging of the head and neck without signs of fracture or acute injuries.  Patient updated.  Additional pain medication ordered.  Wound was well cleaned prior to my exam.  This is small and nongaping.  Discussed with patient and family utility of  stapling versus just good wound care.  They do not want any wound care given that it is small and approximated.  I think this is reasonable.  CT performed of the calcaneus with findings as above.  I touched base with Dr. Lynann Bologna of [facility].  He recommends that the patient be splinted, discharged home with pain control and call office on Monday.  Dr. Lucia Gaskins is the physician who she will need to see as an outpatient.  Discussed plan with patient and family.  Family has several types of walkers and wheelchairs for the patient to use.  Patient has multiple family members who can assist her and help take care of her.  Patient will be placed in a short leg splint.  She will be given a dose of Dilaudid prior to pulling her IV.  She will be given a dose of oral Percocet.  Prescription sent to [facility].  Follow-up referrals given.  Encouraged rice protocol especially maintaining elevation, nonweightbearing.  Discussed use of lowest dose of narcotic pain medication discussed side effects including risk of falls while taking this medication.  Patient does take Xanax and tramadol and will avoid taking Percocet while taking these medications.   Final Clinical Impressions(s) / ED Diagnoses   Final diagnoses:  Closed displaced fracture of right calcaneus, unspecified portion of calcaneus, initial encounter  Laceration of scalp, initial encounter  Musculoskeletal pain   Patient with injuries after a fall through a deck.  Patient did hit her head and sustained a small minor scalp laceration that does not require repair.  Head CT and neck CT are  negative.  Patient has generalized pain of her body and upper extremities with good and preserved range of motion.  She had knee pain and a negative knee x-ray.  She has a comminuted mildly displaced closed right calcaneus fracture.  Patient has excellent care at home and has a wheelchair that she can use until she can follow-up with orthopedics next week.  She will be  discharged home with pain medicine, instructed on use as above.  No signs of compartment syndrome.  Lower extremity appears neurovascularly intact but with swelling.   ED Discharge Orders         Ordered    oxyCODONE-acetaminophen (PERCOCET/ROXICET) 5-325 MG tablet  Every 6 hours PRN     07/11/19 1821           Carlisle Cater, PA-C 07/11/19 1825    Julianne Rice, MD 07/11/19 2139

## 2019-07-12 ENCOUNTER — Emergency Department (HOSPITAL_COMMUNITY): Payer: Medicare Other

## 2019-07-12 ENCOUNTER — Inpatient Hospital Stay (HOSPITAL_COMMUNITY)
Admission: EM | Admit: 2019-07-12 | Discharge: 2019-07-18 | DRG: 563 | Disposition: A | Discharge status: Skilled Nursing Facility | Payer: Medicare Other | Attending: Internal Medicine | Admitting: Internal Medicine

## 2019-07-12 ENCOUNTER — Other Ambulatory Visit: Payer: Self-pay

## 2019-07-12 ENCOUNTER — Encounter (HOSPITAL_COMMUNITY): Payer: Self-pay

## 2019-07-12 DIAGNOSIS — Z803 Family history of malignant neoplasm of breast: Secondary | ICD-10-CM

## 2019-07-12 DIAGNOSIS — M549 Dorsalgia, unspecified: Secondary | ICD-10-CM

## 2019-07-12 DIAGNOSIS — S92009A Unspecified fracture of unspecified calcaneus, initial encounter for closed fracture: Secondary | ICD-10-CM | POA: Diagnosis present

## 2019-07-12 DIAGNOSIS — M25561 Pain in right knee: Secondary | ICD-10-CM | POA: Diagnosis present

## 2019-07-12 DIAGNOSIS — I952 Hypotension due to drugs: Secondary | ICD-10-CM | POA: Diagnosis present

## 2019-07-12 DIAGNOSIS — Z79890 Hormone replacement therapy (postmenopausal): Secondary | ICD-10-CM

## 2019-07-12 DIAGNOSIS — T3995XA Adverse effect of unspecified nonopioid analgesic, antipyretic and antirheumatic, initial encounter: Secondary | ICD-10-CM | POA: Diagnosis not present

## 2019-07-12 DIAGNOSIS — Z20828 Contact with and (suspected) exposure to other viral communicable diseases: Secondary | ICD-10-CM | POA: Diagnosis present

## 2019-07-12 DIAGNOSIS — R296 Repeated falls: Secondary | ICD-10-CM | POA: Diagnosis present

## 2019-07-12 DIAGNOSIS — Z825 Family history of asthma and other chronic lower respiratory diseases: Secondary | ICD-10-CM

## 2019-07-12 DIAGNOSIS — D649 Anemia, unspecified: Secondary | ICD-10-CM | POA: Diagnosis present

## 2019-07-12 DIAGNOSIS — E669 Obesity, unspecified: Secondary | ICD-10-CM | POA: Diagnosis present

## 2019-07-12 DIAGNOSIS — Z820 Family history of epilepsy and other diseases of the nervous system: Secondary | ICD-10-CM

## 2019-07-12 DIAGNOSIS — S92001D Unspecified fracture of right calcaneus, subsequent encounter for fracture with routine healing: Secondary | ICD-10-CM

## 2019-07-12 DIAGNOSIS — M25551 Pain in right hip: Secondary | ICD-10-CM | POA: Diagnosis present

## 2019-07-12 DIAGNOSIS — Z79899 Other long term (current) drug therapy: Secondary | ICD-10-CM

## 2019-07-12 DIAGNOSIS — E785 Hyperlipidemia, unspecified: Secondary | ICD-10-CM | POA: Diagnosis present

## 2019-07-12 DIAGNOSIS — Z6834 Body mass index (BMI) 34.0-34.9, adult: Secondary | ICD-10-CM

## 2019-07-12 DIAGNOSIS — K219 Gastro-esophageal reflux disease without esophagitis: Secondary | ICD-10-CM | POA: Diagnosis present

## 2019-07-12 DIAGNOSIS — S92001A Unspecified fracture of right calcaneus, initial encounter for closed fracture: Secondary | ICD-10-CM | POA: Diagnosis present

## 2019-07-12 DIAGNOSIS — F329 Major depressive disorder, single episode, unspecified: Secondary | ICD-10-CM | POA: Diagnosis present

## 2019-07-12 DIAGNOSIS — W133XXA Fall through floor, initial encounter: Secondary | ICD-10-CM | POA: Diagnosis present

## 2019-07-12 DIAGNOSIS — E039 Hypothyroidism, unspecified: Secondary | ICD-10-CM | POA: Diagnosis present

## 2019-07-12 DIAGNOSIS — S0101XA Laceration without foreign body of scalp, initial encounter: Secondary | ICD-10-CM | POA: Diagnosis present

## 2019-07-12 DIAGNOSIS — Z8349 Family history of other endocrine, nutritional and metabolic diseases: Secondary | ICD-10-CM

## 2019-07-12 DIAGNOSIS — E1169 Type 2 diabetes mellitus with other specified complication: Secondary | ICD-10-CM | POA: Diagnosis present

## 2019-07-12 DIAGNOSIS — I10 Essential (primary) hypertension: Secondary | ICD-10-CM | POA: Diagnosis present

## 2019-07-12 DIAGNOSIS — W19XXXA Unspecified fall, initial encounter: Secondary | ICD-10-CM | POA: Diagnosis present

## 2019-07-12 DIAGNOSIS — E119 Type 2 diabetes mellitus without complications: Secondary | ICD-10-CM | POA: Diagnosis present

## 2019-07-12 DIAGNOSIS — G8929 Other chronic pain: Secondary | ICD-10-CM | POA: Diagnosis present

## 2019-07-12 DIAGNOSIS — Z7984 Long term (current) use of oral hypoglycemic drugs: Secondary | ICD-10-CM

## 2019-07-12 DIAGNOSIS — Z8249 Family history of ischemic heart disease and other diseases of the circulatory system: Secondary | ICD-10-CM

## 2019-07-12 LAB — COMPREHENSIVE METABOLIC PANEL
ALT: 16 U/L (ref 0–44)
AST: 24 U/L (ref 15–41)
Albumin: 3.9 g/dL (ref 3.5–5.0)
Alkaline Phosphatase: 75 U/L (ref 38–126)
Anion gap: 10 (ref 5–15)
BUN: 11 mg/dL (ref 8–23)
CO2: 25 mmol/L (ref 22–32)
Calcium: 8.7 mg/dL — ABNORMAL LOW (ref 8.9–10.3)
Chloride: 105 mmol/L (ref 98–111)
Creatinine, Ser: 1.01 mg/dL — ABNORMAL HIGH (ref 0.44–1.00)
GFR calc Af Amer: >60 mL/min (ref >60)
GFR calc non Af Amer: 57 mL/min — ABNORMAL LOW (ref >60)
Glucose, Bld: 115 mg/dL — ABNORMAL HIGH (ref 70–99)
Potassium: 3.4 mmol/L — ABNORMAL LOW (ref 3.5–5.1)
Sodium: 140 mmol/L (ref 135–145)
Total Bilirubin: 1.3 mg/dL — ABNORMAL HIGH (ref 0.3–1.2)
Total Protein: 7 g/dL (ref 6.5–8.1)

## 2019-07-12 LAB — CBC WITH DIFFERENTIAL/PLATELET
Abs Immature Granulocytes: 0.03 10*3/uL (ref 0.00–0.07)
Basophils Absolute: 0.1 10*3/uL (ref 0.0–0.1)
Basophils Relative: 1 %
Eosinophils Absolute: 0.1 10*3/uL (ref 0.0–0.5)
Eosinophils Relative: 1 %
HCT: 36.5 % (ref 36.0–46.0)
Hemoglobin: 11.3 g/dL — ABNORMAL LOW (ref 12.0–15.0)
Immature Granulocytes: 0 %
Lymphocytes Relative: 21 %
Lymphs Abs: 2.2 10*3/uL (ref 0.7–4.0)
MCH: 27.6 pg (ref 26.0–34.0)
MCHC: 31 g/dL (ref 30.0–36.0)
MCV: 89 fL (ref 80.0–100.0)
Monocytes Absolute: 0.8 10*3/uL (ref 0.1–1.0)
Monocytes Relative: 7 %
Neutro Abs: 7.5 10*3/uL (ref 1.7–7.7)
Neutrophils Relative %: 70 %
Platelets: 256 10*3/uL (ref 150–400)
RBC: 4.1 MIL/uL (ref 3.87–5.11)
RDW: 13.2 % (ref 11.5–15.5)
WBC: 10.7 10*3/uL — ABNORMAL HIGH (ref 4.0–10.5)
nRBC: 0 % (ref 0.0–0.2)

## 2019-07-12 MED ORDER — IOHEXOL 300 MG/ML  SOLN
100.0000 mL | Freq: Once | INTRAMUSCULAR | Status: AC | PRN
  Administered 2019-07-12: 100 mL via INTRAVENOUS

## 2019-07-12 MED ORDER — FENTANYL CITRATE (PF) 100 MCG/2ML IJ SOLN
50.0000 ug | Freq: Once | INTRAMUSCULAR | Status: AC
  Administered 2019-07-12: 50 ug via INTRAVENOUS
  Filled 2019-07-12: qty 2

## 2019-07-12 MED ORDER — OXYCODONE-ACETAMINOPHEN 5-325 MG PO TABS
1.0000 | ORAL_TABLET | Freq: Once | ORAL | Status: AC
  Administered 2019-07-12: 1 via ORAL
  Filled 2019-07-12: qty 1

## 2019-07-12 MED ORDER — SODIUM CHLORIDE (PF) 0.9 % IJ SOLN
INTRAMUSCULAR | Status: AC
  Filled 2019-07-12: qty 50

## 2019-07-12 NOTE — ED Provider Notes (Signed)
68 year old female received at signout from Dr. Roslynn Amble pending imaging.  Please see his note for further work-up and medical decision making.  "KENADEE GATES is a 68 y.o. female.  Presents emergency department with multiple complaints after fall.  Yesterday patient fell through a broken back, suffered a right calcaneal fracture.  Later in the evening after going home her husband slipped while pulling her up in a wheelchair and she fell out falling down multiple steps landing on her back.  Patient reports she may have hit her head but cannot recall.  Denies loss of consciousness.  Thought she could push through pain but throughout the day today her pain had been worsening and she wanted to be checked out.  States that she has pain in her neck, back, right foot.  Pain is sharp, shooting, stabbing, severe, worse with certain movements.  Worsen back."  Physical Exam  BP (!) 99/45   Pulse 79   Temp 99 F (37.2 C) (Oral)   Resp 14   Ht 5\' 2"  (1.575 m)   Wt 81.6 kg   SpO2 90%   BMI 32.92 kg/m   Physical Exam Vitals signs and nursing note reviewed.  Constitutional:      Appearance: She is well-developed.     Comments: Appears uncomfortable  HENT:     Head: Normocephalic.  Neck:     Musculoskeletal: Normal range of motion.  Skin:    Comments: Bruising noted to the bilateral upper arms.  Neurological:     Mental Status: She is alert and oriented to person, place, and time.    Right lower extremity       There is considerable swelling and bruising diffusely to the right ankle and foot.  Muscular compartments are not firm.  Good capillary refill of the digits.  She has palpable DP pulses with Doppler.  Independently moves the digits of the right foot.  Decreased range of motion of the right ankle secondary to pain.  ED Course/Procedures     Procedures  MDM   68 year old female received at signout from Dr. Roslynn Amble pending imaging.  In brief, the patient was seen in the ER  yesterday after she fell approximately 3 feet through a deck.  She was found to have a right calcaneal fracture.  Orthopedics was consulted and she was placed in a right short leg splint and was advised to follow-up in the office on Monday.  Staff felt that she was safe for discharge to home as she had multiple devices to assist with ambulation as well as multiple family members who were able to assist the patient.  Unfortunately, when she went home, family was assisting her with pulling her up into a wheelchair while going up the steps.  She ended up falling out of the wheelchair and fell down multiple steps landing on her back.  Reports that she has approximately 10 stairs to get into her house.  She does have a chair assist, but is unable to put any weight on her right leg.  Imaging in the ER today is unremarkable for acute injuries.  On exam, the patient does have worsening bruising and swelling to her right ankle and foot.  The short leg splint was removed by staff as the patient was complaining of worsening pain and swelling to the area.  We will have this replaced in the ER.  Her pain has been uncontrolled since she was initially seen in the ER yesterday despite taking Percocet at home.  The patient was previously taking tramadol for chronic pain, but was advised to stop the medication since she was given a new prescription of Percocet.  Dilaudid given in the ER.  Given that the patient is unable to ambulate and already had a second fall, the patient and family at bedside feels strongly that she is not safe to return home at this time, especially since the patient should be nonweightbearing.  We will consult the hospitalist team for admission for pain control and she will likely need PT OT assessment.  Consult the hospitalist team and Dr. Hal Hope will admit.  COVID-19 testing is pending.  The patient appears reasonably stabilized for admission considering the current resources, flow, and capabilities  available in the ED at this time, and I doubt any other [facility] requiring further screening and/or treatment in the ED prior to admission.       Joanne Gavel, PA-C 07/13/19 0124    Palumbo, April, MD 07/13/19 0130

## 2019-07-12 NOTE — ED Provider Notes (Signed)
[place] DEPT Provider Note   CSN: [id number] Arrival date & time: 07/12/19  1855     History   Chief Complaint Chief Complaint  Patient presents with   Fall   Foot Pain    HPI Jenny Cardenas is a 68 y.o. female.  Presents emergency department with multiple complaints after fall.  Yesterday patient fell through a broken back, suffered a right calcaneal fracture.  Later in the evening after going home her husband slipped while pulling her up in a wheelchair and she fell out falling down multiple steps landing on her back.  Patient reports she may have hit her head but cannot recall.  Denies loss of consciousness.  Thought she could push through pain but throughout the day today her pain had been worsening and she wanted to be checked out.  States that she has pain in her neck, back, right foot.  Pain is sharp, shooting, stabbing, severe, worse with certain movements.  Worsen back.     HPI  Past Medical History:  Diagnosis Date   Anxiety    Arthritis    Carpal tunnel syndrome    Cataract    Depression    Diabetes mellitus without complication [facility])    Type 2   Fall April 10, 2015   GERD (gastroesophageal reflux disease)    Goiter    History of shingles    On abdomen, left ring finger, and left leg; Pt takes Valtrex   Hyperlipidemia    Hypertension    Obesity    PTSD (post-traumatic stress disorder)    Shortness of breath dyspnea    pt stated its related to the back problems she has   Thyroid disease    Ulcer     Patient Active Problem List   Diagnosis Date Noted   Acute pyelonephritis 10/01/2018   Acute diverticulitis 10/01/2018   Primary osteoarthritis of both hands 04/26/2017   History of vertebral fracture s/p kyphoplasty  04/26/2017   Hyperthyroidism 09/28/2013   Multinodular goiter 09/28/2013   Rash and nonspecific skin eruption 01/31/2013   Rash and other nonspecific skin eruption 10/16/2010    DIAB W/OTH MANIFESTS TYPE II/UNS TYPE UNCNTRL 04/10/2010   OSTEOPENIA 08/27/2008   UNS ADVRS EFF UNS RX MEDICINAL&BIOLOGICAL SBSTNC 09/18/2007   CARPAL TUNNEL SYNDROME 03/04/2007   ACTINIC KERATOSIS 03/04/2007   DEPRESSION 02/13/2007   HYPERLIPIDEMIA 12/25/2006   OBESITY 12/25/2006   HYPERTENSION 12/25/2006    Past Surgical History:  Procedure Laterality Date   ABDOMINAL HYSTERECTOMY  1996   CATARACT EXTRACTION, BILATERAL     CESAREAN [place]   COLONOSCOPY W/ POLYPECTOMY     KYPHOPLASTY N/A 06/02/2015   Procedure: KYPHOPLASTY;  Surgeon: Phylliss Bob, MD;  Location: [place];  Service: Orthopedics;  Laterality: N/A;  Thoracic 3, 8, 10 kyphoplasty     OB History   No obstetric history on file.      Home Medications    Prior to Admission medications   Medication Sig Start Date End Date Taking? Authorizing Provider  albuterol (PROVENTIL) (2.5 MG/3ML) 0.083% nebulizer solution Take 2.5 mg by nebulization every 4 (four) hours as needed for wheezing or shortness of breath.  02/27/17  Yes [provider]  ALPRAZolam Duanne Moron) 0.5 MG tablet Take 0.5 mg by mouth 3 (three) times daily as needed for anxiety. 07/08/19  Yes [provider]  amphetamine-dextroamphetamine (ADDERALL) 10 MG tablet Take 10 mg by mouth every morning. 06/05/19  Yes [provider]  buPROPion (WELLBUTRIN XL) 150 MG 24 hr tablet Take 150 mg by mouth daily.   Yes [provider]  Cyanocobalamin (VITAMIN B 12) 500 MCG TABS Take 500 mg by mouth once a week.   Yes [provider]  cycloSPORINE (RESTASIS) 0.05 % ophthalmic emulsion Place 1 drop into both eyes 2 (two) times daily.   Yes [provider]  furosemide (LASIX) 20 MG tablet Take 20 mg by mouth daily.   Yes [provider]  Ibuprofen-diphenhydrAMINE Cit (MOTRIN PM) 200-38 MG TABS Take 1 tablet by mouth at bedtime as needed (sleep).   Yes [provider]  ketoconazole  (NIZORAL) 2 % cream Apply 1 application topically daily as needed for irritation.  06/11/19  Yes [provider]  levothyroxine (SYNTHROID, LEVOTHROID) 25 MCG tablet Take 25 mcg by mouth daily before breakfast.   Yes [provider]  metFORMIN (GLUCOPHAGE-XR) 500 MG 24 hr tablet Take 500 mg by mouth every evening.   Yes [provider]  mupirocin ointment (BACTROBAN) 2 % Apply 1 application topically daily as needed (for mask dermatitis).  05/14/19  Yes [provider]  oxyCODONE-acetaminophen (PERCOCET/ROXICET) 5-325 MG tablet Take 1-2 tablets by mouth every 6 (six) hours as needed for moderate pain or severe pain. 07/11/19  Yes Carlisle Cater, PA-C  pantoprazole (PROTONIX) 40 MG tablet Take 40 mg by mouth daily.   Yes [provider]  pravastatin (PRAVACHOL) 10 MG tablet Take 10 mg by mouth daily.   Yes [provider]  sertraline (ZOLOFT) 100 MG tablet Take 150 mg by mouth daily. Take 150 mg (1 whole tablet and 1/2 tablet) by mouth once daily.   Yes [provider]  tiZANidine (ZANAFLEX) 4 MG tablet Take 4 mg by mouth at bedtime. 07/03/19  Yes [provider]  triamcinolone cream (KENALOG) 0.1 % Apply 1 application topically. 05/13/19  Yes [provider]  valACYclovir (VALTREX) 500 MG tablet Take 1 tablet (500 mg total) by mouth daily. 04/20/13  Yes Ricard Dillon, MD  Vitamin D, Ergocalciferol, (DRISDOL) 1.25 MG (50000 UT) CAPS capsule 1 CAPSULE ONCE A WEEK X 8 WEEKS 04/09/19  Yes [provider]    Family History Family History  Problem Relation Age of Onset   COPD Mother    Depression Mother    Hypertension Mother    Hyperlipidemia Mother    Breast cancer Mother 6   Alzheimer's disease Father    Heart disease Father    Breast cancer Sister 31   Colon cancer Neg Hx     Social History Social History   Tobacco Use   Smoking status: Former Smoker    Packs/day: 0.50    Years: 4.00     Pack years: 2.00    Types: Cigarettes    Quit date: 06/28/2016    Years since quitting: 3.0   Smokeless tobacco: Never Used  Substance Use Topics   Alcohol use: Yes    Comment: rare glass of wine (holidays only)   Drug use: No     Allergies   Other   Review of Systems Review of Systems  Constitutional: Negative for chills and fever.  HENT: Negative for ear pain and sore throat.   Eyes: Negative for pain and visual disturbance.  Respiratory: Negative for cough and shortness of breath.   Cardiovascular: Negative for chest pain and palpitations.  Gastrointestinal: Negative for abdominal pain and vomiting.  Genitourinary: Negative for dysuria and hematuria.  Musculoskeletal: Positive for arthralgias and back  pain.  Skin: Negative for color change and rash.  Neurological: Negative for seizures and syncope.  All other systems reviewed and are negative.    Physical Exam Updated Vital Signs BP (!) 146/53    Pulse 79    Temp 99 F (37.2 C) (Oral)    Resp 18    Ht 5\' 2"  (1.575 m)    Wt 81.6 kg    SpO2 97%    BMI 32.92 kg/m   Physical Exam Vitals signs and nursing note reviewed.  Constitutional:      General: She is not in acute distress.    Appearance: She is well-developed.  HENT:     Head: Normocephalic and atraumatic.  Eyes:     Conjunctiva/sclera: Conjunctivae normal.  Neck:     Musculoskeletal: Neck supple.     Comments: No deformity, there is some tenderness over her midline C-spine Cardiovascular:     Rate and Rhythm: Normal rate and regular rhythm.     Heart sounds: No murmur.  Pulmonary:     Effort: Pulmonary effort is normal. No respiratory distress.     Breath sounds: Normal breath sounds.  Abdominal:     Palpations: Abdomen is soft.     Tenderness: There is no abdominal tenderness.     Comments: Generalized TTP, no rebound or guarding, no significant echymosis  Musculoskeletal:     Comments: Back: Tenderness palpation over T, L-spine, some scattered  ecchymosis noted over right thoracic back, no large hematoma present, no crepitus RLE: short leg splint intact, TTP over right hip, no TTP over knee, sensation intact in toes, normal cap refill LLE: no TTP throughout extremity, no deformity RUE: no TTP throughout extremity LUE: no TTP throughout extremity  Skin:    General: Skin is warm and dry.     Capillary Refill: Capillary refill takes less than 2 seconds.  Neurological:     General: No focal deficit present.     Mental Status: She is alert and oriented to person, place, and time.      ED Treatments / Results  Labs (all labs ordered are listed, but only abnormal results are displayed) Labs Reviewed  CBC WITH DIFFERENTIAL/PLATELET - Abnormal; Notable for the following components:      Result Value   WBC 10.7 (*)    Hemoglobin 11.3 (*)    All other components within normal limits  COMPREHENSIVE METABOLIC PANEL - Abnormal; Notable for the following components:   Potassium 3.4 (*)    Glucose, Bld 115 (*)    Creatinine, Ser 1.01 (*)    Calcium 8.7 (*)    Total Bilirubin 1.3 (*)    GFR calc non Af Amer 57 (*)    All other components within normal limits  URINALYSIS, ROUTINE W REFLEX MICROSCOPIC    EKG None  Radiology Dg Tibia/fibula Right  Result Date: 07/12/2019 CLINICAL DATA:  68 year old female status post fall 3 broken deck yesterday with fractured right foot. Fall from wheelchair today. EXAM: RIGHT TIBIA AND FIBULA - 2 VIEW COMPARISON:  Right foot series today. Right foot CT yesterday. Right knee series 05/03/2017. FINDINGS: Comminuted fracture of the right calcaneus as reported yesterday. Mortise joint alignment appears preserved. Knee joint alignment appears preserved. On the cross-table lateral view there is suggestion of small right knee joint effusion. Patella appears stable and intact. No acute right tibia or fibula fracture identified. Sherald Barge move again noted. IMPRESSION: 1. No acute right tibia or fibula fracture  identified. Comminuted right calcaneus fracture as  reported yesterday. 2. Possible small right knee joint effusion. Electronically Signed   By: Genevie Ann M.D.   On: 07/12/2019 20:45   Dg Ankle Complete Right  Result Date: 07/11/2019 CLINICAL DATA:  Fall.  Ankle swelling. EXAM: RIGHT ANKLE - COMPLETE 3+ VIEW COMPARISON:  None. FINDINGS: Significant soft tissue swelling, particularly laterally. The fibula is intact. Mild irregularity of the medial malleolus may be from an old avulsion injury. No acute tibial fracture noted. There is a comminuted fracture of the calcaneus best appreciated on the lateral view. No significant loss of calcaneal height is identified. The talus is intact. IMPRESSION: 1. There is a comminuted fracture of the calcaneus with displaced fragments. No significant loss of height based on the lateral view. Recommend CT imaging for better evaluation. 2. No other definite fractures are seen. Electronically Signed   By: Dorise Bullion III M.D   On: 07/11/2019 15:39   Ct Head Wo Contrast  Result Date: 07/12/2019 CLINICAL DATA:  68 year old female status post fall yesterday and again today. EXAM: CT HEAD WITHOUT CONTRAST TECHNIQUE: Contiguous axial images were obtained from the base of the skull through the vertex without intravenous contrast. COMPARISON:  Head and cervical spine CT yesterday. FINDINGS: Brain: Cerebral volume is within normal limits for age. No midline shift, ventriculomegaly, mass effect, evidence of mass lesion, intracranial hemorrhage or evidence of cortically based acute infarction. Gray-white matter differentiation is within normal limits throughout the brain. Vascular: Mild Calcified atherosclerosis at the skull base. No suspicious intracranial vascular hyperdensity. Skull: Stable and intact. Sinuses/Orbits: Visualized paranasal sinuses and mastoids are stable and well pneumatized. Other: No acute orbit or scalp soft tissue finding. IMPRESSION: Stable and normal for age  non contrast CT appearance of the brain. Electronically Signed   By: Genevie Ann M.D.   On: 07/12/2019 21:13   Ct Head Wo Contrast  Result Date: 07/11/2019 CLINICAL DATA:  68 year old who fell from a broken deck earlier today, falling approximately 3 feet. Patient denies loss of consciousness. Initial encounter. EXAM: CT HEAD WITHOUT CONTRAST CT CERVICAL SPINE WITHOUT CONTRAST TECHNIQUE: Multidetector CT imaging of the head and cervical spine was performed following the standard protocol without intravenous contrast. Multiplanar CT image reconstructions of the cervical spine were also generated. COMPARISON:  None. FINDINGS: CT HEAD FINDINGS Brain: Ventricular system normal in size and appearance for age. No mass lesion. No midline shift. No acute hemorrhage or hematoma. No extra-axial fluid collections. No evidence of acute infarction. Vascular: Minimal BILATERAL carotid siphon atherosclerosis. No hyperdense vessel. Skull: No skull fracture or other focal osseous abnormality involving the skull. Sinuses/Orbits: Visualized paranasal sinuses, bilateral mastoid air cells and bilateral middle ear cavities well-aerated. Visualized orbits and globes normal in appearance. Other: None. CT CERVICAL SPINE FINDINGS Alignment: Slight reversal of the usual cervical lordosis centered at C4-5. Anatomic posterior alignment. Facet joints anatomically aligned with diffuse degenerative changes. Skull base and vertebrae: No fractures identified involving the cervical spine. Coronal reformatted images demonstrate an intact craniocervical junction, intact dens and intact lateral masses throughout. Soft tissues and spinal canal: No evidence of paraspinous or spinal canal hematoma. No evidence of spinal stenosis. Disc levels: Moderate disc space changes narrowing at C5-6 and endplate hypertrophic. Remaining disc spaces well-preserved. Degenerative changes at C1-C2. Facet hypertrophy accounts for mild LEFT foraminal stenosis at C3-4.  Remaining neural foramina widely patent. Upper chest: Visualized lung apices clear. Mild atherosclerosis involving the proximal LEFT subclavian artery. Visualized superior mediastinum otherwise unremarkable. Other: Partially calcified nodules involving both lobes of  the thyroid gland, the largest measuring approximately 1.6 cm in the RIGHT lobe. IMPRESSION: 1. Normal intracranially. 2. No cervical spine fractures identified. 3. Slight reversal of the usual cervical lordosis centered at C4-5. 4. Degenerative disc disease and spondylosis at C5-6 and C6-7. 5. Partially calcified nodules involving both lobes of the thyroid gland, the largest measuring approximately 1.6 cm in the RIGHT lobe. Further evaluation with non-emergent thyroid ultrasound is recommended. Reference: Recommendations for f/u of Incidental Thyroid Nodules (ITN) found on CT, MR, NM and Extrathyroidal [place] are based upon the ACR white paper and [facility] 3-tiered system for managing ITNs: J Am Coll Radiol. 2015 Feb;12(2): 143-50. Electronically Signed   By: Evangeline Dakin M.D.   On: 07/11/2019 14:55   Ct Cervical Spine Wo Contrast  Result Date: 07/12/2019 CLINICAL DATA:  68 year old female status post fall yesterday and again today. EXAM: CT CERVICAL SPINE WITHOUT CONTRAST TECHNIQUE: Multidetector CT imaging of the cervical spine was performed without intravenous contrast. Multiplanar CT image reconstructions were also generated. COMPARISON:  Cervical spine CT yesterday. FINDINGS: Alignment: Stable straightening and mild reversal of cervical lordosis. Cervicothoracic junction alignment is within normal limits. Bilateral posterior element alignment is within normal limits. Skull base and vertebrae: Visualized skull base is intact. No atlanto-occipital dissociation. No acute osseous abnormality identified. Soft tissues and spinal canal: No prevertebral fluid or swelling. No visible canal hematoma. Stable noncontrast visible neck soft tissues, including  thyroid heterogeneity detailed yesterday. Disc levels: Chronic anterior C1-C2 joint degeneration with subchondral sclerosis. Lower cervical disc and endplate degeneration but eccentric anteriorly. Upper chest: Mild chronic appearing T1 superior endplate compression. Negative lung apices. IMPRESSION: No acute traumatic injury identified in the cervical spine. Stable compared to yesterday. Electronically Signed   By: Genevie Ann M.D.   On: 07/12/2019 21:17   Ct Cervical Spine Wo Contrast  Result Date: 07/11/2019 CLINICAL DATA:  68 year old who fell from a broken deck earlier today, falling approximately 3 feet. Patient denies loss of consciousness. Initial encounter. EXAM: CT HEAD WITHOUT CONTRAST CT CERVICAL SPINE WITHOUT CONTRAST TECHNIQUE: Multidetector CT imaging of the head and cervical spine was performed following the standard protocol without intravenous contrast. Multiplanar CT image reconstructions of the cervical spine were also generated. COMPARISON:  None. FINDINGS: CT HEAD FINDINGS Brain: Ventricular system normal in size and appearance for age. No mass lesion. No midline shift. No acute hemorrhage or hematoma. No extra-axial fluid collections. No evidence of acute infarction. Vascular: Minimal BILATERAL carotid siphon atherosclerosis. No hyperdense vessel. Skull: No skull fracture or other focal osseous abnormality involving the skull. Sinuses/Orbits: Visualized paranasal sinuses, bilateral mastoid air cells and bilateral middle ear cavities well-aerated. Visualized orbits and globes normal in appearance. Other: None. CT CERVICAL SPINE FINDINGS Alignment: Slight reversal of the usual cervical lordosis centered at C4-5. Anatomic posterior alignment. Facet joints anatomically aligned with diffuse degenerative changes. Skull base and vertebrae: No fractures identified involving the cervical spine. Coronal reformatted images demonstrate an intact craniocervical junction, intact dens and intact lateral  masses throughout. Soft tissues and spinal canal: No evidence of paraspinous or spinal canal hematoma. No evidence of spinal stenosis. Disc levels: Moderate disc space changes narrowing at C5-6 and endplate hypertrophic. Remaining disc spaces well-preserved. Degenerative changes at C1-C2. Facet hypertrophy accounts for mild LEFT foraminal stenosis at C3-4. Remaining neural foramina widely patent. Upper chest: Visualized lung apices clear. Mild atherosclerosis involving the proximal LEFT subclavian artery. Visualized superior mediastinum otherwise unremarkable. Other: Partially calcified nodules involving both lobes of the thyroid gland, the largest  measuring approximately 1.6 cm in the RIGHT lobe. IMPRESSION: 1. Normal intracranially. 2. No cervical spine fractures identified. 3. Slight reversal of the usual cervical lordosis centered at C4-5. 4. Degenerative disc disease and spondylosis at C5-6 and C6-7. 5. Partially calcified nodules involving both lobes of the thyroid gland, the largest measuring approximately 1.6 cm in the RIGHT lobe. Further evaluation with non-emergent thyroid ultrasound is recommended. Reference: Recommendations for f/u of Incidental Thyroid Nodules (ITN) found on CT, MR, NM and Extrathyroidal [place] are based upon the ACR white paper and [facility] 3-tiered system for managing ITNs: J Am Coll Radiol. 2015 Feb;12(2): 143-50. Electronically Signed   By: Evangeline Dakin M.D.   On: 07/11/2019 14:55   Ct Foot Right Wo Contrast  Result Date: 07/11/2019 CLINICAL DATA:  The patient suffered a 3 foot fall through a broken deck today resulting in a right calcaneal fracture. Initial encounter. EXAM: CT OF THE RIGHT FOOT WITHOUT CONTRAST TECHNIQUE: Multidetector CT imaging of the right foot was performed according to the standard protocol. Multiplanar CT image reconstructions were also generated. COMPARISON:  Plain films right ankle today. FINDINGS: Bones/Joint/Cartilage As seen on the comparison plain  films, the patient has an acute fracture of the calcaneus. Main fracture line is oblique in orientation extending from the superior aspect of the calcaneus just posterior to the subtalar joint in an anterior and lateral orientation through the periphery of the calcaneocuboid joint. The fracture is mildly comminuted but only minimally displaced. The fracture does not involve the subtalar joint. No other fracture is identified. Ligaments Suboptimally assessed by CT. Major ligamentous structures appear intact. There is no widening of the syndesmosis. Muscles and Tendons Intact.  No tendon entrapment is seen. Soft tissues There is some soft tissue swelling and hematoma about the ankle. IMPRESSION: Mildly comminuted fracture of the calcaneus extends to the lateral periphery of the calcaneus at calcaneocuboid joint. The fracture does not disrupt the articular surfaces of the subtalar joint. Negative for tendon entrapment. Electronically Signed   By: Inge Rise M.D.   On: 07/11/2019 16:24   Dg Knee Complete 4 Views Right  Result Date: 07/11/2019 CLINICAL DATA:  Pain after fall EXAM: RIGHT KNEE - COMPLETE 4+ VIEW COMPARISON:  None. FINDINGS: No evidence of fracture, dislocation, or joint effusion. No evidence of arthropathy or other focal bone abnormality. Soft tissues are unremarkable. IMPRESSION: Negative. Electronically Signed   By: Dorise Bullion III M.D   On: 07/11/2019 15:40   Dg Foot Complete Right  Result Date: 07/12/2019 CLINICAL DATA:  Fall through deck yesterday.  Repeat fall today. EXAM: RIGHT FOOT COMPLETE - 3+ VIEW COMPARISON:  None. FINDINGS: Calcaneal fracture again noted, comminuted and mildly displaced. No significant change since prior study. IMPRESSION: Stable appearance of the comminuted right calcaneal fracture. Electronically Signed   By: Rolm Baptise M.D.   On: 07/12/2019 20:43    Procedures Procedures (including critical care time)  Medications Ordered in ED Medications    sodium chloride (PF) 0.9 % injection (has no administration in time range)  oxyCODONE-acetaminophen (PERCOCET/ROXICET) 5-325 MG per tablet 1 tablet (1 tablet Oral Given 07/12/19 2023)     Initial Impression / Assessment and Plan / ED Course  I have reviewed the triage vital signs and the nursing notes.  Pertinent labs & imaging results that were available during my care of the patient were reviewed by me and considered in my medical decision making (see chart for details).        68 year old lady presents  to the ER after near fall in setting of recent fall and right calcaneal fracture.  Today, concerning for tenderness over her thoracic and lumbar back as well as some tenderness in her abdomen.  She also is complaining of right foot pain.  Plain films of her right foot were unremarkable, redemonstrated known calcaneal fracture.  CT is ordered to further evaluate for new traumatic process.  While awaiting CT scans, patient signed out to [place], please refer to her note for final plan disposition.    Final Clinical Impressions(s) / ED Diagnoses   Final diagnoses:  Fall, initial encounter  Closed nondisplaced fracture of right calcaneus with routine healing, unspecified portion of calcaneus, subsequent encounter  Back pain, unspecified back location, unspecified back pain laterality, unspecified chronicity    ED Discharge Orders    None       Lucrezia Starch, MD 07/12/19 2225

## 2019-07-12 NOTE — ED Notes (Signed)
Patient transported to X-ray 

## 2019-07-12 NOTE — ED Triage Notes (Signed)
Per EMS- Patient fell through a broken deck yesterday and was seen in the ED for the same. Patient has a fractured right foot.  Today, the patient was being taken up steps by her relatives and she fell out of the wheelchair. Patient c/o increased pain to the right foot.

## 2019-07-12 NOTE — ED Notes (Signed)
Patient transported to CT 

## 2019-07-13 ENCOUNTER — Encounter (HOSPITAL_COMMUNITY): Payer: Self-pay | Admitting: Internal Medicine

## 2019-07-13 DIAGNOSIS — E1169 Type 2 diabetes mellitus with other specified complication: Secondary | ICD-10-CM | POA: Diagnosis not present

## 2019-07-13 DIAGNOSIS — S92001A Unspecified fracture of right calcaneus, initial encounter for closed fracture: Secondary | ICD-10-CM | POA: Diagnosis present

## 2019-07-13 DIAGNOSIS — W19XXXA Unspecified fall, initial encounter: Secondary | ICD-10-CM | POA: Diagnosis present

## 2019-07-13 DIAGNOSIS — E669 Obesity, unspecified: Secondary | ICD-10-CM | POA: Diagnosis not present

## 2019-07-13 DIAGNOSIS — S92001D Unspecified fracture of right calcaneus, subsequent encounter for fracture with routine healing: Secondary | ICD-10-CM

## 2019-07-13 LAB — GLUCOSE, CAPILLARY
Glucose-Capillary: 136 mg/dL — ABNORMAL HIGH (ref 70–99)
Glucose-Capillary: 112 mg/dL — ABNORMAL HIGH (ref 70–99)
Glucose-Capillary: 106 mg/dL — ABNORMAL HIGH (ref 70–99)
Glucose-Capillary: 97 mg/dL (ref 70–99)
Glucose-Capillary: 126 mg/dL — ABNORMAL HIGH (ref 70–99)

## 2019-07-13 LAB — URINALYSIS, ROUTINE W REFLEX MICROSCOPIC
Bacteria, UA: NONE SEEN
Bilirubin Urine: NEGATIVE
Glucose, UA: NEGATIVE mg/dL
Ketones, ur: NEGATIVE mg/dL
Nitrite: NEGATIVE
Protein, ur: NEGATIVE mg/dL
Specific Gravity, Urine: 1.031 — ABNORMAL HIGH (ref 1.005–1.030)
pH: 5 (ref 5.0–8.0)

## 2019-07-13 LAB — CBC
HCT: 35.4 % — ABNORMAL LOW (ref 36.0–46.0)
HCT: 32.8 % — ABNORMAL LOW (ref 36.0–46.0)
Hemoglobin: 11 g/dL — ABNORMAL LOW (ref 12.0–15.0)
Hemoglobin: 10.3 g/dL — ABNORMAL LOW (ref 12.0–15.0)
MCH: 27.6 pg (ref 26.0–34.0)
MCH: 28.6 pg (ref 26.0–34.0)
MCHC: 31.1 g/dL (ref 30.0–36.0)
MCHC: 31.4 g/dL (ref 30.0–36.0)
MCV: 88.9 fL (ref 80.0–100.0)
MCV: 91.1 fL (ref 80.0–100.0)
Platelets: 241 10*3/uL (ref 150–400)
Platelets: 219 10*3/uL (ref 150–400)
RBC: 3.98 MIL/uL (ref 3.87–5.11)
RBC: 3.6 MIL/uL — ABNORMAL LOW (ref 3.87–5.11)
RDW: 13 % (ref 11.5–15.5)
RDW: 13.3 % (ref 11.5–15.5)
WBC: 8.4 10*3/uL (ref 4.0–10.5)
WBC: 8.3 10*3/uL (ref 4.0–10.5)
nRBC: 0 % (ref 0.0–0.2)
nRBC: 0 % (ref 0.0–0.2)

## 2019-07-13 LAB — CREATININE, SERUM
Creatinine, Ser: 0.92 mg/dL (ref 0.44–1.00)
GFR calc Af Amer: >60 mL/min (ref >60)
GFR calc non Af Amer: >60 mL/min (ref >60)

## 2019-07-13 LAB — SARS CORONAVIRUS 2 (TAT 6-24 HRS): SARS Coronavirus 2: NEGATIVE

## 2019-07-13 MED ORDER — CYCLOSPORINE 0.05 % OP EMUL
1.0000 [drp] | Freq: Two times a day (BID) | OPHTHALMIC | Status: DC
  Administered 2019-07-13 – 2019-07-18 (×10): 1 [drp] via OPHTHALMIC
  Filled 2019-07-13 (×12): qty 30

## 2019-07-13 MED ORDER — ACETAMINOPHEN 650 MG RE SUPP: 650.0000 mg | Freq: Four times a day (QID) | RECTAL | Status: DC | PRN

## 2019-07-13 MED ORDER — HYDROMORPHONE HCL 1 MG/ML IJ SOLN
1.0000 mg | Freq: Once | INTRAMUSCULAR | Status: AC
  Administered 2019-07-13: 1 mg via INTRAVENOUS
  Filled 2019-07-13: qty 1

## 2019-07-13 MED ORDER — BUPROPION HCL ER (XL) 150 MG PO TB24
150.0000 mg | ORAL_TABLET | Freq: Every day | ORAL | Status: DC
  Administered 2019-07-13 – 2019-07-18 (×6): 150 mg via ORAL
  Filled 2019-07-13 (×6): qty 1

## 2019-07-13 MED ORDER — SODIUM CHLORIDE 0.9 % IV BOLUS
500.0000 mL | Freq: Once | INTRAVENOUS | Status: AC
  Administered 2019-07-13: 500 mL via INTRAVENOUS

## 2019-07-13 MED ORDER — SERTRALINE HCL 50 MG PO TABS
150.0000 mg | ORAL_TABLET | Freq: Every day | ORAL | Status: DC
  Administered 2019-07-13 – 2019-07-18 (×6): 150 mg via ORAL
  Filled 2019-07-13 (×6): qty 1

## 2019-07-13 MED ORDER — ONDANSETRON HCL 4 MG PO TABS
4.0000 mg | ORAL_TABLET | Freq: Four times a day (QID) | ORAL | Status: DC | PRN
  Administered 2019-07-18: 4 mg via ORAL
  Filled 2019-07-13: qty 1

## 2019-07-13 MED ORDER — INSULIN ASPART 100 UNIT/ML ~~LOC~~ SOLN
0.0000 [IU] | Freq: Three times a day (TID) | SUBCUTANEOUS | Status: DC
  Administered 2019-07-16: 1 [IU] via SUBCUTANEOUS
  Filled 2019-07-13: qty 0.09

## 2019-07-13 MED ORDER — POTASSIUM CHLORIDE CRYS ER 20 MEQ PO TBCR
20.0000 meq | EXTENDED_RELEASE_TABLET | Freq: Once | ORAL | Status: AC
  Administered 2019-07-13: 20 meq via ORAL
  Filled 2019-07-13: qty 1

## 2019-07-13 MED ORDER — AMPHETAMINE-DEXTROAMPHETAMINE 10 MG PO TABS
10.0000 mg | ORAL_TABLET | Freq: Every morning | ORAL | Status: DC
  Administered 2019-07-13 – 2019-07-18 (×6): 10 mg via ORAL
  Filled 2019-07-13 (×6): qty 1

## 2019-07-13 MED ORDER — ACETAMINOPHEN 325 MG PO TABS
650.0000 mg | ORAL_TABLET | Freq: Four times a day (QID) | ORAL | Status: DC | PRN
  Administered 2019-07-13 – 2019-07-17 (×3): 650 mg via ORAL
  Filled 2019-07-13 (×3): qty 2

## 2019-07-13 MED ORDER — ONDANSETRON HCL 4 MG/2ML IJ SOLN
4.0000 mg | Freq: Four times a day (QID) | INTRAMUSCULAR | Status: DC | PRN
  Administered 2019-07-17: 4 mg via INTRAVENOUS
  Filled 2019-07-13: qty 2

## 2019-07-13 MED ORDER — ALPRAZOLAM 0.5 MG PO TABS
0.5000 mg | ORAL_TABLET | Freq: Three times a day (TID) | ORAL | Status: DC | PRN
  Administered 2019-07-13 – 2019-07-17 (×5): 0.5 mg via ORAL
  Filled 2019-07-13 (×5): qty 1

## 2019-07-13 MED ORDER — PANTOPRAZOLE SODIUM 40 MG PO TBEC
40.0000 mg | DELAYED_RELEASE_TABLET | Freq: Every day | ORAL | Status: DC
  Administered 2019-07-13 – 2019-07-18 (×6): 40 mg via ORAL
  Filled 2019-07-13 (×6): qty 1

## 2019-07-13 MED ORDER — TIZANIDINE HCL 4 MG PO TABS
4.0000 mg | ORAL_TABLET | Freq: Every day | ORAL | Status: DC
  Administered 2019-07-13 – 2019-07-17 (×6): 4 mg via ORAL
  Filled 2019-07-13 (×6): qty 1

## 2019-07-13 MED ORDER — LEVOTHYROXINE SODIUM 25 MCG PO TABS
25.0000 ug | ORAL_TABLET | Freq: Every day | ORAL | Status: DC
  Administered 2019-07-13 – 2019-07-18 (×6): 25 ug via ORAL
  Filled 2019-07-13 (×6): qty 1

## 2019-07-13 MED ORDER — OXYCODONE-ACETAMINOPHEN 5-325 MG PO TABS
1.0000 | ORAL_TABLET | ORAL | Status: DC | PRN
  Administered 2019-07-13 – 2019-07-15 (×8): 2 via ORAL
  Administered 2019-07-15 (×2): 1 via ORAL
  Administered 2019-07-16 (×4): 2 via ORAL
  Administered 2019-07-17: 1 via ORAL
  Administered 2019-07-17 – 2019-07-18 (×6): 2 via ORAL
  Filled 2019-07-13 (×4): qty 2
  Filled 2019-07-13: qty 1
  Filled 2019-07-13 (×5): qty 2
  Filled 2019-07-13 (×2): qty 1
  Filled 2019-07-13 (×2): qty 2
  Filled 2019-07-13: qty 1
  Filled 2019-07-13 (×4): qty 2
  Filled 2019-07-13: qty 1
  Filled 2019-07-13 (×2): qty 2

## 2019-07-13 MED ORDER — PRAVASTATIN SODIUM 20 MG PO TABS
10.0000 mg | ORAL_TABLET | Freq: Every day | ORAL | Status: DC
  Administered 2019-07-13 – 2019-07-17 (×5): 10 mg via ORAL
  Filled 2019-07-13 (×5): qty 1

## 2019-07-13 MED ORDER — HEPARIN SODIUM (PORCINE) 5000 UNIT/ML IJ SOLN
5000.0000 [IU] | Freq: Three times a day (TID) | INTRAMUSCULAR | Status: DC
  Administered 2019-07-13 – 2019-07-18 (×16): 5000 [IU] via SUBCUTANEOUS
  Filled 2019-07-13 (×17): qty 1

## 2019-07-13 NOTE — Progress Notes (Signed)
Per HPI: Jenny Cardenas is a 68 y.o. female with history of diabetes mellitus type 2, hypothyroidism, hyperlipidemia was brought to the ER for the second time in last 48 hours after patient had another fall.  Patient states the first fall was after patient had tripped on the deck hit her head and a small laceration occipital area and also hit her right foot which led to fracture of the calcaneum and was planned to go as outpatient with [facility] Dr. Lucia Gaskins.  After reaching home family was trying to help to the next floor with wheelchair and was lifting up the stairs when they lost control and fell back again and this time patient again hit her head but did not lose consciousness.  Patient's pain was increased than usual and was brought to the ER.  11/30: Patient seen and evaluated at bedside today. She has pain to her heel that is currently well-controlled. PT evaluation is pending. Covid testing negative. Awaiting call back from orthopedics regarding calcaneal fracture evaluation. Likely home with home health PT in am after PT evaluation today.  Total care time: 20 minutes.

## 2019-07-13 NOTE — Progress Notes (Signed)
Orthopedic Tech Progress Note Patient Details:  Jenny Cardenas 1951-07-13 [id number]  Ortho Devices Type of Ortho Device: Post (short leg) splint Ortho Device/Splint Location: rle Ortho Device/Splint Interventions: Ordered, Application, Adjustment   Post Interventions Patient Tolerated: Well Instructions Provided: Care of device, Adjustment of device   Karolee Stamps 07/13/2019, 1:28 AM

## 2019-07-13 NOTE — ED Notes (Signed)
Attempted to call report to 4W, RN unavailable and will return call.

## 2019-07-13 NOTE — Progress Notes (Signed)
Patient requesting Xanax for anxiety attack. Patient is tearful and anxious. Just completed therapy session and therapist had discussed the possibility of patient requiring rehab or skilled nursing placement prior to home. Patient anxious about possibility r/t risk of covid. Administered xanax, educated patient and family about process and encouraged patient to relax/deep breathe as they will be able to speak to a Education officer, museum and the MD tonight or tomorrow. Patient and daughter verbalized understanding.

## 2019-07-13 NOTE — Plan of Care (Signed)
Initiated care plan 

## 2019-07-13 NOTE — TOC Progression Note (Signed)
Transition of Care [facility]) - Progression Note    Patient Details  Name: Jenny Cardenas MRN: [id number] Date of Birth: 1950/09/27  Transition of [facility] CM/SW Contact  Purcell Mouton, RN Phone Number: 07/13/2019, 12:16 PM  Clinical Narrative:    Spoke with pt in length concerning discharge plans, DME. Pt would like to stay here at [facility]. Explained SNF/Rehab and HH to pt. Pt would also like a knee walker scooter. On checking the prices for knee walker scooter. Pt may rent one from [employer] for $85.00 monthly to purchase cost is $400.00. Explained this to pt and daughter Earnest Bailey at bedside. Pt does not want her sister Florentina Jenny called or information given out to her. Pt asked that her daughters Earnest Bailey and Nira Conn be informed. Explained Observation to pt and daughter Earnest Bailey.    Expected Discharge Plan: [facility](pt is not sure about home or SNF) Barriers to Discharge: No Barriers Identified  Expected Discharge Plan and Services Expected Discharge Plan: [facility](pt is not sure about home or SNF)   Discharge Planning Services: CM Consult   Living arrangements for the past 2 months: Single Family Home                                       Social Determinants of Health (SDOH) Interventions    Readmission Risk Interventions No flowsheet data found.

## 2019-07-13 NOTE — H&P (Signed)
History and Physical    Jenny Cardenas NTI:[id number] DOB: [birth year] DOA: 07/12/2019  PCP: Maurice Small, MD  Patient coming from: Home.  Chief Complaint: Fall.  HPI: Jenny Cardenas is a 68 y.o. female with history of diabetes mellitus type 2, hypothyroidism, hyperlipidemia was brought to the ER for the second time in last 48 hours after patient had another fall.  Patient states the first fall was after patient had tripped on the deck hit her head and a small laceration occipital area and also hit her right foot which led to fracture of the calcaneum and was planned to go as outpatient with [facility] Dr. Lucia Gaskins.  After reaching home family was trying to help to the next floor with wheelchair and was lifting up the stairs when they lost control and fell back again and this time patient again hit her head but did not lose consciousness.  Patient's pain was increased than usual and was brought to the ER.  ED Course: The ER the splint was open and there is no definite signs of any compartment syndrome swelling was mildly increased pain was increased was given pain relief medication.  CT head C-spine CT abdomen pelvis CT chest was done again.  Nothing acute.  Given the worsening pain and difficulty care at home patient admitted for further management.  Labs reveal hemoglobin of 11.7 WBC 10.7 creatinine 1 potassium 3.4 total bilirubin 1.3 EKG shows normal sinus rhythm COVID-19 test is pending.  Patient did have brief episode of hypotension after receiving pain relief medications along with muscle relaxant for which patient was given fluid bolus.  Review of Systems: As per HPI, rest all negative.   Past Medical History:  Diagnosis Date   Anxiety    Arthritis    Carpal tunnel syndrome    Cataract    Depression    Diabetes mellitus without complication [facility])    Type 2   Fall April 10, 2015   GERD (gastroesophageal reflux disease)    Goiter    History of shingles    On  abdomen, left ring finger, and left leg; Pt takes Valtrex   Hyperlipidemia    Hypertension    Obesity    PTSD (post-traumatic stress disorder)    Shortness of breath dyspnea    pt stated its related to the back problems she has   Thyroid disease    Ulcer     Past Surgical History:  Procedure Laterality Date   ABDOMINAL HYSTERECTOMY  1996   CATARACT EXTRACTION, BILATERAL     CESAREAN [place]   COLONOSCOPY W/ POLYPECTOMY     KYPHOPLASTY N/A 06/02/2015   Procedure: KYPHOPLASTY;  Surgeon: Phylliss Bob, MD;  Location: [place];  Service: Orthopedics;  Laterality: N/A;  Thoracic 3, 8, 10 kyphoplasty     reports that she quit smoking about 3 years ago. Her smoking use included cigarettes. She has a 2.00 pack-year smoking history. She has never used smokeless tobacco. She reports current alcohol use. She reports that she does not use drugs.  Allergies  Allergen Reactions   Other     Etholine Oxide: causes itching and hives, and temporary blindness    Family History  Problem Relation Age of Onset   COPD Mother    Depression Mother    Hypertension Mother    Hyperlipidemia Mother    Breast cancer Mother 45   Alzheimer's disease Father    Heart disease Father    Breast cancer  Sister 69   Colon cancer Neg Hx     Prior to Admission medications   Medication Sig Start Date End Date Taking? Authorizing Provider  albuterol (PROVENTIL) (2.5 MG/3ML) 0.083% nebulizer solution Take 2.5 mg by nebulization every 4 (four) hours as needed for wheezing or shortness of breath.  02/27/17  Yes [provider]  ALPRAZolam Duanne Moron) 0.5 MG tablet Take 0.5 mg by mouth 3 (three) times daily as needed for anxiety. 07/08/19  Yes [provider]  amphetamine-dextroamphetamine (ADDERALL) 10 MG tablet Take 10 mg by mouth every morning. 06/05/19  Yes [provider]  buPROPion (WELLBUTRIN XL) 150 MG 24 hr tablet Take 150 mg by mouth daily.   Yes  [provider]  Cyanocobalamin (VITAMIN B 12) 500 MCG TABS Take 500 mg by mouth once a week.   Yes [provider]  cycloSPORINE (RESTASIS) 0.05 % ophthalmic emulsion Place 1 drop into both eyes 2 (two) times daily.   Yes [provider]  furosemide (LASIX) 20 MG tablet Take 20 mg by mouth daily.   Yes [provider]  Ibuprofen-diphenhydrAMINE Cit (MOTRIN PM) 200-38 MG TABS Take 1 tablet by mouth at bedtime as needed (sleep).   Yes [provider]  ketoconazole (NIZORAL) 2 % cream Apply 1 application topically daily as needed for irritation.  06/11/19  Yes [provider]  levothyroxine (SYNTHROID, LEVOTHROID) 25 MCG tablet Take 25 mcg by mouth daily before breakfast.   Yes [provider]  metFORMIN (GLUCOPHAGE-XR) 500 MG 24 hr tablet Take 500 mg by mouth every evening.   Yes [provider]  mupirocin ointment (BACTROBAN) 2 % Apply 1 application topically daily as needed (for mask dermatitis).  05/14/19  Yes [provider]  oxyCODONE-acetaminophen (PERCOCET/ROXICET) 5-325 MG tablet Take 1-2 tablets by mouth every 6 (six) hours as needed for moderate pain or severe pain. 07/11/19  Yes Carlisle Cater, PA-C  pantoprazole (PROTONIX) 40 MG tablet Take 40 mg by mouth daily.   Yes [provider]  pravastatin (PRAVACHOL) 10 MG tablet Take 10 mg by mouth daily.   Yes [provider]  sertraline (ZOLOFT) 100 MG tablet Take 150 mg by mouth daily. Take 150 mg (1 whole tablet and 1/2 tablet) by mouth once daily.   Yes [provider]  tiZANidine (ZANAFLEX) 4 MG tablet Take 4 mg by mouth at bedtime. 07/03/19  Yes [provider]  triamcinolone cream (KENALOG) 0.1 % Apply 1 application topically. 05/13/19  Yes [provider]  valACYclovir (VALTREX) 500 MG tablet Take 1 tablet (500 mg total) by mouth daily. 04/20/13  Yes Ricard Dillon, MD  Vitamin D, Ergocalciferol, (DRISDOL) 1.25 MG  (50000 UT) CAPS capsule 1 CAPSULE ONCE A WEEK X 8 WEEKS 04/09/19  Yes [provider]    Physical Exam: Constitutional: Moderately built and nourished. Vitals:   07/12/19 2330 07/13/19 0000 07/13/19 0054 07/13/19 0100  BP: 127/69 122/61  (!) 99/45  Pulse: 82 74 75 79  Resp: (!) 21 19 16 14   Temp:      TempSrc:      SpO2: 90% 96% 94% 90%  Weight:      Height:       Eyes: Anicteric no pallor. ENMT: No discharge from the ears eyes nose or mouth. Neck: No mass felt.  No neck rigidity. Respiratory: No rhonchi or crepitations. Cardiovascular: S1-S2 heard. Abdomen: Soft nontender bowel sounds present. Musculoskeletal: Right foot is in the splint. Skin: Chronic skin changes. Neurologic: Alert  awake oriented time place and person.  Moves all extremities. Psychiatric: Appears normal.   Labs on Admission: I have personally reviewed following labs and imaging studies  CBC: Recent Labs  Lab 07/12/19 2126  WBC 10.7*  NEUTROABS 7.5  HGB 11.3*  HCT 36.5  MCV 89.0  PLT 469   Basic Metabolic Panel: Recent Labs  Lab 07/12/19 2126  NA 140  K 3.4*  CL 105  CO2 25  GLUCOSE 115*  BUN 11  CREATININE 1.01*  CALCIUM 8.7*   GFR: Estimated Creatinine Clearance: 52 mL/min (A) (by C-G formula based on SCr of 1.01 mg/dL (H)). Liver Function Tests: Recent Labs  Lab 07/12/19 2126  AST 24  ALT 16  ALKPHOS 75  BILITOT 1.3*  PROT 7.0  ALBUMIN 3.9   No results for input(s): LIPASE, AMYLASE in the last 168 hours. No results for input(s): AMMONIA in the last 168 hours. Coagulation Profile: No results for input(s): INR, PROTIME in the last 168 hours. Cardiac Enzymes: No results for input(s): CKTOTAL, CKMB, CKMBINDEX, TROPONINI in the last 168 hours. BNP (last 3 results) No results for input(s): PROBNP in the last 8760 hours. HbA1C: No results for input(s): HGBA1C in the last 72 hours. CBG: No results for input(s): GLUCAP in the last 168 hours. Lipid Profile: No results  for input(s): CHOL, HDL, LDLCALC, TRIG, CHOLHDL, LDLDIRECT in the last 72 hours. Thyroid Function Tests: No results for input(s): TSH, T4TOTAL, FREET4, T3FREE, THYROIDAB in the last 72 hours. Anemia Panel: No results for input(s): VITAMINB12, FOLATE, FERRITIN, TIBC, IRON, RETICCTPCT in the last 72 hours. Urine analysis:    Component Value Date/Time   COLORURINE YELLOW 09/30/2018 1716   APPEARANCEUR Clear 04/21/2019 1456   LABSPEC 1.017 09/30/2018 1716   PHURINE 5.0 09/30/2018 1716   GLUCOSEU Negative 04/21/2019 1456   HGBUR MODERATE (A) 09/30/2018 1716   HGBUR 2+ 04/04/2010 0843   BILIRUBINUR Negative 04/21/2019 1456   KETONESUR NEGATIVE 09/30/2018 1716   PROTEINUR Negative 04/21/2019 1456   PROTEINUR 100 (A) 09/30/2018 1716   UROBILINOGEN 0.2 06/01/2015 1347   NITRITE Negative 04/21/2019 1456   NITRITE POSITIVE (A) 09/30/2018 1716   LEUKOCYTESUR Negative 04/21/2019 1456   LEUKOCYTESUR LARGE (A) 09/30/2018 1716   Sepsis Labs: @LABRCNTIP (procalcitonin:4,lacticidven:4) )No results found for this or any previous visit (from the past 240 hour(s)).   Radiological Exams on Admission: Dg Tibia/fibula Right  Result Date: 07/12/2019 CLINICAL DATA:  68 year old female status post fall 3 broken deck yesterday with fractured right foot. Fall from wheelchair today. EXAM: RIGHT TIBIA AND FIBULA - 2 VIEW COMPARISON:  Right foot series today. Right foot CT yesterday. Right knee series 05/03/2017. FINDINGS: Comminuted fracture of the right calcaneus as reported yesterday. Mortise joint alignment appears preserved. Knee joint alignment appears preserved. On the cross-table lateral view there is suggestion of small right knee joint effusion. Patella appears stable and intact. No acute right tibia or fibula fracture identified. Sherald Barge move again noted. IMPRESSION: 1. No acute right tibia or fibula fracture identified. Comminuted right calcaneus fracture as reported yesterday. 2. Possible small right  knee joint effusion. Electronically Signed   By: Genevie Ann M.D.   On: 07/12/2019 20:45   Dg Ankle Complete Right  Result Date: 07/11/2019 CLINICAL DATA:  Fall.  Ankle swelling. EXAM: RIGHT ANKLE - COMPLETE 3+ VIEW COMPARISON:  None. FINDINGS: Significant soft tissue swelling, particularly laterally. The fibula is intact. Mild irregularity of the medial malleolus may be from an old avulsion injury. No acute tibial fracture  noted. There is a comminuted fracture of the calcaneus best appreciated on the lateral view. No significant loss of calcaneal height is identified. The talus is intact. IMPRESSION: 1. There is a comminuted fracture of the calcaneus with displaced fragments. No significant loss of height based on the lateral view. Recommend CT imaging for better evaluation. 2. No other definite fractures are seen. Electronically Signed   By: Dorise Bullion III M.D   On: 07/11/2019 15:39   Ct Head Wo Contrast  Result Date: 07/12/2019 CLINICAL DATA:  68 year old female status post fall yesterday and again today. EXAM: CT HEAD WITHOUT CONTRAST TECHNIQUE: Contiguous axial images were obtained from the base of the skull through the vertex without intravenous contrast. COMPARISON:  Head and cervical spine CT yesterday. FINDINGS: Brain: Cerebral volume is within normal limits for age. No midline shift, ventriculomegaly, mass effect, evidence of mass lesion, intracranial hemorrhage or evidence of cortically based acute infarction. Gray-white matter differentiation is within normal limits throughout the brain. Vascular: Mild Calcified atherosclerosis at the skull base. No suspicious intracranial vascular hyperdensity. Skull: Stable and intact. Sinuses/Orbits: Visualized paranasal sinuses and mastoids are stable and well pneumatized. Other: No acute orbit or scalp soft tissue finding. IMPRESSION: Stable and normal for age non contrast CT appearance of the brain. Electronically Signed   By: Genevie Ann M.D.   On:  07/12/2019 21:13   Ct Head Wo Contrast  Result Date: 07/11/2019 CLINICAL DATA:  68 year old who fell from a broken deck earlier today, falling approximately 3 feet. Patient denies loss of consciousness. Initial encounter. EXAM: CT HEAD WITHOUT CONTRAST CT CERVICAL SPINE WITHOUT CONTRAST TECHNIQUE: Multidetector CT imaging of the head and cervical spine was performed following the standard protocol without intravenous contrast. Multiplanar CT image reconstructions of the cervical spine were also generated. COMPARISON:  None. FINDINGS: CT HEAD FINDINGS Brain: Ventricular system normal in size and appearance for age. No mass lesion. No midline shift. No acute hemorrhage or hematoma. No extra-axial fluid collections. No evidence of acute infarction. Vascular: Minimal BILATERAL carotid siphon atherosclerosis. No hyperdense vessel. Skull: No skull fracture or other focal osseous abnormality involving the skull. Sinuses/Orbits: Visualized paranasal sinuses, bilateral mastoid air cells and bilateral middle ear cavities well-aerated. Visualized orbits and globes normal in appearance. Other: None. CT CERVICAL SPINE FINDINGS Alignment: Slight reversal of the usual cervical lordosis centered at C4-5. Anatomic posterior alignment. Facet joints anatomically aligned with diffuse degenerative changes. Skull base and vertebrae: No fractures identified involving the cervical spine. Coronal reformatted images demonstrate an intact craniocervical junction, intact dens and intact lateral masses throughout. Soft tissues and spinal canal: No evidence of paraspinous or spinal canal hematoma. No evidence of spinal stenosis. Disc levels: Moderate disc space changes narrowing at C5-6 and endplate hypertrophic. Remaining disc spaces well-preserved. Degenerative changes at C1-C2. Facet hypertrophy accounts for mild LEFT foraminal stenosis at C3-4. Remaining neural foramina widely patent. Upper chest: Visualized lung apices clear. Mild  atherosclerosis involving the proximal LEFT subclavian artery. Visualized superior mediastinum otherwise unremarkable. Other: Partially calcified nodules involving both lobes of the thyroid gland, the largest measuring approximately 1.6 cm in the RIGHT lobe. IMPRESSION: 1. Normal intracranially. 2. No cervical spine fractures identified. 3. Slight reversal of the usual cervical lordosis centered at C4-5. 4. Degenerative disc disease and spondylosis at C5-6 and C6-7. 5. Partially calcified nodules involving both lobes of the thyroid gland, the largest measuring approximately 1.6 cm in the RIGHT lobe. Further evaluation with non-emergent thyroid ultrasound is recommended. Reference: Recommendations for f/u of  Incidental Thyroid Nodules (ITN) found on CT, MR, NM and Extrathyroidal [place] are based upon the ACR white paper and [facility] 3-tiered system for managing ITNs: J Am Coll Radiol. 2015 Feb;12(2): 143-50. Electronically Signed   By: Evangeline Dakin M.D.   On: 07/11/2019 14:55   Ct Chest W Contrast  Result Date: 07/12/2019 CLINICAL DATA:  Fall through broken deck yesterday now fall out of wheelchair EXAM: CT CHEST, abdomen, pelvis WITH CONTRAST TECHNIQUE: Multidetector CT imaging of the chest, abdomen, pelvis was performed during intravenous contrast administration. CONTRAST:  156mL OMNIPAQUE IOHEXOL 300 MG/ML  SOLN COMPARISON:  September 30, 2018 FINDINGS: Cardiovascular: Normal heart size. No significant pericardial fluid/thickening. Great vessels are normal in course and caliber. No evidence of acute thoracic aortic injury. No central pulmonary emboli. Scattered aortic atherosclerotic calcifications are seen without aneurysmal dilatation. Coronary artery calcifications are seen. Mediastinum/Nodes: No pneumomediastinum. No mediastinal hematoma. Unremarkable esophagus. No axillary, mediastinal or hilar lymphadenopathy.There are bilateral low-density lesions seen throughout the thyroid gland the largest within the  right thyroid lobe measuring 1.2 cm. This was seen on a prior CT of 2019. Lungs/Pleura:Minimal ground-glass opacity seen within the right upper lung. For for for no pneumothorax. No pleural effusion. Musculoskeletal: There is cement fixation seen within the T3 T8 and T10 vertebral bodies. There appears to be extruded cement seen anteriorly at the T3 vertebral body. There is chronic slight anterior wedge compression deformity also seen of the T7 vertebral body as on prior MRI 2016 with less than 25% loss in height. No fracture seen in the thorax. Hepatobiliary: There is diffuse low density seen throughout the liver parenchyma. No focal hepatic lesion however is noted. No focal lesion. Gallbladder physiologically distended, no calcified stone. No biliary dilatation. Pancreas: No evidence for traumatic injury. Portions are partially obscured by adjacent bowel loops and paucity of intra-abdominal fat. No ductal dilatation or inflammation. Spleen: Homogeneous attenuation without traumatic injury. Normal in size. Adrenals/Urinary Tract: No adrenal hemorrhage. Kidneys demonstrate symmetric enhancement and excretion on delayed phase imaging. No evidence or renal injury. Ureters are well opacified proximal through mid portion. Bladder is physiologically distended without wall thickening. Stomach/Bowel: Suboptimally assessed without enteric contrast, allowing for this, no evidence of bowel injury. Stomach physiologically distended. There are no dilated or thickened small or large bowel loops. Scattered colonic diverticula without diverticulitis. Moderate stool burden. No evidence of mesenteric hematoma. No free air free fluid. Vascular/Lymphatic: No acute vascular injury. The abdominal aorta and IVC are intact. Of no evidence of retroperitoneal, abdominal, or pelvic adenopathy. Reproductive: No acute abnormality. The patient is status post hysterectomy. Other: No focal contusion or abnormality of the abdominal wall.  Musculoskeletal: No acute fracture of the lumbar spine or bony pelvis. IMPRESSION: 1. No acute intrathoracic, abdominal, or pelvic injury. 2. Prior cement fixation of the T3, T8, T10 vertebral bodies. 3. Chronic slight anterior compression deformity of the T7 vertebral body with less than 25% loss in height. 4. Diverticula without diverticulitis. 5.  Aortic Atherosclerosis (ICD10-I70.0). Electronically Signed   By: Prudencio Pair M.D.   On: 07/12/2019 23:03   Ct Cervical Spine Wo Contrast  Result Date: 07/12/2019 CLINICAL DATA:  68 year old female status post fall yesterday and again today. EXAM: CT CERVICAL SPINE WITHOUT CONTRAST TECHNIQUE: Multidetector CT imaging of the cervical spine was performed without intravenous contrast. Multiplanar CT image reconstructions were also generated. COMPARISON:  Cervical spine CT yesterday. FINDINGS: Alignment: Stable straightening and mild reversal of cervical lordosis. Cervicothoracic junction alignment is within normal limits. Bilateral posterior  element alignment is within normal limits. Skull base and vertebrae: Visualized skull base is intact. No atlanto-occipital dissociation. No acute osseous abnormality identified. Soft tissues and spinal canal: No prevertebral fluid or swelling. No visible canal hematoma. Stable noncontrast visible neck soft tissues, including thyroid heterogeneity detailed yesterday. Disc levels: Chronic anterior C1-C2 joint degeneration with subchondral sclerosis. Lower cervical disc and endplate degeneration but eccentric anteriorly. Upper chest: Mild chronic appearing T1 superior endplate compression. Negative lung apices. IMPRESSION: No acute traumatic injury identified in the cervical spine. Stable compared to yesterday. Electronically Signed   By: Genevie Ann M.D.   On: 07/12/2019 21:17   Ct Cervical Spine Wo Contrast  Result Date: 07/11/2019 CLINICAL DATA:  68 year old who fell from a broken deck earlier today, falling approximately 3  feet. Patient denies loss of consciousness. Initial encounter. EXAM: CT HEAD WITHOUT CONTRAST CT CERVICAL SPINE WITHOUT CONTRAST TECHNIQUE: Multidetector CT imaging of the head and cervical spine was performed following the standard protocol without intravenous contrast. Multiplanar CT image reconstructions of the cervical spine were also generated. COMPARISON:  None. FINDINGS: CT HEAD FINDINGS Brain: Ventricular system normal in size and appearance for age. No mass lesion. No midline shift. No acute hemorrhage or hematoma. No extra-axial fluid collections. No evidence of acute infarction. Vascular: Minimal BILATERAL carotid siphon atherosclerosis. No hyperdense vessel. Skull: No skull fracture or other focal osseous abnormality involving the skull. Sinuses/Orbits: Visualized paranasal sinuses, bilateral mastoid air cells and bilateral middle ear cavities well-aerated. Visualized orbits and globes normal in appearance. Other: None. CT CERVICAL SPINE FINDINGS Alignment: Slight reversal of the usual cervical lordosis centered at C4-5. Anatomic posterior alignment. Facet joints anatomically aligned with diffuse degenerative changes. Skull base and vertebrae: No fractures identified involving the cervical spine. Coronal reformatted images demonstrate an intact craniocervical junction, intact dens and intact lateral masses throughout. Soft tissues and spinal canal: No evidence of paraspinous or spinal canal hematoma. No evidence of spinal stenosis. Disc levels: Moderate disc space changes narrowing at C5-6 and endplate hypertrophic. Remaining disc spaces well-preserved. Degenerative changes at C1-C2. Facet hypertrophy accounts for mild LEFT foraminal stenosis at C3-4. Remaining neural foramina widely patent. Upper chest: Visualized lung apices clear. Mild atherosclerosis involving the proximal LEFT subclavian artery. Visualized superior mediastinum otherwise unremarkable. Other: Partially calcified nodules involving both  lobes of the thyroid gland, the largest measuring approximately 1.6 cm in the RIGHT lobe. IMPRESSION: 1. Normal intracranially. 2. No cervical spine fractures identified. 3. Slight reversal of the usual cervical lordosis centered at C4-5. 4. Degenerative disc disease and spondylosis at C5-6 and C6-7. 5. Partially calcified nodules involving both lobes of the thyroid gland, the largest measuring approximately 1.6 cm in the RIGHT lobe. Further evaluation with non-emergent thyroid ultrasound is recommended. Reference: Recommendations for f/u of Incidental Thyroid Nodules (ITN) found on CT, MR, NM and Extrathyroidal [place] are based upon the ACR white paper and [facility] 3-tiered system for managing ITNs: J Am Coll Radiol. 2015 Feb;12(2): 143-50. Electronically Signed   By: Evangeline Dakin M.D.   On: 07/11/2019 14:55   Ct Abdomen Pelvis W Contrast  Result Date: 07/12/2019 CLINICAL DATA:  Fall through broken deck yesterday now fall out of wheelchair EXAM: CT CHEST, abdomen, pelvis WITH CONTRAST TECHNIQUE: Multidetector CT imaging of the chest, abdomen, pelvis was performed during intravenous contrast administration. CONTRAST:  129mL OMNIPAQUE IOHEXOL 300 MG/ML  SOLN COMPARISON:  September 30, 2018 FINDINGS: Cardiovascular: Normal heart size. No significant pericardial fluid/thickening. Great vessels are normal in course and caliber. No evidence  of acute thoracic aortic injury. No central pulmonary emboli. Scattered aortic atherosclerotic calcifications are seen without aneurysmal dilatation. Coronary artery calcifications are seen. Mediastinum/Nodes: No pneumomediastinum. No mediastinal hematoma. Unremarkable esophagus. No axillary, mediastinal or hilar lymphadenopathy.There are bilateral low-density lesions seen throughout the thyroid gland the largest within the right thyroid lobe measuring 1.2 cm. This was seen on a prior CT of 2019. Lungs/Pleura:Minimal ground-glass opacity seen within the right upper lung. For for for  no pneumothorax. No pleural effusion. Musculoskeletal: There is cement fixation seen within the T3 T8 and T10 vertebral bodies. There appears to be extruded cement seen anteriorly at the T3 vertebral body. There is chronic slight anterior wedge compression deformity also seen of the T7 vertebral body as on prior MRI 2016 with less than 25% loss in height. No fracture seen in the thorax. Hepatobiliary: There is diffuse low density seen throughout the liver parenchyma. No focal hepatic lesion however is noted. No focal lesion. Gallbladder physiologically distended, no calcified stone. No biliary dilatation. Pancreas: No evidence for traumatic injury. Portions are partially obscured by adjacent bowel loops and paucity of intra-abdominal fat. No ductal dilatation or inflammation. Spleen: Homogeneous attenuation without traumatic injury. Normal in size. Adrenals/Urinary Tract: No adrenal hemorrhage. Kidneys demonstrate symmetric enhancement and excretion on delayed phase imaging. No evidence or renal injury. Ureters are well opacified proximal through mid portion. Bladder is physiologically distended without wall thickening. Stomach/Bowel: Suboptimally assessed without enteric contrast, allowing for this, no evidence of bowel injury. Stomach physiologically distended. There are no dilated or thickened small or large bowel loops. Scattered colonic diverticula without diverticulitis. Moderate stool burden. No evidence of mesenteric hematoma. No free air free fluid. Vascular/Lymphatic: No acute vascular injury. The abdominal aorta and IVC are intact. Of no evidence of retroperitoneal, abdominal, or pelvic adenopathy. Reproductive: No acute abnormality. The patient is status post hysterectomy. Other: No focal contusion or abnormality of the abdominal wall. Musculoskeletal: No acute fracture of the lumbar spine or bony pelvis. IMPRESSION: 1. No acute intrathoracic, abdominal, or pelvic injury. 2. Prior cement fixation of the  T3, T8, T10 vertebral bodies. 3. Chronic slight anterior compression deformity of the T7 vertebral body with less than 25% loss in height. 4. Diverticula without diverticulitis. 5.  Aortic Atherosclerosis (ICD10-I70.0). Electronically Signed   By: Prudencio Pair M.D.   On: 07/12/2019 23:03   Ct Foot Right Wo Contrast  Result Date: 07/11/2019 CLINICAL DATA:  The patient suffered a 3 foot fall through a broken deck today resulting in a right calcaneal fracture. Initial encounter. EXAM: CT OF THE RIGHT FOOT WITHOUT CONTRAST TECHNIQUE: Multidetector CT imaging of the right foot was performed according to the standard protocol. Multiplanar CT image reconstructions were also generated. COMPARISON:  Plain films right ankle today. FINDINGS: Bones/Joint/Cartilage As seen on the comparison plain films, the patient has an acute fracture of the calcaneus. Main fracture line is oblique in orientation extending from the superior aspect of the calcaneus just posterior to the subtalar joint in an anterior and lateral orientation through the periphery of the calcaneocuboid joint. The fracture is mildly comminuted but only minimally displaced. The fracture does not involve the subtalar joint. No other fracture is identified. Ligaments Suboptimally assessed by CT. Major ligamentous structures appear intact. There is no widening of the syndesmosis. Muscles and Tendons Intact.  No tendon entrapment is seen. Soft tissues There is some soft tissue swelling and hematoma about the ankle. IMPRESSION: Mildly comminuted fracture of the calcaneus extends to the lateral periphery of the  calcaneus at calcaneocuboid joint. The fracture does not disrupt the articular surfaces of the subtalar joint. Negative for tendon entrapment. Electronically Signed   By: Inge Rise M.D.   On: 07/11/2019 16:24   Dg Knee Complete 4 Views Right  Result Date: 07/11/2019 CLINICAL DATA:  Pain after fall EXAM: RIGHT KNEE - COMPLETE 4+ VIEW COMPARISON:   None. FINDINGS: No evidence of fracture, dislocation, or joint effusion. No evidence of arthropathy or other focal bone abnormality. Soft tissues are unremarkable. IMPRESSION: Negative. Electronically Signed   By: Dorise Bullion III M.D   On: 07/11/2019 15:40   Dg Foot Complete Right  Result Date: 07/12/2019 CLINICAL DATA:  Fall through deck yesterday.  Repeat fall today. EXAM: RIGHT FOOT COMPLETE - 3+ VIEW COMPARISON:  None. FINDINGS: Calcaneal fracture again noted, comminuted and mildly displaced. No significant change since prior study. IMPRESSION: Stable appearance of the comminuted right calcaneal fracture. Electronically Signed   By: Rolm Baptise M.D.   On: 07/12/2019 20:43    EKG: Independently reviewed.  Normal sinus rhythm.  Assessment/Plan Active Problems:   Essential hypertension   Fall   Diabetes mellitus type 2 in obese (HCC)   Closed right calcaneal fracture    1. Recurrent falls with right calcaneal fracture -we will keep patient on pain really medication may consult Dr. Lucia Gaskins of [facility] in the morning to see if patient will need inpatient work-up.  Physical therapy consult. 2. Brief episode of hypotension likely related to pain medication.  Fluid bolus was given will closely monitor. 3. Diabetes mellitus type 2 we will keep patient on sliding scale coverage. 4. Hypothyroidism on Synthroid. 5. Depression on Wellbutrin and Adderall. 6. Normocytic normochromic anemia appears to be chronic.  Follow CBC.  COVID-19 test is pending.   DVT prophylaxis: Heparin. Code Status: Full code. Family Communication: Discussed with patient. Disposition Plan: To be determined. Consults called: None. Admission status: Observation.   [name] MD [facility] Pager [phone].  If 7PM-7AM, please contact night-coverage [email or web address] Password [email or web address]  07/13/2019, 1:09 AM

## 2019-07-13 NOTE — ED Notes (Signed)
ED TO INPATIENT HANDOFF REPORT  ED Nurse Name and Phone #:  Anderson Malta [phone]  S Name/Age/Gender Jenny Cardenas 68 y.o. female Room/Bed: RESB/RESB  Code Status   Code Status: Full Code  Home/SNF/Other Home Patient oriented to: self, place, time and situation Is this baseline? Yes   Triage Complete: Triage complete  Chief Complaint Foot Pain due to Fall  Triage Note Per EMS- Patient fell through a broken deck yesterday and was seen in the ED for the same. Patient has a fractured right foot.  Today, the patient was being taken up steps by her relatives and she fell out of the wheelchair. Patient c/o increased pain to the right foot.   Allergies Allergies  Allergen Reactions  . Other     Etholine Oxide: causes itching and hives, and temporary blindness    Level of Care/Admitting Diagnosis ED Disposition    ED Disposition Condition Comment   Admit  Hospital Area: [facility] [[id number]]  Level of Care: Telemetry [5]  Admit to tele based on following criteria: Monitor for Ischemic changes  Covid Evaluation: Asymptomatic Screening Protocol (No Symptoms)  Diagnosis: Fall [290176]  Admitting Physician: [name] [phone]  Attending Physician: [name] [[id number]]  PT Class (Do Not Modify): Observation [104]  PT Acc Code (Do Not Modify): Observation [10022]       B Medical/Surgery History Past Medical History:  Diagnosis Date  . Anxiety   . Arthritis   . Carpal tunnel syndrome   . Cataract   . Depression   . Diabetes mellitus without complication (HCC)    Type 2  . Fall April 10, 2015  . GERD (gastroesophageal reflux disease)   . Goiter   . History of shingles    On abdomen, left ring finger, and left leg; Pt takes Valtrex  . Hyperlipidemia   . Hypertension   . Obesity   . PTSD (post-traumatic stress disorder)   . Shortness of breath dyspnea    pt stated its related to the back problems she has  . Thyroid disease   .  Ulcer    Past Surgical History:  Procedure Laterality Date  . ABDOMINAL HYSTERECTOMY  1996  . CATARACT EXTRACTION, BILATERAL    . [place]  . COLONOSCOPY W/ POLYPECTOMY    . KYPHOPLASTY N/A 06/02/2015   Procedure: KYPHOPLASTY;  Surgeon: Phylliss Bob, MD;  Location: [facility];  Service: Orthopedics;  Laterality: N/A;  Thoracic 3, 8, 10 kyphoplasty     A IV Location/Drains/Wounds Patient Lines/Drains/Airways Status   Active Line/Drains/Airways    Name:   Placement date:   Placement time:   Site:   Days:   Peripheral IV 07/12/19 Right Antecubital   07/12/19    2126    Antecubital   1          Intake/Output Last 24 hours No intake or output data in the 24 hours ending 07/13/19 4782  Labs/Imaging Results for orders placed or performed during the hospital encounter of 07/12/19 (from the past 48 hour(s))  CBC with Differential     Status: Abnormal   Collection Time: 07/12/19  9:26 PM  Result Value Ref Range   WBC 10.7 (H) 4.0 - 10.5 K/uL   RBC 4.10 3.87 - 5.11 MIL/uL   Hemoglobin 11.3 (L) 12.0 - 15.0 g/dL   HCT 36.5 36.0 - 46.0 %   MCV 89.0 80.0 - 100.0 fL   MCH 27.6 26.0 - 34.0 pg  MCHC 31.0 30.0 - 36.0 g/dL   RDW 13.2 11.5 - 15.5 %   Platelets 256 150 - 400 K/uL   nRBC 0.0 0.0 - 0.2 %   Neutrophils Relative % 70 %   Neutro Abs 7.5 1.7 - 7.7 K/uL   Lymphocytes Relative 21 %   Lymphs Abs 2.2 0.7 - 4.0 K/uL   Monocytes Relative 7 %   Monocytes Absolute 0.8 0.1 - 1.0 K/uL   Eosinophils Relative 1 %   Eosinophils Absolute 0.1 0.0 - 0.5 K/uL   Basophils Relative 1 %   Basophils Absolute 0.1 0.0 - 0.1 K/uL   Immature Granulocytes 0 %   Abs Immature Granulocytes 0.03 0.00 - 0.07 K/uL    Comment: Performed at [facility], [place] [address], [place], [place] [address]  Comprehensive metabolic panel     Status: Abnormal   Collection Time: 07/12/19  9:26 PM  Result Value Ref Range   Sodium 140 135 - 145 mmol/L   Potassium 3.4 (L) 3.5 -  5.1 mmol/L   Chloride 105 98 - 111 mmol/L   CO2 25 22 - 32 mmol/L   Glucose, Bld 115 (H) 70 - 99 mg/dL   BUN 11 8 - 23 mg/dL   Creatinine, Ser 1.01 (H) 0.44 - 1.00 mg/dL   Calcium 8.7 (L) 8.9 - 10.3 mg/dL   Total Protein 7.0 6.5 - 8.1 g/dL   Albumin 3.9 3.5 - 5.0 g/dL   AST 24 15 - 41 U/L   ALT 16 0 - 44 U/L   Alkaline Phosphatase 75 38 - 126 U/L   Total Bilirubin 1.3 (H) 0.3 - 1.2 mg/dL   GFR calc non Af Amer 57 (L) >60 mL/min   GFR calc Af Amer >60 >60 mL/min   Anion gap 10 5 - 15    Comment: Performed at [facility], [place] [address], [place], [place] [address]  CBC     Status: Abnormal   Collection Time: 07/13/19  2:32 AM  Result Value Ref Range   WBC 8.4 4.0 - 10.5 K/uL   RBC 3.98 3.87 - 5.11 MIL/uL   Hemoglobin 11.0 (L) 12.0 - 15.0 g/dL   HCT 35.4 (L) 36.0 - 46.0 %   MCV 88.9 80.0 - 100.0 fL   MCH 27.6 26.0 - 34.0 pg   MCHC 31.1 30.0 - 36.0 g/dL   RDW 13.0 11.5 - 15.5 %   Platelets 241 150 - 400 K/uL   nRBC 0.0 0.0 - 0.2 %    Comment: Performed at [facility], [place] [address] [place], [place] [address]  Creatinine, serum     Status: None   Collection Time: 07/13/19  2:32 AM  Result Value Ref Range   Creatinine, Ser 0.92 0.44 - 1.00 mg/dL   GFR calc non Af Amer >60 >60 mL/min   GFR calc Af Amer >60 >60 mL/min    Comment: Performed at [facility], [place] [address], [place], [place] [address]   Dg Tibia/fibula Right  Result Date: 07/12/2019 CLINICAL DATA:  68 year old female status post fall 3 broken deck yesterday with fractured right foot. Fall from wheelchair today. EXAM: RIGHT TIBIA AND FIBULA - 2 VIEW COMPARISON:  Right foot series today. Right foot CT yesterday. Right knee series 05/03/2017. FINDINGS: Comminuted fracture of the right calcaneus as reported yesterday. Mortise joint alignment appears preserved. Knee joint alignment appears preserved. On the cross-table lateral view there is suggestion of small  right  knee joint effusion. Patella appears stable and intact. No acute right tibia or fibula fracture identified. Sherald Barge move again noted. IMPRESSION: 1. No acute right tibia or fibula fracture identified. Comminuted right calcaneus fracture as reported yesterday. 2. Possible small right knee joint effusion. Electronically Signed   By: Genevie Ann M.D.   On: 07/12/2019 20:45   Dg Ankle Complete Right  Result Date: 07/11/2019 CLINICAL DATA:  Fall.  Ankle swelling. EXAM: RIGHT ANKLE - COMPLETE 3+ VIEW COMPARISON:  None. FINDINGS: Significant soft tissue swelling, particularly laterally. The fibula is intact. Mild irregularity of the medial malleolus may be from an old avulsion injury. No acute tibial fracture noted. There is a comminuted fracture of the calcaneus best appreciated on the lateral view. No significant loss of calcaneal height is identified. The talus is intact. IMPRESSION: 1. There is a comminuted fracture of the calcaneus with displaced fragments. No significant loss of height based on the lateral view. Recommend CT imaging for better evaluation. 2. No other definite fractures are seen. Electronically Signed   By: Dorise Bullion III M.D   On: 07/11/2019 15:39   Ct Head Wo Contrast  Result Date: 07/12/2019 CLINICAL DATA:  68 year old female status post fall yesterday and again today. EXAM: CT HEAD WITHOUT CONTRAST TECHNIQUE: Contiguous axial images were obtained from the base of the skull through the vertex without intravenous contrast. COMPARISON:  Head and cervical spine CT yesterday. FINDINGS: Brain: Cerebral volume is within normal limits for age. No midline shift, ventriculomegaly, mass effect, evidence of mass lesion, intracranial hemorrhage or evidence of cortically based acute infarction. Gray-white matter differentiation is within normal limits throughout the brain. Vascular: Mild Calcified atherosclerosis at the skull base. No suspicious intracranial vascular hyperdensity. Skull: Stable  and intact. Sinuses/Orbits: Visualized paranasal sinuses and mastoids are stable and well pneumatized. Other: No acute orbit or scalp soft tissue finding. IMPRESSION: Stable and normal for age non contrast CT appearance of the brain. Electronically Signed   By: Genevie Ann M.D.   On: 07/12/2019 21:13   Ct Head Wo Contrast  Result Date: 07/11/2019 CLINICAL DATA:  68 year old who fell from a broken deck earlier today, falling approximately 3 feet. Patient denies loss of consciousness. Initial encounter. EXAM: CT HEAD WITHOUT CONTRAST CT CERVICAL SPINE WITHOUT CONTRAST TECHNIQUE: Multidetector CT imaging of the head and cervical spine was performed following the standard protocol without intravenous contrast. Multiplanar CT image reconstructions of the cervical spine were also generated. COMPARISON:  None. FINDINGS: CT HEAD FINDINGS Brain: Ventricular system normal in size and appearance for age. No mass lesion. No midline shift. No acute hemorrhage or hematoma. No extra-axial fluid collections. No evidence of acute infarction. Vascular: Minimal BILATERAL carotid siphon atherosclerosis. No hyperdense vessel. Skull: No skull fracture or other focal osseous abnormality involving the skull. Sinuses/Orbits: Visualized paranasal sinuses, bilateral mastoid air cells and bilateral middle ear cavities well-aerated. Visualized orbits and globes normal in appearance. Other: None. CT CERVICAL SPINE FINDINGS Alignment: Slight reversal of the usual cervical lordosis centered at C4-5. Anatomic posterior alignment. Facet joints anatomically aligned with diffuse degenerative changes. Skull base and vertebrae: No fractures identified involving the cervical spine. Coronal reformatted images demonstrate an intact craniocervical junction, intact dens and intact lateral masses throughout. Soft tissues and spinal canal: No evidence of paraspinous or spinal canal hematoma. No evidence of spinal stenosis. Disc levels: Moderate disc space  changes narrowing at C5-6 and endplate hypertrophic. Remaining disc spaces well-preserved. Degenerative changes at C1-C2. Facet hypertrophy accounts for mild LEFT foraminal  stenosis at C3-4. Remaining neural foramina widely patent. Upper chest: Visualized lung apices clear. Mild atherosclerosis involving the proximal LEFT subclavian artery. Visualized superior mediastinum otherwise unremarkable. Other: Partially calcified nodules involving both lobes of the thyroid gland, the largest measuring approximately 1.6 cm in the RIGHT lobe. IMPRESSION: 1. Normal intracranially. 2. No cervical spine fractures identified. 3. Slight reversal of the usual cervical lordosis centered at C4-5. 4. Degenerative disc disease and spondylosis at C5-6 and C6-7. 5. Partially calcified nodules involving both lobes of the thyroid gland, the largest measuring approximately 1.6 cm in the RIGHT lobe. Further evaluation with non-emergent thyroid ultrasound is recommended. Reference: Recommendations for f/u of Incidental Thyroid Nodules (ITN) found on CT, MR, NM and Extrathyroidal [place] are based upon the ACR white paper and [facility] 3-tiered system for managing ITNs: J Am Coll Radiol. 2015 Feb;12(2): 143-50. Electronically Signed   By: Evangeline Dakin M.D.   On: 07/11/2019 14:55   Ct Chest W Contrast  Result Date: 07/12/2019 CLINICAL DATA:  Fall through broken deck yesterday now fall out of wheelchair EXAM: CT CHEST, abdomen, pelvis WITH CONTRAST TECHNIQUE: Multidetector CT imaging of the chest, abdomen, pelvis was performed during intravenous contrast administration. CONTRAST:  124mL OMNIPAQUE IOHEXOL 300 MG/ML  SOLN COMPARISON:  September 30, 2018 FINDINGS: Cardiovascular: Normal heart size. No significant pericardial fluid/thickening. Great vessels are normal in course and caliber. No evidence of acute thoracic aortic injury. No central pulmonary emboli. Scattered aortic atherosclerotic calcifications are seen without aneurysmal dilatation.  Coronary artery calcifications are seen. Mediastinum/Nodes: No pneumomediastinum. No mediastinal hematoma. Unremarkable esophagus. No axillary, mediastinal or hilar lymphadenopathy.There are bilateral low-density lesions seen throughout the thyroid gland the largest within the right thyroid lobe measuring 1.2 cm. This was seen on a prior CT of 2019. Lungs/Pleura:Minimal ground-glass opacity seen within the right upper lung. For for for no pneumothorax. No pleural effusion. Musculoskeletal: There is cement fixation seen within the T3 T8 and T10 vertebral bodies. There appears to be extruded cement seen anteriorly at the T3 vertebral body. There is chronic slight anterior wedge compression deformity also seen of the T7 vertebral body as on prior MRI 2016 with less than 25% loss in height. No fracture seen in the thorax. Hepatobiliary: There is diffuse low density seen throughout the liver parenchyma. No focal hepatic lesion however is noted. No focal lesion. Gallbladder physiologically distended, no calcified stone. No biliary dilatation. Pancreas: No evidence for traumatic injury. Portions are partially obscured by adjacent bowel loops and paucity of intra-abdominal fat. No ductal dilatation or inflammation. Spleen: Homogeneous attenuation without traumatic injury. Normal in size. Adrenals/Urinary Tract: No adrenal hemorrhage. Kidneys demonstrate symmetric enhancement and excretion on delayed phase imaging. No evidence or renal injury. Ureters are well opacified proximal through mid portion. Bladder is physiologically distended without wall thickening. Stomach/Bowel: Suboptimally assessed without enteric contrast, allowing for this, no evidence of bowel injury. Stomach physiologically distended. There are no dilated or thickened small or large bowel loops. Scattered colonic diverticula without diverticulitis. Moderate stool burden. No evidence of mesenteric hematoma. No free air free fluid. Vascular/Lymphatic: No  acute vascular injury. The abdominal aorta and IVC are intact. Of no evidence of retroperitoneal, abdominal, or pelvic adenopathy. Reproductive: No acute abnormality. The patient is status post hysterectomy. Other: No focal contusion or abnormality of the abdominal wall. Musculoskeletal: No acute fracture of the lumbar spine or bony pelvis. IMPRESSION: 1. No acute intrathoracic, abdominal, or pelvic injury. 2. Prior cement fixation of the T3, T8, T10 vertebral bodies. 3. Chronic  slight anterior compression deformity of the T7 vertebral body with less than 25% loss in height. 4. Diverticula without diverticulitis. 5.  Aortic Atherosclerosis (ICD10-I70.0). Electronically Signed   By: Prudencio Pair M.D.   On: 07/12/2019 23:03   Ct Cervical Spine Wo Contrast  Result Date: 07/12/2019 CLINICAL DATA:  68 year old female status post fall yesterday and again today. EXAM: CT CERVICAL SPINE WITHOUT CONTRAST TECHNIQUE: Multidetector CT imaging of the cervical spine was performed without intravenous contrast. Multiplanar CT image reconstructions were also generated. COMPARISON:  Cervical spine CT yesterday. FINDINGS: Alignment: Stable straightening and mild reversal of cervical lordosis. Cervicothoracic junction alignment is within normal limits. Bilateral posterior element alignment is within normal limits. Skull base and vertebrae: Visualized skull base is intact. No atlanto-occipital dissociation. No acute osseous abnormality identified. Soft tissues and spinal canal: No prevertebral fluid or swelling. No visible canal hematoma. Stable noncontrast visible neck soft tissues, including thyroid heterogeneity detailed yesterday. Disc levels: Chronic anterior C1-C2 joint degeneration with subchondral sclerosis. Lower cervical disc and endplate degeneration but eccentric anteriorly. Upper chest: Mild chronic appearing T1 superior endplate compression. Negative lung apices. IMPRESSION: No acute traumatic injury identified in the  cervical spine. Stable compared to yesterday. Electronically Signed   By: Genevie Ann M.D.   On: 07/12/2019 21:17   Ct Cervical Spine Wo Contrast  Result Date: 07/11/2019 CLINICAL DATA:  68 year old who fell from a broken deck earlier today, falling approximately 3 feet. Patient denies loss of consciousness. Initial encounter. EXAM: CT HEAD WITHOUT CONTRAST CT CERVICAL SPINE WITHOUT CONTRAST TECHNIQUE: Multidetector CT imaging of the head and cervical spine was performed following the standard protocol without intravenous contrast. Multiplanar CT image reconstructions of the cervical spine were also generated. COMPARISON:  None. FINDINGS: CT HEAD FINDINGS Brain: Ventricular system normal in size and appearance for age. No mass lesion. No midline shift. No acute hemorrhage or hematoma. No extra-axial fluid collections. No evidence of acute infarction. Vascular: Minimal BILATERAL carotid siphon atherosclerosis. No hyperdense vessel. Skull: No skull fracture or other focal osseous abnormality involving the skull. Sinuses/Orbits: Visualized paranasal sinuses, bilateral mastoid air cells and bilateral middle ear cavities well-aerated. Visualized orbits and globes normal in appearance. Other: None. CT CERVICAL SPINE FINDINGS Alignment: Slight reversal of the usual cervical lordosis centered at C4-5. Anatomic posterior alignment. Facet joints anatomically aligned with diffuse degenerative changes. Skull base and vertebrae: No fractures identified involving the cervical spine. Coronal reformatted images demonstrate an intact craniocervical junction, intact dens and intact lateral masses throughout. Soft tissues and spinal canal: No evidence of paraspinous or spinal canal hematoma. No evidence of spinal stenosis. Disc levels: Moderate disc space changes narrowing at C5-6 and endplate hypertrophic. Remaining disc spaces well-preserved. Degenerative changes at C1-C2. Facet hypertrophy accounts for mild LEFT foraminal stenosis  at C3-4. Remaining neural foramina widely patent. Upper chest: Visualized lung apices clear. Mild atherosclerosis involving the proximal LEFT subclavian artery. Visualized superior mediastinum otherwise unremarkable. Other: Partially calcified nodules involving both lobes of the thyroid gland, the largest measuring approximately 1.6 cm in the RIGHT lobe. IMPRESSION: 1. Normal intracranially. 2. No cervical spine fractures identified. 3. Slight reversal of the usual cervical lordosis centered at C4-5. 4. Degenerative disc disease and spondylosis at C5-6 and C6-7. 5. Partially calcified nodules involving both lobes of the thyroid gland, the largest measuring approximately 1.6 cm in the RIGHT lobe. Further evaluation with non-emergent thyroid ultrasound is recommended. Reference: Recommendations for f/u of Incidental Thyroid Nodules (ITN) found on CT, MR, NM and Extrathyroidal [place] are  based upon the ACR white paper and [facility] 3-tiered system for managing ITNs: J Am Coll Radiol. 2015 Feb;12(2): 143-50. Electronically Signed   By: Evangeline Dakin M.D.   On: 07/11/2019 14:55   Ct Abdomen Pelvis W Contrast  Result Date: 07/12/2019 CLINICAL DATA:  Fall through broken deck yesterday now fall out of wheelchair EXAM: CT CHEST, abdomen, pelvis WITH CONTRAST TECHNIQUE: Multidetector CT imaging of the chest, abdomen, pelvis was performed during intravenous contrast administration. CONTRAST:  185mL OMNIPAQUE IOHEXOL 300 MG/ML  SOLN COMPARISON:  September 30, 2018 FINDINGS: Cardiovascular: Normal heart size. No significant pericardial fluid/thickening. Great vessels are normal in course and caliber. No evidence of acute thoracic aortic injury. No central pulmonary emboli. Scattered aortic atherosclerotic calcifications are seen without aneurysmal dilatation. Coronary artery calcifications are seen. Mediastinum/Nodes: No pneumomediastinum. No mediastinal hematoma. Unremarkable esophagus. No axillary, mediastinal or hilar  lymphadenopathy.There are bilateral low-density lesions seen throughout the thyroid gland the largest within the right thyroid lobe measuring 1.2 cm. This was seen on a prior CT of 2019. Lungs/Pleura:Minimal ground-glass opacity seen within the right upper lung. For for for no pneumothorax. No pleural effusion. Musculoskeletal: There is cement fixation seen within the T3 T8 and T10 vertebral bodies. There appears to be extruded cement seen anteriorly at the T3 vertebral body. There is chronic slight anterior wedge compression deformity also seen of the T7 vertebral body as on prior MRI 2016 with less than 25% loss in height. No fracture seen in the thorax. Hepatobiliary: There is diffuse low density seen throughout the liver parenchyma. No focal hepatic lesion however is noted. No focal lesion. Gallbladder physiologically distended, no calcified stone. No biliary dilatation. Pancreas: No evidence for traumatic injury. Portions are partially obscured by adjacent bowel loops and paucity of intra-abdominal fat. No ductal dilatation or inflammation. Spleen: Homogeneous attenuation without traumatic injury. Normal in size. Adrenals/Urinary Tract: No adrenal hemorrhage. Kidneys demonstrate symmetric enhancement and excretion on delayed phase imaging. No evidence or renal injury. Ureters are well opacified proximal through mid portion. Bladder is physiologically distended without wall thickening. Stomach/Bowel: Suboptimally assessed without enteric contrast, allowing for this, no evidence of bowel injury. Stomach physiologically distended. There are no dilated or thickened small or large bowel loops. Scattered colonic diverticula without diverticulitis. Moderate stool burden. No evidence of mesenteric hematoma. No free air free fluid. Vascular/Lymphatic: No acute vascular injury. The abdominal aorta and IVC are intact. Of no evidence of retroperitoneal, abdominal, or pelvic adenopathy. Reproductive: No acute abnormality.  The patient is status post hysterectomy. Other: No focal contusion or abnormality of the abdominal wall. Musculoskeletal: No acute fracture of the lumbar spine or bony pelvis. IMPRESSION: 1. No acute intrathoracic, abdominal, or pelvic injury. 2. Prior cement fixation of the T3, T8, T10 vertebral bodies. 3. Chronic slight anterior compression deformity of the T7 vertebral body with less than 25% loss in height. 4. Diverticula without diverticulitis. 5.  Aortic Atherosclerosis (ICD10-I70.0). Electronically Signed   By: Prudencio Pair M.D.   On: 07/12/2019 23:03   Ct Foot Right Wo Contrast  Result Date: 07/11/2019 CLINICAL DATA:  The patient suffered a 3 foot fall through a broken deck today resulting in a right calcaneal fracture. Initial encounter. EXAM: CT OF THE RIGHT FOOT WITHOUT CONTRAST TECHNIQUE: Multidetector CT imaging of the right foot was performed according to the standard protocol. Multiplanar CT image reconstructions were also generated. COMPARISON:  Plain films right ankle today. FINDINGS: Bones/Joint/Cartilage As seen on the comparison plain films, the patient has an acute fracture  of the calcaneus. Main fracture line is oblique in orientation extending from the superior aspect of the calcaneus just posterior to the subtalar joint in an anterior and lateral orientation through the periphery of the calcaneocuboid joint. The fracture is mildly comminuted but only minimally displaced. The fracture does not involve the subtalar joint. No other fracture is identified. Ligaments Suboptimally assessed by CT. Major ligamentous structures appear intact. There is no widening of the syndesmosis. Muscles and Tendons Intact.  No tendon entrapment is seen. Soft tissues There is some soft tissue swelling and hematoma about the ankle. IMPRESSION: Mildly comminuted fracture of the calcaneus extends to the lateral periphery of the calcaneus at calcaneocuboid joint. The fracture does not disrupt the articular  surfaces of the subtalar joint. Negative for tendon entrapment. Electronically Signed   By: Inge Rise M.D.   On: 07/11/2019 16:24   Dg Knee Complete 4 Views Right  Result Date: 07/11/2019 CLINICAL DATA:  Pain after fall EXAM: RIGHT KNEE - COMPLETE 4+ VIEW COMPARISON:  None. FINDINGS: No evidence of fracture, dislocation, or joint effusion. No evidence of arthropathy or other focal bone abnormality. Soft tissues are unremarkable. IMPRESSION: Negative. Electronically Signed   By: Dorise Bullion III M.D   On: 07/11/2019 15:40   Dg Foot Complete Right  Result Date: 07/12/2019 CLINICAL DATA:  Fall through deck yesterday.  Repeat fall today. EXAM: RIGHT FOOT COMPLETE - 3+ VIEW COMPARISON:  None. FINDINGS: Calcaneal fracture again noted, comminuted and mildly displaced. No significant change since prior study. IMPRESSION: Stable appearance of the comminuted right calcaneal fracture. Electronically Signed   By: Rolm Baptise M.D.   On: 07/12/2019 20:43    Pending Labs Unresulted Labs (From admission, onward)    Start     Ordered   07/13/19 0500  CBC  Tomorrow morning,   R     07/13/19 0108   07/13/19 0035  SARS CORONAVIRUS 2 (TAT 6-24 HRS) Nasopharyngeal Nasopharyngeal Swab  (Symptomatic/High Risk of Exposure Patients Labs with Precautions)  Once,   STAT    Question Answer Comment  Is this test for diagnosis or screening Screening   Symptomatic for COVID-19 as defined by CDC No   Hospitalized for COVID-19 No   Admitted to ICU for COVID-19 No   Previously tested for COVID-19 No   Resident in a congregate (group) care setting No   Employed in healthcare setting No   Pregnant No      07/13/19 0034   07/12/19 1957  Urinalysis, Routine w reflex microscopic  Once,   STAT     07/12/19 1956          Vitals/Pain Today's Vitals   07/13/19 0200 07/13/19 0219 07/13/19 0329 07/13/19 0330  BP: 104/60 104/60  (!) 98/55  Pulse: 80 83  75  Resp: (!) 22 (!) 25  14  Temp:      TempSrc:       SpO2: (!) 89% 94%  93%  Weight:      Height:      PainSc:   6      Isolation Precautions No active isolations  Medications Medications  sodium chloride (PF) 0.9 % injection (has no administration in time range)  oxyCODONE-acetaminophen (PERCOCET/ROXICET) 5-325 MG per tablet 1-2 tablet (has no administration in time range)  pravastatin (PRAVACHOL) tablet 10 mg (has no administration in time range)  ALPRAZolam (XANAX) tablet 0.5 mg (0.5 mg Oral Given 07/13/19 0228)  amphetamine-dextroamphetamine (ADDERALL) tablet 10 mg (has no administration in time  range)  buPROPion (WELLBUTRIN XL) 24 hr tablet 150 mg (has no administration in time range)  sertraline (ZOLOFT) tablet 150 mg (has no administration in time range)  levothyroxine (SYNTHROID) tablet 25 mcg (has no administration in time range)  pantoprazole (PROTONIX) EC tablet 40 mg (has no administration in time range)  tiZANidine (ZANAFLEX) tablet 4 mg (4 mg Oral Given 07/13/19 0227)  cycloSPORINE (RESTASIS) 0.05 % ophthalmic emulsion 1 drop (has no administration in time range)  acetaminophen (TYLENOL) tablet 650 mg (650 mg Oral Given 07/13/19 0227)    Or  acetaminophen (TYLENOL) suppository 650 mg ( Rectal See Alternative 07/13/19 0227)  ondansetron (ZOFRAN) tablet 4 mg (has no administration in time range)    Or  ondansetron (ZOFRAN) injection 4 mg (has no administration in time range)  insulin aspart (novoLOG) injection 0-9 Units (has no administration in time range)  heparin injection 5,000 Units (has no administration in time range)  oxyCODONE-acetaminophen (PERCOCET/ROXICET) 5-325 MG per tablet 1 tablet (1 tablet Oral Given 07/12/19 2023)  iohexol (OMNIPAQUE) 300 MG/ML solution 100 mL (100 mLs Intravenous Contrast Given 07/12/19 2234)  fentaNYL (SUBLIMAZE) injection 50 mcg (50 mcg Intravenous Given 07/12/19 2301)  HYDROmorphone (DILAUDID) injection 1 mg (1 mg Intravenous Given 07/13/19 0046)    Mobility walks High fall risk      R Recommendations: See Admitting Provider Note  Report given to:  Corky Sox, RN [phone]

## 2019-07-13 NOTE — Evaluation (Signed)
Physical Therapy Evaluation Patient Details Name: Jenny Cardenas MRN: [id number] DOB: 19-Jul-1951 Today's Date: 07/13/2019   History of Present Illness  68 y.o. female with history of diabetes mellitus type 2, hypothyroidism, hyperlipidemia was brought to the ER for the second time in last 48 hours after patient had another fall.  Patient states the first fall was after patient had tripped on the deck hit her head and a small laceration occipital area and also hit her right foot which led to fracture of the calcaneum  Clinical Impression  Pt admitted with above diagnosis.  Pt currently with functional limitations due to the deficits listed below (see PT Problem List). Pt will benefit from skilled PT to increase their independence and safety with mobility to allow discharge to the venue listed below.  Pt discussed her previous independence prior to first fall with resulting foot fx and difficulty mobilizing at home since.  Pt assisted with standing and requiring mod assist for mobility at this time.  Discussed SNF vs HHPT thoroughly and pt very concerned with SNF due to [place].  Daughter in room and comforting pt.  Recommend SNF due to poor mobility, fall risk and little physical assist available at home.     Follow Up Recommendations SNF;Supervision/Assistance - 24 hour    Equipment Recommendations  Wheelchair cushion (measurements PT);Wheelchair (measurements PT)(with elevating leg rests (if home))    Recommendations for Other Services       Precautions / Restrictions Precautions Precautions: Fall Restrictions Weight Bearing Restrictions: Yes RLE Weight Bearing: Non weight bearing      Mobility  Bed Mobility Overal bed mobility: Needs Assistance Bed Mobility: Supine to Sit;Sit to Supine     Supine to sit: Min assist Sit to supine: Min assist   General bed mobility comments: assist for R LE support  Transfers Overall transfer level: Needs assistance Equipment used: Rolling  walker (2 wheeled) Transfers: Sit to/from Stand Sit to Stand: Mod assist;From elevated surface;+2 physical assistance         General transfer comment: daughter assisted on one side for safety, verbal cues for hand placement, cues for maintaining R LE NWB, pt requires assist to rise, steady and control descent  Ambulation/Gait             General Gait Details: NT - pt too weak, also appears more emotional over assist required and SNF recommendation  Stairs            Wheelchair Mobility    Modified Rankin (Stroke Patients Only)       Balance Overall balance assessment: Needs assistance;History of Falls         Standing balance support: Bilateral upper extremity supported Standing balance-Leahy Scale: Zero Standing balance comment: requiring UE support and external assist                             Pertinent Vitals/Pain Pain Assessment: Faces Faces Pain Scale: Hurts little more Pain Location: R foot Pain Descriptors / Indicators: Aching Pain Intervention(s): Repositioned;Other (comment)(elevated)    Home Living Family/patient expects to be discharged to:: Private residence Living Arrangements: Parent(24 year old mother (very independent for her age per pt)) Available Help at Discharge: Family Type of Home: House Home Access: Stairs to enter   Technical brewer of Steps: 6 Home Layout: One level Home Equipment: Walker - 2 wheels Additional Comments: borrowing light weight w/c    Prior Function Level of Independence: Needs assistance  Gait / Transfers Assistance Needed: recent fall with calcaneal fx and NWB, pt reports doing poorly at home since           Hand Dominance        Extremity/Trunk Assessment        Lower Extremity Assessment Lower Extremity Assessment: Generalized weakness;RLE deficits/detail RLE Deficits / Details: splinted lower leg, unable to wiggle toes, pt reports weakness and pain in hip  flexors/adductors with standing       Communication   Communication: No difficulties  Cognition Arousal/Alertness: Awake/alert Behavior During Therapy: WFL for tasks assessed/performed Overall Cognitive Status: Within Functional Limits for tasks assessed                                        General Comments      Exercises     Assessment/Plan    PT Assessment Patient needs continued PT services  PT Problem List Decreased strength;Decreased activity tolerance;Decreased balance;Decreased knowledge of use of DME;Decreased coordination;Decreased mobility       PT Treatment Interventions DME instruction;Therapeutic activities;Gait training;Therapeutic exercise;Stair training;Functional mobility training;Wheelchair mobility training;Patient/family education;Balance training    PT Goals (Current goals can be found in the Care Plan section)  Acute Rehab PT Goals PT Goal Formulation: With patient Time For Goal Achievement: 07/27/19 Potential to Achieve Goals: Fair    Frequency Min 3X/week   Barriers to discharge        Co-evaluation               AM-PAC PT "6 Clicks" Mobility  Outcome Measure Help needed turning from your back to your side while in a flat bed without using bedrails?: A Little Help needed moving from lying on your back to sitting on the side of a flat bed without using bedrails?: A Lot Help needed moving to and from a bed to a chair (including a wheelchair)?: A Lot Help needed standing up from a chair using your arms (e.g., wheelchair or bedside chair)?: A Lot Help needed to walk in hospital room?: Total Help needed climbing 3-5 steps with a railing? : Total 6 Click Score: 11    End of Session Equipment Utilized During Treatment: Gait belt Activity Tolerance: Patient limited by fatigue;Patient limited by pain Patient left: in bed;with family/visitor present;with call bell/phone within reach;with bed alarm set   PT Visit Diagnosis:  Other abnormalities of gait and mobility (R26.89)    Time: 2595-6387 PT Time Calculation (min) (ACUTE ONLY): 36 min   Charges:   PT Evaluation $PT Eval Low Complexity: 1 Low PT Treatments $Self Care/Home Management: 8-22       Carmelia Bake, PT, DPT Acute Rehabilitation Services Office: [phone] Pager: [phone]   Trena Platt 07/13/2019, 4:34 PM

## 2019-07-14 DIAGNOSIS — E1169 Type 2 diabetes mellitus with other specified complication: Secondary | ICD-10-CM | POA: Diagnosis not present

## 2019-07-14 DIAGNOSIS — Z8349 Family history of other endocrine, nutritional and metabolic diseases: Secondary | ICD-10-CM | POA: Diagnosis not present

## 2019-07-14 DIAGNOSIS — E119 Type 2 diabetes mellitus without complications: Secondary | ICD-10-CM | POA: Diagnosis present

## 2019-07-14 DIAGNOSIS — E785 Hyperlipidemia, unspecified: Secondary | ICD-10-CM | POA: Diagnosis present

## 2019-07-14 DIAGNOSIS — D649 Anemia, unspecified: Secondary | ICD-10-CM | POA: Diagnosis present

## 2019-07-14 DIAGNOSIS — I10 Essential (primary) hypertension: Secondary | ICD-10-CM | POA: Diagnosis present

## 2019-07-14 DIAGNOSIS — S92009A Unspecified fracture of unspecified calcaneus, initial encounter for closed fracture: Secondary | ICD-10-CM | POA: Diagnosis present

## 2019-07-14 DIAGNOSIS — K219 Gastro-esophageal reflux disease without esophagitis: Secondary | ICD-10-CM | POA: Diagnosis present

## 2019-07-14 DIAGNOSIS — Z820 Family history of epilepsy and other diseases of the nervous system: Secondary | ICD-10-CM | POA: Diagnosis not present

## 2019-07-14 DIAGNOSIS — S0101XA Laceration without foreign body of scalp, initial encounter: Secondary | ICD-10-CM | POA: Diagnosis present

## 2019-07-14 DIAGNOSIS — I952 Hypotension due to drugs: Secondary | ICD-10-CM | POA: Diagnosis present

## 2019-07-14 DIAGNOSIS — Z20828 Contact with and (suspected) exposure to other viral communicable diseases: Secondary | ICD-10-CM | POA: Diagnosis present

## 2019-07-14 DIAGNOSIS — Z7984 Long term (current) use of oral hypoglycemic drugs: Secondary | ICD-10-CM | POA: Diagnosis not present

## 2019-07-14 DIAGNOSIS — E669 Obesity, unspecified: Secondary | ICD-10-CM | POA: Diagnosis present

## 2019-07-14 DIAGNOSIS — S92001A Unspecified fracture of right calcaneus, initial encounter for closed fracture: Secondary | ICD-10-CM | POA: Diagnosis present

## 2019-07-14 DIAGNOSIS — Z79899 Other long term (current) drug therapy: Secondary | ICD-10-CM | POA: Diagnosis not present

## 2019-07-14 DIAGNOSIS — T3995XA Adverse effect of unspecified nonopioid analgesic, antipyretic and antirheumatic, initial encounter: Secondary | ICD-10-CM | POA: Diagnosis not present

## 2019-07-14 DIAGNOSIS — W133XXA Fall through floor, initial encounter: Secondary | ICD-10-CM | POA: Diagnosis present

## 2019-07-14 DIAGNOSIS — F329 Major depressive disorder, single episode, unspecified: Secondary | ICD-10-CM | POA: Diagnosis present

## 2019-07-14 DIAGNOSIS — M549 Dorsalgia, unspecified: Secondary | ICD-10-CM | POA: Diagnosis present

## 2019-07-14 DIAGNOSIS — M25561 Pain in right knee: Secondary | ICD-10-CM | POA: Diagnosis present

## 2019-07-14 DIAGNOSIS — M25551 Pain in right hip: Secondary | ICD-10-CM | POA: Diagnosis present

## 2019-07-14 DIAGNOSIS — R296 Repeated falls: Secondary | ICD-10-CM | POA: Diagnosis present

## 2019-07-14 DIAGNOSIS — Z79890 Hormone replacement therapy (postmenopausal): Secondary | ICD-10-CM | POA: Diagnosis not present

## 2019-07-14 DIAGNOSIS — G8929 Other chronic pain: Secondary | ICD-10-CM | POA: Diagnosis present

## 2019-07-14 DIAGNOSIS — E039 Hypothyroidism, unspecified: Secondary | ICD-10-CM | POA: Diagnosis present

## 2019-07-14 DIAGNOSIS — Z6834 Body mass index (BMI) 34.0-34.9, adult: Secondary | ICD-10-CM | POA: Diagnosis not present

## 2019-07-14 LAB — GLUCOSE, CAPILLARY
Glucose-Capillary: 120 mg/dL — ABNORMAL HIGH (ref 70–99)
Glucose-Capillary: 114 mg/dL — ABNORMAL HIGH (ref 70–99)
Glucose-Capillary: 117 mg/dL — ABNORMAL HIGH (ref 70–99)
Glucose-Capillary: 102 mg/dL — ABNORMAL HIGH (ref 70–99)

## 2019-07-14 LAB — BASIC METABOLIC PANEL
Anion gap: 7 (ref 5–15)
BUN: 13 mg/dL (ref 8–23)
CO2: 26 mmol/L (ref 22–32)
Calcium: 8.3 mg/dL — ABNORMAL LOW (ref 8.9–10.3)
Chloride: 108 mmol/L (ref 98–111)
Creatinine, Ser: 0.93 mg/dL (ref 0.44–1.00)
GFR calc Af Amer: >60 mL/min (ref >60)
GFR calc non Af Amer: >60 mL/min (ref >60)
Glucose, Bld: 144 mg/dL — ABNORMAL HIGH (ref 70–99)
Potassium: 4.1 mmol/L (ref 3.5–5.1)
Sodium: 141 mmol/L (ref 135–145)

## 2019-07-14 LAB — MAGNESIUM: Magnesium: 2.1 mg/dL (ref 1.7–2.4)

## 2019-07-14 MED ORDER — TIZANIDINE HCL 4 MG PO TABS: 4.0000 mg | ORAL_TABLET | Freq: Every day | ORAL | 0 refills | Status: DC

## 2019-07-14 MED ORDER — IBUPROFEN-DIPHENHYDRAMINE CIT 200-38 MG PO TABS: 1.0000 | ORAL_TABLET | Freq: Every evening | ORAL | Status: DC | PRN

## 2019-07-14 MED ORDER — AMPHETAMINE-DEXTROAMPHETAMINE 10 MG PO TABS: 10.0000 mg | ORAL_TABLET | Freq: Every morning | ORAL | 0 refills | Status: DC

## 2019-07-14 MED ORDER — DIPHENHYDRAMINE HCL 25 MG PO CAPS
25.0000 mg | ORAL_CAPSULE | Freq: Every evening | ORAL | Status: DC | PRN
  Administered 2019-07-14: 25 mg via ORAL
  Filled 2019-07-14: qty 1

## 2019-07-14 MED ORDER — ALPRAZOLAM 0.5 MG PO TABS: 0.5000 mg | ORAL_TABLET | Freq: Three times a day (TID) | ORAL | 0 refills | Status: DC | PRN

## 2019-07-14 MED ORDER — OXYCODONE-ACETAMINOPHEN 5-325 MG PO TABS: 1.0000 | ORAL_TABLET | Freq: Four times a day (QID) | ORAL | 0 refills | Status: DC | PRN

## 2019-07-14 MED ORDER — IBUPROFEN 200 MG PO TABS
200.0000 mg | ORAL_TABLET | Freq: Every evening | ORAL | Status: DC | PRN
  Administered 2019-07-14: 200 mg via ORAL
  Filled 2019-07-14: qty 1

## 2019-07-14 NOTE — TOC Progression Note (Signed)
Transition of Care [facility]) - Progression Note    Patient Details  Name: CHAROLETT YARROW MRN: [id number] Date of Birth: [birth year]  [facility] CM/SW Contact  Purcell Mouton, RN Phone Number: 07/14/2019, 11:54 AM  Clinical Narrative:    Faxed information to [facility] for [facility]. Pt do not qualify for CIR.    Expected Discharge Plan: [facility](pt is not sure about home or SNF) Barriers to Discharge: No Barriers Identified  Expected Discharge Plan and Services Expected Discharge Plan: [facility](pt is not sure about home or SNF)   Discharge Planning Services: CM Consult   Living arrangements for the past 2 months: Single Family Home                                       Social Determinants of Health (SDOH) Interventions    Readmission Risk Interventions No flowsheet data found.

## 2019-07-14 NOTE — Discharge Summary (Addendum)
Physician Discharge Summary  Jenny Cardenas SEG:[id number] DOB: 07-03-1951 DOA: 07/12/2019  PCP: Maurice Small, MD  Admit date: 07/12/2019  Discharge date: 07/17/2019  Admitted From:Home  Disposition:  SNF/Rehab  Recommendations for Outpatient Follow-up:  1. Follow up with PCP in 1-2 weeks 2. Follow-up with Dr. Lucia Gaskins as recommended [place]: None  Equipment/Devices: None  Discharge Condition: Stable  CODE STATUS: Full  Diet recommendation: Heart Healthy/carb modified  Brief/Interim Summary: Per HPI: Jenny Cardenas a 68 y.o.femalewithhistory of diabetes mellitus type 2, hypothyroidism, hyperlipidemia was brought to the ER for the second time in last 48 hours after patient had another fall. Patient states the first fall was after patient had tripped on the deck hit her head and a small laceration occipital area and also hit her right foot which led to fracture of the calcaneum and was planned to go as outpatient with [facility] Dr. Lucia Gaskins. After reaching home family was trying to help to the next floor with wheelchair and was lifting up the stairs when they lost control and fell back again and this time patient again hit her head but did not lose consciousness. Patient's pain was increased than usual and was brought to the ER.  11/30: Patient seen and evaluated at bedside today. She has pain to her heel that is currently well-controlled. PT evaluation is pending. Covid testing negative. Awaiting call back from orthopedics regarding calcaneal fracture evaluation. Likely home with home health PT in am after PT evaluation today.  12/1: Patient seen and evaluated at bedside with no significant pain noted today.  She has been seen by physical therapy with recommendations for SNF on discharge and this will be arranged today.  She is stable for discharge and we will plan to continue her usual home medications otherwise.  She will need follow-up  with Dr. Lucia Gaskins and this was discussed with him on 11/30.  He will arrange for follow-up office visit after discharge. Patient was in the hospital awaiting for placement.  Addendum on 07/17/19. Discussed Dr. Lucia Gaskins today 12/4 he advises nonoperative intervention continue splint, nonweightbearing on the right foot and follow-up with office next week. Rest of her issues are chronic and stable and she will continue on her home medication.  Discharge Diagnoses:  Active Problems:   Essential hypertension   Fall   Diabetes mellitus type 2 in obese [facility])   Closed right calcaneal fracture   Calcaneal fracture  Principal discharge diagnosis: Recurrent falls with right calcaneal comminuted fracture.  Discharge Instructions   Allergies as of 07/17/2019      Reactions   Other    Etholine Oxide: causes itching and hives, and temporary blindness      Medication List    TAKE these medications   albuterol (2.5 MG/3ML) 0.083% nebulizer solution Commonly known as: PROVENTIL Take 2.5 mg by nebulization every 4 (four) hours as needed for wheezing or shortness of breath.   ALPRAZolam 0.5 MG tablet Commonly known as: XANAX Take 1 tablet (0.5 mg total) by mouth 3 (three) times daily as needed for anxiety.   amphetamine-dextroamphetamine 10 MG tablet Commonly known as: ADDERALL Take 1 tablet (10 mg total) by mouth every morning.   buPROPion 150 MG 24 hr tablet Commonly known as: WELLBUTRIN XL Take 150 mg by mouth daily.   cycloSPORINE 0.05 % ophthalmic emulsion Commonly known as: RESTASIS Place 1 drop into both eyes 2 (two) times daily.   furosemide 20 MG tablet Commonly  known as: LASIX Take 20 mg by mouth daily.   ketoconazole 2 % cream Commonly known as: NIZORAL Apply 1 application topically daily as needed for irritation.   levothyroxine 25 MCG tablet Commonly known as: SYNTHROID Take 25 mcg by mouth daily before breakfast.   metFORMIN 500 MG 24 hr tablet Commonly known as:  GLUCOPHAGE-XR Take 500 mg by mouth every evening.   Motrin PM 200-38 MG Tabs Generic drug: Ibuprofen-diphenhydrAMINE Cit Take 1 tablet by mouth at bedtime as needed (sleep).   mupirocin ointment 2 % Commonly known as: BACTROBAN Apply 1 application topically daily as needed (for mask dermatitis).   oxyCODONE-acetaminophen 5-325 MG tablet Commonly known as: PERCOCET/ROXICET Take 1-2 tablets by mouth every 6 (six) hours as needed for moderate pain or severe pain.   pantoprazole 40 MG tablet Commonly known as: PROTONIX Take 40 mg by mouth daily.   pravastatin 10 MG tablet Commonly known as: PRAVACHOL Take 10 mg by mouth daily.   sertraline 100 MG tablet Commonly known as: ZOLOFT Take 150 mg by mouth daily. Take 150 mg (1 whole tablet and 1/2 tablet) by mouth once daily.   tiZANidine 4 MG tablet Commonly known as: ZANAFLEX Take 1 tablet (4 mg total) by mouth at bedtime.   triamcinolone cream 0.1 % Commonly known as: KENALOG Apply 1 application topically.   valACYclovir 500 MG tablet Commonly known as: Valtrex Take 1 tablet (500 mg total) by mouth daily.   Vitamin B 12 500 MCG Tabs Take 500 mg by mouth once a week.   Vitamin D (Ergocalciferol) 1.25 MG (50000 UT) Caps capsule Commonly known as: DRISDOL 1 CAPSULE ONCE A WEEK X 8 WEEKS      Follow-up Information    Maurice Small, MD Follow up in 1 week(s).   Specialty: Family Medicine Contact information: [place] [id number] [phone]        Erle Crocker, MD Follow up in 4 day(s).   Specialty: Orthopedic Surgery Why: CALL OFFICE FOR F/U EARLY NEXT WEEK Contact information: [place] [id number] [phone]          Allergies  Allergen Reactions  . Other     Etholine Oxide: causes itching and hives, and temporary blindness    Consultations:  None   Procedures/Studies: Dg Tibia/fibula Right  Result Date: 07/12/2019 CLINICAL DATA:   68 year old female status post fall 3 broken deck yesterday with fractured right foot. Fall from wheelchair today. EXAM: RIGHT TIBIA AND FIBULA - 2 VIEW COMPARISON:  Right foot series today. Right foot CT yesterday. Right knee series 05/03/2017. FINDINGS: Comminuted fracture of the right calcaneus as reported yesterday. Mortise joint alignment appears preserved. Knee joint alignment appears preserved. On the cross-table lateral view there is suggestion of small right knee joint effusion. Patella appears stable and intact. No acute right tibia or fibula fracture identified. Sherald Barge move again noted. IMPRESSION: 1. No acute right tibia or fibula fracture identified. Comminuted right calcaneus fracture as reported yesterday. 2. Possible small right knee joint effusion. Electronically Signed   By: Genevie Ann M.D.   On: 07/12/2019 20:45   Dg Ankle Complete Right  Result Date: 07/11/2019 CLINICAL DATA:  Fall.  Ankle swelling. EXAM: RIGHT ANKLE - COMPLETE 3+ VIEW COMPARISON:  None. FINDINGS: Significant soft tissue swelling, particularly laterally. The fibula is intact. Mild irregularity of the medial malleolus may be from an old avulsion injury. No acute tibial fracture noted. There is a comminuted fracture of the  calcaneus best appreciated on the lateral view. No significant loss of calcaneal height is identified. The talus is intact. IMPRESSION: 1. There is a comminuted fracture of the calcaneus with displaced fragments. No significant loss of height based on the lateral view. Recommend CT imaging for better evaluation. 2. No other definite fractures are seen. Electronically Signed   By: Dorise Bullion III M.D   On: 07/11/2019 15:39   Ct Head Wo Contrast  Result Date: 07/12/2019 CLINICAL DATA:  68 year old female status post fall yesterday and again today. EXAM: CT HEAD WITHOUT CONTRAST TECHNIQUE: Contiguous axial images were obtained from the base of the skull through the vertex without intravenous contrast.  COMPARISON:  Head and cervical spine CT yesterday. FINDINGS: Brain: Cerebral volume is within normal limits for age. No midline shift, ventriculomegaly, mass effect, evidence of mass lesion, intracranial hemorrhage or evidence of cortically based acute infarction. Gray-white matter differentiation is within normal limits throughout the brain. Vascular: Mild Calcified atherosclerosis at the skull base. No suspicious intracranial vascular hyperdensity. Skull: Stable and intact. Sinuses/Orbits: Visualized paranasal sinuses and mastoids are stable and well pneumatized. Other: No acute orbit or scalp soft tissue finding. IMPRESSION: Stable and normal for age non contrast CT appearance of the brain. Electronically Signed   By: Genevie Ann M.D.   On: 07/12/2019 21:13   Ct Head Wo Contrast  Result Date: 07/11/2019 CLINICAL DATA:  68 year old who fell from a broken deck earlier today, falling approximately 3 feet. Patient denies loss of consciousness. Initial encounter. EXAM: CT HEAD WITHOUT CONTRAST CT CERVICAL SPINE WITHOUT CONTRAST TECHNIQUE: Multidetector CT imaging of the head and cervical spine was performed following the standard protocol without intravenous contrast. Multiplanar CT image reconstructions of the cervical spine were also generated. COMPARISON:  None. FINDINGS: CT HEAD FINDINGS Brain: Ventricular system normal in size and appearance for age. No mass lesion. No midline shift. No acute hemorrhage or hematoma. No extra-axial fluid collections. No evidence of acute infarction. Vascular: Minimal BILATERAL carotid siphon atherosclerosis. No hyperdense vessel. Skull: No skull fracture or other focal osseous abnormality involving the skull. Sinuses/Orbits: Visualized paranasal sinuses, bilateral mastoid air cells and bilateral middle ear cavities well-aerated. Visualized orbits and globes normal in appearance. Other: None. CT CERVICAL SPINE FINDINGS Alignment: Slight reversal of the usual cervical lordosis  centered at C4-5. Anatomic posterior alignment. Facet joints anatomically aligned with diffuse degenerative changes. Skull base and vertebrae: No fractures identified involving the cervical spine. Coronal reformatted images demonstrate an intact craniocervical junction, intact dens and intact lateral masses throughout. Soft tissues and spinal canal: No evidence of paraspinous or spinal canal hematoma. No evidence of spinal stenosis. Disc levels: Moderate disc space changes narrowing at C5-6 and endplate hypertrophic. Remaining disc spaces well-preserved. Degenerative changes at C1-C2. Facet hypertrophy accounts for mild LEFT foraminal stenosis at C3-4. Remaining neural foramina widely patent. Upper chest: Visualized lung apices clear. Mild atherosclerosis involving the proximal LEFT subclavian artery. Visualized superior mediastinum otherwise unremarkable. Other: Partially calcified nodules involving both lobes of the thyroid gland, the largest measuring approximately 1.6 cm in the RIGHT lobe. IMPRESSION: 1. Normal intracranially. 2. No cervical spine fractures identified. 3. Slight reversal of the usual cervical lordosis centered at C4-5. 4. Degenerative disc disease and spondylosis at C5-6 and C6-7. 5. Partially calcified nodules involving both lobes of the thyroid gland, the largest measuring approximately 1.6 cm in the RIGHT lobe. Further evaluation with non-emergent thyroid ultrasound is recommended. Reference: Recommendations for f/u of Incidental Thyroid Nodules (ITN) found on CT, MR,  NM and Extrathyroidal [place] are based upon the ACR white paper and [facility] 3-tiered system for managing ITNs: J Am Coll Radiol. 2015 Feb;12(2): 143-50. Electronically Signed   By: Evangeline Dakin M.D.   On: 07/11/2019 14:55   Ct Chest W Contrast  Result Date: 07/12/2019 CLINICAL DATA:  Fall through broken deck yesterday now fall out of wheelchair EXAM: CT CHEST, abdomen, pelvis WITH CONTRAST TECHNIQUE: Multidetector CT imaging  of the chest, abdomen, pelvis was performed during intravenous contrast administration. CONTRAST:  149mL OMNIPAQUE IOHEXOL 300 MG/ML  SOLN COMPARISON:  September 30, 2018 FINDINGS: Cardiovascular: Normal heart size. No significant pericardial fluid/thickening. Great vessels are normal in course and caliber. No evidence of acute thoracic aortic injury. No central pulmonary emboli. Scattered aortic atherosclerotic calcifications are seen without aneurysmal dilatation. Coronary artery calcifications are seen. Mediastinum/Nodes: No pneumomediastinum. No mediastinal hematoma. Unremarkable esophagus. No axillary, mediastinal or hilar lymphadenopathy.There are bilateral low-density lesions seen throughout the thyroid gland the largest within the right thyroid lobe measuring 1.2 cm. This was seen on a prior CT of 2019. Lungs/Pleura:Minimal ground-glass opacity seen within the right upper lung. For for for no pneumothorax. No pleural effusion. Musculoskeletal: There is cement fixation seen within the T3 T8 and T10 vertebral bodies. There appears to be extruded cement seen anteriorly at the T3 vertebral body. There is chronic slight anterior wedge compression deformity also seen of the T7 vertebral body as on prior MRI 2016 with less than 25% loss in height. No fracture seen in the thorax. Hepatobiliary: There is diffuse low density seen throughout the liver parenchyma. No focal hepatic lesion however is noted. No focal lesion. Gallbladder physiologically distended, no calcified stone. No biliary dilatation. Pancreas: No evidence for traumatic injury. Portions are partially obscured by adjacent bowel loops and paucity of intra-abdominal fat. No ductal dilatation or inflammation. Spleen: Homogeneous attenuation without traumatic injury. Normal in size. Adrenals/Urinary Tract: No adrenal hemorrhage. Kidneys demonstrate symmetric enhancement and excretion on delayed phase imaging. No evidence or renal injury. Ureters are well  opacified proximal through mid portion. Bladder is physiologically distended without wall thickening. Stomach/Bowel: Suboptimally assessed without enteric contrast, allowing for this, no evidence of bowel injury. Stomach physiologically distended. There are no dilated or thickened small or large bowel loops. Scattered colonic diverticula without diverticulitis. Moderate stool burden. No evidence of mesenteric hematoma. No free air free fluid. Vascular/Lymphatic: No acute vascular injury. The abdominal aorta and IVC are intact. Of no evidence of retroperitoneal, abdominal, or pelvic adenopathy. Reproductive: No acute abnormality. The patient is status post hysterectomy. Other: No focal contusion or abnormality of the abdominal wall. Musculoskeletal: No acute fracture of the lumbar spine or bony pelvis. IMPRESSION: 1. No acute intrathoracic, abdominal, or pelvic injury. 2. Prior cement fixation of the T3, T8, T10 vertebral bodies. 3. Chronic slight anterior compression deformity of the T7 vertebral body with less than 25% loss in height. 4. Diverticula without diverticulitis. 5.  Aortic Atherosclerosis (ICD10-I70.0). Electronically Signed   By: Prudencio Pair M.D.   On: 07/12/2019 23:03   Ct Cervical Spine Wo Contrast  Result Date: 07/12/2019 CLINICAL DATA:  68 year old female status post fall yesterday and again today. EXAM: CT CERVICAL SPINE WITHOUT CONTRAST TECHNIQUE: Multidetector CT imaging of the cervical spine was performed without intravenous contrast. Multiplanar CT image reconstructions were also generated. COMPARISON:  Cervical spine CT yesterday. FINDINGS: Alignment: Stable straightening and mild reversal of cervical lordosis. Cervicothoracic junction alignment is within normal limits. Bilateral posterior element alignment is within normal limits. Skull base  and vertebrae: Visualized skull base is intact. No atlanto-occipital dissociation. No acute osseous abnormality identified. Soft tissues and spinal  canal: No prevertebral fluid or swelling. No visible canal hematoma. Stable noncontrast visible neck soft tissues, including thyroid heterogeneity detailed yesterday. Disc levels: Chronic anterior C1-C2 joint degeneration with subchondral sclerosis. Lower cervical disc and endplate degeneration but eccentric anteriorly. Upper chest: Mild chronic appearing T1 superior endplate compression. Negative lung apices. IMPRESSION: No acute traumatic injury identified in the cervical spine. Stable compared to yesterday. Electronically Signed   By: Genevie Ann M.D.   On: 07/12/2019 21:17   Ct Cervical Spine Wo Contrast  Result Date: 07/11/2019 CLINICAL DATA:  68 year old who fell from a broken deck earlier today, falling approximately 3 feet. Patient denies loss of consciousness. Initial encounter. EXAM: CT HEAD WITHOUT CONTRAST CT CERVICAL SPINE WITHOUT CONTRAST TECHNIQUE: Multidetector CT imaging of the head and cervical spine was performed following the standard protocol without intravenous contrast. Multiplanar CT image reconstructions of the cervical spine were also generated. COMPARISON:  None. FINDINGS: CT HEAD FINDINGS Brain: Ventricular system normal in size and appearance for age. No mass lesion. No midline shift. No acute hemorrhage or hematoma. No extra-axial fluid collections. No evidence of acute infarction. Vascular: Minimal BILATERAL carotid siphon atherosclerosis. No hyperdense vessel. Skull: No skull fracture or other focal osseous abnormality involving the skull. Sinuses/Orbits: Visualized paranasal sinuses, bilateral mastoid air cells and bilateral middle ear cavities well-aerated. Visualized orbits and globes normal in appearance. Other: None. CT CERVICAL SPINE FINDINGS Alignment: Slight reversal of the usual cervical lordosis centered at C4-5. Anatomic posterior alignment. Facet joints anatomically aligned with diffuse degenerative changes. Skull base and vertebrae: No fractures identified involving the  cervical spine. Coronal reformatted images demonstrate an intact craniocervical junction, intact dens and intact lateral masses throughout. Soft tissues and spinal canal: No evidence of paraspinous or spinal canal hematoma. No evidence of spinal stenosis. Disc levels: Moderate disc space changes narrowing at C5-6 and endplate hypertrophic. Remaining disc spaces well-preserved. Degenerative changes at C1-C2. Facet hypertrophy accounts for mild LEFT foraminal stenosis at C3-4. Remaining neural foramina widely patent. Upper chest: Visualized lung apices clear. Mild atherosclerosis involving the proximal LEFT subclavian artery. Visualized superior mediastinum otherwise unremarkable. Other: Partially calcified nodules involving both lobes of the thyroid gland, the largest measuring approximately 1.6 cm in the RIGHT lobe. IMPRESSION: 1. Normal intracranially. 2. No cervical spine fractures identified. 3. Slight reversal of the usual cervical lordosis centered at C4-5. 4. Degenerative disc disease and spondylosis at C5-6 and C6-7. 5. Partially calcified nodules involving both lobes of the thyroid gland, the largest measuring approximately 1.6 cm in the RIGHT lobe. Further evaluation with non-emergent thyroid ultrasound is recommended. Reference: Recommendations for f/u of Incidental Thyroid Nodules (ITN) found on CT, MR, NM and Extrathyroidal [place] are based upon the ACR white paper and [facility] 3-tiered system for managing ITNs: J Am Coll Radiol. 2015 Feb;12(2): 143-50. Electronically Signed   By: Evangeline Dakin M.D.   On: 07/11/2019 14:55   Ct Abdomen Pelvis W Contrast  Result Date: 07/12/2019 CLINICAL DATA:  Fall through broken deck yesterday now fall out of wheelchair EXAM: CT CHEST, abdomen, pelvis WITH CONTRAST TECHNIQUE: Multidetector CT imaging of the chest, abdomen, pelvis was performed during intravenous contrast administration. CONTRAST:  145mL OMNIPAQUE IOHEXOL 300 MG/ML  SOLN COMPARISON:  September 30, 2018  FINDINGS: Cardiovascular: Normal heart size. No significant pericardial fluid/thickening. Great vessels are normal in course and caliber. No evidence of acute thoracic aortic injury. No central  pulmonary emboli. Scattered aortic atherosclerotic calcifications are seen without aneurysmal dilatation. Coronary artery calcifications are seen. Mediastinum/Nodes: No pneumomediastinum. No mediastinal hematoma. Unremarkable esophagus. No axillary, mediastinal or hilar lymphadenopathy.There are bilateral low-density lesions seen throughout the thyroid gland the largest within the right thyroid lobe measuring 1.2 cm. This was seen on a prior CT of 2019. Lungs/Pleura:Minimal ground-glass opacity seen within the right upper lung. For for for no pneumothorax. No pleural effusion. Musculoskeletal: There is cement fixation seen within the T3 T8 and T10 vertebral bodies. There appears to be extruded cement seen anteriorly at the T3 vertebral body. There is chronic slight anterior wedge compression deformity also seen of the T7 vertebral body as on prior MRI 2016 with less than 25% loss in height. No fracture seen in the thorax. Hepatobiliary: There is diffuse low density seen throughout the liver parenchyma. No focal hepatic lesion however is noted. No focal lesion. Gallbladder physiologically distended, no calcified stone. No biliary dilatation. Pancreas: No evidence for traumatic injury. Portions are partially obscured by adjacent bowel loops and paucity of intra-abdominal fat. No ductal dilatation or inflammation. Spleen: Homogeneous attenuation without traumatic injury. Normal in size. Adrenals/Urinary Tract: No adrenal hemorrhage. Kidneys demonstrate symmetric enhancement and excretion on delayed phase imaging. No evidence or renal injury. Ureters are well opacified proximal through mid portion. Bladder is physiologically distended without wall thickening. Stomach/Bowel: Suboptimally assessed without enteric contrast, allowing  for this, no evidence of bowel injury. Stomach physiologically distended. There are no dilated or thickened small or large bowel loops. Scattered colonic diverticula without diverticulitis. Moderate stool burden. No evidence of mesenteric hematoma. No free air free fluid. Vascular/Lymphatic: No acute vascular injury. The abdominal aorta and IVC are intact. Of no evidence of retroperitoneal, abdominal, or pelvic adenopathy. Reproductive: No acute abnormality. The patient is status post hysterectomy. Other: No focal contusion or abnormality of the abdominal wall. Musculoskeletal: No acute fracture of the lumbar spine or bony pelvis. IMPRESSION: 1. No acute intrathoracic, abdominal, or pelvic injury. 2. Prior cement fixation of the T3, T8, T10 vertebral bodies. 3. Chronic slight anterior compression deformity of the T7 vertebral body with less than 25% loss in height. 4. Diverticula without diverticulitis. 5.  Aortic Atherosclerosis (ICD10-I70.0). Electronically Signed   By: Prudencio Pair M.D.   On: 07/12/2019 23:03   Ct Foot Right Wo Contrast  Result Date: 07/11/2019 CLINICAL DATA:  The patient suffered a 3 foot fall through a broken deck today resulting in a right calcaneal fracture. Initial encounter. EXAM: CT OF THE RIGHT FOOT WITHOUT CONTRAST TECHNIQUE: Multidetector CT imaging of the right foot was performed according to the standard protocol. Multiplanar CT image reconstructions were also generated. COMPARISON:  Plain films right ankle today. FINDINGS: Bones/Joint/Cartilage As seen on the comparison plain films, the patient has an acute fracture of the calcaneus. Main fracture line is oblique in orientation extending from the superior aspect of the calcaneus just posterior to the subtalar joint in an anterior and lateral orientation through the periphery of the calcaneocuboid joint. The fracture is mildly comminuted but only minimally displaced. The fracture does not involve the subtalar joint. No other  fracture is identified. Ligaments Suboptimally assessed by CT. Major ligamentous structures appear intact. There is no widening of the syndesmosis. Muscles and Tendons Intact.  No tendon entrapment is seen. Soft tissues There is some soft tissue swelling and hematoma about the ankle. IMPRESSION: Mildly comminuted fracture of the calcaneus extends to the lateral periphery of the calcaneus at calcaneocuboid joint. The fracture does  not disrupt the articular surfaces of the subtalar joint. Negative for tendon entrapment. Electronically Signed   By: Inge Rise M.D.   On: 07/11/2019 16:24   Dg Knee Complete 4 Views Right  Result Date: 07/11/2019 CLINICAL DATA:  Pain after fall EXAM: RIGHT KNEE - COMPLETE 4+ VIEW COMPARISON:  None. FINDINGS: No evidence of fracture, dislocation, or joint effusion. No evidence of arthropathy or other focal bone abnormality. Soft tissues are unremarkable. IMPRESSION: Negative. Electronically Signed   By: Dorise Bullion III M.D   On: 07/11/2019 15:40   Dg Foot Complete Right  Result Date: 07/12/2019 CLINICAL DATA:  Fall through deck yesterday.  Repeat fall today. EXAM: RIGHT FOOT COMPLETE - 3+ VIEW COMPARISON:  None. FINDINGS: Calcaneal fracture again noted, comminuted and mildly displaced. No significant change since prior study. IMPRESSION: Stable appearance of the comminuted right calcaneal fracture. Electronically Signed   By: Rolm Baptise M.D.   On: 07/12/2019 20:43    Discharge Exam: Vitals:   07/16/19 2115 07/17/19 0431  BP: 138/67 128/67  Pulse: 73 68  Resp: 20 18  Temp: 99.1 F (37.3 C) 99 F (37.2 C)  SpO2: 98% 97%   Vitals:   07/16/19 0551 07/16/19 1346 07/16/19 2115 07/17/19 0431  BP: 119/62 132/62 138/67 128/67  Pulse: 75 74 73 68  Resp: 18 20 20 18   Temp: 98.6 F (37 C) 98 F (36.7 C) 99.1 F (37.3 C) 99 F (37.2 C)  TempSrc: Oral Oral Oral Oral  SpO2: 96% 99% 98% 97%  Weight:      Height:        General: Pt is alert, awake, not in  acute distress Cardiovascular: RRR, S1/S2 +, no rubs, no gallops Respiratory: CTA bilaterally, no wheezing, no rhonchi Abdominal: Soft, NT, ND, bowel sounds + Extremities: no edema, no cyanosis, right lower extremity in splint.  Some ecchymosis noted, but no significant swelling.  Warm to touch.  Capillary refill intact.    The results of significant diagnostics from this hospitalization (including imaging, microbiology, ancillary and laboratory) are listed below for reference.     Microbiology: Recent Results (from the past 240 hour(s))  SARS CORONAVIRUS 2 (TAT 6-24 HRS) Nasopharyngeal Nasopharyngeal Swab     Status: None   Collection Time: 07/13/19 12:48 AM   Specimen: Nasopharyngeal Swab  Result Value Ref Range Status   SARS Coronavirus 2 NEGATIVE NEGATIVE Final    Comment: (NOTE) SARS-CoV-2 target nucleic acids are NOT DETECTED. The SARS-CoV-2 RNA is generally detectable in upper and lower respiratory specimens during the acute phase of infection. Negative results do not preclude SARS-CoV-2 infection, do not rule out co-infections with other pathogens, and should not be used as the sole basis for treatment or other patient management decisions. Negative results must be combined with clinical observations, patient history, and epidemiological information. The expected result is Negative. Fact Sheet for Patients: SugarRoll.be Fact Sheet for Healthcare Providers: https://www.woods-mathews.com/ This test is not yet approved or cleared by the [place] FDA and  has been authorized for detection and/or diagnosis of SARS-CoV-2 by FDA under an Emergency Use Authorization (EUA). This EUA will remain  in effect (meaning this test can be used) for the duration of the COVID-19 declaration under Section 56 4(b)(1) of the Act, 21 U.S.C. section 360bbb-3(b)(1), unless the authorization is terminated or revoked sooner. Performed at [facility], [place] [address], [place], [place] [address]      Labs: BNP (last 3 results) No results for input(s):  BNP in the last 8760 hours. Basic Metabolic Panel: Recent Labs  Lab 07/12/19 2126 07/13/19 0232 07/14/19 0412  NA 140  --  141  K 3.4*  --  4.1  CL 105  --  108  CO2 25  --  26  GLUCOSE 115*  --  144*  BUN 11  --  13  CREATININE 1.01* 0.92 0.93  CALCIUM 8.7*  --  8.3*  MG  --   --  2.1   Liver Function Tests: Recent Labs  Lab 07/12/19 2126  AST 24  ALT 16  ALKPHOS 75  BILITOT 1.3*  PROT 7.0  ALBUMIN 3.9   No results for input(s): LIPASE, AMYLASE in the last 168 hours. No results for input(s): AMMONIA in the last 168 hours. CBC: Recent Labs  Lab 07/12/19 2126 07/13/19 0232 07/13/19 0537  WBC 10.7* 8.4 8.3  NEUTROABS 7.5  --   --   HGB 11.3* 11.0* 10.3*  HCT 36.5 35.4* 32.8*  MCV 89.0 88.9 91.1  PLT 256 241 219   Cardiac Enzymes: No results for input(s): CKTOTAL, CKMB, CKMBINDEX, TROPONINI in the last 168 hours. BNP: Invalid input(s): POCBNP CBG: Recent Labs  Lab 07/16/19 0750 07/16/19 1202 07/16/19 1647 07/16/19 2155 07/17/19 0748  GLUCAP 129* 83 116* 117* 107*   D-Dimer No results for input(s): DDIMER in the last 72 hours. Hgb A1c No results for input(s): HGBA1C in the last 72 hours. Lipid Profile No results for input(s): CHOL, HDL, LDLCALC, TRIG, CHOLHDL, LDLDIRECT in the last 72 hours. Thyroid function studies No results for input(s): TSH, T4TOTAL, T3FREE, THYROIDAB in the last 72 hours.  Invalid input(s): FREET3 Anemia work up No results for input(s): VITAMINB12, FOLATE, FERRITIN, TIBC, IRON, RETICCTPCT in the last 72 hours. Urinalysis    Component Value Date/Time   COLORURINE YELLOW 07/12/2019 2126   APPEARANCEUR CLEAR 07/12/2019 2126   APPEARANCEUR Clear 04/21/2019 1456   LABSPEC 1.031 (H) 07/12/2019 2126   PHURINE 5.0 07/12/2019 2126   GLUCOSEU NEGATIVE 07/12/2019 2126   HGBUR SMALL (A) 07/12/2019 2126   HGBUR 2+  04/04/2010 0843   BILIRUBINUR NEGATIVE 07/12/2019 2126   BILIRUBINUR Negative 04/21/2019 [place] 07/12/2019 2126   PROTEINUR NEGATIVE 07/12/2019 2126   UROBILINOGEN 0.2 06/01/2015 1347   NITRITE NEGATIVE 07/12/2019 2126   LEUKOCYTESUR SMALL (A) 07/12/2019 2126   Sepsis Labs Invalid input(s): PROCALCITONIN,  WBC,  LACTICIDVEN Microbiology Recent Results (from the past 240 hour(s))  SARS CORONAVIRUS 2 (TAT 6-24 HRS) Nasopharyngeal Nasopharyngeal Swab     Status: None   Collection Time: 07/13/19 12:48 AM   Specimen: Nasopharyngeal Swab  Result Value Ref Range Status   SARS Coronavirus 2 NEGATIVE NEGATIVE Final    Comment: (NOTE) SARS-CoV-2 target nucleic acids are NOT DETECTED. The SARS-CoV-2 RNA is generally detectable in upper and lower respiratory specimens during the acute phase of infection. Negative results do not preclude SARS-CoV-2 infection, do not rule out co-infections with other pathogens, and should not be used as the sole basis for treatment or other patient management decisions. Negative results must be combined with clinical observations, patient history, and epidemiological information. The expected result is Negative. Fact Sheet for Patients: SugarRoll.be Fact Sheet for Healthcare Providers: https://www.woods-mathews.com/ This test is not yet approved or cleared by the [place] FDA and  has been authorized for detection and/or diagnosis of SARS-CoV-2 by FDA under an Emergency Use Authorization (EUA). This EUA will remain  in effect (meaning this test can be used) for  the duration of the COVID-19 declaration under Section 56 4(b)(1) of the Act, 21 U.S.C. section 360bbb-3(b)(1), unless the authorization is terminated or revoked sooner. Performed at [facility], [place] [address] [place], [place] [address]      Time coordinating discharge: 35 minutes  SIGNED:   Antonieta Pert, DO [facility] 07/17/2019, 7:54 AM  If 7PM-7AM, please contact night-coverage [email or web address]

## 2019-07-14 NOTE — Care Management Obs Status (Signed)
[facility] NOTIFICATION   Patient Details  Name: Jenny Cardenas MRN: [id number] Date of Birth: 05-Jun-1951   Medicare Observation Status Notification Given:  Yes    Purcell Mouton, RN 07/14/2019, 12:25 PM

## 2019-07-14 NOTE — NC FL2 (Addendum)
[place] LEVEL OF CARE SCREENING TOOL     IDENTIFICATION  Patient Name: Jenny Cardenas Birthdate: June 01, 1951 Sex: female Admission Date (Current Location): 07/12/2019  [facility] and [place] Number:  Herbalist and Address:   [facility]  [address] [place], [place] [address]      Provider Number: [id number]  Attending Physician Name and Address:  Rodena Goldmann, DO  Relative Name and Phone Number:  Jovita Gamma [phone]    Current Level of Care: Hospital Recommended Level of Care: [place] Prior Approval Number:    Date Approved/Denied:   PASRR Number:    Discharge Plan: SNF    Current Diagnoses: Patient Active Problem List   Diagnosis Date Noted  . Calcaneal fracture 07/14/2019  . Fall 07/13/2019  . Diabetes mellitus type 2 in obese ([place]) 07/13/2019  . Closed right calcaneal fracture 07/13/2019  . Acute pyelonephritis 10/01/2018  . Acute diverticulitis 10/01/2018  . Primary osteoarthritis of both hands 04/26/2017  . History of vertebral fracture s/p kyphoplasty  04/26/2017  . Hyperthyroidism 09/28/2013  . Multinodular goiter 09/28/2013  . Rash and nonspecific skin eruption 01/31/2013  . Rash and other nonspecific skin eruption 10/16/2010  . DIAB W/OTH MANIFESTS TYPE II/UNS TYPE UNCNTRL 04/10/2010  . OSTEOPENIA 08/27/2008  . UNS ADVRS EFF UNS RX MEDICINAL&BIOLOGICAL SBSTNC 09/18/2007  . CARPAL TUNNEL SYNDROME 03/04/2007  . ACTINIC KERATOSIS 03/04/2007  . DEPRESSION 02/13/2007  . HYPERLIPIDEMIA 12/25/2006  . OBESITY 12/25/2006  . Essential hypertension 12/25/2006    Orientation RESPIRATION BLADDER Height & Weight     Self, Time, Situation, Place  Normal Continent Weight: 86 kg Height:  5\' 2"  (157.5 cm)  BEHAVIORAL SYMPTOMS/MOOD NEUROLOGICAL BOWEL NUTRITION STATUS      Continent Diet(Heart Healthy)  AMBULATORY STATUS COMMUNICATION OF NEEDS Skin   Extensive Assist Verbally Other (Comment)(Right leg fracture)                        Personal Care Assistance Level of Assistance  Bathing, Feeding, Dressing Bathing Assistance: Limited assistance Feeding assistance: Independent Dressing Assistance: Maximum assistance     Functional Limitations Info  Sight, Hearing, Speech Sight Info: Adequate Hearing Info: Adequate Speech Info: Adequate    SPECIAL CARE FACTORS FREQUENCY  PT (By licensed PT), OT (By licensed OT)     PT Frequency: Eval and Treat OT Frequency: Eval and Treat            Contractures Contractures Info: Not present    Additional Factors Info  Code Status, Allergies Code Status Info: FULL Allergies Info: Etholine Oxide           Current Medications (07/14/2019):  This is the current hospital active medication list Current Facility-Administered Medications  Medication Dose Route Frequency Provider Last Rate Last Dose  . acetaminophen (TYLENOL) tablet 650 mg  650 mg Oral Q6H PRN [name], MD   650 mg at 07/13/19 8299   Or  . acetaminophen (TYLENOL) suppository 650 mg  650 mg Rectal Q6H PRN [name], MD      . ALPRAZolam Duanne Moron) tablet 0.5 mg  0.5 mg Oral TID PRN [name], MD   0.5 mg at 07/14/19 1436  . amphetamine-dextroamphetamine (ADDERALL) tablet 10 mg  10 mg Oral q morning - 10a [name], MD   10 mg at 07/14/19 0930  . buPROPion (WELLBUTRIN XL) 24 hr tablet 150 mg  150 mg Oral Daily [name],  MD   150 mg at 07/14/19 0929  . cycloSPORINE (RESTASIS) 0.05 % ophthalmic emulsion 1 drop  1 drop Both Eyes BID [name], MD   1 drop at 07/13/19 2124  . ibuprofen (ADVIL) tablet 200 mg  200 mg Oral QHS PRN Manuella Ghazi, Pratik D, DO       And  . diphenhydrAMINE (BENADRYL) capsule 25 mg  25 mg Oral QHS PRN Manuella Ghazi, Pratik D, DO      . heparin injection 5,000 Units  5,000 Units Subcutaneous Q8H [name], MD   5,000 Units at 07/14/19 1435  . insulin aspart (novoLOG) injection 0-9 Units  0-9 Units  Subcutaneous TID WC [name], MD      . levothyroxine (SYNTHROID) tablet 25 mcg  25 mcg Oral QAC breakfast [name], MD   25 mcg at 07/14/19 0537  . ondansetron (ZOFRAN) tablet 4 mg  4 mg Oral Q6H PRN [name], MD       Or  . ondansetron [facility]) injection 4 mg  4 mg Intravenous Q6H PRN [name], MD      . oxyCODONE-acetaminophen (PERCOCET/ROXICET) 5-325 MG per tablet 1-2 tablet  1-2 tablet Oral Q4H PRN [name], MD   2 tablet at 07/14/19 1206  . pantoprazole (PROTONIX) EC tablet 40 mg  40 mg Oral Daily [name], MD   40 mg at 07/14/19 1410  . pravastatin (PRAVACHOL) tablet 10 mg  10 mg Oral q1800 [name], MD   10 mg at 07/13/19 1725  . sertraline (ZOLOFT) tablet 150 mg  150 mg Oral Daily [name], MD   150 mg at 07/14/19 3013  . tiZANidine (ZANAFLEX) tablet 4 mg  4 mg Oral QHS [name], MD   4 mg at 07/13/19 1438     Discharge Medications: Please see discharge summary for a list of discharge medications.  Relevant Imaging Results:  Relevant Lab Results:   Additional Information OI#[id number]  Purcell Mouton, RN

## 2019-07-14 NOTE — Progress Notes (Addendum)
Rehab Admissions Coordinator Note:  Per PT recommendation and CM request, patient was screened by Michel Santee for appropriateness for an Inpatient Acute Rehab Consult.  At this time, pt does not appear to have the medical necessity to warrant close physician following at an inpt rehab facility.  Would recommend f/u at lower level of care for rehab needs.   Michel Santee 07/14/2019, 10:29 AM  I can be reached at [phone]

## 2019-07-15 DIAGNOSIS — I10 Essential (primary) hypertension: Secondary | ICD-10-CM

## 2019-07-15 LAB — GLUCOSE, CAPILLARY
Glucose-Capillary: 109 mg/dL — ABNORMAL HIGH (ref 70–99)
Glucose-Capillary: 111 mg/dL — ABNORMAL HIGH (ref 70–99)
Glucose-Capillary: 87 mg/dL (ref 70–99)
Glucose-Capillary: 143 mg/dL — ABNORMAL HIGH (ref 70–99)

## 2019-07-15 NOTE — Progress Notes (Signed)
PROGRESS NOTE    Jenny Cardenas  DVV:[id number] DOB: [birth year] DOA: 07/12/2019 PCP: Maurice Small, MD   Brief Narrative: As per HPI 68 year old female with T2DM, hypothyroidism, hyperlipidemia brought to the ER second time in 48 hours with another fall.  Patient had sustained perianal fracture the first fall.  Ortho was consulted and planning for outpatient evaluation by Dr. Lucia Gaskins. Seen by PT OT, at this time waiting for skilled nursing facility placement  Subjective:  Seen and examined this morning. Reports pain is much improved compared to prior. No  Fever overnight. Last blood work-up 12/1, 11/20  with hemoglobin 10.3, creatinine 0.9.  Assessment & Plan:  Multiple falls at home with rt calcaneal fracture-PT OT eval and orthopedic follow-up.  Needs placement to SNF for rehab, given recurrent falls, unsafe discharge to home  Closed right calcaneal fracture: Outpatient follow-up with orthopedic.  Essential hypertension-Brief episode of hypotension likely from medication.  Patient was given fluid bolus previously.  Blood pressure controlled.  Diabetes mellitus type 2 in obese ,hemoglobi to 6.5 controlled. ON SSI.  Normochromic anemia appears to be chronic.  Depression on Wellbutrin, Zoloft and Adderall.  Hypothyroidism On Synthroid  HLD on statin  Body mass index is 34.68 kg/m.    DVT prophylaxis: SCD/Heparin Code Status: full Family Communication: plan of care discussed with patient at bedside. Disposition Plan: Remains inpatient pending clinical improvement, anticipating skilled nursing facility placement.   Consultants: ortho Procedures: none Microbiology:  Antimicrobials: Anti-infectives (From admission, onward)   None       Objective: Vitals:   07/14/19 0540 07/14/19 1422 07/14/19 2051 07/15/19 0458  BP: 123/67 (!) 122/55 (!) 128/59 131/74  Pulse: 75 79 79 78  Resp: 18  18 18   Temp: 99.6 F (37.6 C) 98 F (36.7 C) 98.5 F (36.9 C) 97.6 F (36.4 C)   TempSrc: Oral Oral Oral Oral  SpO2: 97% 94% 97% 97%  Weight:      Height:        Intake/Output Summary (Last 24 hours) at 07/15/2019 0743 Last data filed at 07/15/2019 0503 Gross per 24 hour  Intake 840 ml  Output 200 ml  Net 640 ml   Filed Weights   07/12/19 1910 07/13/19 0415  Weight: 81.6 kg 86 kg   Weight change:   Body mass index is 34.68 kg/m.  Intake/Output from previous day: 12/01 0701 - 12/02 0700 In: 840 [P.O.:840] Out: 200 [Urine:200] Intake/Output this shift: No intake/output data recorded.  Examination:  General exam: AAO,NAD, Weak appearing. HEENT:Oral mucosa moist, Ear/Nose WNL grossly, dentition normal. Respiratory system: Diminished at the base,no wheezing or crackles,no use of accessory muscle Cardiovascular system: S1 & S2 +, No JVD,. Gastrointestinal system: Abdomen soft, NT,ND, BS+ Nervous System:Alert, awake, moving extremities and grossly nonfocal Extremities: Right foot with a splint in place, no edema, distal peripheral pulses palpable.  Skin: No rashes,no icterus. MSK: Normal muscle bulk,tone, power  Medications:  Scheduled Meds: . amphetamine-dextroamphetamine  10 mg Oral q morning - 10a  . buPROPion  150 mg Oral Daily  . cycloSPORINE  1 drop Both Eyes BID  . heparin  5,000 Units Subcutaneous Q8H  . insulin aspart  0-9 Units Subcutaneous TID WC  . levothyroxine  25 mcg Oral QAC breakfast  . pantoprazole  40 mg Oral Daily  . pravastatin  10 mg Oral q1800  . sertraline  150 mg Oral Daily  . tiZANidine  4 mg Oral QHS   Continuous Infusions:  Data Reviewed: I have personally reviewed  following labs and imaging studies  CBC: Recent Labs  Lab 07/12/19 2126 07/13/19 0232 07/13/19 0537  WBC 10.7* 8.4 8.3  NEUTROABS 7.5  --   --   HGB 11.3* 11.0* 10.3*  HCT 36.5 35.4* 32.8*  MCV 89.0 88.9 91.1  PLT 256 241 737   Basic Metabolic Panel: Recent Labs  Lab 07/12/19 2126 07/13/19 0232 07/14/19 0412  NA 140  --  141  K 3.4*  --   4.1  CL 105  --  108  CO2 25  --  26  GLUCOSE 115*  --  144*  BUN 11  --  13  CREATININE 1.01* 0.92 0.93  CALCIUM 8.7*  --  8.3*  MG  --   --  2.1   GFR: Estimated Creatinine Clearance: 58.1 mL/min (by C-G formula based on SCr of 0.93 mg/dL). Liver Function Tests: Recent Labs  Lab 07/12/19 2126  AST 24  ALT 16  ALKPHOS 75  BILITOT 1.3*  PROT 7.0  ALBUMIN 3.9   No results for input(s): LIPASE, AMYLASE in the last 168 hours. No results for input(s): AMMONIA in the last 168 hours. Coagulation Profile: No results for input(s): INR, PROTIME in the last 168 hours. Cardiac Enzymes: No results for input(s): CKTOTAL, CKMB, CKMBINDEX, TROPONINI in the last 168 hours. BNP (last 3 results) No results for input(s): PROBNP in the last 8760 hours. HbA1C: No results for input(s): HGBA1C in the last 72 hours. CBG: Recent Labs  Lab 07/13/19 2123 07/14/19 0727 07/14/19 1202 07/14/19 1640 07/14/19 2049  GLUCAP 126* 120* 114* 117* 102*   Lipid Profile: No results for input(s): CHOL, HDL, LDLCALC, TRIG, CHOLHDL, LDLDIRECT in the last 72 hours. Thyroid Function Tests: No results for input(s): TSH, T4TOTAL, FREET4, T3FREE, THYROIDAB in the last 72 hours. Anemia Panel: No results for input(s): VITAMINB12, FOLATE, FERRITIN, TIBC, IRON, RETICCTPCT in the last 72 hours. Sepsis Labs: No results for input(s): PROCALCITON, LATICACIDVEN in the last 168 hours.  Recent Results (from the past 240 hour(s))  SARS CORONAVIRUS 2 (TAT 6-24 HRS) Nasopharyngeal Nasopharyngeal Swab     Status: None   Collection Time: 07/13/19 12:48 AM   Specimen: Nasopharyngeal Swab  Result Value Ref Range Status   SARS Coronavirus 2 NEGATIVE NEGATIVE Final    Comment: (NOTE) SARS-CoV-2 target nucleic acids are NOT DETECTED. The SARS-CoV-2 RNA is generally detectable in upper and lower respiratory specimens during the acute phase of infection. Negative results do not preclude SARS-CoV-2 infection, do not rule out  co-infections with other pathogens, and should not be used as the sole basis for treatment or other patient management decisions. Negative results must be combined with clinical observations, patient history, and epidemiological information. The expected result is Negative. Fact Sheet for Patients: SugarRoll.be Fact Sheet for Healthcare Providers: https://www.woods-mathews.com/ This test is not yet approved or cleared by the [place] FDA and  has been authorized for detection and/or diagnosis of SARS-CoV-2 by FDA under an Emergency Use Authorization (EUA). This EUA will remain  in effect (meaning this test can be used) for the duration of the COVID-19 declaration under Section 56 4(b)(1) of the Act, 21 U.S.C. section 360bbb-3(b)(1), unless the authorization is terminated or revoked sooner. Performed at [facility], [place] [address], [place], [place] [address]       Radiology Studies: No results found.    LOS: 1 day   Time spent: More than 50% of that time was spent in counseling and/or coordination of care.  [place]  Lupita Leash, MD [facility]  07/15/2019, 7:43 AM

## 2019-07-15 NOTE — Progress Notes (Signed)
Physical Therapy Treatment Patient Details Name: Jenny Cardenas MRN: [id number] DOB: 09/03/1950 Today's Date: 07/15/2019    History of Present Illness 68 y.o. female with history of diabetes mellitus type 2, hypothyroidism, hyperlipidemia was brought to the ER for the second time in last 48 hours after patient had another fall.  Patient states the first fall was after patient had tripped on the deck hit her head and a small laceration occipital area and also hit her right foot which led to fracture of the calcaneum    PT Comments    Pt assisted OOB and over to recliner.  Pt reports feeling much better today.  Plan is for d/c to SNF.  Pt is motivated and progressing well.   Follow Up Recommendations  SNF;Supervision/Assistance - 24 hour     Equipment Recommendations  Wheelchair cushion (measurements PT);Wheelchair (measurements PT)(with elevating leg rests if home)    Recommendations for Other Services       Precautions / Restrictions Precautions Precautions: Fall Restrictions Weight Bearing Restrictions: Yes RLE Weight Bearing: Non weight bearing    Mobility  Bed Mobility Overal bed mobility: Needs Assistance Bed Mobility: Supine to Sit     Supine to sit: Min guard        Transfers Overall transfer level: Needs assistance Equipment used: Rolling walker (2 wheeled) Transfers: Sit to/from [facility] Sit to Stand: Min assist Stand pivot transfers: Min guard       General transfer comment: verbal cues for hand placement, cues for maintaining R LE NWB, pt requires slight assist to rise  Ambulation/Gait Ambulation/Gait assistance: Min guard Gait Distance (Feet): 4 Feet Assistive device: Rolling walker (2 wheeled)       General Gait Details: pt able to maintain NWB with RW for a few feet over to recliner; pt declined further distance due to fatigue   Stairs             Wheelchair Mobility    Modified Rankin (Stroke Patients Only)        Balance                                            Cognition Arousal/Alertness: Awake/alert Behavior During Therapy: WFL for tasks assessed/performed Overall Cognitive Status: Within Functional Limits for tasks assessed                                        Exercises      General Comments        Pertinent Vitals/Pain Pain Assessment: 0-10 Pain Score: 3  Pain Location: R foot Pain Descriptors / Indicators: Aching;Sore Pain Intervention(s): Repositioned;Monitored during session(elevated)    Home Living                      Prior Function            PT Goals (current goals can now be found in the care plan section) Progress towards PT goals: Progressing toward goals    Frequency    Min 3X/week      PT Plan Current plan remains appropriate    Co-evaluation              AM-PAC PT "6 Clicks" Mobility   Outcome Measure  Help needed turning from your  back to your side while in a flat bed without using bedrails?: A Little Help needed moving from lying on your back to sitting on the side of a flat bed without using bedrails?: A Little Help needed moving to and from a bed to a chair (including a wheelchair)?: A Little Help needed standing up from a chair using your arms (e.g., wheelchair or bedside chair)?: A Little Help needed to walk in hospital room?: A Lot Help needed climbing 3-5 steps with a railing? : Total 6 Click Score: 15    End of Session Equipment Utilized During Treatment: Gait belt Activity Tolerance: Patient tolerated treatment well Patient left: in chair;with call bell/phone within reach;with chair alarm set Nurse Communication: Mobility status PT Visit Diagnosis: Other abnormalities of gait and mobility (R26.89)     Time: 7471-5953 PT Time Calculation (min) (ACUTE ONLY): 9 min  Charges:  $Therapeutic Activity: 8-22 mins                    Carmelia Bake, PT, DPT Acute Rehabilitation  Services Office: [phone] Pager: [place] E 07/15/2019, 12:07 PM

## 2019-07-16 LAB — GLUCOSE, CAPILLARY
Glucose-Capillary: 129 mg/dL — ABNORMAL HIGH (ref 70–99)
Glucose-Capillary: 83 mg/dL (ref 70–99)
Glucose-Capillary: 116 mg/dL — ABNORMAL HIGH (ref 70–99)
Glucose-Capillary: 117 mg/dL — ABNORMAL HIGH (ref 70–99)

## 2019-07-16 NOTE — TOC Progression Note (Signed)
Transition of Care [facility]) - Progression Note    Patient Details  Name: Jenny Cardenas MRN: [id number] Date of Birth: November 24, 1950  [facility] CM/SW Contact  Purcell Mouton, RN Phone Number: 07/16/2019, 4:02 PM  Clinical Narrative:    Pt was approved for [facility] and may go in AM. Insurance Authorization  [id number].   Expected Discharge Plan: [facility](pt is not sure about home or SNF) Barriers to Discharge: No Barriers Identified  Expected Discharge Plan and Services Expected Discharge Plan: [facility](pt is not sure about home or SNF)   Discharge Planning Services: CM Consult   Living arrangements for the past 2 months: Single Family Home                                       Social Determinants of Health (SDOH) Interventions    Readmission Risk Interventions No flowsheet data found.

## 2019-07-16 NOTE — TOC Progression Note (Signed)
Transition of Care [facility]) - Progression Note    Patient Details  Name: Jenny Cardenas MRN: [id number] Date of Birth: 1951/07/28  Transition of Care [facility]) CM/SW Contact  Purcell Mouton, RN Phone Number: 07/16/2019, 9:39 AM  Clinical Narrative:    Spoke with pt concerning [facility]. Admission coordinator called to say that there are 2 COVID cases in the building and asked that pt be made aware. Also that these pt's are isolated from the area where pt will be. All SNF bed offers were given to pt. Pt was made aware of [facility] and other facilities with COVID.  Pt agreed to continue to go to [facility]. Insurance auth called to [employer]. PASRR [id number] E.  Expected Discharge Plan: [place](pt is not sure about home or SNF) Barriers to Discharge: No Barriers Identified  Expected Discharge Plan and Services Expected Discharge Plan: [facility] [place] [facility](pt is not sure about home or SNF)   Discharge Planning Services: CM Consult   Living arrangements for the past 2 months: Single Family Home                                       Social Determinants of Health (SDOH) Interventions    Readmission Risk Interventions No flowsheet data found.

## 2019-07-16 NOTE — Progress Notes (Signed)
PROGRESS NOTE    Jenny Cardenas  UJW:[id number] DOB: Jun 05, 1951 DOA: 07/12/2019 PCP: Maurice Small, MD   Brief Narrative: As per HPI 68 year old female with T2DM, hypothyroidism, hyperlipidemia brought to the ER second time in 48 hours with another fall.  Patient had sustained perianal fracture the first fall.  Ortho was consulted and planning for outpatient evaluation by Dr. Lucia Gaskins. Seen by PT OT, at this time waiting for skilled nursing facility placement Patient is doing stable.  She is awaiting on skilled nursing facility placement.  Subjective:  C/o pain on foot slightly more today, moving her toes on rt, pinkish appearing Last blood work-up 12/1, 11/20  with hemoglobin 10.3, creatinine 0.9.  Assessment & Plan:  Multiple falls at home with rt calcaneal fracture- cont splint. Cont PT OT eval and orthopedic follow-up.  Needs placement to SNF for rehab, given recurrent falls, unsafe discharge to home  Closed right calcaneal fracture: Outpatient follow-up with orthopedic cont splint as above.  Essential hypertension-Brief episode of hypotension likely from medication.  Patient was given fluid bolus previously.  Blood pressure well controlled.  Diabetes mellitus type 2 in obese ,hemoglobi to 6.5 controlled.  Continue SSI.  Normochromic anemia appears to be chronic.  Stable.  Depression on Wellbutrin, Zoloft and Adderall.  Hypothyroidism On Synthroid  HLD on statin  Body mass index is 34.68 kg/m.    DVT prophylaxis: SCD/Heparin Code Status: full Family Communication: plan of care discussed with patient at bedside. Disposition Plan: Remains inpatient pending SNF placement- anticipating skilled nursing facility placement.  Unsafe discharge to home.   Consultants: ortho Procedures: none Microbiology: none  Antimicrobials: Anti-infectives (From admission, onward)   None       Objective: Vitals:   07/15/19 0458 07/15/19 1455 07/15/19 2027 07/16/19 0551  BP: 131/74  128/61 131/67 119/62  Pulse: 78 85 81 75  Resp: 18 18 17 18   Temp: 97.6 F (36.4 C) 98.4 F (36.9 C) 98.8 F (37.1 C) 98.6 F (37 C)  TempSrc: Oral Oral Oral Oral  SpO2: 97% 98% 96% 96%  Weight:      Height:        Intake/Output Summary (Last 24 hours) at 07/16/2019 0834 Last data filed at 07/16/2019 0557 Gross per 24 hour  Intake 240 ml  Output 300 ml  Net -60 ml   Filed Weights   07/12/19 1910 07/13/19 0415  Weight: 81.6 kg 86 kg   Weight change:   Body mass index is 34.68 kg/m.  Intake/Output from previous day: 12/02 0701 - 12/03 0700 In: 240 [P.O.:240] Out: 300 [Urine:300] Intake/Output this shift: No intake/output data recorded.  Examination:  General exam: Alert awake oriented x3, not in acute distress, on room air.   HEENT:Oral mucosa moist, Ear/Nose WNL grossly, dentition normal. Respiratory system: Bilaterally clear with no wheezing or crackles, no use of accessory muscle Cardiovascular system: S1 & S2 +, No JVD,. Gastrointestinal system: Abdomen soft, NT,ND, BS+ Nervous System:Alert, awake, moving extremities and grossly nonfocal Extremities: rt foot w splint- toes pinkish and sensation intact, able to wiggle them  Skin: No rashes,no icterus. MSK: Normal muscle bulk,tone, power  Medications:  Scheduled Meds: . amphetamine-dextroamphetamine  10 mg Oral q morning - 10a  . buPROPion  150 mg Oral Daily  . cycloSPORINE  1 drop Both Eyes BID  . heparin  5,000 Units Subcutaneous Q8H  . insulin aspart  0-9 Units Subcutaneous TID WC  . levothyroxine  25 mcg Oral QAC breakfast  . pantoprazole  40 mg  Oral Daily  . pravastatin  10 mg Oral q1800  . sertraline  150 mg Oral Daily  . tiZANidine  4 mg Oral QHS   Continuous Infusions:  Data Reviewed: I have personally reviewed following labs and imaging studies  CBC: Recent Labs  Lab 07/12/19 2126 07/13/19 0232 07/13/19 0537  WBC 10.7* 8.4 8.3  NEUTROABS 7.5  --   --   HGB 11.3* 11.0* 10.3*  HCT 36.5  35.4* 32.8*  MCV 89.0 88.9 91.1  PLT 256 241 811   Basic Metabolic Panel: Recent Labs  Lab 07/12/19 2126 07/13/19 0232 07/14/19 0412  NA 140  --  141  K 3.4*  --  4.1  CL 105  --  108  CO2 25  --  26  GLUCOSE 115*  --  144*  BUN 11  --  13  CREATININE 1.01* 0.92 0.93  CALCIUM 8.7*  --  8.3*  MG  --   --  2.1   GFR: Estimated Creatinine Clearance: 58.1 mL/min (by C-G formula based on SCr of 0.93 mg/dL). Liver Function Tests: Recent Labs  Lab 07/12/19 2126  AST 24  ALT 16  ALKPHOS 75  BILITOT 1.3*  PROT 7.0  ALBUMIN 3.9   No results for input(s): LIPASE, AMYLASE in the last 168 hours. No results for input(s): AMMONIA in the last 168 hours. Coagulation Profile: No results for input(s): INR, PROTIME in the last 168 hours. Cardiac Enzymes: No results for input(s): CKTOTAL, CKMB, CKMBINDEX, TROPONINI in the last 168 hours. BNP (last 3 results) No results for input(s): PROBNP in the last 8760 hours. HbA1C: No results for input(s): HGBA1C in the last 72 hours. CBG: Recent Labs  Lab 07/15/19 0828 07/15/19 1149 07/15/19 1646 07/15/19 2021 07/16/19 0750  GLUCAP 109* 111* 87 143* 129*   Lipid Profile: No results for input(s): CHOL, HDL, LDLCALC, TRIG, CHOLHDL, LDLDIRECT in the last 72 hours. Thyroid Function Tests: No results for input(s): TSH, T4TOTAL, FREET4, T3FREE, THYROIDAB in the last 72 hours. Anemia Panel: No results for input(s): VITAMINB12, FOLATE, FERRITIN, TIBC, IRON, RETICCTPCT in the last 72 hours. Sepsis Labs: No results for input(s): PROCALCITON, LATICACIDVEN in the last 168 hours.  Recent Results (from the past 240 hour(s))  SARS CORONAVIRUS 2 (TAT 6-24 HRS) Nasopharyngeal Nasopharyngeal Swab     Status: None   Collection Time: 07/13/19 12:48 AM   Specimen: Nasopharyngeal Swab  Result Value Ref Range Status   SARS Coronavirus 2 NEGATIVE NEGATIVE Final    Comment: (NOTE) SARS-CoV-2 target nucleic acids are NOT DETECTED. The SARS-CoV-2 RNA is  generally detectable in upper and lower respiratory specimens during the acute phase of infection. Negative results do not preclude SARS-CoV-2 infection, do not rule out co-infections with other pathogens, and should not be used as the sole basis for treatment or other patient management decisions. Negative results must be combined with clinical observations, patient history, and epidemiological information. The expected result is Negative. Fact Sheet for Patients: SugarRoll.be Fact Sheet for Healthcare Providers: https://www.woods-mathews.com/ This test is not yet approved or cleared by the [place] FDA and  has been authorized for detection and/or diagnosis of SARS-CoV-2 by FDA under an Emergency Use Authorization (EUA). This EUA will remain  in effect (meaning this test can be used) for the duration of the COVID-19 declaration under Section 56 4(b)(1) of the Act, 21 U.S.C. section 360bbb-3(b)(1), unless the authorization is terminated or revoked sooner. Performed at [facility], [place] [address], [place], [place] [address]  Radiology Studies: No results found.    LOS: 2 days   Time spent: More than 50% of that time was spent in counseling and/or coordination of care.  Antonieta Pert, MD [facility]  07/16/2019, 8:34 AM

## 2019-07-17 LAB — SARS CORONAVIRUS 2 (TAT 6-24 HRS): SARS Coronavirus 2: NEGATIVE

## 2019-07-17 LAB — GLUCOSE, CAPILLARY
Glucose-Capillary: 107 mg/dL — ABNORMAL HIGH (ref 70–99)
Glucose-Capillary: 108 mg/dL — ABNORMAL HIGH (ref 70–99)
Glucose-Capillary: 114 mg/dL — ABNORMAL HIGH (ref 70–99)
Glucose-Capillary: 111 mg/dL — ABNORMAL HIGH (ref 70–99)

## 2019-07-17 NOTE — TOC Progression Note (Signed)
Transition of Care [facility]) - Progression Note    Patient Details  Name: Jenny Cardenas MRN: [id number] Date of Birth: 06-Mar-1951  Transition of Care [facility]) CM/SW [place], LCSW Phone Number: 07/17/2019, 11:43 AM  Clinical Narrative:   CSW following patient for discharge needs. Patient has bed at [facility] but patient will be unable to discharge without an up to date covid test. CSW requested a covid test from MD. CSW will continue to follow for discharge needs. Patient can discharge to facility once negative covid test has been obtained      Expected Discharge Plan: [place] [facility](pt is not sure about home or SNF) Barriers to Discharge: No Barriers Identified  Expected Discharge Plan and Services Expected Discharge Plan: [facility](pt is not sure about home or SNF)   Discharge Planning Services: CM Consult   Living arrangements for the past 2 months: Single Family Home Expected Discharge Date: 07/17/19                                     Social Determinants of Health (SDOH) Interventions    Readmission Risk Interventions No flowsheet data found.

## 2019-07-17 NOTE — Progress Notes (Signed)
Physical Therapy Treatment Patient Details Name: Jenny Cardenas MRN: [id number] DOB: [birth year] Today's Date: 07/17/2019    History of Present Illness 68 y.o. female with history of diabetes mellitus type 2, hypothyroidism, hyperlipidemia was brought to the ER for the second time in last 48 hours after patient had another fall.  Patient states the first fall was after patient had tripped on the deck hit her head and a small laceration occipital area and also hit her right foot which led to fracture of the calcaneum    PT Comments    Pt in bed with daughter at bedside.  Assisted OOB to amb to and from bathroom at [facility] using a walker.  Advised pt to wear a left shoe to help off set bulky cast.  Trial, had pt use a knee scooter as daughter was inquiring.  Pt required + 2 side by side assist to scoot in hallway 45 feet with instructions on proper use, brakes and safety.  Pt prefers the walker for mobility.  Pt plans to D/C to SNF for ST Rehab.    Follow Up Recommendations  SNF     Equipment Recommendations       Recommendations for Other Services       Precautions / Restrictions Precautions Precautions: Fall Restrictions Weight Bearing Restrictions: Yes RLE Weight Bearing: Non weight bearing Other Position/Activity Restrictions: educated on importance elevation R LE    Mobility  Bed Mobility Overal bed mobility: Needs Assistance Bed Mobility: Supine to Sit;Sit to Supine     Supine to sit: Supervision;Min guard Sit to supine: Supervision;Min guard   General bed mobility comments: assist for R LE support and increased time  Transfers Overall transfer level: Needs assistance Equipment used: Rolling walker (2 wheeled) Transfers: Sit to/from [facility] Sit to Stand: Min guard;Min assist Stand pivot transfers: Min guard;Min assist       General transfer comment: verbal cues for hand placement, cues for maintaining R LE NWB, pt requires slight assist to rise.   Practiced toilet transfer with VC's on safety with turns  Ambulation/Gait Ambulation/Gait assistance: Min guard Gait Distance (Feet): 6 Feet Assistive device: Rolling walker (2 wheeled) Gait Pattern/deviations: Step-to pattern Gait velocity: decreased   General Gait Details: pt able to maintain NWB with RW to and from bathroom; Used a knee scooter in hallway + 2 side by side assist fior safety.  Pt educated on proper use, brakes and safety. After trail use, pt preferred the walker for mobility.   Stairs             Wheelchair Mobility    Modified Rankin (Stroke Patients Only)       Balance                                            Cognition Arousal/Alertness: Awake/alert Behavior During Therapy: WFL for tasks assessed/performed Overall Cognitive Status: Within Functional Limits for tasks assessed                                        Exercises      General Comments        Pertinent Vitals/Pain Pain Assessment: 0-10 Pain Score: 5  Pain Location: R foot (heel) Pain Descriptors / Indicators: Aching;Sore Pain Intervention(s): Monitored during session;Premedicated before  session;Repositioned;Ice applied    Home Living                      Prior Function            PT Goals (current goals can now be found in the care plan section) Progress towards PT goals: Progressing toward goals    Frequency    Min 3X/week      PT Plan Current plan remains appropriate    Co-evaluation              AM-PAC PT "6 Clicks" Mobility   Outcome Measure  Help needed turning from your back to your side while in a flat bed without using bedrails?: A Little Help needed moving from lying on your back to sitting on the side of a flat bed without using bedrails?: A Little Help needed moving to and from a bed to a chair (including a wheelchair)?: A Little Help needed standing up from a chair using your arms (e.g.,  wheelchair or bedside chair)?: A Little Help needed to walk in hospital room?: A Lot Help needed climbing 3-5 steps with a railing? : Total 6 Click Score: 15    End of Session Equipment Utilized During Treatment: Gait belt Activity Tolerance: Patient tolerated treatment well Patient left: in bed;with call bell/phone within reach;with bed alarm set Nurse Communication: Mobility status PT Visit Diagnosis: Other abnormalities of gait and mobility (R26.89)     Time: 8295-6213 PT Time Calculation (min) (ACUTE ONLY): 29 min  Charges:  $Gait Training: 8-22 mins $Therapeutic Activity: 8-22 mins                     {Rubina Basinski  PTA Acute  Rehabilitation Services Pager      [phone] Office      [phone]

## 2019-07-17 NOTE — Plan of Care (Signed)
?  Problem: Clinical Measurements: ?Goal: Ability to maintain clinical measurements within normal limits will improve ?Outcome: Progressing ?Goal: Will remain free from infection ?Outcome: Progressing ?Goal: Diagnostic test results will improve ?Outcome: Progressing ?  ?

## 2019-07-17 NOTE — Progress Notes (Signed)
Pt states Ankle splint feels tight and had mild discomfort. Ortho tech called to check. SRP RN

## 2019-07-17 NOTE — Progress Notes (Addendum)
Informed by the social worker that patient has a skilled nursing facility available today. I discussed with Dr Lucia Gaskins from orthopedic surgery who advised nonweightbearing on the right foot continue splint and outpatient follow-up next week. Discharge summary has been updated, she will continue home chronic medication no new changes. issues Multiple falls at home with rt calcaneal fracture Closed right calcaneal fracture:  Essential hypertension Diabetes mellitus type 2 in obese ,hemoglobi a1c to 6.5 controlled. Normochromic anemia  Depression Hypothyroidism  HLD  Patient was little nauseous this morning in rounds-I reevaluated she is feeling fine and agreeable for discharge to skilled nursing facility today. Pt will go to SNF once covid is back as previous one is now d day old as per CM.

## 2019-07-17 NOTE — Care Management Important Message (Signed)
Important Message  Patient Details IM Letter given to [name] SW to present to the Patient   Name: Jenny Cardenas MRN: [id number] Date of Birth: 22-Dec-1950   Medicare Important Message Given:  Yes     Kerin Salen 07/17/2019, 12:39 PM

## 2019-07-18 LAB — GLUCOSE, CAPILLARY: Glucose-Capillary: 92 mg/dL (ref 70–99)

## 2019-07-18 NOTE — TOC Transition Note (Signed)
Transition of Care [facility]) - CM/SW Discharge Note   Patient Details  Name: Jenny Cardenas MRN: [id number] Date of Birth: [birth year]  Transition of Care [facility]) CM/SW Contact:  Wende Neighbors, LCSW Phone Number: 07/18/2019, 10:00 AM   Clinical Narrative:     Patient to discharge to [facility] via PT[facility]. CSW spoke to daughter Earnest Bailey and she stated she is agreeable to discharge plan. Earnest Bailey stated she wanted to come to the hospital and visit patient prior to 11am PTAR pick up. RN to call [phone] and ask for Marin Olp for report   Final next level of care: Skilled Nursing Facility Barriers to Discharge: No Barriers Identified   Patient Goals and CMS Choice Patient states their goals for this hospitalization and ongoing recovery are:: to get better and return home   Choice offered to / list presented to : Patient  Discharge Placement              Patient chooses bed at: [facility] [facility], [place] Patient to be transferred to facility by: ptar Name of family member notified: spoke to [facility] Patient and family notified of of transfer: 07/18/19  Discharge Plan and Services   Discharge Planning Services: CM Consult                                 Social Determinants of Health (SDOH) Interventions     Readmission Risk Interventions No flowsheet data found.

## 2019-07-18 NOTE — Progress Notes (Signed)
PROGRESS NOTE    Jenny Cardenas  BPZ:[id number] DOB: February 14, 1951 DOA: 07/12/2019 PCP: Maurice Small, MD   Brief Narrative: As per HPI 68 year old female with T2DM, hypothyroidism, hyperlipidemia brought to the ER second time in 48 hours with another fall.  Patient had sustained perianal fracture the first fall.  Ortho was consulted and planning for outpatient evaluation by Dr. Lucia Gaskins. Seen by PT OT, at this time waiting for skilled nursing facility placement Patient is doing stable.  She is awaiting on skilled nursing facility placement.  Subjective: Patient feels much improved today pain is controlled.  Orthotec has changed her right foot splint. Repeat COVID-19 test came back negative. Assessment & Plan:  Multiple falls at home with rt calcaneal fracture- cont splint. Cont PT OT eval and orthopedic follow-up.  Needs placement to SNF for rehab, given recurrent falls, unsafe discharge to home.  She will go to skilled nursing facility.  Closed right calcaneal fracture: Outpatient follow-up with orthopedic cont splint as above.  Already spoken with orthopedic surgery who is going to follow-up as outpatient continue nonweightbearing for now.  Essential hypertension-Brief episode of hypotension likely from medication.  Patient was given fluid bolus previously.  Blood pressure well controlled.  Diabetes mellitus type 2 in obese ,hemoglobi to 6.5 controlled.  Continue SSI.  Normochromic anemia appears to be chronic.  Stable.  Depression on Wellbutrin, Zoloft and Adderall.  Hypothyroidism On Synthroid  HLD on statin  Body mass index is 34.68 kg/m.    DVT prophylaxis: SCD/Heparin Code Status: full Family Communication: plan of care discussed with patient at bedside. Disposition Plan: Skilled nursing facility today.  COVID-19  Neg 12/4  to home.   Consultants: ortho Procedures: none Microbiology: none  Antimicrobials: Anti-infectives (From admission, onward)   None        Objective: Vitals:   07/17/19 0431 07/17/19 0755 07/17/19 2025 07/18/19 0452  BP: 128/67 129/68 140/67 (!) 119/55  Pulse: 68 72 78 73  Resp: 18  16 18   Temp: 99 F (37.2 C) 98.1 F (36.7 C) 98.9 F (37.2 C) 98.4 F (36.9 C)  TempSrc: Oral Oral Oral Oral  SpO2: 97% 95% 94% 94%  Weight:      Height:       No intake or output data in the 24 hours ending 07/18/19 1125 Filed Weights   07/12/19 1910 07/13/19 0415  Weight: 81.6 kg 86 kg   Weight change:   Body mass index is 34.68 kg/m.  Intake/Output from previous day: No intake/output data recorded. Intake/Output this shift: No intake/output data recorded.  Examination:  General exam:  AAOX3, NAD, ON RA HEENT:Oral mucosa moist, Ear/Nose WNL grossly, dentition normal. Respiratory system: Bilaterally clear with no wheezing or crackles, no use of accessory muscle Cardiovascular system: S1 & S2 +, No JVD,. Gastrointestinal system: Abdomen soft, NT,ND, BS+ Nervous System:Alert, awake, moving extremities and grossly nonfocal Extremities: Rt FOOT SPLINT +, distal toes are visible pinkish able to move.   Skin: No rashes,no icterus. MSK: Normal muscle bulk,tone, power  Medications:  Scheduled Meds: . amphetamine-dextroamphetamine  10 mg Oral q morning - 10a  . buPROPion  150 mg Oral Daily  . cycloSPORINE  1 drop Both Eyes BID  . heparin  5,000 Units Subcutaneous Q8H  . insulin aspart  0-9 Units Subcutaneous TID WC  . levothyroxine  25 mcg Oral QAC breakfast  . pantoprazole  40 mg Oral Daily  . pravastatin  10 mg Oral q1800  . sertraline  150 mg Oral Daily  .  tiZANidine  4 mg Oral QHS   Continuous Infusions:  Data Reviewed: I have personally reviewed following labs and imaging studies  CBC: Recent Labs  Lab 07/12/19 2126 07/13/19 0232 07/13/19 0537  WBC 10.7* 8.4 8.3  NEUTROABS 7.5  --   --   HGB 11.3* 11.0* 10.3*  HCT 36.5 35.4* 32.8*  MCV 89.0 88.9 91.1  PLT 256 241 314   Basic Metabolic Panel: Recent Labs   Lab 07/12/19 2126 07/13/19 0232 07/14/19 0412  NA 140  --  141  K 3.4*  --  4.1  CL 105  --  108  CO2 25  --  26  GLUCOSE 115*  --  144*  BUN 11  --  13  CREATININE 1.01* 0.92 0.93  CALCIUM 8.7*  --  8.3*  MG  --   --  2.1   GFR: Estimated Creatinine Clearance: 58.1 mL/min (by C-G formula based on SCr of 0.93 mg/dL). Liver Function Tests: Recent Labs  Lab 07/12/19 2126  AST 24  ALT 16  ALKPHOS 75  BILITOT 1.3*  PROT 7.0  ALBUMIN 3.9   No results for input(s): LIPASE, AMYLASE in the last 168 hours. No results for input(s): AMMONIA in the last 168 hours. Coagulation Profile: No results for input(s): INR, PROTIME in the last 168 hours. Cardiac Enzymes: No results for input(s): CKTOTAL, CKMB, CKMBINDEX, TROPONINI in the last 168 hours. BNP (last 3 results) No results for input(s): PROBNP in the last 8760 hours. HbA1C: No results for input(s): HGBA1C in the last 72 hours. CBG: Recent Labs  Lab 07/17/19 0748 07/17/19 1251 07/17/19 1729 07/17/19 2025 07/18/19 0748  GLUCAP 107* 108* 114* 111* 92   Lipid Profile: No results for input(s): CHOL, HDL, LDLCALC, TRIG, CHOLHDL, LDLDIRECT in the last 72 hours. Thyroid Function Tests: No results for input(s): TSH, T4TOTAL, FREET4, T3FREE, THYROIDAB in the last 72 hours. Anemia Panel: No results for input(s): VITAMINB12, FOLATE, FERRITIN, TIBC, IRON, RETICCTPCT in the last 72 hours. Sepsis Labs: No results for input(s): PROCALCITON, LATICACIDVEN in the last 168 hours.  Recent Results (from the past 240 hour(s))  SARS CORONAVIRUS 2 (TAT 6-24 HRS) Nasopharyngeal Nasopharyngeal Swab     Status: None   Collection Time: 07/13/19 12:48 AM   Specimen: Nasopharyngeal Swab  Result Value Ref Range Status   SARS Coronavirus 2 NEGATIVE NEGATIVE Final    Comment: (NOTE) SARS-CoV-2 target nucleic acids are NOT DETECTED. The SARS-CoV-2 RNA is generally detectable in upper and lower respiratory specimens during the acute phase of  infection. Negative results do not preclude SARS-CoV-2 infection, do not rule out co-infections with other pathogens, and should not be used as the sole basis for treatment or other patient management decisions. Negative results must be combined with clinical observations, patient history, and epidemiological information. The expected result is Negative. Fact Sheet for Patients: SugarRoll.be Fact Sheet for Healthcare Providers: https://www.woods-mathews.com/ This test is not yet approved or cleared by the [place] FDA and  has been authorized for detection and/or diagnosis of SARS-CoV-2 by FDA under an Emergency Use Authorization (EUA). This EUA will remain  in effect (meaning this test can be used) for the duration of the COVID-19 declaration under Section 56 4(b)(1) of the Act, 21 U.S.C. section 360bbb-3(b)(1), unless the authorization is terminated or revoked sooner. Performed at [facility], [place] [address] [place], [place] [address]   SARS CORONAVIRUS 2 (TAT 6-24 HRS) Nasopharyngeal Nasopharyngeal Swab     Status: None   Collection  Time: 07/17/19  1:06 PM   Specimen: Nasopharyngeal Swab  Result Value Ref Range Status   SARS Coronavirus 2 NEGATIVE NEGATIVE Final    Comment: (NOTE) SARS-CoV-2 target nucleic acids are NOT DETECTED. The SARS-CoV-2 RNA is generally detectable in upper and lower respiratory specimens during the acute phase of infection. Negative results do not preclude SARS-CoV-2 infection, do not rule out co-infections with other pathogens, and should not be used as the sole basis for treatment or other patient management decisions. Negative results must be combined with clinical observations, patient history, and epidemiological information. The expected result is Negative. Fact Sheet for Patients: SugarRoll.be Fact Sheet for Healthcare Providers:  https://www.woods-mathews.com/ This test is not yet approved or cleared by the [place] FDA and  has been authorized for detection and/or diagnosis of SARS-CoV-2 by FDA under an Emergency Use Authorization (EUA). This EUA will remain  in effect (meaning this test can be used) for the duration of the COVID-19 declaration under Section 56 4(b)(1) of the Act, 21 U.S.C. section 360bbb-3(b)(1), unless the authorization is terminated or revoked sooner. Performed at [facility], [place] [address], [place], [place] [address]       Radiology Studies: No results found.    LOS: 4 days   Time spent: More than 50% of that time was spent in counseling and/or coordination of care.  Antonieta Pert, MD [facility]  07/18/2019, 11:25 AM

## 2019-07-18 NOTE — Plan of Care (Signed)
?  Problem: Clinical Measurements: ?Goal: Ability to maintain clinical measurements within normal limits will improve ?Outcome: Progressing ?Goal: Will remain free from infection ?Outcome: Progressing ?Goal: Diagnostic test results will improve ?Outcome: Progressing ?  ?

## 2019-07-18 NOTE — Progress Notes (Addendum)
Pt discharged to [facility], report called to April . Instructions reviewed with pt and daughter. EMS has arrived to pick up patient. Pt alert and oriented. Pt c/o pain, to Percocet given prior to transport for 7/10 pain to right ankle. SRP, RN

## 2019-09-21 DIAGNOSIS — S92001D Unspecified fracture of right calcaneus, subsequent encounter for fracture with routine healing: Secondary | ICD-10-CM | POA: Diagnosis not present

## 2019-10-08 DIAGNOSIS — Z23 Encounter for immunization: Secondary | ICD-10-CM | POA: Diagnosis not present

## 2019-10-15 ENCOUNTER — Other Ambulatory Visit: Payer: Self-pay | Admitting: Family Medicine

## 2019-10-15 DIAGNOSIS — R918 Other nonspecific abnormal finding of lung field: Secondary | ICD-10-CM

## 2019-10-26 ENCOUNTER — Ambulatory Visit
Admission: RE | Admit: 2019-10-26 | Discharge: 2019-10-26 | Disposition: A | Discharge status: Home / Self Care | Payer: Medicare Other | Source: Ambulatory Visit | Attending: Family Medicine | Admitting: Family Medicine

## 2019-10-26 DIAGNOSIS — R918 Other nonspecific abnormal finding of lung field: Secondary | ICD-10-CM | POA: Diagnosis not present

## 2019-10-30 DIAGNOSIS — S92001D Unspecified fracture of right calcaneus, subsequent encounter for fracture with routine healing: Secondary | ICD-10-CM | POA: Diagnosis not present

## 2019-11-02 ENCOUNTER — Other Ambulatory Visit: Payer: Medicare Other

## 2019-11-05 DIAGNOSIS — Z23 Encounter for immunization: Secondary | ICD-10-CM | POA: Diagnosis not present

## 2019-12-17 ENCOUNTER — Other Ambulatory Visit: Payer: Medicare Other

## 2019-12-25 ENCOUNTER — Other Ambulatory Visit: Payer: Self-pay

## 2019-12-25 ENCOUNTER — Ambulatory Visit
Admission: RE | Admit: 2019-12-25 | Discharge: 2019-12-25 | Disposition: A | Discharge status: Home / Self Care | Payer: Medicare HMO | Source: Ambulatory Visit | Attending: Family Medicine | Admitting: Family Medicine

## 2019-12-25 ENCOUNTER — Other Ambulatory Visit: Payer: Self-pay | Admitting: Family Medicine

## 2019-12-25 DIAGNOSIS — N6321 Unspecified lump in the left breast, upper outer quadrant: Secondary | ICD-10-CM | POA: Diagnosis not present

## 2019-12-25 DIAGNOSIS — N6002 Solitary cyst of left breast: Secondary | ICD-10-CM

## 2019-12-25 DIAGNOSIS — R928 Other abnormal and inconclusive findings on diagnostic imaging of breast: Secondary | ICD-10-CM | POA: Diagnosis not present

## 2020-04-12 DIAGNOSIS — H04123 Dry eye syndrome of bilateral lacrimal glands: Secondary | ICD-10-CM | POA: Diagnosis not present

## 2020-04-12 DIAGNOSIS — Z961 Presence of intraocular lens: Secondary | ICD-10-CM | POA: Diagnosis not present

## 2020-04-12 DIAGNOSIS — E119 Type 2 diabetes mellitus without complications: Secondary | ICD-10-CM | POA: Diagnosis not present

## 2020-04-12 DIAGNOSIS — H35033 Hypertensive retinopathy, bilateral: Secondary | ICD-10-CM | POA: Diagnosis not present

## 2020-04-13 DIAGNOSIS — Z0000 Encounter for general adult medical examination without abnormal findings: Secondary | ICD-10-CM | POA: Diagnosis not present

## 2020-04-13 DIAGNOSIS — E785 Hyperlipidemia, unspecified: Secondary | ICD-10-CM | POA: Diagnosis not present

## 2020-04-13 DIAGNOSIS — E538 Deficiency of other specified B group vitamins: Secondary | ICD-10-CM | POA: Diagnosis not present

## 2020-04-13 DIAGNOSIS — Z23 Encounter for immunization: Secondary | ICD-10-CM | POA: Diagnosis not present

## 2020-04-13 DIAGNOSIS — E0590 Thyrotoxicosis, unspecified without thyrotoxic crisis or storm: Secondary | ICD-10-CM | POA: Diagnosis not present

## 2020-04-13 DIAGNOSIS — I10 Essential (primary) hypertension: Secondary | ICD-10-CM | POA: Diagnosis not present

## 2020-04-13 DIAGNOSIS — E119 Type 2 diabetes mellitus without complications: Secondary | ICD-10-CM | POA: Diagnosis not present

## 2020-05-02 ENCOUNTER — Ambulatory Visit
Admission: RE | Admit: 2020-05-02 | Discharge: 2020-05-02 | Disposition: A | Discharge status: Home / Self Care | Payer: Medicare HMO | Source: Ambulatory Visit | Attending: Family Medicine | Admitting: Family Medicine

## 2020-05-02 ENCOUNTER — Other Ambulatory Visit: Payer: Self-pay

## 2020-05-02 ENCOUNTER — Other Ambulatory Visit: Payer: Medicare HMO

## 2020-05-02 DIAGNOSIS — N6002 Solitary cyst of left breast: Secondary | ICD-10-CM | POA: Diagnosis not present

## 2020-05-02 DIAGNOSIS — R928 Other abnormal and inconclusive findings on diagnostic imaging of breast: Secondary | ICD-10-CM | POA: Diagnosis not present

## 2020-05-12 DIAGNOSIS — D225 Melanocytic nevi of trunk: Secondary | ICD-10-CM | POA: Diagnosis not present

## 2020-05-12 DIAGNOSIS — L821 Other seborrheic keratosis: Secondary | ICD-10-CM | POA: Diagnosis not present

## 2020-05-12 DIAGNOSIS — D1801 Hemangioma of skin and subcutaneous tissue: Secondary | ICD-10-CM | POA: Diagnosis not present

## 2020-05-12 DIAGNOSIS — L301 Dyshidrosis [pompholyx]: Secondary | ICD-10-CM | POA: Diagnosis not present

## 2020-06-22 DIAGNOSIS — Z23 Encounter for immunization: Secondary | ICD-10-CM | POA: Diagnosis not present

## 2020-09-17 DIAGNOSIS — H40033 Anatomical narrow angle, bilateral: Secondary | ICD-10-CM | POA: Diagnosis not present

## 2020-09-17 DIAGNOSIS — H04123 Dry eye syndrome of bilateral lacrimal glands: Secondary | ICD-10-CM | POA: Diagnosis not present

## 2020-09-27 ENCOUNTER — Ambulatory Visit (INDEPENDENT_AMBULATORY_CARE_PROVIDER_SITE_OTHER): Payer: Medicare Other | Admitting: Ophthalmology

## 2020-09-27 ENCOUNTER — Other Ambulatory Visit: Payer: Self-pay

## 2020-09-27 ENCOUNTER — Encounter (INDEPENDENT_AMBULATORY_CARE_PROVIDER_SITE_OTHER): Payer: Self-pay | Admitting: Ophthalmology

## 2020-09-27 DIAGNOSIS — E119 Type 2 diabetes mellitus without complications: Secondary | ICD-10-CM

## 2020-09-27 DIAGNOSIS — H353131 Nonexudative age-related macular degeneration, bilateral, early dry stage: Secondary | ICD-10-CM | POA: Diagnosis not present

## 2020-09-27 DIAGNOSIS — Z961 Presence of intraocular lens: Secondary | ICD-10-CM

## 2020-09-27 DIAGNOSIS — H3581 Retinal edema: Secondary | ICD-10-CM

## 2020-09-27 DIAGNOSIS — H04123 Dry eye syndrome of bilateral lacrimal glands: Secondary | ICD-10-CM

## 2020-09-27 DIAGNOSIS — Z9889 Other specified postprocedural states: Secondary | ICD-10-CM

## 2020-09-27 NOTE — Progress Notes (Signed)
[facility] Note  09/27/2020     CHIEF COMPLAINT Patient presents for Retina Evaluation   HISTORY OF PRESENT ILLNESS: Jenny Cardenas is a 70 y.o. female who presents to the clinic today for:   HPI    Retina Evaluation    In both eyes.  Context:  near vision.  I, the attending physician,  performed the HPI with the patient and updated documentation appropriately.          Comments    Retina eval per Dr. Marin Comment for Macular Degeneration OU.  Patient noticed she was unable to read on her phone very well.  After on the phone eyes just wants to close.  Using Restasis BID OU (she is currently out).  At times it feels like "I have rocks in my eyes" She had trauma to the eyes in the 70's.  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   PCIOL OU, Lasik sx OU A1C 5.3 pt does not check her BS  (Pts mother is also a pt of Dr. Coralyn Pear)       Last edited by Bernarda Caffey, MD on 09/27/2020 12:30 PM. (History)    pt is here on the referral of Dr. Marin Comment for concern of ARMD OU, pt states before she changed her glasses, her vision was blurry in the evenings, she was having a hard time reading her phone, pt had cataract sx with Dr. Lucita Ferrara, pt states she was "blinded" by "sterility gas" 40 years ago for about 6 weeks, she states she has scars on her cornea now, pt uses Restasis for dry eyes  Referring physician: Marin Comment [place], [place],  [place] [phone]  HISTORICAL INFORMATION:   Selected notes from the MEDICAL RECORD NUMBER Referred by Dr. Marin Comment for concern of macular degeneration OU LEE:  Ocular Hx- PMH-    CURRENT MEDICATIONS: Current Outpatient Medications (Ophthalmic Drugs)  Medication Sig  . cycloSPORINE (RESTASIS) 0.05 % ophthalmic emulsion Place 1 drop into both eyes 2 (two) times daily.   No current facility-administered medications for this visit.  (Ophthalmic Drugs)   Current Outpatient Medications (Other)  Medication Sig  . albuterol (PROVENTIL) (2.5 MG/3ML) 0.083% nebulizer solution Take 2.5 mg by nebulization every 4 (four) hours as needed for wheezing or shortness of breath.   . ALPRAZolam (XANAX) 0.5 MG tablet Take 1 tablet (0.5 mg total) by mouth 3 (three) times daily as needed for anxiety.  Marland Kitchen amphetamine-dextroamphetamine (ADDERALL) 10 MG tablet Take 1 tablet (10 mg total) by mouth every morning.  . Cyanocobalamin (VITAMIN B 12) 500 MCG TABS Take 500 mg by mouth once a week.  . furosemide (LASIX) 20 MG tablet Take 20 mg by mouth daily.  . Ibuprofen-diphenhydrAMINE Cit 200-38 MG TABS Take 1 tablet by mouth at bedtime as needed (sleep).  Marland Kitchen levothyroxine (SYNTHROID, LEVOTHROID) 25 MCG tablet Take 25 mcg by mouth daily before breakfast.  . metFORMIN (GLUCOPHAGE-XR) 500 MG 24 hr tablet Take 500 mg by mouth every evening.  . pantoprazole (PROTONIX) 40 MG tablet Take 40 mg by mouth daily.  . pravastatin (PRAVACHOL) 10 MG tablet Take 10 mg by mouth daily.  . sertraline (ZOLOFT) 100 MG tablet Take 150 mg by mouth daily. Take 150 mg (1 whole tablet and 1/2 tablet) by mouth once daily.  Marland Kitchen tiZANidine (ZANAFLEX) 4 MG tablet Take 1 tablet (  4 mg total) by mouth at bedtime.  Marland Kitchen buPROPion (WELLBUTRIN XL) 150 MG 24 hr tablet Take 150 mg by mouth daily. (Patient not taking: Reported on 09/27/2020)  . ketoconazole (NIZORAL) 2 % cream Apply 1 application topically daily as needed for irritation.  (Patient not taking: Reported on 09/27/2020)  . mupirocin ointment (BACTROBAN) 2 % Apply 1 application topically daily as needed (for mask dermatitis).  (Patient not taking: Reported on 09/27/2020)  . oxyCODONE-acetaminophen (PERCOCET/ROXICET) 5-325 MG tablet Take 1-2 tablets by mouth every 6 (six) hours as needed for moderate pain or severe pain. (Patient not taking: Reported on 09/27/2020)  . triamcinolone cream (KENALOG) 0.1 % Apply 1 application topically.  (Patient not taking: Reported on 09/27/2020)  . valACYclovir (VALTREX) 500 MG tablet Take 1 tablet (500 mg total) by mouth daily. (Patient not taking: Reported on 09/27/2020)  . Vitamin D, Ergocalciferol, (DRISDOL) 1.25 MG (50000 UT) CAPS capsule 1 CAPSULE ONCE A WEEK X 8 WEEKS (Patient not taking: Reported on 09/27/2020)   No current facility-administered medications for this visit. (Other)      REVIEW OF SYSTEMS: ROS    Positive for: Gastrointestinal, Musculoskeletal, Endocrine, Eyes, Psychiatric   Negative for: Constitutional, Neurological, Skin, Genitourinary, HENT, Cardiovascular, Respiratory, Allergic/Imm, Heme/Lymph   Last edited by Leonie Douglas, COA on 09/27/2020  8:51 AM. (History)       ALLERGIES Allergies  Allergen Reactions  . Other     Etholine Oxide: causes itching and hives, and temporary blindness    PAST MEDICAL HISTORY Past Medical History:  Diagnosis Date  . Anxiety   . Arthritis   . Carpal tunnel syndrome   . Cataract   . Depression   . Diabetes mellitus without complication (HCC)    Type 2  . Fall April 10, 2015  . GERD (gastroesophageal reflux disease)   . Goiter   . History of shingles    On abdomen, left ring finger, and left leg; Pt takes Valtrex  . Hyperlipidemia   . Hypertension   . Obesity   . PTSD (post-traumatic stress disorder)   . Shortness of breath dyspnea    pt stated its related to the back problems she has  . Thyroid disease   . Ulcer    Past Surgical History:  Procedure Laterality Date  . ABDOMINAL HYSTERECTOMY  1996  . CATARACT EXTRACTION, BILATERAL    . [place]  . COLONOSCOPY W/ POLYPECTOMY    . KYPHOPLASTY N/A 06/02/2015   Procedure: KYPHOPLASTY;  Surgeon: Phylliss Bob, MD;  Location: [place] [facility];  Service: Orthopedics;  Laterality: N/A;  Thoracic 3, 8, 10 kyphoplasty    FAMILY HISTORY Family History  Problem Relation Age of Onset  . COPD Mother   . Depression Mother   . Hypertension Mother    . Hyperlipidemia Mother   . Breast cancer Mother 12  . Alzheimer's disease Father   . Heart disease Father   . Breast cancer Sister 62  . Colon cancer Neg Hx     SOCIAL HISTORY Social History   Tobacco Use  . Smoking status: Former Smoker    Packs/day: 0.50    Years: 4.00    Pack years: 2.00    Types: Cigarettes    Quit date: 06/28/2016    Years since quitting: 4.2  . Smokeless tobacco: Never Used  Vaping Use  . Vaping Use: Never used  Substance Use Topics  . Alcohol use: Yes    Comment: rare glass of wine (holidays  only)  . Drug use: No         OPHTHALMIC EXAM:  Base Eye Exam    Visual Acuity (Snellen - Linear)      Right Left   Dist cc 20/30 -2 20/25 -2   Correction: Glasses       Tonometry (Tonopen, 9:14 AM)      Right Left   Pressure 18 19       Pupils      Dark Light Shape React APD   Right 3 2 Round Brisk None   Left 3 2 Round Brisk None       Visual Fields (Counting fingers)      Left Right    Full Full       Extraocular Movement      Right Left    Full Full       Neuro/Psych    Oriented x3: Yes   Mood/Affect: Normal       Dilation    Both eyes: 1.0% Mydriacyl, 2.5% Phenylephrine @ 9:14 AM        Slit Lamp and Fundus Exam    Slit Lamp Exam      Right Left   Lids/Lashes Mild Dermatochalasis - upper lid, Meibomian gland dysfunction Mild Dermatochalasis - upper lid, Meibomian gland dysfunction   Conjunctiva/Sclera prominent epi scleral vessels temporally prominent epi scleral vessels temporally   Cornea Mild arcus, trace Punctate epithelial erosions, well healed cataract wounds, superior / inferior LRIs, barely visible lasik flap Mild arcus, trace Punctate epithelial erosions, well healed cataract wounds, superior / inferior LRIs, well healed lasik flap, mild corneal haze / Salzmann's nodule at 0400   Anterior Chamber Deep and quiet Deep and quiet   Iris Round and dilated Round and dilated   Lens PC IOL in good position with open PC  PC IOL in good position with open PC   Vitreous Vitreous syneresis, Posterior vitreous detachment, mild vitreous condensations Vitreous syneresis, Posterior vitreous detachment, vitreous condensations       Fundus Exam      Right Left   Disc Pink and Sharp mild Pallor, Sharp rim   C/D Ratio 0.2 0.3   Macula Flat, Good foveal reflex, rare Drusen, mild RPE mottling Flat, Good foveal reflex, rare Drusen, mild RPE mottling   Vessels mild attenuation mild attenuation   Periphery Attached, mild reticular degeneration Attached, mild reticular degeneration        Refraction    Wearing Rx      Sphere Cylinder Axis Add   Right +0.50 +0.75 080 +3.50   Left [place] +0.75 085 +3.50       Manifest Refraction      Sphere Cylinder Axis Dist VA   Right +0.50 +0.75 080 NI   Left [place] +0.75 085 NI          IMAGING AND PROCEDURES  Imaging and Procedures for 09/27/2020  OCT, Retina - OU - Both Eyes       Right Eye Quality was good. Central Foveal Thickness: 293. Progression has no prior data. Findings include normal foveal contour, no IRF, no SRF, retinal drusen  (Rare drusen).   Left Eye Quality was good. Central Foveal Thickness: 290. Progression has no prior data. Findings include normal foveal contour, no IRF, no SRF, retinal drusen  (Rare, focal drusen).   Notes *Images captured and stored on drive  Diagnosis / Impression:  NFP, no IRF/SRF OU rare focal drusen / early non-exu ARMD OU  Clinical management:  See below  Abbreviations: NFP - Normal foveal profile. CME - cystoid macular edema. PED - pigment epithelial detachment. IRF - intraretinal fluid. SRF - subretinal fluid. EZ - ellipsoid zone. ERM - epiretinal membrane. ORA - outer retinal atrophy. ORT - outer retinal tubulation. SRHM - subretinal hyper-reflective material. IRHM - intraretinal hyper-reflective material                 ASSESSMENT/PLAN:    ICD-10-CM   1. Early dry stage nonexudative age-related macular  degeneration of both eyes  H35.3131   2. Retinal edema  H35.81 OCT, Retina - OU - Both Eyes  3. Diabetes mellitus type 2 without retinopathy ([place])  E11.9   4. Pseudophakia, both eyes  Z96.1   5. S/P LASIK (laser assisted in situ keratomileusis) of both eyes  Z98.890   6. Dry eyes  H04.123     1,2. Age related macular degeneration, non-exudative, OU  - exam and OCT with rare focal drusen OU -- early stage  - The incidence, anatomy, and pathology of dry AMD, risk of progression, and the AREDS and AREDS 2 studies including smoking risks discussed with patient.  - Recommend amsler grid monitoring  - f/u 6 months, sooner prn -- DFE, OCT  3. Diabetes mellitus, type 2 without retinopathy - The incidence, risk factors for progression, natural history and treatment options for diabetic retinopathy  were discussed with patient.   - The need for close monitoring of blood glucose, blood pressure, and serum lipids, avoiding cigarette or any type of tobacco, and the need for long term follow up was also discussed with patient. - f/u in 1 year, sooner prn  4. Pseudophakia OU  - s/p CE/IOL OU (Dr. Lucita Ferrara)  - IOLs in good position, doing well  - monitor  5. History of Lasik  - stable  - mild corneal scarring OS  6. Dry eyes OU  - recommend artificial tears and lubricating ointment as needed   Ophthalmic Meds Ordered this visit:  No orders of the defined types were placed in this encounter.      Return in about 6 months (around 03/27/2021) for f/u exu ARMD OU, DFE, OCT.  There are no Patient Instructions on file for this visit.   Explained the diagnoses, plan, and follow up with the patient and they expressed understanding.  Patient expressed understanding of the importance of proper follow up care.   This document serves as a record of services personally performed by Gardiner Sleeper, MD, PhD. It was created on their behalf by San Jetty. Owens Shark, OA an ophthalmic technician. The creation  of this record is the provider's dictation and/or activities during the visit.    Electronically signed by: San Jetty. Marguerita Merles 02.15.2022 12:33 PM   Gardiner Sleeper, M.D., Ph.D. Diseases & Surgery of the Retina and Vitreous [facility]  I have reviewed the above documentation for accuracy and completeness, and I agree with the above. Gardiner Sleeper, M.D., Ph.D. 09/27/20 12:33 PM   Abbreviations: M myopia (nearsighted); A astigmatism; H hyperopia (farsighted); P presbyopia; Mrx spectacle prescription;  CTL contact lenses; OD right eye; OS left eye; OU both eyes  XT exotropia; ET esotropia; PEK punctate epithelial keratitis; PEE punctate epithelial erosions; DES dry eye syndrome; MGD meibomian gland dysfunction; ATs artificial tears; PFAT's preservative free artificial tears; [place] nuclear sclerotic cataract; PSC posterior subcapsular cataract; ERM epi-retinal membrane; PVD posterior vitreous detachment; RD retinal detachment; DM diabetes mellitus; DR diabetic retinopathy;  NPDR non-proliferative diabetic retinopathy; PDR proliferative diabetic retinopathy; CSME clinically significant macular edema; DME diabetic macular edema; dbh dot blot hemorrhages; CWS cotton wool spot; POAG primary open angle glaucoma; C/D cup-to-disc ratio; HVF humphrey visual field; GVF goldmann visual field; OCT optical coherence tomography; IOP intraocular pressure; BRVO Branch retinal vein occlusion; CRVO central retinal vein occlusion; CRAO central retinal artery occlusion; BRAO branch retinal artery occlusion; RT retinal tear; SB scleral buckle; PPV pars plana vitrectomy; VH Vitreous hemorrhage; PRP panretinal laser photocoagulation; IVK intravitreal kenalog; VMT vitreomacular traction; MH Macular hole;  NVD neovascularization of the disc; NVE neovascularization elsewhere; AREDS age related eye disease study; ARMD age related macular degeneration; POAG primary open angle glaucoma; EBMD epithelial/anterior  basement membrane dystrophy; ACIOL anterior chamber intraocular lens; IOL intraocular lens; PCIOL posterior chamber intraocular lens; Phaco/IOL phacoemulsification with intraocular lens placement; [place] photorefractive keratectomy; LASIK laser assisted in situ keratomileusis; HTN hypertension; DM diabetes mellitus; COPD chronic obstructive pulmonary disease

## 2020-10-04 DIAGNOSIS — E0590 Thyrotoxicosis, unspecified without thyrotoxic crisis or storm: Secondary | ICD-10-CM | POA: Diagnosis not present

## 2020-10-04 DIAGNOSIS — E785 Hyperlipidemia, unspecified: Secondary | ICD-10-CM | POA: Diagnosis not present

## 2020-10-04 DIAGNOSIS — I10 Essential (primary) hypertension: Secondary | ICD-10-CM | POA: Diagnosis not present

## 2020-10-04 DIAGNOSIS — E538 Deficiency of other specified B group vitamins: Secondary | ICD-10-CM | POA: Diagnosis not present

## 2020-10-04 DIAGNOSIS — E1169 Type 2 diabetes mellitus with other specified complication: Secondary | ICD-10-CM | POA: Diagnosis not present

## 2020-10-11 DIAGNOSIS — I10 Essential (primary) hypertension: Secondary | ICD-10-CM | POA: Diagnosis not present

## 2020-10-11 DIAGNOSIS — E785 Hyperlipidemia, unspecified: Secondary | ICD-10-CM | POA: Diagnosis not present

## 2020-10-11 DIAGNOSIS — E538 Deficiency of other specified B group vitamins: Secondary | ICD-10-CM | POA: Diagnosis not present

## 2020-10-11 DIAGNOSIS — E0590 Thyrotoxicosis, unspecified without thyrotoxic crisis or storm: Secondary | ICD-10-CM | POA: Diagnosis not present

## 2020-10-11 DIAGNOSIS — F331 Major depressive disorder, recurrent, moderate: Secondary | ICD-10-CM | POA: Diagnosis not present

## 2020-10-11 DIAGNOSIS — E1169 Type 2 diabetes mellitus with other specified complication: Secondary | ICD-10-CM | POA: Diagnosis not present

## 2020-10-20 ENCOUNTER — Other Ambulatory Visit: Payer: Self-pay | Admitting: Family Medicine

## 2020-10-20 DIAGNOSIS — R918 Other nonspecific abnormal finding of lung field: Secondary | ICD-10-CM

## 2020-11-08 ENCOUNTER — Other Ambulatory Visit: Payer: Medicare HMO

## 2020-11-21 ENCOUNTER — Ambulatory Visit
Admission: RE | Admit: 2020-11-21 | Discharge: 2020-11-21 | Disposition: A | Discharge status: Home / Self Care | Payer: Medicare Other | Source: Ambulatory Visit | Attending: Family Medicine | Admitting: Family Medicine

## 2020-11-21 DIAGNOSIS — R918 Other nonspecific abnormal finding of lung field: Secondary | ICD-10-CM

## 2020-11-21 DIAGNOSIS — I2510 Atherosclerotic heart disease of native coronary artery without angina pectoris: Secondary | ICD-10-CM | POA: Diagnosis not present

## 2020-11-21 DIAGNOSIS — R911 Solitary pulmonary nodule: Secondary | ICD-10-CM | POA: Diagnosis not present

## 2020-11-21 DIAGNOSIS — I700 Atherosclerosis of aorta: Secondary | ICD-10-CM | POA: Diagnosis not present

## 2020-12-10 IMAGING — CT CT HEAD W/O CM
2 series · 15 of 37 positions shown, 18 images · non-contrast
Comparison: Head and cervical spine CT yesterday.

CLINICAL DATA: 68-year-old female status post fall yesterday and
again today.

EXAM:
CT HEAD WITHOUT CONTRAST
TECHNIQUE: Contiguous axial images were obtained from the base of the skull
through the vertex without intravenous contrast.

[Series 3: head wo · axial · 0.45mm/px · z∈[-139,+6]mm · 12 of 35 slices shown, 15 images]
[im 3/35  brain]
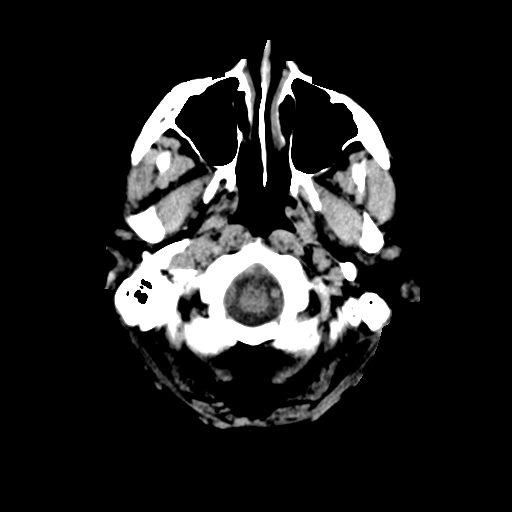
[im 3/35  bone]
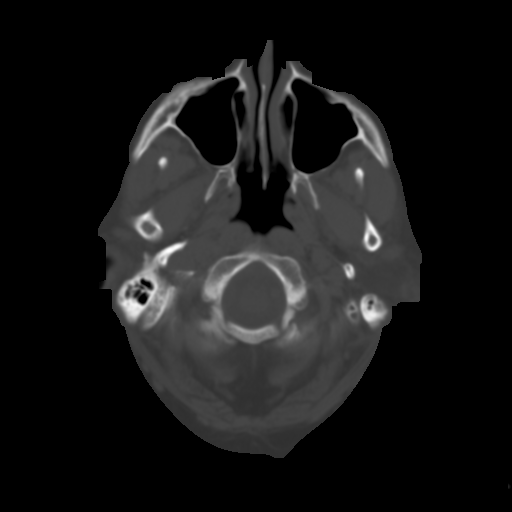
[im 5/35  brain]
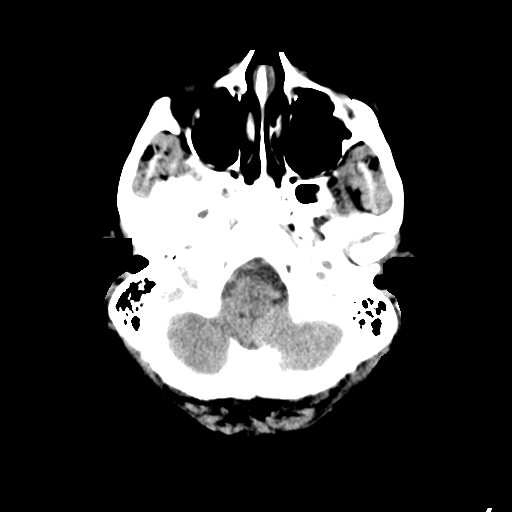
[im 8/35  brain]
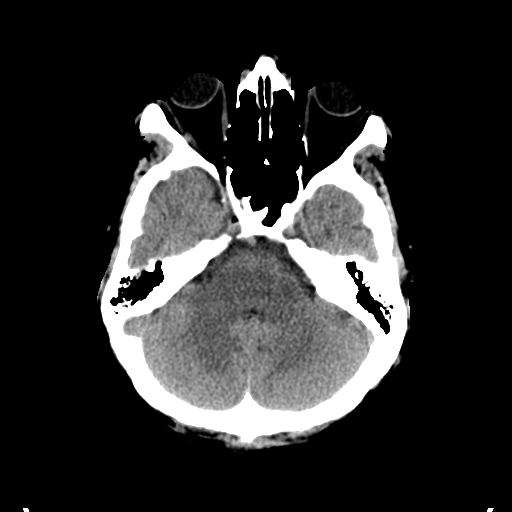
[im 11/35  brain]
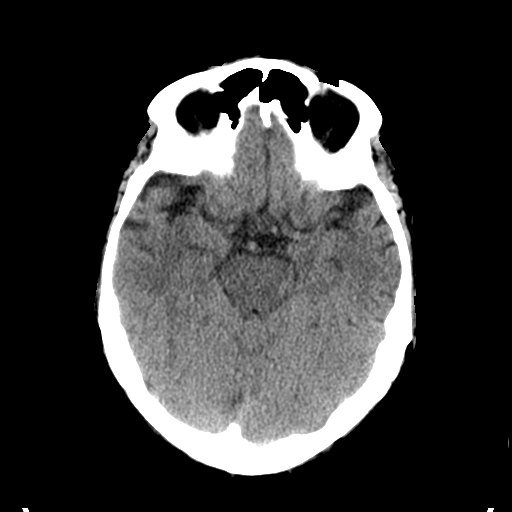
[im 13/35  brain]
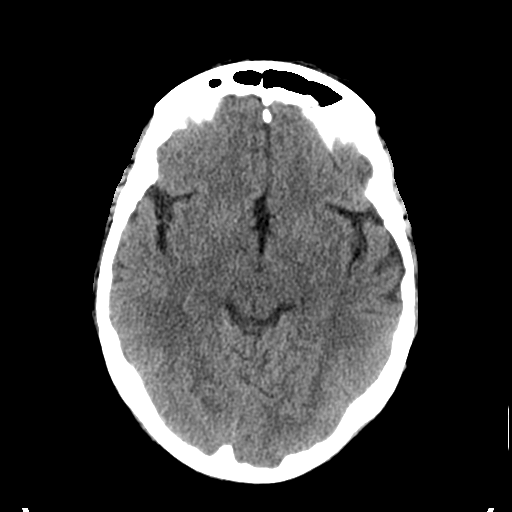
[im 13/35  bone]
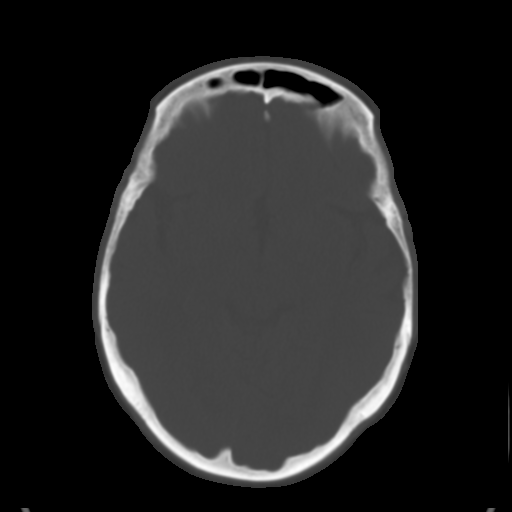
[im 16/35  brain]
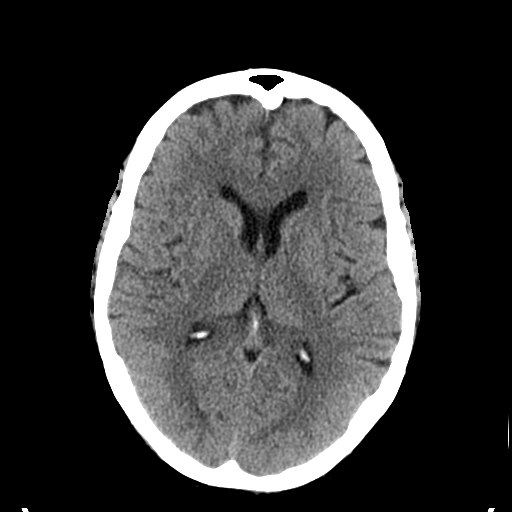
[im 19/35  brain]
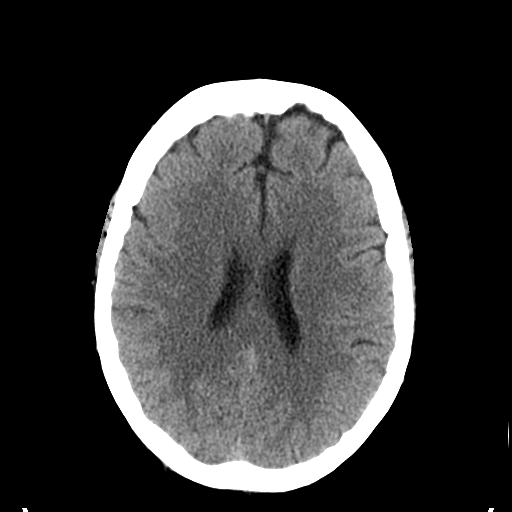
[im 22/35  brain]
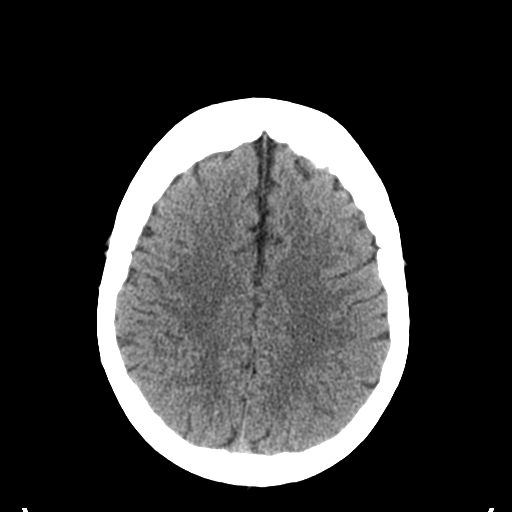
[im 24/35  brain]
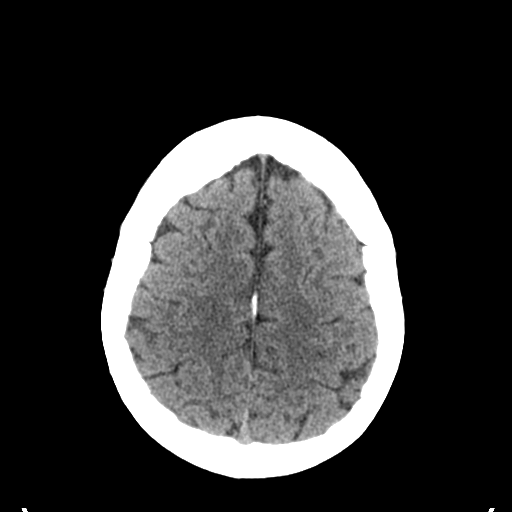
[im 24/35  bone]
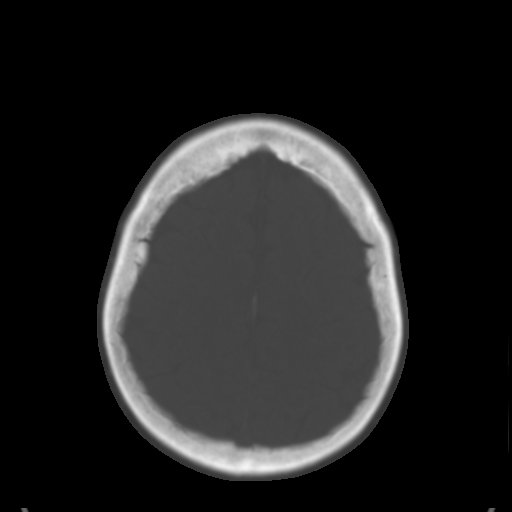
[im 27/35  brain]
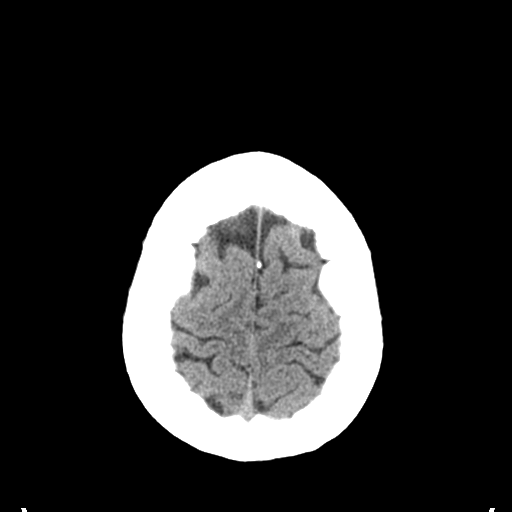
[im 30/35  brain]
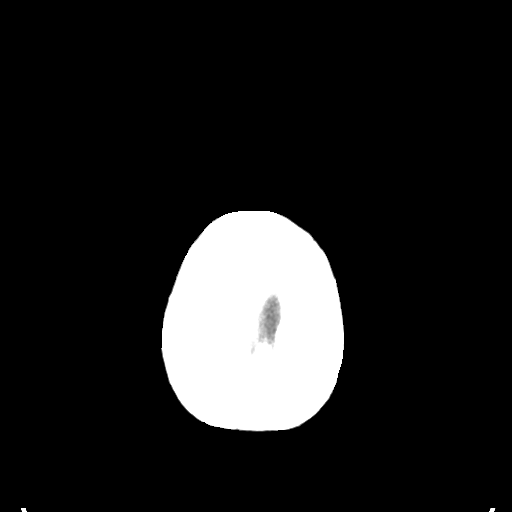
[im 32/35  brain]
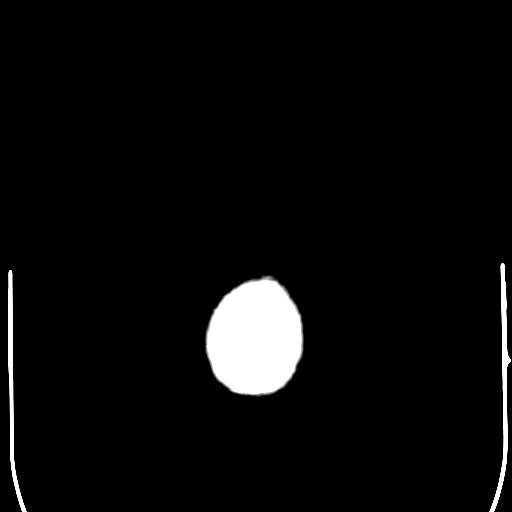

[Series 7: sagittal soft tissue · sagittal · 0.35mm/px · 3 of 57 slices shown]
[im 19/57  brain]
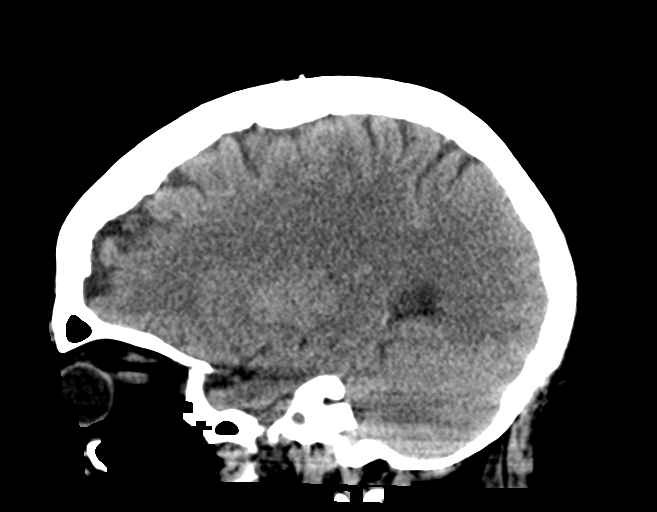
[im 29/57  brain]
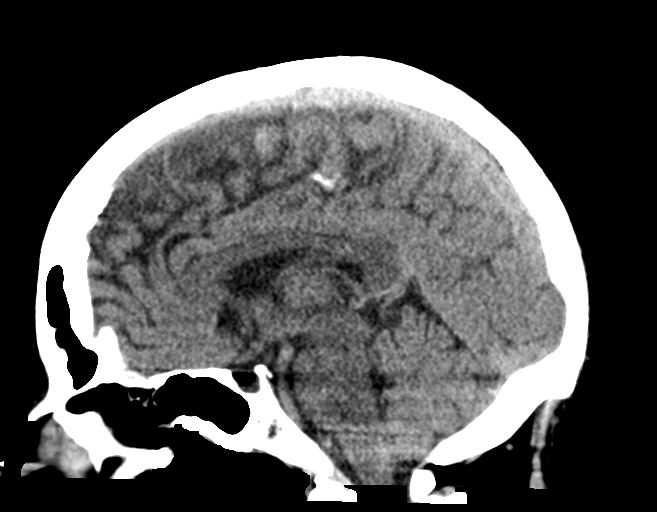
[im 38/57  brain]
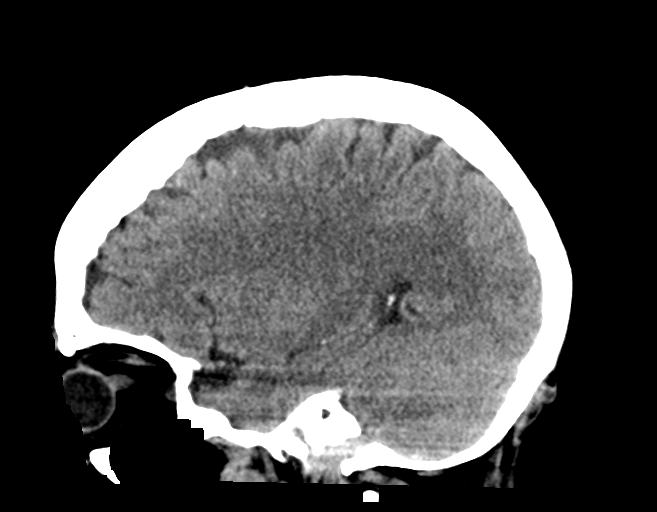

[15 of 37 positions shown; findings below may reference images not displayed]

FINDINGS: Brain: Cerebral volume is within normal limits for age. No midline
shift, ventriculomegaly, mass effect, evidence of mass lesion,
intracranial hemorrhage or evidence of cortically based acute
infarction. Gray-white matter differentiation is within normal
limits throughout the brain.

Vascular: Mild Calcified atherosclerosis at the skull base. No
suspicious intracranial vascular hyperdensity.

Skull: Stable and intact.

Sinuses/Orbits: Visualized paranasal sinuses and mastoids are stable
and well pneumatized.

Other: No acute orbit or scalp soft tissue finding.
IMPRESSION: Stable and normal for age non contrast CT appearance of the brain.

## 2020-12-10 IMAGING — CT CT ABD-PELV W/ CM
2 of 4 series · 13 of 36 positions shown, 16 images · IV contrast (omnipaque)
Comparison: September 30, 2018

CLINICAL DATA: Fall through broken deck yesterday now fall out of
wheelchair

EXAM:
CT CHEST, abdomen, pelvis WITH CONTRAST
TECHNIQUE: Multidetector CT imaging of the chest, abdomen, pelvis was performed
during intravenous contrast administration.
CONTRAST:  100mL OMNIPAQUE IOHEXOL 300 MG/ML  SOLN

[Series 2: cap with · axial · 0.77mm/px · z∈[-598,-98]mm · 10 of 122 slices shown, 13 images]
[im 11/122  mediastinal]
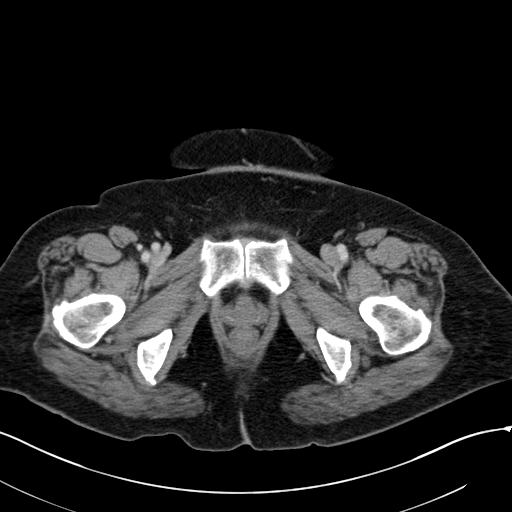
[im 11/122  lung]
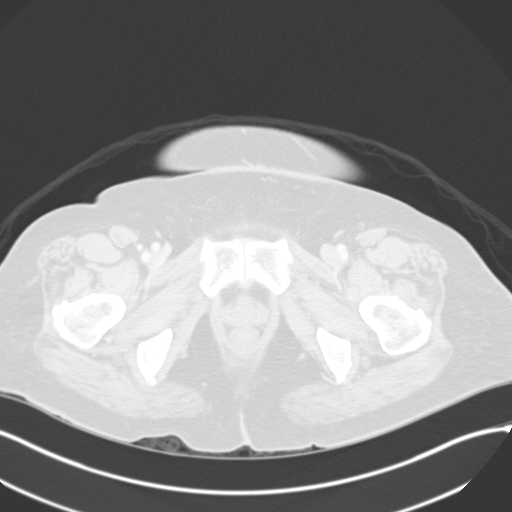
[im 21/122  lung]
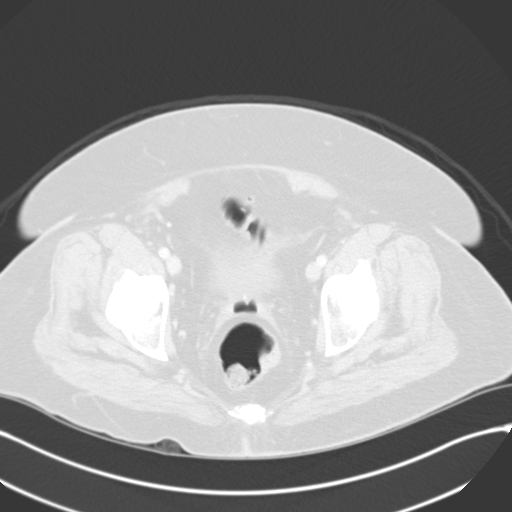
[im 31/122  lung]
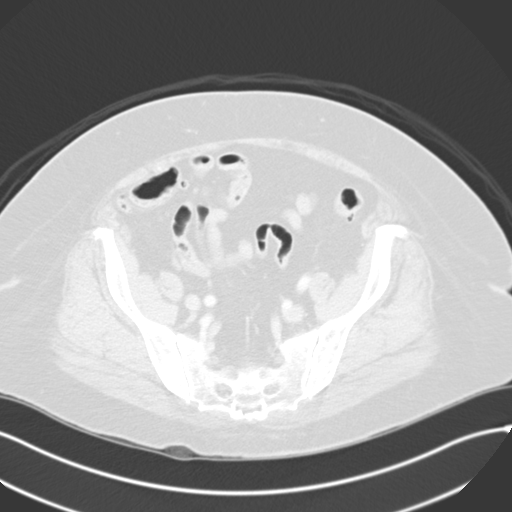
[im 41/122  lung]
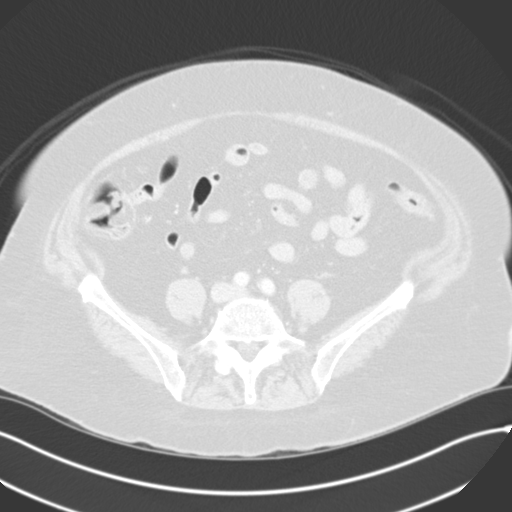
[im 51/122  mediastinal]
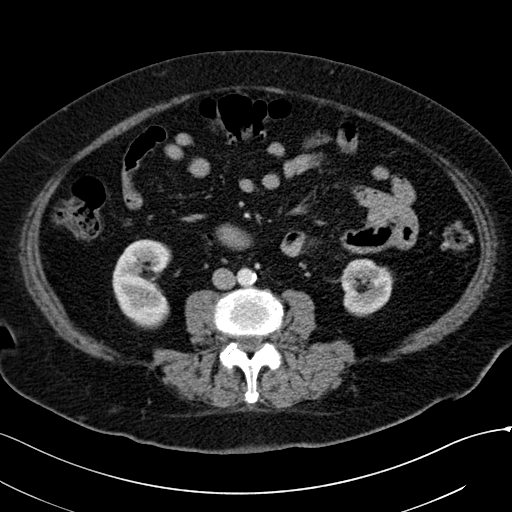
[im 51/122  lung]
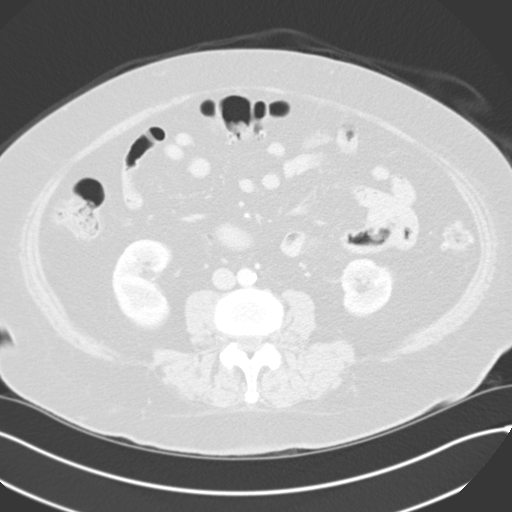
[im 71/122  lung]
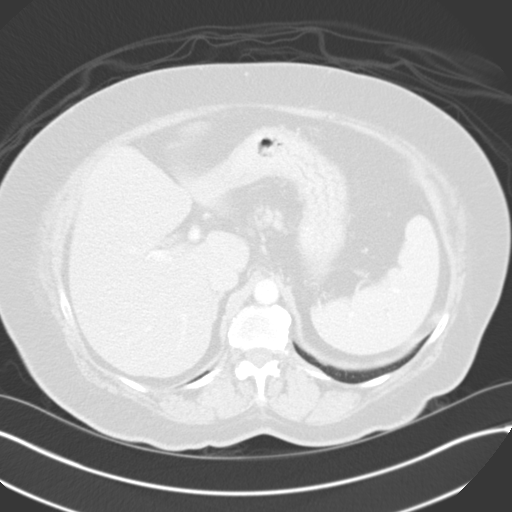
[im 81/122  lung]
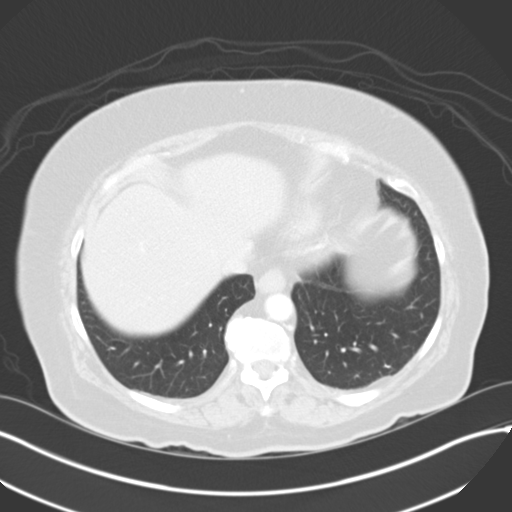
[im 91/122  lung]
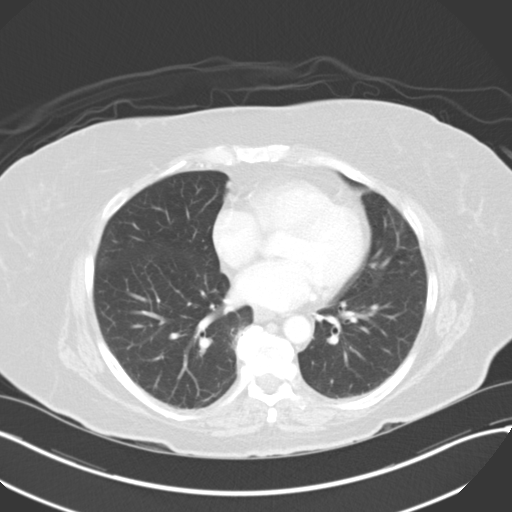
[im 101/122  mediastinal]
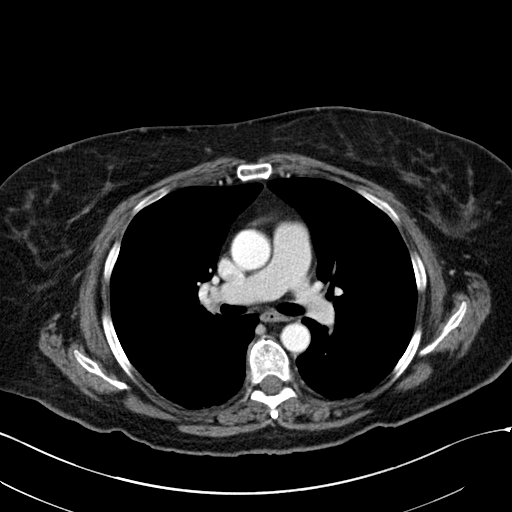
[im 101/122  lung]
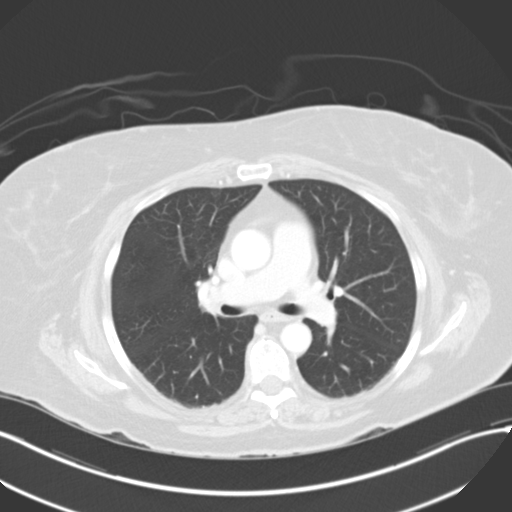
[im 111/122  lung]
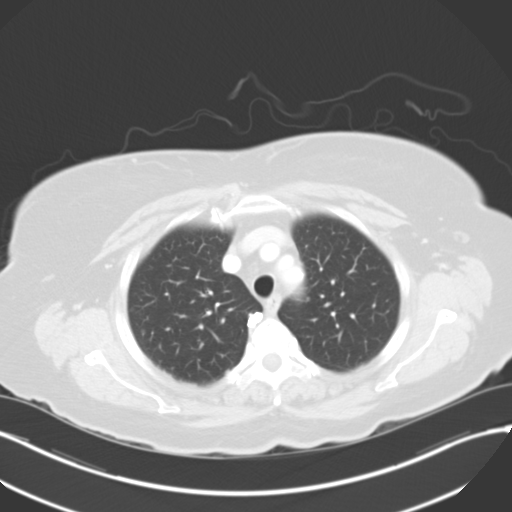

[Series 5: coronals · coronal · 0.89mm/px · 3 of 157 slices shown]
[im 32/157  lung]
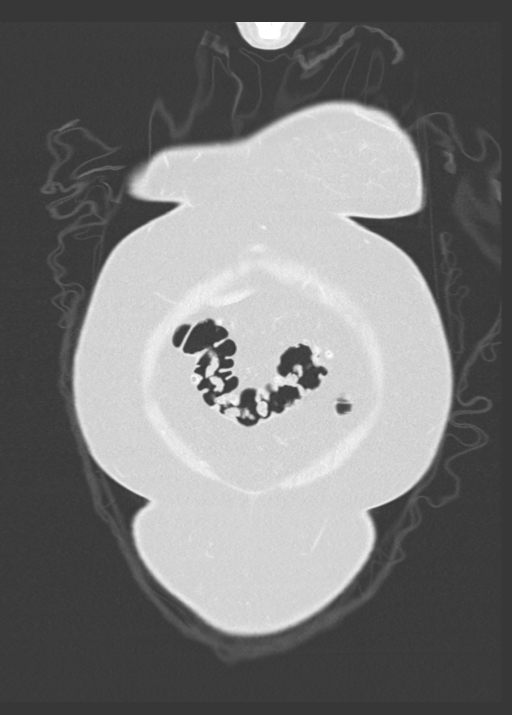
[im 63/157  lung]
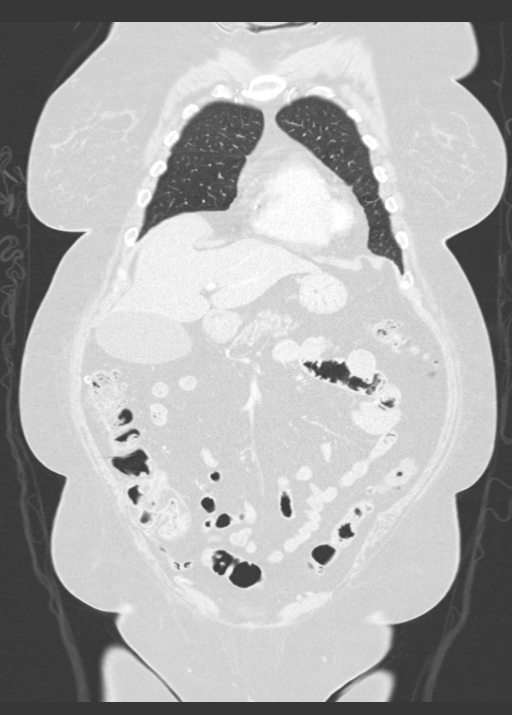
[im 94/157  lung]
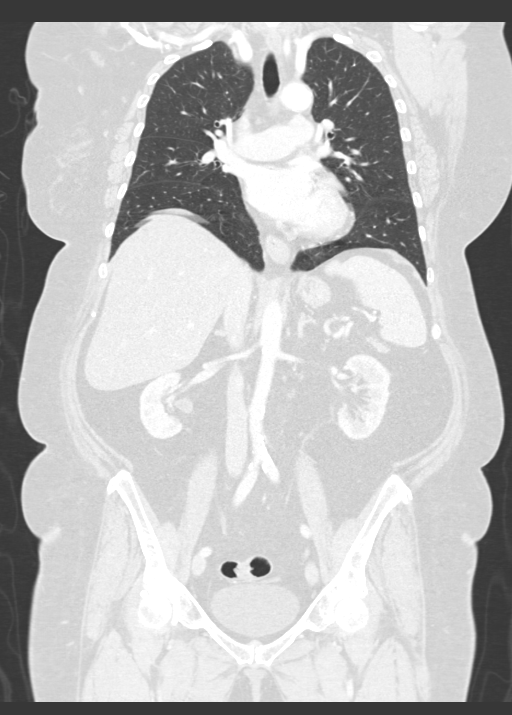

[13 of 36 positions shown; findings below may reference images not displayed]

FINDINGS: Cardiovascular: Normal heart size. No significant pericardial
fluid/thickening. Great vessels are normal in course and caliber. No
evidence of acute thoracic aortic injury. No central pulmonary
emboli. Scattered aortic atherosclerotic calcifications are seen
without aneurysmal dilatation. Coronary artery calcifications are
seen.

Mediastinum/Nodes: No pneumomediastinum. No mediastinal hematoma.
Unremarkable esophagus. No axillary, mediastinal or hilar
lymphadenopathy.There are bilateral low-density lesions seen
throughout the thyroid gland the largest within the right thyroid
lobe measuring 1.2 cm. This was seen on a prior CT of 0417.

Lungs/Pleura:Minimal ground-glass opacity seen within the right
upper lung. For for for no pneumothorax. No pleural effusion.

Musculoskeletal: There is cement fixation seen within the T3 T8 and
T10 vertebral bodies. There appears to be extruded cement seen
anteriorly at the T3 vertebral body. There is chronic slight
anterior wedge compression deformity also seen of the T7 vertebral
body as on prior MRI 0928 with less than 25% loss in height. No
fracture seen in the thorax.

Hepatobiliary: There is diffuse low density seen throughout the
liver parenchyma. No focal hepatic lesion however is noted. No focal
lesion. Gallbladder physiologically distended, no calcified stone.
No biliary dilatation.

Pancreas: No evidence for traumatic injury. Portions are partially
obscured by adjacent bowel loops and paucity of intra-abdominal fat.
No ductal dilatation or inflammation.

Spleen: Homogeneous attenuation without traumatic injury. Normal in
size.

Adrenals/Urinary Tract: No adrenal hemorrhage. Kidneys demonstrate
symmetric enhancement and excretion on delayed phase imaging. No
evidence or renal injury. Ureters are well opacified proximal
through mid portion. Bladder is physiologically distended without
wall thickening.

Stomach/Bowel: Suboptimally assessed without enteric contrast,
allowing for this, no evidence of bowel injury. Stomach
physiologically distended. There are no dilated or thickened small
or large bowel loops. Scattered colonic diverticula without
diverticulitis. Moderate stool burden. No evidence of mesenteric
hematoma. No free air free fluid.

Vascular/Lymphatic: No acute vascular injury. The abdominal aorta
and IVC are intact. Of no evidence of retroperitoneal, abdominal, or
pelvic adenopathy.

Reproductive: No acute abnormality. The patient is status post
hysterectomy.

Other: No focal contusion or abnormality of the abdominal wall.

Musculoskeletal: No acute fracture of the lumbar spine or bony
pelvis.
IMPRESSION: 1. No acute intrathoracic, abdominal, or pelvic injury.
2. Prior cement fixation of the T3, T8, T10 vertebral bodies.
3. Chronic slight anterior compression deformity of the T7 vertebral
body with less than 25% loss in height.
4. Diverticula without diverticulitis.
5.  Aortic Atherosclerosis ([id number]).

## 2020-12-10 IMAGING — CR DG TIBIA/FIBULA 2V*R*
3 series · 3 of 3 positions shown · non-contrast
Comparison: Right foot series today. Right foot CT yesterday. Right
knee series 05/03/2017.

CLINICAL DATA: 68-year-old female status post fall 3 broken deck
yesterday with fractured right foot. Fall from wheelchair today.

EXAM:
RIGHT TIBIA AND FIBULA - 2 VIEW

[x tib-fib lat right (1 of 2)]
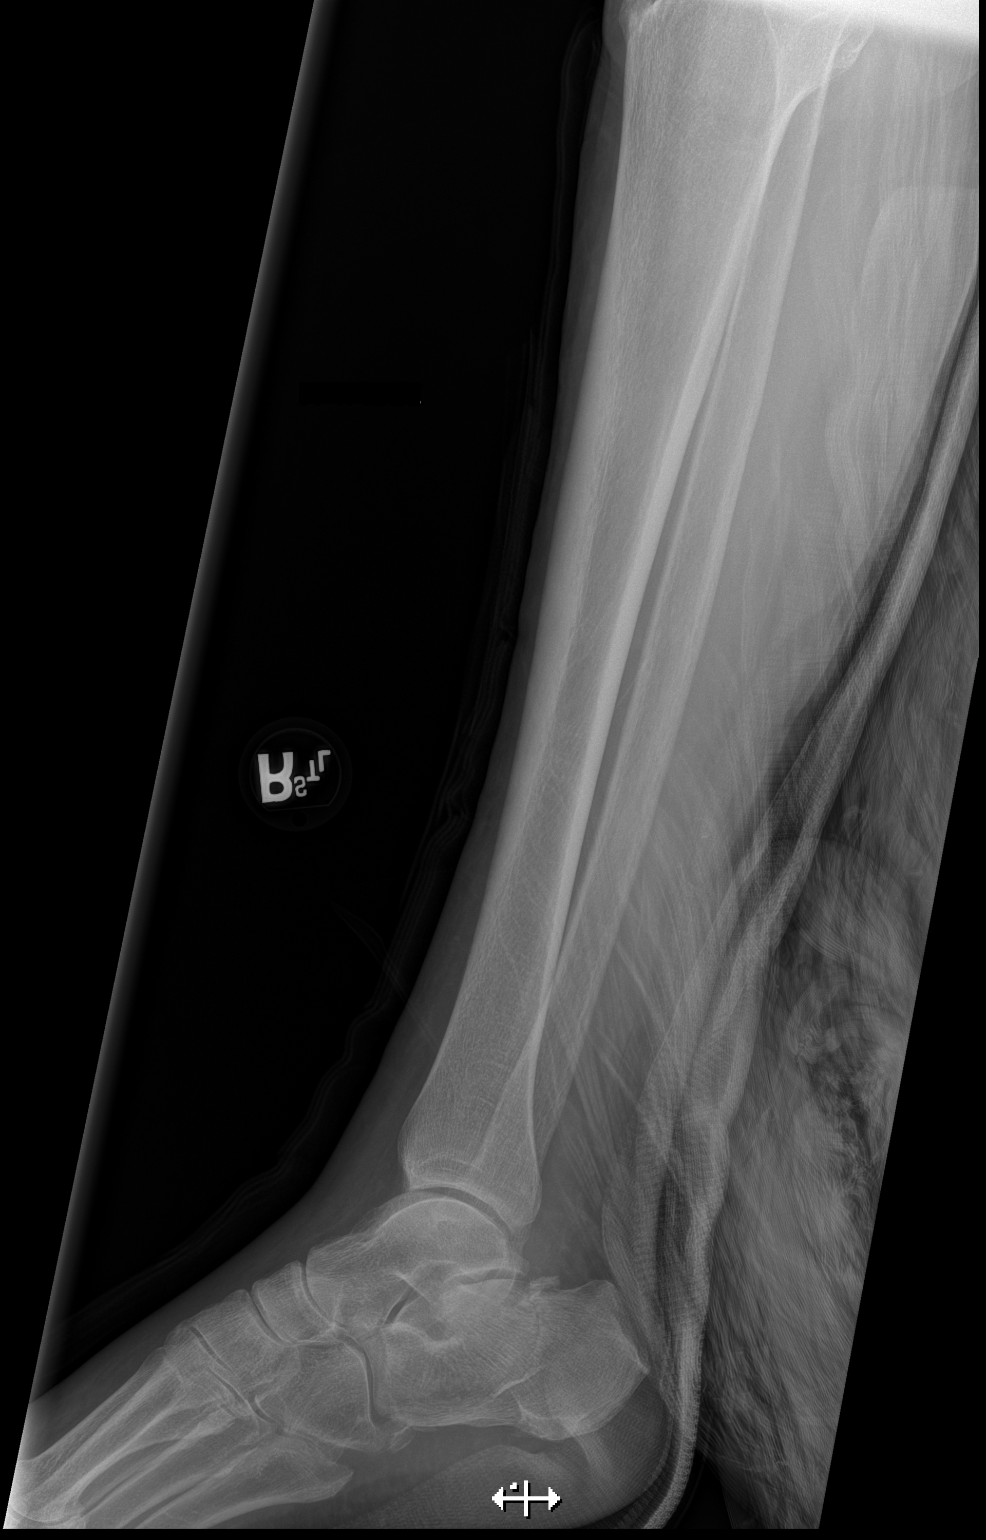

[x tib-fib lat right (2 of 2)]
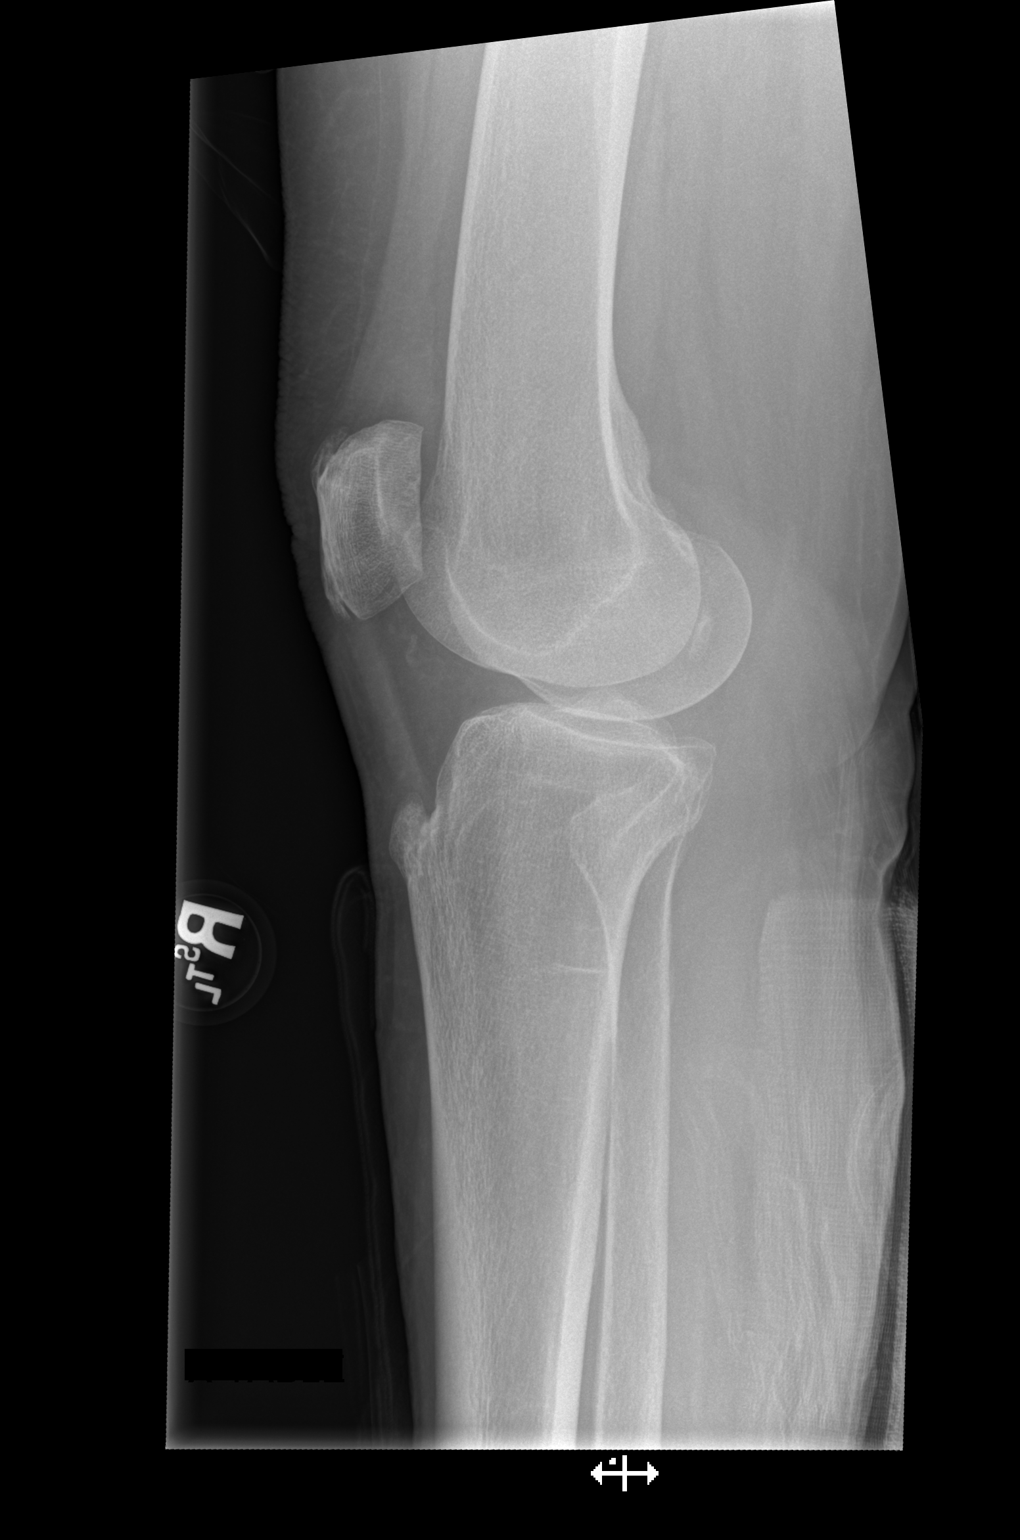

[x tib-fib ap right]
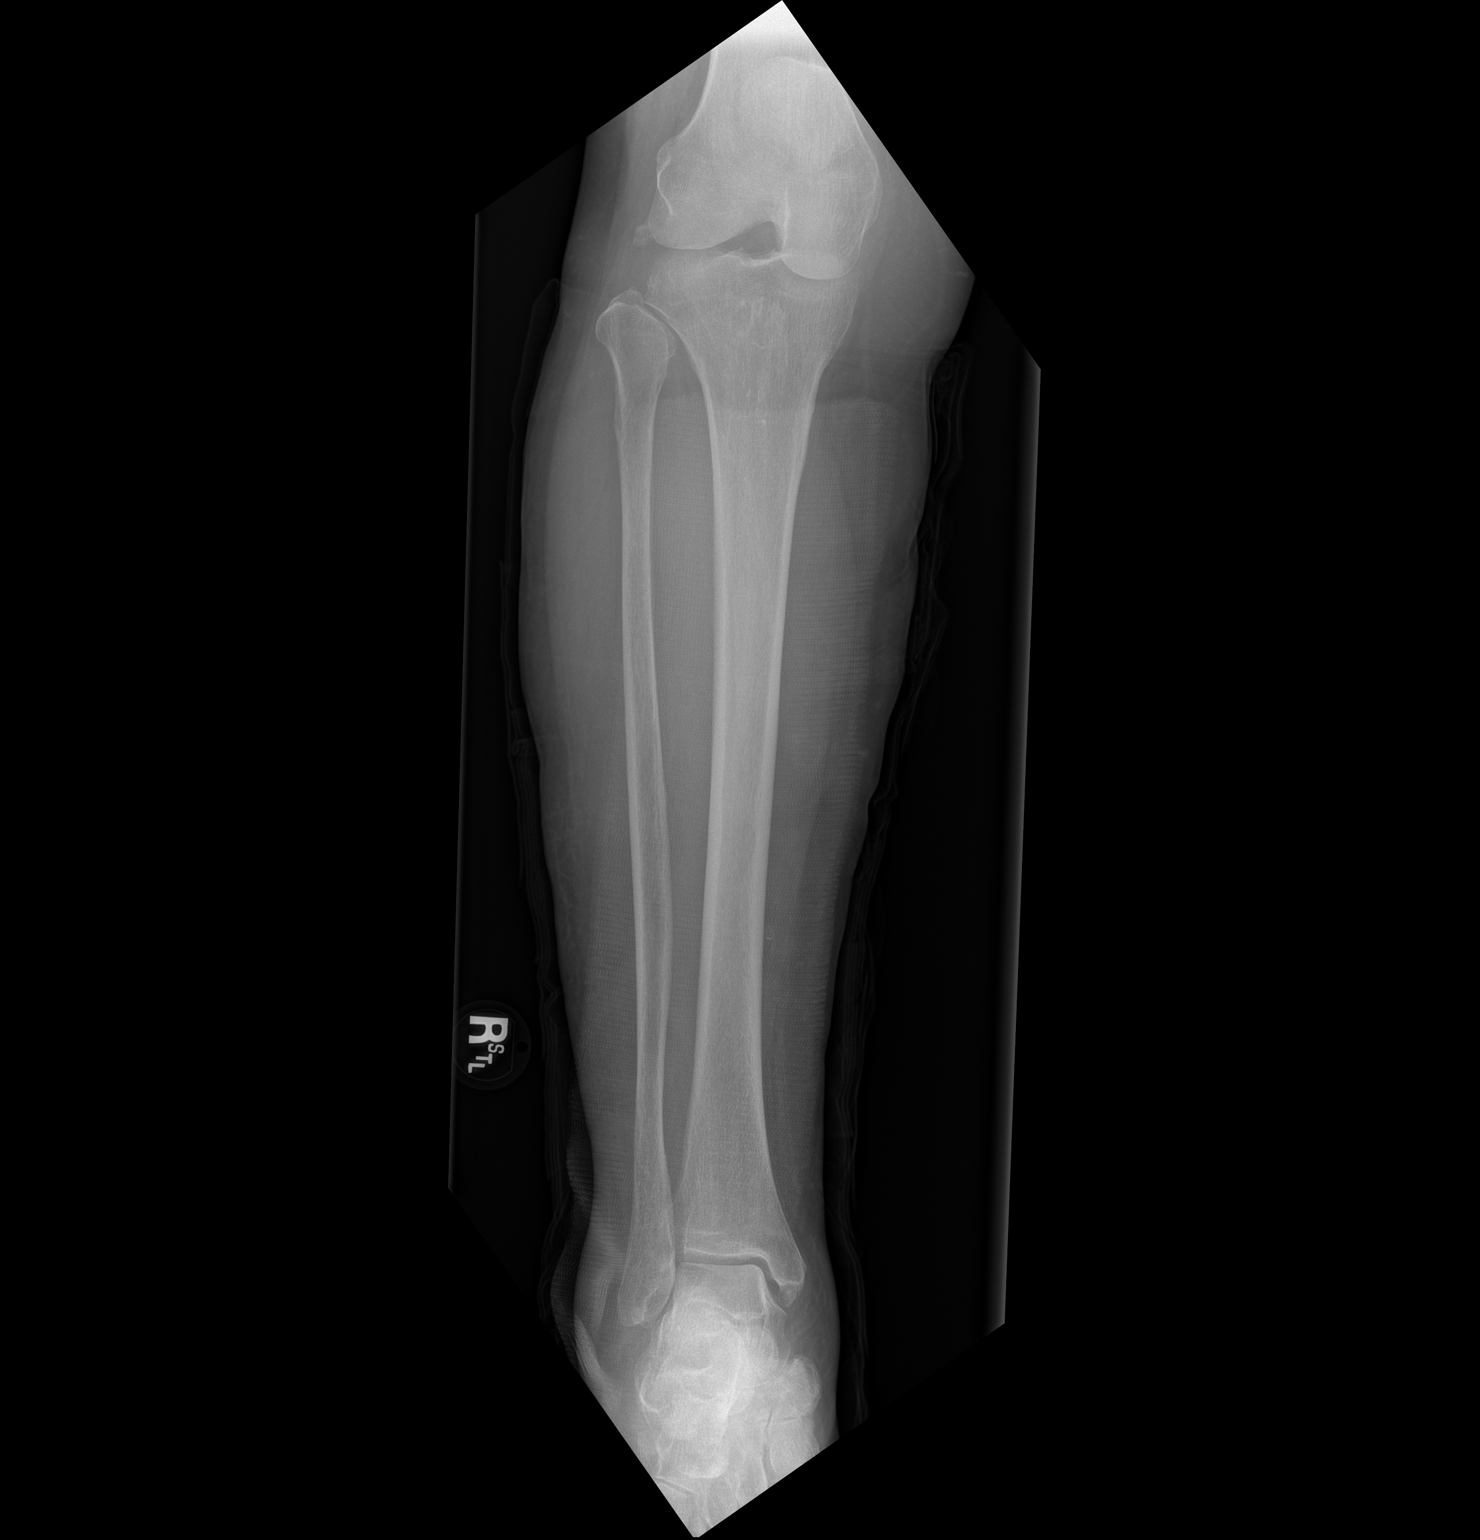

[3 of 3 positions shown; findings below may reference images not displayed]

FINDINGS: Comminuted fracture of the right calcaneus as reported yesterday.
Mortise joint alignment appears preserved. Knee joint alignment
appears preserved. On the cross-table lateral view there is
suggestion of small right knee joint effusion. Patella appears
stable and intact. No acute right tibia or fibula fracture
identified. Fabella move again noted.
IMPRESSION: 1. No acute right tibia or fibula fracture identified. Comminuted
right calcaneus fracture as reported yesterday.
2. Possible small right knee joint effusion.

## 2021-03-03 DIAGNOSIS — E538 Deficiency of other specified B group vitamins: Secondary | ICD-10-CM | POA: Diagnosis not present

## 2021-03-03 DIAGNOSIS — I10 Essential (primary) hypertension: Secondary | ICD-10-CM | POA: Diagnosis not present

## 2021-03-03 DIAGNOSIS — L309 Dermatitis, unspecified: Secondary | ICD-10-CM | POA: Diagnosis not present

## 2021-03-03 DIAGNOSIS — Z7984 Long term (current) use of oral hypoglycemic drugs: Secondary | ICD-10-CM | POA: Diagnosis not present

## 2021-03-03 DIAGNOSIS — E1169 Type 2 diabetes mellitus with other specified complication: Secondary | ICD-10-CM | POA: Diagnosis not present

## 2021-03-09 DIAGNOSIS — L308 Other specified dermatitis: Secondary | ICD-10-CM | POA: Diagnosis not present

## 2021-03-21 ENCOUNTER — Other Ambulatory Visit: Payer: Self-pay | Admitting: Family Medicine

## 2021-03-21 DIAGNOSIS — R911 Solitary pulmonary nodule: Secondary | ICD-10-CM

## 2021-03-27 ENCOUNTER — Encounter (INDEPENDENT_AMBULATORY_CARE_PROVIDER_SITE_OTHER): Payer: Medicare Other | Admitting: Ophthalmology

## 2021-03-28 DIAGNOSIS — R600 Localized edema: Secondary | ICD-10-CM | POA: Diagnosis not present

## 2021-03-28 DIAGNOSIS — M79605 Pain in left leg: Secondary | ICD-10-CM | POA: Diagnosis not present

## 2021-03-28 DIAGNOSIS — M79604 Pain in right leg: Secondary | ICD-10-CM | POA: Diagnosis not present

## 2021-03-28 DIAGNOSIS — I83893 Varicose veins of bilateral lower extremities with other complications: Secondary | ICD-10-CM | POA: Diagnosis not present

## 2021-03-29 ENCOUNTER — Ambulatory Visit
Admission: RE | Admit: 2021-03-29 | Discharge: 2021-03-29 | Disposition: A | Discharge status: Home / Self Care | Payer: Medicare Other | Source: Ambulatory Visit | Attending: Family Medicine | Admitting: Family Medicine

## 2021-03-29 ENCOUNTER — Other Ambulatory Visit: Payer: Self-pay

## 2021-03-29 DIAGNOSIS — R911 Solitary pulmonary nodule: Secondary | ICD-10-CM | POA: Diagnosis not present

## 2021-03-29 DIAGNOSIS — I700 Atherosclerosis of aorta: Secondary | ICD-10-CM | POA: Diagnosis not present

## 2021-04-07 ENCOUNTER — Other Ambulatory Visit: Payer: Self-pay

## 2021-04-07 ENCOUNTER — Encounter: Payer: Self-pay | Admitting: Emergency Medicine

## 2021-04-07 ENCOUNTER — Telehealth: Payer: Self-pay | Admitting: Emergency Medicine

## 2021-04-07 ENCOUNTER — Ambulatory Visit (INDEPENDENT_AMBULATORY_CARE_PROVIDER_SITE_OTHER): Payer: Medicare Other | Admitting: Emergency Medicine

## 2021-04-07 DIAGNOSIS — R911 Solitary pulmonary nodule: Secondary | ICD-10-CM | POA: Insufficient documentation

## 2021-04-07 NOTE — Patient Instructions (Signed)
We reviewed your CT scans of the chest today We will perform pulmonary function testing We will perform a PET scan Follow with Dr. Lamonte Sakai next available after these tests so that we can review the results and decide next steps.  Suspect that we will refer you to thoracic surgery for removal of a small right upper lobe pulmonary nodule.  We will discuss in detail next time.

## 2021-04-07 NOTE — Addendum Note (Signed)
Addended by: Gavin Potters R on: 04/07/2021 11:52 AM   Modules accepted: Orders

## 2021-04-07 NOTE — Assessment & Plan Note (Signed)
1.6 cm groundglass right upper lobe pulmonary nodule that has grown on serial films, almost certainly highly differentiated adenocarcinoma.  Discussed possible bronchoscopy versus surgical resection with her today.  I think she is likely a candidate for resection.  We will obtain PFT, PET scan to ensure that she is a good candidate.  If so then I will refer her for resection, possibly a combined procedure for dye marking or fiducial placement.  If we do not believe she could tolerate surgical removal than high will arrange bronchoscopy, biopsies to facilitate SBRT.

## 2021-04-07 NOTE — Progress Notes (Signed)
Subjective:    Patient ID: Jenny Cardenas, female    DOB: 1951/01/30, 70 y.o.   MRN: [id number]  HPI 70 year old woman with history of minimal tobacco use (2 pack years), obesity, diabetes, GERD/PUD, hypertension, DDD with back pain.  She is here to evaluate an abnormal CT scan of the chest. She had a CT chest 07/12/2019 after a traumatic fall that identified a right upper lobe groundglass opacity.  This was followed with serial imaging 10/26/2019, measured 12 x 8 mm, as well as scattered smaller nodules most recent scans are 11/21/2020 where it had increased in size to 1.5 x 1.1 cm.  The other nodules are unchanged.  Most recent scan 03/29/2021 as below. She denies any SOB, good functional capacity. No cough. No wheeze.   CT chest 03/29/2021 reviewed by me, shows continued increase in size in a 1.6 x 1.0 cm posterior right upper lobe groundglass pulmonary nodule.  There are stable right middle lobe and other scattered nodules consistent with either small granulomas or cement emboli.   Review of Systems As per HPI  Past Medical History:  Diagnosis Date   Anxiety    Arthritis    Carpal tunnel syndrome    Cataract    Depression    Diabetes mellitus without complication ([place])    Type 2   Fall April 10, 2015   GERD (gastroesophageal reflux disease)    Goiter    History of shingles    On abdomen, left ring finger, and left leg; Pt takes Valtrex   Hyperlipidemia    Hypertension    Obesity    PTSD (post-traumatic stress disorder)    Shortness of breath dyspnea    pt stated its related to the back problems she has   Thyroid disease    Ulcer      Family History  Problem Relation Age of Onset   COPD Mother    Depression Mother    Hypertension Mother    Hyperlipidemia Mother    Breast cancer Mother 35   Alzheimer's disease Father    Heart disease Father    Breast cancer Sister 74   Colon cancer Neg Hx      Social History   Socioeconomic History   Marital status: Widowed     Spouse name: Not on file   Number of children: Not on file   Years of education: Not on file   Highest education level: Not on file  Occupational History   Not on file  Tobacco Use   Smoking status: Former    Packs/day: 0.50    Years: 4.00    Pack years: 2.00    Types: Cigarettes    Quit date: 06/28/2016    Years since quitting: 4.7   Smokeless tobacco: Never  Vaping Use   Vaping Use: Never used  Substance and Sexual Activity   Alcohol use: Yes    Comment: rare glass of wine (holidays only)   Drug use: No   Sexual activity: Not Currently  Other Topics Concern   Not on file  Social History Narrative   Not on file   Social Determinants of Health   Financial Resource Strain: Not on file  Food Insecurity: Not on file  Transportation Needs: Not on file  Physical Activity: Not on file  Stress: Not on file  Social Connections: Not on file  Intimate Partner Violence: Not on file    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   10   ALPRAZolam (XANAX) 0.5 MG tablet Take 1 tablet (0.5 mg total) by mouth 3 (three) times daily as needed for anxiety. 10 tablet 0   amphetamine-dextroamphetamine (ADDERALL) 10 MG tablet Take 1 tablet (10 mg total) by mouth every morning. 10 tablet 0   buPROPion (WELLBUTRIN XL) 150 MG 24 hr tablet Take 150 mg by mouth daily.     cycloSPORINE (RESTASIS) 0.05 % ophthalmic emulsion Place 1 drop into both eyes 2 (two) times daily.     furosemide (LASIX) 20 MG tablet Take 20 mg by mouth daily.     Ibuprofen-diphenhydrAMINE Cit 200-38 MG TABS  Take 1 tablet by mouth at bedtime as needed (sleep).     ketoconazole (NIZORAL) 2 % cream Apply 1 application topically daily as needed for irritation.     levothyroxine (SYNTHROID, LEVOTHROID) 25 MCG tablet Take 25 mcg by mouth daily before breakfast.     mupirocin ointment (BACTROBAN) 2 % Apply 1 application topically daily as needed (for mask dermatitis).     oxyCODONE-acetaminophen (PERCOCET/ROXICET) 5-325 MG tablet Take 1-2 tablets by mouth every 6 (six) hours as needed for moderate pain or severe pain. 10 tablet 0   pantoprazole (PROTONIX) 40 MG tablet Take 40 mg by mouth daily.     pravastatin (PRAVACHOL) 10 MG tablet Take 10 mg by mouth daily.     sertraline (ZOLOFT) 100 MG tablet Take 150 mg by mouth daily. Take 150 mg (1 whole tablet and 1/2 tablet) by mouth once daily.     tiZANidine (ZANAFLEX) 4 MG tablet Take 1 tablet (4 mg total) by mouth at bedtime. 10 tablet 0   triamcinolone cream (KENALOG) 0.1 % Apply 1 application topically.     valACYclovir (VALTREX) 500 MG tablet Take 1 tablet (500 mg total) by mouth daily. 30 tablet 11   Cyanocobalamin (VITAMIN B 12) 500 MCG TABS Take 500 mg by mouth once a week. (Patient not taking: Reported on 04/07/2021)     metFORMIN (GLUCOPHAGE-XR) 500 MG 24 hr tablet Take 500 mg by mouth every evening. (Patient not taking: Reported on 04/07/2021)     Vitamin D, Ergocalciferol, (DRISDOL) 1.25 MG (50000 UT) CAPS capsule 1 CAPSULE ONCE A WEEK X 8 WEEKS (Patient not taking: No sig reported)     No facility-administered medications prior to visit.         Objective:   Physical Exam Vitals:   04/07/21 1031  BP: 126/70  Pulse: 88  Temp: 98.1 F (36.7 C)  TempSrc: Oral  SpO2: 98%  Weight: 167 lb (75.8 kg)  Height: 5\' 2"  (1.575 m)   Gen: Pleasant,overwt woman, in no distress,  normal affect  ENT: No lesions,  mouth clear,  oropharynx clear, no postnasal drip  Neck: No JVD, no stridor  Lungs: No use of accessory muscles, no crackles or wheezing  on normal respiration, no wheeze on forced expiration  Cardiovascular: RRR, heart sounds normal, no murmur or gallops, trace peripheral edema, compression hose in place  Musculoskeletal: No deformities, no cyanosis or clubbing  Neuro: alert,  awake, non focal  Skin: Warm, no lesions or rash      Assessment & Plan:    Pulmonary nodule 1 cm or greater in diameter 1.6 cm groundglass right upper lobe pulmonary nodule that has grown on serial films, almost certainly highly differentiated adenocarcinoma.  Discussed possible bronchoscopy versus surgical resection with her today.  I think she is likely a candidate for resection.  We will obtain PFT, PET scan to ensure that she is a good candidate.  If so then I will refer her for resection, possibly a combined procedure for dye marking or fiducial placement.  If we do not believe she could tolerate surgical removal than high will arrange bronchoscopy, biopsies to facilitate SBRT.  Time spent 47 minutes  Baltazar Apo, MD, PhD 04/07/2021, 11:14 AM [facility] [phone] or if no answer before 7:00PM call [phone] For any issues after 7:00PM please call eLink [phone]

## 2021-04-10 NOTE — Telephone Encounter (Signed)
Spoke with pt who states PET will not be done until 04/27/21. Current OV is scheduled for 04/18/21 with PFT on 04/17/21. Dr. Lamonte Sakai would you like me to move pt's OV until after PET is preformed. Please advise. Thank you

## 2021-04-10 NOTE — Telephone Encounter (Signed)
Lm for patient.  

## 2021-04-10 NOTE — Telephone Encounter (Signed)
Yes that makes more sense

## 2021-04-11 ENCOUNTER — Encounter (INDEPENDENT_AMBULATORY_CARE_PROVIDER_SITE_OTHER): Payer: Medicare Other | Admitting: Ophthalmology

## 2021-04-11 DIAGNOSIS — Z9889 Other specified postprocedural states: Secondary | ICD-10-CM

## 2021-04-11 DIAGNOSIS — H04123 Dry eye syndrome of bilateral lacrimal glands: Secondary | ICD-10-CM

## 2021-04-11 DIAGNOSIS — H3581 Retinal edema: Secondary | ICD-10-CM

## 2021-04-11 DIAGNOSIS — Z961 Presence of intraocular lens: Secondary | ICD-10-CM

## 2021-04-11 DIAGNOSIS — E119 Type 2 diabetes mellitus without complications: Secondary | ICD-10-CM

## 2021-04-11 DIAGNOSIS — H353131 Nonexudative age-related macular degeneration, bilateral, early dry stage: Secondary | ICD-10-CM

## 2021-04-12 NOTE — Telephone Encounter (Signed)
ATC to reschedule OV appt but no answer and no voicemail. Will attempt to call pt at later time

## 2021-04-13 DIAGNOSIS — C801 Malignant (primary) neoplasm, unspecified: Secondary | ICD-10-CM

## 2021-04-13 HISTORY — DX: Malignant (primary) neoplasm, unspecified: C80.1

## 2021-04-14 ENCOUNTER — Other Ambulatory Visit: Payer: Self-pay

## 2021-04-14 ENCOUNTER — Ambulatory Visit (INDEPENDENT_AMBULATORY_CARE_PROVIDER_SITE_OTHER): Payer: Medicare Other | Admitting: Emergency Medicine

## 2021-04-14 DIAGNOSIS — R911 Solitary pulmonary nodule: Secondary | ICD-10-CM

## 2021-04-14 LAB — PULMONARY FUNCTION TEST
DL/VA % pred: 114 %
DL/VA: 4.82 ml/min/mmHg/L
DLCO cor % pred: 108 %
DLCO cor: 19.7 ml/min/mmHg
DLCO unc % pred: 108 %
DLCO unc: 19.7 ml/min/mmHg
FEF 25-75 Post: 2.38 L/sec
FEF 25-75 Pre: 2.1 L/sec
FEF2575-%Change-Post: 13 %
FEF2575-%Pred-Post: 137 %
FEF2575-%Pred-Pre: 120 %
FEV1-%Change-Post: 2 %
FEV1-%Pred-Post: 112 %
FEV1-%Pred-Pre: 109 %
FEV1-Post: 2.31 L
FEV1-Pre: 2.25 L
FEV1FVC-%Change-Post: 4 %
FEV1FVC-%Pred-Pre: 104 %
FEV6-%Change-Post: -1 %
FEV6-%Pred-Post: 107 %
FEV6-%Pred-Pre: 109 %
FEV6-Post: 2.79 L
FEV6-Pre: 2.83 L
FEV6FVC-%Change-Post: 0 %
FEV6FVC-%Pred-Post: 104 %
FEV6FVC-%Pred-Pre: 104 %
FVC-%Change-Post: -1 %
FVC-%Pred-Post: 102 %
FVC-%Pred-Pre: 104 %
FVC-Post: 2.8 L
FVC-Pre: 2.84 L
Post FEV1/FVC ratio: 82 %
Post FEV6/FVC ratio: 100 %
Pre FEV1/FVC ratio: 79 %
Pre FEV6/FVC Ratio: 100 %
RV % pred: 91 %
RV: 1.94 L
TLC % pred: 100 %
TLC: 4.79 L

## 2021-04-14 NOTE — Progress Notes (Signed)
PFT done today. 

## 2021-04-18 ENCOUNTER — Ambulatory Visit: Payer: Medicare Other | Admitting: Emergency Medicine

## 2021-04-18 NOTE — Telephone Encounter (Signed)
Called and spoke with patient. She stated that she is currently due for her covid booster but she wanted to know if it would be safe for her to get it done before her doppler on the 14th, PET scan on the 15th and mammogram that has not been scheduled.   I advised her that would be best for her to wait several weeks after getting the booster before she gets her mammogram. Also advised her to not get the booster before her PET scan. I explained to her the side effects and how the booster causes some patient's lymph nodes to look suspicious on scans.   She verbalized understanding and will hold off on the covid booster.   Nothing further needed at time of call.

## 2021-04-26 ENCOUNTER — Ambulatory Visit (HOSPITAL_COMMUNITY): Payer: Medicare Other

## 2021-04-26 DIAGNOSIS — M79604 Pain in right leg: Secondary | ICD-10-CM | POA: Diagnosis not present

## 2021-04-26 DIAGNOSIS — M79605 Pain in left leg: Secondary | ICD-10-CM | POA: Diagnosis not present

## 2021-04-27 ENCOUNTER — Ambulatory Visit (HOSPITAL_COMMUNITY)
Admission: RE | Admit: 2021-04-27 | Discharge: 2021-04-27 | Disposition: A | Discharge status: Home / Self Care | Payer: Medicare Other | Source: Ambulatory Visit | Attending: Emergency Medicine | Admitting: Emergency Medicine

## 2021-04-27 ENCOUNTER — Other Ambulatory Visit: Payer: Self-pay

## 2021-04-27 DIAGNOSIS — R911 Solitary pulmonary nodule: Secondary | ICD-10-CM | POA: Diagnosis not present

## 2021-04-27 DIAGNOSIS — I700 Atherosclerosis of aorta: Secondary | ICD-10-CM | POA: Insufficient documentation

## 2021-04-27 DIAGNOSIS — I2510 Atherosclerotic heart disease of native coronary artery without angina pectoris: Secondary | ICD-10-CM | POA: Insufficient documentation

## 2021-04-27 DIAGNOSIS — R918 Other nonspecific abnormal finding of lung field: Secondary | ICD-10-CM | POA: Diagnosis not present

## 2021-04-27 DIAGNOSIS — N2889 Other specified disorders of kidney and ureter: Secondary | ICD-10-CM | POA: Diagnosis not present

## 2021-04-27 LAB — GLUCOSE, CAPILLARY: Glucose-Capillary: 128 mg/dL — ABNORMAL HIGH (ref 70–99)

## 2021-05-01 DIAGNOSIS — I8312 Varicose veins of left lower extremity with inflammation: Secondary | ICD-10-CM | POA: Diagnosis not present

## 2021-05-01 DIAGNOSIS — I83893 Varicose veins of bilateral lower extremities with other complications: Secondary | ICD-10-CM | POA: Diagnosis not present

## 2021-05-01 DIAGNOSIS — I8311 Varicose veins of right lower extremity with inflammation: Secondary | ICD-10-CM | POA: Diagnosis not present

## 2021-05-02 ENCOUNTER — Encounter (INDEPENDENT_AMBULATORY_CARE_PROVIDER_SITE_OTHER): Payer: Medicare Other | Admitting: Ophthalmology

## 2021-05-02 DIAGNOSIS — F419 Anxiety disorder, unspecified: Secondary | ICD-10-CM | POA: Diagnosis not present

## 2021-05-02 DIAGNOSIS — E1165 Type 2 diabetes mellitus with hyperglycemia: Secondary | ICD-10-CM | POA: Diagnosis not present

## 2021-05-02 DIAGNOSIS — M810 Age-related osteoporosis without current pathological fracture: Secondary | ICD-10-CM | POA: Diagnosis not present

## 2021-05-02 DIAGNOSIS — E785 Hyperlipidemia, unspecified: Secondary | ICD-10-CM | POA: Diagnosis not present

## 2021-05-02 DIAGNOSIS — E538 Deficiency of other specified B group vitamins: Secondary | ICD-10-CM | POA: Diagnosis not present

## 2021-05-02 DIAGNOSIS — E1169 Type 2 diabetes mellitus with other specified complication: Secondary | ICD-10-CM | POA: Diagnosis not present

## 2021-05-02 DIAGNOSIS — I10 Essential (primary) hypertension: Secondary | ICD-10-CM | POA: Diagnosis not present

## 2021-05-02 DIAGNOSIS — Z0000 Encounter for general adult medical examination without abnormal findings: Secondary | ICD-10-CM | POA: Diagnosis not present

## 2021-05-02 DIAGNOSIS — E2839 Other primary ovarian failure: Secondary | ICD-10-CM | POA: Diagnosis not present

## 2021-05-02 DIAGNOSIS — Z5181 Encounter for therapeutic drug level monitoring: Secondary | ICD-10-CM | POA: Diagnosis not present

## 2021-05-02 DIAGNOSIS — E0590 Thyrotoxicosis, unspecified without thyrotoxic crisis or storm: Secondary | ICD-10-CM | POA: Diagnosis not present

## 2021-05-04 ENCOUNTER — Other Ambulatory Visit: Payer: Self-pay | Admitting: Family Medicine

## 2021-05-04 DIAGNOSIS — E2839 Other primary ovarian failure: Secondary | ICD-10-CM

## 2021-05-10 ENCOUNTER — Ambulatory Visit (INDEPENDENT_AMBULATORY_CARE_PROVIDER_SITE_OTHER): Payer: Medicare Other | Admitting: Emergency Medicine

## 2021-05-10 ENCOUNTER — Encounter: Payer: Self-pay | Admitting: Emergency Medicine

## 2021-05-10 ENCOUNTER — Other Ambulatory Visit: Payer: Self-pay

## 2021-05-10 DIAGNOSIS — R911 Solitary pulmonary nodule: Secondary | ICD-10-CM

## 2021-05-10 NOTE — Addendum Note (Signed)
Addended by: Gavin Potters R on: 05/10/2021 05:31 PM   Modules accepted: Orders

## 2021-05-10 NOTE — Progress Notes (Signed)
Subjective:    Patient ID: Jenny Cardenas, female    DOB: 1951/06/21, 70 y.o.   MRN: [id number]  HPI 70 year old woman with history of minimal tobacco use (2 pack years), obesity, diabetes, GERD/PUD, hypertension, DDD with back pain.  She is here to evaluate an abnormal CT scan of the chest. She had a CT chest 07/12/2019 after a traumatic fall that identified a right upper lobe groundglass opacity.  This was followed with serial imaging 10/26/2019, measured 12 x 8 mm, as well as scattered smaller nodules most recent scans are 11/21/2020 where it had increased in size to 1.5 x 1.1 cm.  The other nodules are unchanged.  Most recent scan 03/29/2021 as below. She denies any SOB, good functional capacity. No cough. No wheeze.   CT chest 03/29/2021 reviewed by me, shows continued increase in size in a 1.6 x 1.0 cm posterior right upper lobe groundglass pulmonary nodule.  There are stable right middle lobe and other scattered nodules consistent with either small granulomas or cement emboli.  ROV 05/10/21 --follow-up visit 70 year old woman with history minimal tobacco use, obesity, diabetes, GERD, hypertension.  We have been following slowly enlarging right upper lobe posterior groundglass pulmonary nodule, 1.6 x 1.0 cm on her most recent scan.  We performed pulmonary function testing and a PET scan and she returns to review these today.  PET scan performed on 04/27/2021 reviewed by me, shows no hypermetabolic activity in the right upper lobe pulmonary nodule.  His increased slightly in density.  There is no evidence of mediastinal adenopathy or metastatic disease.  Pulmonary function testing 04/14/2021 reviewed by me, shows grossly normal airflows without a bronchodilator response, normal lung volumes, normal diffusion capacity.   Review of Systems As per HPI  Past Medical History:  Diagnosis Date   Anxiety    Arthritis    Carpal tunnel syndrome    Cataract    Depression    Diabetes mellitus without  complication ([place])    Type 2   Fall April 10, 2015   GERD (gastroesophageal reflux disease)    Goiter    History of shingles    On abdomen, left ring finger, and left leg; Pt takes Valtrex   Hyperlipidemia    Hypertension    Obesity    PTSD (post-traumatic stress disorder)    Shortness of breath dyspnea    pt stated its related to the back problems she has   Thyroid disease    Ulcer      Family History  Problem Relation Age of Onset   COPD Mother    Depression Mother    Hypertension Mother    Hyperlipidemia Mother    Breast cancer Mother 23   Alzheimer's disease Father    Heart disease Father    Breast cancer Sister 77   Colon cancer Neg Hx      Social History   Socioeconomic History   Marital status: Widowed    Spouse name: Not on file   Number of children: Not on file   Years of education: Not on file   Highest education level: Not on file  Occupational History   Not on file  Tobacco Use   Smoking status: Former    Packs/day: 0.50    Years: 4.00    Pack years: 2.00    Types: Cigarettes    Quit date: 06/28/2016    Years since quitting: 4.8   Smokeless tobacco: Never  Vaping Use   Vaping Use: Never used  Substance and Sexual Activity   Alcohol use: Yes    Comment: rare glass of wine (holidays only)   Drug use: No   Sexual activity: Not Currently  Other Topics Concern   Not on file  Social History Narrative   Not on file   Social Determinants of Health   Financial Resource Strain: Not on file  Food Insecurity: Not on file  Transportation Needs: Not on file  Physical Activity: Not on file  Stress: Not on file  Social Connections: Not on file  Intimate Partner Violence: Not on file    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   10   ALPRAZolam (XANAX) 0.5 MG tablet Take 1 tablet (0.5 mg total) by mouth 3 (three) times daily as needed for anxiety. 10 tablet 0   amphetamine-dextroamphetamine (ADDERALL) 10 MG tablet Take 1 tablet (10 mg total) by mouth every morning. 10 tablet 0   buPROPion (WELLBUTRIN XL) 150 MG 24 hr tablet Take 150 mg by mouth daily.     Cyanocobalamin (VITAMIN B 12) 500 MCG TABS Take 500 mg by mouth once a week.     cycloSPORINE (RESTASIS) 0.05 % ophthalmic emulsion Place 1 drop into both eyes 2 (two) times daily.     furosemide (LASIX) 20 MG tablet Take 20 mg by mouth daily.     Ibuprofen-diphenhydrAMINE Cit 200-38 MG TABS Take 1 tablet by mouth at bedtime as needed (sleep).     ketoconazole (NIZORAL) 2 % cream Apply 1 application topically daily as needed for irritation.     levothyroxine (SYNTHROID, LEVOTHROID) 25 MCG tablet Take 25 mcg by mouth daily before breakfast.     metFORMIN (GLUCOPHAGE-XR) 500 MG 24 hr tablet Take 500 mg by mouth every evening.     mupirocin ointment (BACTROBAN) 2 % Apply 1 application topically daily as needed (for mask dermatitis).     oxyCODONE-acetaminophen (PERCOCET/ROXICET) 5-325 MG tablet Take 1-2 tablets by mouth every 6 (six) hours as needed for moderate pain or severe pain. 10 tablet 0   pantoprazole (PROTONIX) 40 MG tablet Take 40 mg by mouth daily.     pravastatin (PRAVACHOL) 10 MG tablet Take 10 mg by mouth daily.     sertraline (ZOLOFT) 100 MG tablet Take 150 mg by mouth daily. Take 150 mg (1 whole tablet and 1/2 tablet) by mouth once daily.     tiZANidine (ZANAFLEX) 4 MG tablet Take 1 tablet (4 mg total) by mouth at bedtime. 10 tablet 0   triamcinolone cream (KENALOG) 0.1 % Apply 1 application topically.     valACYclovir (VALTREX) 500 MG tablet Take 1 tablet  (500 mg total) by mouth daily. 30 tablet 11   Vitamin D, Ergocalciferol, (DRISDOL) 1.25 MG (50000 UT) CAPS capsule 1 CAPSULE ONCE A WEEK X 8 WEEKS     No facility-administered medications prior to visit.         Objective:   Physical Exam Vitals:   05/10/21 1648  BP: 111/70  Pulse: 89  Temp: 97.6 F (36.4  C)  TempSrc: Oral  SpO2: 100%  Weight: 167 lb 9.6 oz (76 kg)  Height: 5\' 5"  (1.651 m)   Gen: Pleasant,overwt woman, in no distress,  normal affect  ENT: No lesions,  mouth clear,  oropharynx clear, no postnasal drip  Neck: No JVD, no stridor  Lungs: No use of accessory muscles, no crackles or wheezing on normal respiration, no wheeze on forced expiration  Cardiovascular: RRR, heart sounds normal, no murmur or gallops, trace peripheral edema, compression hose in place  Musculoskeletal: No deformities, no cyanosis or clubbing  Neuro: alert, awake, non focal  Skin: Warm, no lesions or rash      Assessment & Plan:    Pulmonary nodule 1 cm or greater in diameter I reviewed her PET scan and pulmonary function testing with her today.  There is no evidence of metastatic disease, the groundglass pulmonary nodule does not show any significant hypermetabolism.  All consistent with a stage I slow-growing adenocarcinoma.  Her pulmonary function testing is acceptable for referral for possible resection.  I will send her to see TCTS to discuss.  I would be happy to do a combination procedure, perform navigation and dye marking if it would be helpful.  Time spent 30 minutes  Baltazar Apo, MD, PhD 05/10/2021, 5:24 PM [facility] [phone] or if no answer before 7:00PM call [phone] For any issues after 7:00PM please call eLink [phone]

## 2021-05-10 NOTE — Assessment & Plan Note (Signed)
I reviewed her PET scan and pulmonary function testing with her today.  There is no evidence of metastatic disease, the groundglass pulmonary nodule does not show any significant hypermetabolism.  All consistent with a stage I slow-growing adenocarcinoma.  Her pulmonary function testing is acceptable for referral for possible resection.  I will send her to see TCTS to discuss.  I would be happy to do a combination procedure, perform navigation and dye marking if it would be helpful.

## 2021-05-10 NOTE — Patient Instructions (Signed)
We will refer you to see thoracic surgery to discuss primary resection of your right upper lobe pulmonary nodule. Follow-up with Dr. Lamonte Sakai in 1 month.  Call with any questions.

## 2021-05-11 DIAGNOSIS — Z23 Encounter for immunization: Secondary | ICD-10-CM | POA: Diagnosis not present

## 2021-05-11 NOTE — Addendum Note (Signed)
Addended by: Gavin Potters R on: 05/11/2021 03:11 PM   Modules accepted: Orders

## 2021-05-15 NOTE — Progress Notes (Unsigned)
[address]       [place] [address]             [phone]                    Jenny Cardenas [place] Medical Record #[id number] Date of Birth: [birth year]  Referring: Maurice Small, MD Primary Care: Maurice Small, MD Primary Cardiologist: None  Chief Complaint:   No chief complaint on file.   History of Present Illness:    Jenny Cardenas 70 y.o. female referred by Dr. Lamonte Sakai for surgical evaluation of a right upper lobe groundglass opacity that is slowly been growing serial imaging.  She underwent a PET/CT which did not show any significant uptake, but given the size and characteristics there is concern that this could be a primary lung cancer.  The nodule was originally identified in 2020, and at that time measured 1.2 cm.  On most recent imaging it now measures 1.6 cm.    Smoking Hx: ***   Zubrod Score: At the time of surgery this patient's most appropriate activity status/level should be described as: []     0    Normal activity, no symptoms []     1    Restricted in physical strenuous activity but ambulatory, able to do out light work []     2    Ambulatory and capable of self care, unable to do work activities, up and about               >50 % of waking hours                              []     3    Only limited self care, in bed greater than 50% of waking hours []     4    Completely disabled, no self care, confined to bed or chair []     5    Moribund   Past Medical History:  Diagnosis Date   Anxiety    Arthritis    Carpal tunnel syndrome    Cataract    Depression    Diabetes mellitus without complication ([place])    Type 2   Fall April 10, 2015   GERD (gastroesophageal reflux disease)    Goiter    History of shingles    On abdomen, left ring finger, and left leg; Pt takes Valtrex   Hyperlipidemia    Hypertension    Obesity    PTSD (post-traumatic stress disorder)    Shortness of breath dyspnea    pt stated its related to the back problems she  has   Thyroid disease    Ulcer     Past Surgical History:  Procedure Laterality Date   ABDOMINAL HYSTERECTOMY  1996   CATARACT EXTRACTION, BILATERAL     [name]   COLONOSCOPY W/ POLYPECTOMY     KYPHOPLASTY N/A 06/02/2015   Procedure: KYPHOPLASTY;  Surgeon: Phylliss Bob, MD;  Location: [facility];  Service: Orthopedics;  Laterality: N/A;  Thoracic 3, 8, 10 kyphoplasty    Family History  Problem Relation Age of Onset   COPD Mother    Depression Mother    Hypertension Mother    Hyperlipidemia Mother    Breast cancer Mother 3   Alzheimer's disease Father    Heart disease Father    Breast cancer Sister 36  Colon cancer Neg Hx      Social History   Tobacco Use  Smoking Status Former   Packs/day: 0.50   Years: 4.00   Pack years: 2.00   Types: Cigarettes   Quit date: 06/28/2016   Years since quitting: 4.8  Smokeless Tobacco Never    Social History   Substance and Sexual Activity  Alcohol Use Yes   Comment: rare glass of wine (holidays only)     Allergies  Allergen Reactions   Other     Etholine Oxide: causes itching and hives, and temporary blindness    Current Outpatient Medications  Medication Sig Dispense Refill   albuterol (PROVENTIL) (2.5 MG/3ML) 0.083% nebulizer solution Take 2.5 mg by nebulization every 4 (four) hours as needed for wheezing or shortness of breath.   10   ALPRAZolam (XANAX) 0.5 MG tablet Take 1 tablet (0.5 mg total) by mouth 3 (three) times daily as needed for anxiety. 10 tablet 0   amphetamine-dextroamphetamine (ADDERALL) 10 MG tablet Take 1 tablet (10 mg total) by mouth every morning. 10 tablet 0   buPROPion (WELLBUTRIN XL) 150 MG 24 hr tablet Take 150 mg by mouth daily.     Cyanocobalamin (VITAMIN B 12) 500 MCG TABS Take 500 mg by mouth once a week.     cycloSPORINE (RESTASIS) 0.05 % ophthalmic emulsion Place 1 drop into both eyes 2 (two) times daily.     furosemide (LASIX) 20 MG tablet Take 20 mg by mouth  daily.     Ibuprofen-diphenhydrAMINE Cit 200-38 MG TABS Take 1 tablet by mouth at bedtime as needed (sleep).     ketoconazole (NIZORAL) 2 % cream Apply 1 application topically daily as needed for irritation.     levothyroxine (SYNTHROID, LEVOTHROID) 25 MCG tablet Take 25 mcg by mouth daily before breakfast.     metFORMIN (GLUCOPHAGE-XR) 500 MG 24 hr tablet Take 500 mg by mouth every evening.     mupirocin ointment (BACTROBAN) 2 % Apply 1 application topically daily as needed (for mask dermatitis).     oxyCODONE-acetaminophen (PERCOCET/ROXICET) 5-325 MG tablet Take 1-2 tablets by mouth every 6 (six) hours as needed for moderate pain or severe pain. 10 tablet 0   pantoprazole (PROTONIX) 40 MG tablet Take 40 mg by mouth daily.     pravastatin (PRAVACHOL) 10 MG tablet Take 10 mg by mouth daily.     sertraline (ZOLOFT) 100 MG tablet Take 150 mg by mouth daily. Take 150 mg (1 whole tablet and 1/2 tablet) by mouth once daily.     tiZANidine (ZANAFLEX) 4 MG tablet Take 1 tablet (4 mg total) by mouth at bedtime. 10 tablet 0   triamcinolone cream (KENALOG) 0.1 % Apply 1 application topically.     valACYclovir (VALTREX) 500 MG tablet Take 1 tablet (500 mg total) by mouth daily. 30 tablet 11   Vitamin D, Ergocalciferol, (DRISDOL) 1.25 MG (50000 UT) CAPS capsule 1 CAPSULE ONCE A WEEK X 8 WEEKS     No current facility-administered medications for this visit.    ROS   PHYSICAL EXAMINATION: There were no vitals taken for this visit. Physical Exam  Diagnostic Studies & Laboratory data:     Recent Radiology Findings:   NM PET Image Initial (PI) Skull Base To Thigh  Result Date: 04/27/2021 CLINICAL DATA:  12 mm RIGHT upper lobe pulmonary nodule treatment strategy for right upper lobe nodule. NUCLEAR MEDICINE PET SKULL BASE TO THIGH TECHNIQUE: 8.4 mCi F-18 FDG was injected intravenously. Full-ring PET imaging was  performed from the skull base to thigh after the radiotracer. CT data was obtained and used  for attenuation correction and anatomic localization. Fasting blood glucose: 128 mg/dl COMPARISON:  CT 03/29/21 FINDINGS: Mediastinal blood pool activity: SUV max 1.69 Liver activity: SUV max NA NECK: No hypermetabolic lymph nodes in the neck. Incidental CT findings: none CHEST: 12 mm ground-glass RIGHT upper lobe pulmonary nodule has no metabolic activity (image 92/[id number] 4). Nodule appears slightly more dense than CT 10/26/2019. No hypermetabolic mediastinal lymph nodes. Incidental CT findings: Coronary artery calcification and aortic atherosclerotic calcification. ABDOMEN/PELVIS: No abnormal hypermetabolic activity within the liver, pancreas, adrenal glands, or spleen. No hypermetabolic lymph nodes in the abdomen or pelvis. Incidental CT findings: Calcification in the LEFT renal pelvis SKELETON: No focal hypermetabolic activity to suggest skeletal metastasis. Incidental CT findings: none IMPRESSION: 1. No metabolic activity associated ground-glass nodule in the RIGHT upper lobe. Ground-glass lesions are often non metabolic due to low cellularity. Recommend continued CT surveillance or tissue sampling. 2. No evidence of mediastinal adenopathy or metastatic disease. 3. Coronary artery calcification and Aortic Atherosclerosis (ICD10-I70.0). Electronically Signed   By: Suzy Bouchard M.D.   On: 04/27/2021 14:14       I have independently reviewed the above radiology studies  and reviewed the findings with the patient.   Recent Lab Findings: Lab Results  Component Value Date   WBC 8.3 07/13/2019   HGB 10.3 (L) 07/13/2019   HCT 32.8 (L) 07/13/2019   PLT 219 07/13/2019   GLUCOSE 144 (H) 07/14/2019   CHOL 220 (H) 07/21/2012   TRIG 350.0 (H) 07/21/2012   HDL 27.70 (L) 07/21/2012   LDLDIRECT 139.6 07/21/2012   LDLCALC 59 04/17/2011   ALT 16 07/12/2019   AST 24 07/12/2019   NA 141 07/14/2019   K 4.1 07/14/2019   CL 108 07/14/2019   CREATININE 0.93 07/14/2019   BUN 13 07/14/2019   CO2 26 07/14/2019    TSH 2.978 06/15/2018   INR 1.03 06/01/2015   HGBA1C 6.5 (H) 10/02/2018     PFTs: - FVC: 104% - FEV1: 109% -DLCO: 108%  Problem List: ***  Assessment / Plan:   70 year old female with a right upper lobe groundglass opacity.  I personally reviewed the cross-sectional imaging which included the CT scan and the PET/CT.  There is no significant uptake on PET/CT.  The groundglass opacity is quite deep in the lung thus obtaining a tissue sample via navigational bronchoscopy prior to surgery will be ideal.  I will speak with Dr. Lamonte Sakai to schedule a time for this patient to undergo a combination procedure.  If the biopsy results are positive then she will undergo a right upper lobectomy performed robotically.  If the biopsy results are indeterminate then we will plan for a generous wedge resection and possible lobectomy.     I  spent {CHL ONC TIME VISIT - AUQJF:[id number]} with  the patient face to face in counseling and coordination of care.    Lajuana Matte 05/15/2021 12:42 PM

## 2021-05-17 ENCOUNTER — Encounter: Payer: Medicare Other | Admitting: Thoracic Surgery (Cardiothoracic Vascular Surgery)

## 2021-05-17 DIAGNOSIS — R911 Solitary pulmonary nodule: Secondary | ICD-10-CM

## 2021-05-19 NOTE — Progress Notes (Signed)
[facility] Note  05/23/2021     CHIEF COMPLAINT Patient presents for Retina Follow Up  HISTORY OF PRESENT ILLNESS: Jenny Cardenas is a 70 y.o. female who presents to the clinic today for:   HPI     Retina Follow Up   Patient presents with  Dry AMD.  In both eyes.  Severity is moderate.  Duration of 6 months.  Since onset it is stable.  I, the attending physician,  performed the HPI with the patient and updated documentation appropriately.        Comments   Patient states got new glasses Rx, changed 9-10 months ago. Happy with vision with new glasses. On AREDS 2. No new distortion in vision.       Last edited by Bernarda Caffey, MD on 05/25/2021  8:51 PM.    Pt states she has gotten new glasses with bifocals bc she did not like the progressive lenses   Referring physician: Maurice Small, MD [address] [place],  [place] [address]  HISTORICAL INFORMATION:   Selected notes from the MEDICAL RECORD NUMBER Referred by Dr. Marin Comment for concern of macular degeneration OU   CURRENT MEDICATIONS: Current Outpatient Medications (Ophthalmic Drugs)  Medication Sig   cycloSPORINE (RESTASIS) 0.05 % ophthalmic emulsion Place 1 drop into both eyes 2 (two) times daily.   No current facility-administered medications for this visit. (Ophthalmic Drugs)   Current Outpatient Medications (Other)  Medication Sig   albuterol (PROVENTIL) (2.5 MG/3ML) 0.083% nebulizer solution Take 2.5 mg by nebulization every 4 (four) hours as needed for wheezing or shortness of breath.    ALPRAZolam (XANAX) 0.5 MG tablet Take 1 tablet (0.5 mg total) by mouth 3 (three) times daily as needed for anxiety.   amphetamine-dextroamphetamine (ADDERALL) 10 MG tablet Take 1 tablet (10 mg total) by mouth every morning.   buPROPion (WELLBUTRIN XL) 150 MG 24 hr tablet Take 150 mg by mouth daily.   Cyanocobalamin (VITAMIN B 12) 500 MCG TABS Take 500 mg by mouth once a week.    furosemide (LASIX) 20 MG tablet Take 20 mg by mouth daily.   Ibuprofen-diphenhydrAMINE Cit 200-38 MG TABS Take 1 tablet by mouth at bedtime as needed (sleep).   ketoconazole (NIZORAL) 2 % cream Apply 1 application topically daily as needed for irritation.   levothyroxine (SYNTHROID, LEVOTHROID) 25 MCG tablet Take 25 mcg by mouth daily before breakfast.   metFORMIN (GLUCOPHAGE-XR) 500 MG 24 hr tablet Take 500 mg by mouth every evening.   mupirocin ointment (BACTROBAN) 2 % Apply 1 application topically daily as needed (for mask dermatitis).   oxyCODONE-acetaminophen (PERCOCET/ROXICET) 5-325 MG tablet Take 1-2 tablets by mouth every 6 (six) hours as needed for moderate pain or severe pain.   pantoprazole (PROTONIX) 40 MG tablet Take 40 mg by mouth daily.   pravastatin (PRAVACHOL) 10 MG tablet Take 10 mg by mouth daily.   sertraline (ZOLOFT) 100 MG tablet Take 150 mg by mouth daily. Take 150 mg (1 whole tablet and 1/2 tablet) by mouth once daily.   tiZANidine (ZANAFLEX) 4 MG tablet Take 1 tablet (4 mg total) by mouth at bedtime.   triamcinolone cream (KENALOG) 0.1 % Apply 1 application topically.   valACYclovir (VALTREX) 500 MG tablet Take 1 tablet (500 mg total) by mouth daily.   Vitamin D, Ergocalciferol, (DRISDOL) 1.25 MG (50000 UT) CAPS capsule 1 CAPSULE ONCE A WEEK X 8 WEEKS   No current facility-administered medications for this  visit. (Other)   REVIEW OF SYSTEMS: ROS   Positive for: Gastrointestinal, Musculoskeletal, Endocrine, Eyes, Psychiatric Negative for: Constitutional, Neurological, Skin, Genitourinary, HENT, Cardiovascular, Respiratory, Allergic/Imm, Heme/Lymph Last edited by Roselee Nova D, COT on 05/23/2021 12:54 PM.    ALLERGIES Allergies  Allergen Reactions   Other     Etholine Oxide: causes itching and hives, and temporary blindness   PAST MEDICAL HISTORY Past Medical History:  Diagnosis Date   Anxiety    Arthritis    Carpal tunnel syndrome    Cataract     Depression    Diabetes mellitus without complication ([place])    Type 2   Fall April 10, 2015   GERD (gastroesophageal reflux disease)    Goiter    History of shingles    On abdomen, left ring finger, and left leg; Pt takes Valtrex   Hyperlipidemia    Hypertension    Obesity    PTSD (post-traumatic stress disorder)    Shortness of breath dyspnea    pt stated its related to the back problems she has   Thyroid disease    Ulcer    Past Surgical History:  Procedure Laterality Date   ABDOMINAL HYSTERECTOMY  1996   CATARACT EXTRACTION, BILATERAL     [place]   COLONOSCOPY W/ POLYPECTOMY     KYPHOPLASTY N/A 06/02/2015   Procedure: KYPHOPLASTY;  Surgeon: Phylliss Bob, MD;  Location: [facility];  Service: Orthopedics;  Laterality: N/A;  Thoracic 3, 8, 10 kyphoplasty   FAMILY HISTORY Family History  Problem Relation Age of Onset   COPD Mother    Depression Mother    Hypertension Mother    Hyperlipidemia Mother    Breast cancer Mother 43   Alzheimer's disease Father    Heart disease Father    Breast cancer Sister 44   Colon cancer Neg Hx    SOCIAL HISTORY Social History   Tobacco Use   Smoking status: Former    Packs/day: 0.50    Years: 4.00    Pack years: 2.00    Types: Cigarettes    Quit date: 06/28/2016    Years since quitting: 4.9   Smokeless tobacco: Never  Vaping Use   Vaping Use: Never used  Substance Use Topics   Alcohol use: Yes    Comment: rare glass of wine (holidays only)   Drug use: No       OPHTHALMIC EXAM: Base Eye Exam     Visual Acuity (Snellen - Linear)       Right Left   Dist cc 20/30 +1 20/25 -1   Dist ph cc 20/25 -1 20/25 +2         Tonometry (Tonopen, 1:07 PM)       Right Left   Pressure 16 19         Pupils       Dark Light Shape React APD   Right 3 2 Round Brisk None   Left 3 2 Round Brisk None         Visual Fields (Counting fingers)       Left Right    Full Full         Extraocular  Movement       Right Left    Full, Ortho Full, Ortho         Neuro/Psych     Oriented x3: Yes   Mood/Affect: Normal         Dilation     Both  eyes: 1.0% Mydriacyl, 2.5% Phenylephrine @ 1:07 PM           Slit Lamp and Fundus Exam     Slit Lamp Exam       Right Left   Lids/Lashes Mild Dermatochalasis - upper lid, Meibomian gland dysfunction Mild Dermatochalasis - upper lid, Meibomian gland dysfunction   Conjunctiva/Sclera prominent epi scleral vessels temporally prominent epi scleral vessels temporally   Cornea Mild arcus, trace Punctate epithelial erosions, well healed cataract wounds, superior / inferior LRIs, barely visible lasik flap Mild arcus, trace Punctate epithelial erosions, well healed cataract wounds, superior / inferior LRIs, well healed lasik flap, mild corneal haze / Salzmann's nodule at 0400   Anterior Chamber Deep and quiet Deep and quiet   Iris Round and dilated Round and dilated   Lens PC IOL in good position with open PC PC IOL in good position with open PC   Vitreous Vitreous syneresis, Posterior vitreous detachment, mild vitreous condensations Vitreous syneresis, Posterior vitreous detachment, vitreous condensations         Fundus Exam       Right Left   Disc Pink and Sharp mild Pallor, Sharp rim   C/D Ratio 0.2 0.3   Macula Flat, Good foveal reflex, rare Drusen, mild RPE mottling Flat, Good foveal reflex, +Drusen, mild RPE mottling   Vessels mild attenuation mild attenuation   Periphery Attached, mild reticular degeneration Attached, mild reticular degeneration, No heme            Refraction     Wearing Rx       Sphere Cylinder Axis Add   Right +0.50 +0.75 085 +3.75   Left [place] +0.75 085 +3.75           IMAGING AND PROCEDURES  Imaging and Procedures for 05/23/2021  OCT, Retina - OU - Both Eyes       Right Eye Quality was good. Central Foveal Thickness: 291. Progression has been stable. Findings include normal foveal  contour, no IRF, no SRF, retinal drusen (Rare drusen).   Left Eye Quality was good. Central Foveal Thickness: 289. Progression has been stable. Findings include normal foveal contour, no IRF, no SRF, retinal drusen (Rare, focal drusen).   Notes *Images captured and stored on drive  Diagnosis / Impression:  NFP, no IRF/SRF OU rare focal drusen / early non-exu ARMD OU  Clinical management:  See below  Abbreviations: NFP - Normal foveal profile. CME - cystoid macular edema. PED - pigment epithelial detachment. IRF - intraretinal fluid. SRF - subretinal fluid. EZ - ellipsoid zone. ERM - epiretinal membrane. ORA - outer retinal atrophy. ORT - outer retinal tubulation. SRHM - subretinal hyper-reflective material. IRHM - intraretinal hyper-reflective material            ASSESSMENT/PLAN:   ICD-10-CM   1. Early dry stage nonexudative age-related macular degeneration of both eyes  H35.3131     2. Retinal edema  H35.81 OCT, Retina - OU - Both Eyes    3. Diabetes mellitus type 2 without retinopathy ([place])  E11.9     4. Pseudophakia, both eyes  Z96.1     5. S/P LASIK (laser assisted in situ keratomileusis) of both eyes  Z98.890     6. Dry eyes  H04.123     1,2. Age related macular degeneration, non-exudative, OU  - exam and OCT with rare focal drusen OU -- early stage, stable  - The incidence, anatomy, and pathology of dry AMD, risk of progression, and the AREDS and  AREDS 2 studies including smoking risks discussed with patient.  - Recommend amsler grid monitoring  - f/u 6 months, sooner prn -- DFE, OCT  3. Diabetes mellitus, type 2 without retinopathy - The incidence, risk factors for progression, natural history and treatment options for diabetic retinopathy  were discussed with patient.   - The need for close monitoring of blood glucose, blood pressure, and serum lipids, avoiding cigarette or any type of tobacco, and the need for long term follow up was also discussed with  patient. - f/u in 1 year, sooner prn  4. Pseudophakia OU  - s/p CE/IOL OU (Dr. Lucita Ferrara)  - IOLs in good position, doing well  - monitor  5. History of Lasik  - stable  - mild corneal scarring OS  6. Dry eyes OU  - recommend artificial tears and lubricating ointment as needed  Ophthalmic Meds Ordered this visit:  No orders of the defined types were placed in this encounter.    Return in about 6 months (around 11/21/2021) for f/u non-exu ARMD OU, DFE, OCT.  There are no Patient Instructions on file for this visit.  Explained the diagnoses, plan, and follow up with the patient and they expressed understanding.  Patient expressed understanding of the importance of proper follow up care.   This document serves as a record of services personally performed by Gardiner Sleeper, MD, PhD. It was created on their behalf by Estill Bakes, COT an ophthalmic technician. The creation of this record is the provider's dictation and/or activities during the visit.    Electronically signed by: Estill Bakes, COT 10.7.22 @ 8:53 PM   This document serves as a record of services personally performed by Gardiner Sleeper, MD, PhD. It was created on their behalf by San Jetty. Owens Shark, OA an ophthalmic technician. The creation of this record is the provider's dictation and/or activities during the visit.    Electronically signed by: San Jetty. Owens Shark, OA @TODAY @ 8:53 PM  Gardiner Sleeper, M.D., Ph.D. Diseases & Surgery of the Retina and Vitreous [facility]  I have reviewed the above documentation for accuracy and completeness, and I agree with the above. Gardiner Sleeper, M.D., Ph.D. 05/25/21 8:56 PM  Abbreviations: M myopia (nearsighted); A astigmatism; H hyperopia (farsighted); P presbyopia; Mrx spectacle prescription;  CTL contact lenses; OD right eye; OS left eye; OU both eyes  XT exotropia; ET esotropia; PEK punctate epithelial keratitis; PEE punctate epithelial erosions; DES dry  eye syndrome; MGD meibomian gland dysfunction; ATs artificial tears; PFAT's preservative free artificial tears; [place] nuclear sclerotic cataract; PSC posterior subcapsular cataract; ERM epi-retinal membrane; PVD posterior vitreous detachment; RD retinal detachment; DM diabetes mellitus; DR diabetic retinopathy; NPDR non-proliferative diabetic retinopathy; PDR proliferative diabetic retinopathy; CSME clinically significant macular edema; DME diabetic macular edema; dbh dot blot hemorrhages; CWS cotton wool spot; POAG primary open angle glaucoma; C/D cup-to-disc ratio; HVF humphrey visual field; GVF goldmann visual field; OCT optical coherence tomography; IOP intraocular pressure; BRVO Branch retinal vein occlusion; CRVO central retinal vein occlusion; CRAO central retinal artery occlusion; BRAO branch retinal artery occlusion; RT retinal tear; SB scleral buckle; PPV pars plana vitrectomy; VH Vitreous hemorrhage; PRP panretinal laser photocoagulation; IVK intravitreal kenalog; VMT vitreomacular traction; MH Macular hole;  NVD neovascularization of the disc; NVE neovascularization elsewhere; AREDS age related eye disease study; ARMD age related macular degeneration; POAG primary open angle glaucoma; EBMD epithelial/anterior basement membrane dystrophy; ACIOL anterior chamber intraocular lens; IOL intraocular lens; PCIOL posterior chamber  intraocular lens; Phaco/IOL phacoemulsification with intraocular lens placement; [place] photorefractive keratectomy; LASIK laser assisted in situ keratomileusis; HTN hypertension; DM diabetes mellitus; COPD chronic obstructive pulmonary disease

## 2021-05-23 ENCOUNTER — Other Ambulatory Visit: Payer: Self-pay

## 2021-05-23 ENCOUNTER — Encounter (INDEPENDENT_AMBULATORY_CARE_PROVIDER_SITE_OTHER): Payer: Self-pay | Admitting: Ophthalmology

## 2021-05-23 ENCOUNTER — Ambulatory Visit (INDEPENDENT_AMBULATORY_CARE_PROVIDER_SITE_OTHER): Payer: Medicare Other | Admitting: Ophthalmology

## 2021-05-23 DIAGNOSIS — Z961 Presence of intraocular lens: Secondary | ICD-10-CM | POA: Diagnosis not present

## 2021-05-23 DIAGNOSIS — H353131 Nonexudative age-related macular degeneration, bilateral, early dry stage: Secondary | ICD-10-CM | POA: Diagnosis not present

## 2021-05-23 DIAGNOSIS — H3581 Retinal edema: Secondary | ICD-10-CM

## 2021-05-23 DIAGNOSIS — Z9889 Other specified postprocedural states: Secondary | ICD-10-CM

## 2021-05-23 DIAGNOSIS — E119 Type 2 diabetes mellitus without complications: Secondary | ICD-10-CM

## 2021-05-23 DIAGNOSIS — H04123 Dry eye syndrome of bilateral lacrimal glands: Secondary | ICD-10-CM | POA: Diagnosis not present

## 2021-05-25 ENCOUNTER — Encounter (INDEPENDENT_AMBULATORY_CARE_PROVIDER_SITE_OTHER): Payer: Self-pay | Admitting: Ophthalmology

## 2021-06-02 ENCOUNTER — Institutional Professional Consult (permissible substitution) (INDEPENDENT_AMBULATORY_CARE_PROVIDER_SITE_OTHER): Payer: Medicare Other | Admitting: Thoracic Surgery (Cardiothoracic Vascular Surgery)

## 2021-06-02 ENCOUNTER — Other Ambulatory Visit: Payer: Self-pay

## 2021-06-02 VITALS — BP 134/77 | HR 96 | Resp 20 | Ht 63.0 in | Wt 167.0 lb

## 2021-06-02 DIAGNOSIS — R911 Solitary pulmonary nodule: Secondary | ICD-10-CM | POA: Diagnosis not present

## 2021-06-02 NOTE — H&P (View-Only) (Signed)
[address]       [place] [address]             [phone]                                                   Jenny Cardenas [place] Medical Record #[id number] Date of Birth: May 19, 1951   Referring: Maurice Small, MD Primary Care: Maurice Small, MD Primary Cardiologist: None   Chief Complaint:   No chief complaint on file.     History of Present Illness:    Jenny Cardenas 70 y.o. female referred by Dr. Lamonte Sakai for surgical evaluation of a right upper lobe groundglass opacity that is slowly been growing serial imaging.  She underwent a PET/CT which did not show any significant uptake, but given the size and characteristics there is concern that this could be a primary lung cancer.  The nodule was originally identified in 2020, and at that time measured 1.2 cm.  On most recent imaging it now measures 1.6 cm.     Smoking Hx: Quits tobacco use over 10 years ago.     Zubrod Score: At the time of surgery this patient's most appropriate activity status/level should be described as:      0    Normal activity, no symptoms []     1    Restricted in physical strenuous activity but ambulatory, able to do out light work []     2    Ambulatory and capable of self care, unable to do work activities, up and about               >50 % of waking hours                              []     3    Only limited self care, in bed greater than 50% of waking hours []     4    Completely disabled, no self care, confined to bed or chair []     5    Moribund         Past Medical History:  Diagnosis Date   Anxiety     Arthritis     Carpal tunnel syndrome     Cataract     Depression     Diabetes mellitus without complication ([place])      Type 2   Fall April 10, 2015   GERD (gastroesophageal reflux disease)     Goiter     History of shingles      On abdomen, left ring finger, and left leg; Pt takes Valtrex   Hyperlipidemia     Hypertension     Obesity     PTSD (post-traumatic stress  disorder)     Shortness of breath dyspnea      pt stated its related to the back problems she has   Thyroid disease     Ulcer             Past Surgical History:  Procedure Laterality Date   ABDOMINAL HYSTERECTOMY   1996   CATARACT EXTRACTION, BILATERAL       [place]   COLONOSCOPY W/ POLYPECTOMY       KYPHOPLASTY N/A 06/02/2015  Procedure: KYPHOPLASTY;  Surgeon: Phylliss Bob, MD;  Location: [facility];  Service: Orthopedics;  Laterality: N/A;  Thoracic 3, 8, 10 kyphoplasty           Family History  Problem Relation Age of Onset   COPD Mother     Depression Mother     Hypertension Mother     Hyperlipidemia Mother     Breast cancer Mother 52   Alzheimer's disease Father     Heart disease Father     Breast cancer Sister 25   Colon cancer Neg Hx          Social History        Tobacco Use  Smoking Status Former   Packs/day: 0.50   Years: 4.00   Pack years: 2.00   Types: Cigarettes   Quit date: 06/28/2016   Years since quitting: 4.8  Smokeless Tobacco Never    Social History        Substance and Sexual Activity  Alcohol Use Yes    Comment: rare glass of wine (holidays only)             Allergies  Allergen Reactions   Other        Etholine Oxide: causes itching and hives, and temporary blindness            Current Outpatient Medications  Medication Sig Dispense Refill   albuterol (PROVENTIL) (2.5 MG/3ML) 0.083% nebulizer solution Take 2.5 mg by nebulization every 4 (four) hours as needed for wheezing or shortness of breath.    10   ALPRAZolam (XANAX) 0.5 MG tablet Take 1 tablet (0.5 mg total) by mouth 3 (three) times daily as needed for anxiety. 10 tablet 0   amphetamine-dextroamphetamine (ADDERALL) 10 MG tablet Take 1 tablet (10 mg total) by mouth every morning. 10 tablet 0   buPROPion (WELLBUTRIN XL) 150 MG 24 hr tablet Take 150 mg by mouth daily.       Cyanocobalamin (VITAMIN B 12) 500 MCG TABS Take 500 mg by mouth once a week.        cycloSPORINE (RESTASIS) 0.05 % ophthalmic emulsion Place 1 drop into both eyes 2 (two) times daily.       furosemide (LASIX) 20 MG tablet Take 20 mg by mouth daily.       Ibuprofen-diphenhydrAMINE Cit 200-38 MG TABS Take 1 tablet by mouth at bedtime as needed (sleep).       ketoconazole (NIZORAL) 2 % cream Apply 1 application topically daily as needed for irritation.       levothyroxine (SYNTHROID, LEVOTHROID) 25 MCG tablet Take 25 mcg by mouth daily before breakfast.       metFORMIN (GLUCOPHAGE-XR) 500 MG 24 hr tablet Take 500 mg by mouth every evening.       mupirocin ointment (BACTROBAN) 2 % Apply 1 application topically daily as needed (for mask dermatitis).       oxyCODONE-acetaminophen (PERCOCET/ROXICET) 5-325 MG tablet Take 1-2 tablets by mouth every 6 (six) hours as needed for moderate pain or severe pain. 10 tablet 0   pantoprazole (PROTONIX) 40 MG tablet Take 40 mg by mouth daily.       pravastatin (PRAVACHOL) 10 MG tablet Take 10 mg by mouth daily.       sertraline (ZOLOFT) 100 MG tablet Take 150 mg by mouth daily. Take 150 mg (1 whole tablet and 1/2 tablet) by mouth once daily.       tiZANidine (ZANAFLEX) 4 MG tablet Take 1 tablet (4 mg total)  by mouth at bedtime. 10 tablet 0   triamcinolone cream (KENALOG) 0.1 % Apply 1 application topically.       valACYclovir (VALTREX) 500 MG tablet Take 1 tablet (500 mg total) by mouth daily. 30 tablet 11   Vitamin D, Ergocalciferol, (DRISDOL) 1.25 MG (50000 UT) CAPS capsule 1 CAPSULE ONCE A WEEK X 8 WEEKS        No current facility-administered medications for this visit.      ROS She denies any chest pain, shortness of breath, neurologic symptoms.  Her weight has been stable.   PHYSICAL EXAMINATION: There were no vitals taken for this visit. Vitals:   06/02/21 1301  BP: 134/77  Pulse: 96  Resp: 20  SpO2: 95%   hysical Exam Alert NAD Easy work of breathing Abdomen soft nondistended No peripheral edema.  Diagnostic Studies  & Laboratory data:     Recent Radiology Findings:    Imaging Results  NM PET Image Initial (PI) Skull Base To Thigh   Result Date: 04/27/2021 CLINICAL DATA:  12 mm RIGHT upper lobe pulmonary nodule treatment strategy for right upper lobe nodule. NUCLEAR MEDICINE PET SKULL BASE TO THIGH TECHNIQUE: 8.4 mCi F-18 FDG was injected intravenously. Full-ring PET imaging was performed from the skull base to thigh after the radiotracer. CT data was obtained and used for attenuation correction and anatomic localization. Fasting blood glucose: 128 mg/dl COMPARISON:  CT 03/29/21 FINDINGS: Mediastinal blood pool activity: SUV max 1.69 Liver activity: SUV max NA NECK: No hypermetabolic lymph nodes in the neck. Incidental CT findings: none CHEST: 12 mm ground-glass RIGHT upper lobe pulmonary nodule has no metabolic activity (image [id number]). Nodule appears slightly more dense than CT 10/26/2019. No hypermetabolic mediastinal lymph nodes. Incidental CT findings: Coronary artery calcification and aortic atherosclerotic calcification. ABDOMEN/PELVIS: No abnormal hypermetabolic activity within the liver, pancreas, adrenal glands, or spleen. No hypermetabolic lymph nodes in the abdomen or pelvis. Incidental CT findings: Calcification in the LEFT renal pelvis SKELETON: No focal hypermetabolic activity to suggest skeletal metastasis. Incidental CT findings: none IMPRESSION: 1. No metabolic activity associated ground-glass nodule in the RIGHT upper lobe. Ground-glass lesions are often non metabolic due to low cellularity. Recommend continued CT surveillance or tissue sampling. 2. No evidence of mediastinal adenopathy or metastatic disease. 3. Coronary artery calcification and Aortic Atherosclerosis (ICD10-I70.0). Electronically Signed   By: Suzy Bouchard M.D.   On: 04/27/2021 14:14           I have independently reviewed the above radiology studies  and reviewed the findings with the patient.    Recent Lab  Findings: Recent Labs       Lab Results  Component Value Date    WBC 8.3 07/13/2019    HGB 10.3 (L) 07/13/2019    HCT 32.8 (L) 07/13/2019    PLT 219 07/13/2019    GLUCOSE 144 (H) 07/14/2019    CHOL 220 (H) 07/21/2012    TRIG 350.0 (H) 07/21/2012    HDL 27.70 (L) 07/21/2012    LDLDIRECT 139.6 07/21/2012    LDLCALC 59 04/17/2011    ALT 16 07/12/2019    AST 24 07/12/2019    NA 141 07/14/2019    K 4.1 07/14/2019    CL 108 07/14/2019    CREATININE 0.93 07/14/2019    BUN 13 07/14/2019    CO2 26 07/14/2019    TSH 2.978 06/15/2018    INR 1.03 06/01/2015    HGBA1C 6.5 (H) 10/02/2018  PFTs: - FVC: 104% - FEV1: 109% -DLCO: 108%    Assessment / Plan:   70 year old female with a right upper lobe groundglass opacity.  I personally reviewed the cross-sectional imaging which included the CT scan and the PET/CT.  There is no significant uptake on PET/CT.  The groundglass opacity is quite deep in the lung thus obtaining a tissue sample via navigational bronchoscopy prior to surgery will be ideal.  I will speak with Dr. Lamonte Sakai to schedule a time for this patient to undergo a combination procedure.  If the biopsy results are positive then she will undergo a right upper lobectomy performed robotically.  If the biopsy results are indeterminate then we will plan for a generous wedge resection and possible lobectomy.      I  spent 40 minutes with  the patient face to face in counseling and coordination of care.     Jenny Cardenas

## 2021-06-02 NOTE — Progress Notes (Signed)
[address]       [place] [address]             [phone]                                                   Jenny Cardenas [place] Medical Record #[id number] Date of Birth: 1951-01-28   Referring: Jenny Small, MD Primary Care: Jenny Small, MD Primary Cardiologist: None   Chief Complaint:   No chief complaint on file.     History of Present Illness:    Jenny Cardenas 70 y.o. female referred by Dr. Lamonte Cardenas for surgical evaluation of a right upper lobe groundglass opacity that is slowly been growing serial imaging.  She underwent a PET/CT which did not show any significant uptake, but given the size and characteristics there is concern that this could be a primary lung cancer.  The nodule was originally identified in 2020, and at that time measured 1.2 cm.  On most recent imaging it now measures 1.6 cm.     Smoking Hx: Quits tobacco use over 10 years ago.     Zubrod Score: At the time of surgery this patient's most appropriate activity status/level should be described as:      0    Normal activity, no symptoms []     1    Restricted in physical strenuous activity but ambulatory, able to do out light work []     2    Ambulatory and capable of self care, unable to do work activities, up and about               >50 % of waking hours                              []     3    Only limited self care, in bed greater than 50% of waking hours []     4    Completely disabled, no self care, confined to bed or chair []     5    Moribund         Past Medical History:  Diagnosis Date   Anxiety     Arthritis     Carpal tunnel syndrome     Cataract     Depression     Diabetes mellitus without complication ([place])      Type 2   Fall April 10, 2015   GERD (gastroesophageal reflux disease)     Goiter     History of shingles      On abdomen, left ring finger, and left leg; Pt takes Valtrex   Hyperlipidemia     Hypertension     Obesity     PTSD (post-traumatic stress  disorder)     Shortness of breath dyspnea      pt stated its related to the back problems she has   Thyroid disease     Ulcer             Past Surgical History:  Procedure Laterality Date   ABDOMINAL HYSTERECTOMY   1996   CATARACT EXTRACTION, BILATERAL       [place]   COLONOSCOPY W/ POLYPECTOMY       KYPHOPLASTY N/A 06/02/2015  Procedure: KYPHOPLASTY;  Surgeon: Jenny Bob, MD;  Location: [facility];  Service: Orthopedics;  Laterality: N/A;  Thoracic 3, 8, 10 kyphoplasty           Family History  Problem Relation Age of Onset   COPD Mother     Depression Mother     Hypertension Mother     Hyperlipidemia Mother     Breast cancer Mother 48   Alzheimer's disease Father     Heart disease Father     Breast cancer Sister 22   Colon cancer Neg Hx          Social History        Tobacco Use  Smoking Status Former   Packs/day: 0.50   Years: 4.00   Pack years: 2.00   Types: Cigarettes   Quit date: 06/28/2016   Years since quitting: 4.8  Smokeless Tobacco Never    Social History        Substance and Sexual Activity  Alcohol Use Yes    Comment: rare glass of wine (holidays only)             Allergies  Allergen Reactions   Other        Etholine Oxide: causes itching and hives, and temporary blindness            Current Outpatient Medications  Medication Sig Dispense Refill   albuterol (PROVENTIL) (2.5 MG/3ML) 0.083% nebulizer solution Take 2.5 mg by nebulization every 4 (four) hours as needed for wheezing or shortness of breath.    10   ALPRAZolam (XANAX) 0.5 MG tablet Take 1 tablet (0.5 mg total) by mouth 3 (three) times daily as needed for anxiety. 10 tablet 0   amphetamine-dextroamphetamine (ADDERALL) 10 MG tablet Take 1 tablet (10 mg total) by mouth every morning. 10 tablet 0   buPROPion (WELLBUTRIN XL) 150 MG 24 hr tablet Take 150 mg by mouth daily.       Cyanocobalamin (VITAMIN B 12) 500 MCG TABS Take 500 mg by mouth once a week.        cycloSPORINE (RESTASIS) 0.05 % ophthalmic emulsion Place 1 drop into both eyes 2 (two) times daily.       furosemide (LASIX) 20 MG tablet Take 20 mg by mouth daily.       Ibuprofen-diphenhydrAMINE Cit 200-38 MG TABS Take 1 tablet by mouth at bedtime as needed (sleep).       ketoconazole (NIZORAL) 2 % cream Apply 1 application topically daily as needed for irritation.       levothyroxine (SYNTHROID, LEVOTHROID) 25 MCG tablet Take 25 mcg by mouth daily before breakfast.       metFORMIN (GLUCOPHAGE-XR) 500 MG 24 hr tablet Take 500 mg by mouth every evening.       mupirocin ointment (BACTROBAN) 2 % Apply 1 application topically daily as needed (for mask dermatitis).       oxyCODONE-acetaminophen (PERCOCET/ROXICET) 5-325 MG tablet Take 1-2 tablets by mouth every 6 (six) hours as needed for moderate pain or severe pain. 10 tablet 0   pantoprazole (PROTONIX) 40 MG tablet Take 40 mg by mouth daily.       pravastatin (PRAVACHOL) 10 MG tablet Take 10 mg by mouth daily.       sertraline (ZOLOFT) 100 MG tablet Take 150 mg by mouth daily. Take 150 mg (1 whole tablet and 1/2 tablet) by mouth once daily.       tiZANidine (ZANAFLEX) 4 MG tablet Take 1 tablet (4 mg total)  by mouth at bedtime. 10 tablet 0   triamcinolone cream (KENALOG) 0.1 % Apply 1 application topically.       valACYclovir (VALTREX) 500 MG tablet Take 1 tablet (500 mg total) by mouth daily. 30 tablet 11   Vitamin D, Ergocalciferol, (DRISDOL) 1.25 MG (50000 UT) CAPS capsule 1 CAPSULE ONCE A WEEK X 8 WEEKS        No current facility-administered medications for this visit.      ROS She denies any chest pain, shortness of breath, neurologic symptoms.  Her weight has been stable.   PHYSICAL EXAMINATION: There were no vitals taken for this visit. Vitals:   06/02/21 1301  BP: 134/77  Pulse: 96  Resp: 20  SpO2: 95%   hysical Exam Alert NAD Easy work of breathing Abdomen soft nondistended No peripheral edema.  Diagnostic Studies  & Laboratory data:     Recent Radiology Findings:    Imaging Results  NM PET Image Initial (PI) Skull Base To Thigh   Result Date: 04/27/2021 CLINICAL DATA:  12 mm RIGHT upper lobe pulmonary nodule treatment strategy for right upper lobe nodule. NUCLEAR MEDICINE PET SKULL BASE TO THIGH TECHNIQUE: 8.4 mCi F-18 FDG was injected intravenously. Full-ring PET imaging was performed from the skull base to thigh after the radiotracer. CT data was obtained and used for attenuation correction and anatomic localization. Fasting blood glucose: 128 mg/dl COMPARISON:  CT 03/29/21 FINDINGS: Mediastinal blood pool activity: SUV max 1.69 Liver activity: SUV max NA NECK: No hypermetabolic lymph nodes in the neck. Incidental CT findings: none CHEST: 12 mm ground-glass RIGHT upper lobe pulmonary nodule has no metabolic activity (image 50/[id number]). Nodule appears slightly more dense than CT 10/26/2019. No hypermetabolic mediastinal lymph nodes. Incidental CT findings: Coronary artery calcification and aortic atherosclerotic calcification. ABDOMEN/PELVIS: No abnormal hypermetabolic activity within the liver, pancreas, adrenal glands, or spleen. No hypermetabolic lymph nodes in the abdomen or pelvis. Incidental CT findings: Calcification in the LEFT renal pelvis SKELETON: No focal hypermetabolic activity to suggest skeletal metastasis. Incidental CT findings: none IMPRESSION: 1. No metabolic activity associated ground-glass nodule in the RIGHT upper lobe. Ground-glass lesions are often non metabolic due to low cellularity. Recommend continued CT surveillance or tissue sampling. 2. No evidence of mediastinal adenopathy or metastatic disease. 3. Coronary artery calcification and Aortic Atherosclerosis (ICD10-I70.0). Electronically Signed   By: Suzy Bouchard M.D.   On: 04/27/2021 14:14           I have independently reviewed the above radiology studies  and reviewed the findings with the patient.    Recent Lab  Findings: Recent Labs       Lab Results  Component Value Date    WBC 8.3 07/13/2019    HGB 10.3 (L) 07/13/2019    HCT 32.8 (L) 07/13/2019    PLT 219 07/13/2019    GLUCOSE 144 (H) 07/14/2019    CHOL 220 (H) 07/21/2012    TRIG 350.0 (H) 07/21/2012    HDL 27.70 (L) 07/21/2012    LDLDIRECT 139.6 07/21/2012    LDLCALC 59 04/17/2011    ALT 16 07/12/2019    AST 24 07/12/2019    NA 141 07/14/2019    K 4.1 07/14/2019    CL 108 07/14/2019    CREATININE 0.93 07/14/2019    BUN 13 07/14/2019    CO2 26 07/14/2019    TSH 2.978 06/15/2018    INR 1.03 06/01/2015    HGBA1C 6.5 (H) 10/02/2018  PFTs: - FVC: 104% - FEV1: 109% -DLCO: 108%    Assessment / Plan:   70 year old female with a right upper lobe groundglass opacity.  I personally reviewed the cross-sectional imaging which included the CT scan and the PET/CT.  There is no significant uptake on PET/CT.  The groundglass opacity is quite deep in the lung thus obtaining a tissue sample via navigational bronchoscopy prior to surgery will be ideal.  I will speak with Dr. Lamonte Cardenas to schedule a time for this patient to undergo a combination procedure.  If the biopsy results are positive then she will undergo a right upper lobectomy performed robotically.  If the biopsy results are indeterminate then we will plan for a generous wedge resection and possible lobectomy.      I  spent 40 minutes with  the patient face to face in counseling and coordination of care.     Ronita Hargreaves Bary Leriche

## 2021-06-06 ENCOUNTER — Other Ambulatory Visit: Payer: Self-pay | Admitting: *Deleted

## 2021-06-06 ENCOUNTER — Encounter: Payer: Self-pay | Admitting: *Deleted

## 2021-06-06 DIAGNOSIS — E1129 Type 2 diabetes mellitus with other diabetic kidney complication: Secondary | ICD-10-CM

## 2021-06-06 DIAGNOSIS — E1169 Type 2 diabetes mellitus with other specified complication: Secondary | ICD-10-CM

## 2021-06-06 DIAGNOSIS — E669 Obesity, unspecified: Secondary | ICD-10-CM

## 2021-06-06 DIAGNOSIS — R911 Solitary pulmonary nodule: Secondary | ICD-10-CM

## 2021-06-14 ENCOUNTER — Ambulatory Visit: Payer: Medicare Other | Admitting: Emergency Medicine

## 2021-06-15 DIAGNOSIS — Z20822 Contact with and (suspected) exposure to covid-19: Secondary | ICD-10-CM | POA: Diagnosis not present

## 2021-06-19 ENCOUNTER — Ambulatory Visit: Payer: Medicare Other | Admitting: Nurse Practitioner

## 2021-06-26 NOTE — Progress Notes (Signed)
Surgical Instructions    Your procedure is scheduled on Thursday November 17th.  Report to [facility] Main Entrance "A" at 10 A.M., then check in with the Admitting office.  Call this number if you have problems the morning of surgery:  [phone]   If you have any questions prior to your surgery date call [phone]: Open Monday-Friday 8am-4pm    Remember:  Do not eat or drink anything after midnight the night before your surgery     Take these medicines the morning of surgery with A SIP OF WATER buPROPion (WELLBUTRIN XL) 150 MG 24 hr tablet cycloSPORINE (RESTASIS) 0.05 % ophthalmic emulsion levothyroxine (SYNTHROID, LEVOTHROID) 25 MCG tablet pantoprazole (PROTONIX) 40 MG tablet pravastatin (PRAVACHOL) 10 MG tablet sertraline (ZOLOFT) 50 MG tablet  IF NEEDED  albuterol (PROVENTIL) (2.5 MG/3ML) 0.083% nebulizer solution, please bring with you to the hospital ALPRAZolam (XANAX) 0.5 MG tablet   As of today, STOP taking any Aspirin (unless otherwise instructed by your surgeon) Aleve, Naproxen, Ibuprofen, Motrin, Advil, Goody's, BC's, all herbal medications, fish oil, and all vitamins.     After your COVID test   You are not required to quarantine however you are required to wear a well-fitting mask when you are out and around people not in your household.  If your mask becomes wet or soiled, replace with a new one.  Wash your hands often with soap and water for 20 seconds or clean your hands with an alcohol-based hand sanitizer that contains at least 60% alcohol.  Do not share personal items.  Notify your provider: if you are in close contact with someone who has COVID  or if you develop a fever of 100.4 or greater, sneezing, cough, sore throat, shortness of breath or body aches.             Do not wear jewelry or makeup Do not wear lotions, powders, perfumes, or deodorant. Do not shave 48 hours prior to surgery.   Do not bring valuables to the hospital. DO Not  wear nail polish, gel polish, artificial nails, or any other type of covering on natural nails including finger and toenails. If patients have artificial nails, gel coating, etc. that need to be removed by a nail salon, please have this removed prior to surgery or surgery may need to be canceled/delayed if the surgeon/ anesthesia feels like the patient is unable to be adequately monitored.             [facility] is not responsible for any belongings or valuables.  Do NOT Smoke (Tobacco/Vaping)  24 hours prior to your procedure  If you use a CPAP at night, you may bring your mask for your overnight stay.   Contacts, glasses, hearing aids, dentures or partials may not be worn into surgery, please bring cases for these belongings   For patients admitted to the hospital, discharge time will be determined by your treatment team.   Patients discharged the day of surgery will not be allowed to drive home, and someone needs to stay with them for 24 hours.  NO VISITORS WILL BE ALLOWED IN PRE-OP WHERE PATIENTS ARE PREPPED FOR SURGERY.  ONLY 1 SUPPORT PERSON MAY BE PRESENT IN THE WAITING ROOM WHILE YOU ARE IN SURGERY.  IF YOU ARE TO BE ADMITTED, ONCE YOU ARE IN YOUR ROOM YOU WILL BE ALLOWED TWO (2) VISITORS. 1 (ONE) VISITOR MAY STAY OVERNIGHT BUT MUST ARRIVE TO THE ROOM BY 8pm.  Minor children may have two parents present.  Special consideration for safety and communication needs will be reviewed on a case by case basis.  Special instructions:    Oral Hygiene is also important to reduce your risk of infection.  Remember - BRUSH YOUR TEETH THE MORNING OF SURGERY WITH YOUR REGULAR TOOTHPASTE   [facility]- Preparing For Surgery  Before surgery, you can play an important role. Because skin is not sterile, your skin needs to be as free of germs as possible. You can reduce the number of germs on your skin by washing with CHG (chlorahexidine gluconate) Soap before surgery.  CHG is an antiseptic cleaner which  kills germs and bonds with the skin to continue killing germs even after washing.     Please do not use if you have an allergy to CHG or antibacterial soaps. If your skin becomes reddened/irritated stop using the CHG.  Do not shave (including legs and underarms) for at least 48 hours prior to first CHG shower. It is OK to shave your face.  Please follow these instructions carefully.     Shower the NIGHT BEFORE SURGERY and the MORNING OF SURGERY with CHG Soap.   If you chose to wash your hair, wash your hair first as usual with your normal shampoo. After you shampoo, rinse your hair and body thoroughly to remove the shampoo.  Then [employer] and genitals (private parts) with your normal soap and rinse thoroughly to remove soap.  After that Use CHG Soap as you would any other liquid soap. You can apply CHG directly to the skin and wash gently with a scrungie or a clean washcloth.   Apply the CHG Soap to your body ONLY FROM THE NECK DOWN.  Do not use on open wounds or open sores. Avoid contact with your eyes, ears, mouth and genitals (private parts). Wash Face and genitals (private parts)  with your normal soap.   Wash thoroughly, paying special attention to the area where your surgery will be performed.  Thoroughly rinse your body with warm water from the neck down.  DO NOT shower/wash with your normal soap after using and rinsing off the CHG Soap.  Pat yourself dry with a CLEAN TOWEL.  Wear CLEAN PAJAMAS to bed the night before surgery  Place CLEAN SHEETS on your bed the night before your surgery  DO NOT SLEEP WITH PETS.   Day of Surgery:  Take a shower with CHG soap. Wear Clean/Comfortable clothing the morning of surgery Do not apply any deodorants/lotions.   Remember to brush your teeth WITH YOUR REGULAR TOOTHPASTE.   Please read over the following fact sheets that you were given.

## 2021-06-27 ENCOUNTER — Encounter (HOSPITAL_COMMUNITY)
Admission: RE | Admit: 2021-06-27 | Discharge: 2021-06-27 | Disposition: A | Discharge status: Home / Self Care | Payer: Medicare Other | Source: Ambulatory Visit | Attending: Thoracic Surgery (Cardiothoracic Vascular Surgery) | Admitting: Thoracic Surgery (Cardiothoracic Vascular Surgery)

## 2021-06-27 ENCOUNTER — Ambulatory Visit (HOSPITAL_COMMUNITY)
Admission: RE | Admit: 2021-06-27 | Discharge: 2021-06-27 | Disposition: A | Discharge status: Home / Self Care | Payer: Medicare Other | Source: Ambulatory Visit | Attending: Thoracic Surgery (Cardiothoracic Vascular Surgery) | Admitting: Thoracic Surgery (Cardiothoracic Vascular Surgery)

## 2021-06-27 ENCOUNTER — Other Ambulatory Visit: Payer: Self-pay

## 2021-06-27 ENCOUNTER — Encounter (HOSPITAL_COMMUNITY): Payer: Self-pay

## 2021-06-27 VITALS — BP 113/65 | HR 104 | Temp 98.0°F | Resp 18 | Ht 62.0 in | Wt 167.0 lb

## 2021-06-27 DIAGNOSIS — E119 Type 2 diabetes mellitus without complications: Secondary | ICD-10-CM | POA: Diagnosis present

## 2021-06-27 DIAGNOSIS — E1129 Type 2 diabetes mellitus with other diabetic kidney complication: Secondary | ICD-10-CM | POA: Insufficient documentation

## 2021-06-27 DIAGNOSIS — I10 Essential (primary) hypertension: Secondary | ICD-10-CM | POA: Diagnosis present

## 2021-06-27 DIAGNOSIS — Z83438 Family history of other disorder of lipoprotein metabolism and other lipidemia: Secondary | ICD-10-CM | POA: Diagnosis not present

## 2021-06-27 DIAGNOSIS — Z20822 Contact with and (suspected) exposure to covid-19: Secondary | ICD-10-CM | POA: Diagnosis present

## 2021-06-27 DIAGNOSIS — Z8249 Family history of ischemic heart disease and other diseases of the circulatory system: Secondary | ICD-10-CM | POA: Diagnosis not present

## 2021-06-27 DIAGNOSIS — R110 Nausea: Secondary | ICD-10-CM | POA: Diagnosis not present

## 2021-06-27 DIAGNOSIS — E669 Obesity, unspecified: Secondary | ICD-10-CM | POA: Insufficient documentation

## 2021-06-27 DIAGNOSIS — M62838 Other muscle spasm: Secondary | ICD-10-CM | POA: Diagnosis not present

## 2021-06-27 DIAGNOSIS — Z01811 Encounter for preprocedural respiratory examination: Secondary | ICD-10-CM

## 2021-06-27 DIAGNOSIS — E785 Hyperlipidemia, unspecified: Secondary | ICD-10-CM | POA: Diagnosis present

## 2021-06-27 DIAGNOSIS — Z825 Family history of asthma and other chronic lower respiratory diseases: Secondary | ICD-10-CM | POA: Diagnosis not present

## 2021-06-27 DIAGNOSIS — Z87891 Personal history of nicotine dependence: Secondary | ICD-10-CM | POA: Diagnosis not present

## 2021-06-27 DIAGNOSIS — J439 Emphysema, unspecified: Secondary | ICD-10-CM | POA: Diagnosis not present

## 2021-06-27 DIAGNOSIS — Z01818 Encounter for other preprocedural examination: Secondary | ICD-10-CM | POA: Diagnosis not present

## 2021-06-27 DIAGNOSIS — E039 Hypothyroidism, unspecified: Secondary | ICD-10-CM | POA: Diagnosis present

## 2021-06-27 DIAGNOSIS — I700 Atherosclerosis of aorta: Secondary | ICD-10-CM | POA: Diagnosis not present

## 2021-06-27 DIAGNOSIS — D62 Acute posthemorrhagic anemia: Secondary | ICD-10-CM | POA: Diagnosis not present

## 2021-06-27 DIAGNOSIS — Z4682 Encounter for fitting and adjustment of non-vascular catheter: Secondary | ICD-10-CM | POA: Diagnosis not present

## 2021-06-27 DIAGNOSIS — Z820 Family history of epilepsy and other diseases of the nervous system: Secondary | ICD-10-CM | POA: Diagnosis not present

## 2021-06-27 DIAGNOSIS — M1990 Unspecified osteoarthritis, unspecified site: Secondary | ICD-10-CM | POA: Diagnosis present

## 2021-06-27 DIAGNOSIS — J9811 Atelectasis: Secondary | ICD-10-CM | POA: Diagnosis not present

## 2021-06-27 DIAGNOSIS — C3411 Malignant neoplasm of upper lobe, right bronchus or lung: Secondary | ICD-10-CM | POA: Diagnosis present

## 2021-06-27 DIAGNOSIS — F32A Depression, unspecified: Secondary | ICD-10-CM | POA: Diagnosis present

## 2021-06-27 DIAGNOSIS — E042 Nontoxic multinodular goiter: Secondary | ICD-10-CM | POA: Diagnosis not present

## 2021-06-27 DIAGNOSIS — E1169 Type 2 diabetes mellitus with other specified complication: Secondary | ICD-10-CM

## 2021-06-27 DIAGNOSIS — J9383 Other pneumothorax: Secondary | ICD-10-CM | POA: Diagnosis not present

## 2021-06-27 DIAGNOSIS — R911 Solitary pulmonary nodule: Secondary | ICD-10-CM

## 2021-06-27 DIAGNOSIS — K449 Diaphragmatic hernia without obstruction or gangrene: Secondary | ICD-10-CM | POA: Diagnosis present

## 2021-06-27 DIAGNOSIS — C3491 Malignant neoplasm of unspecified part of right bronchus or lung: Secondary | ICD-10-CM | POA: Diagnosis not present

## 2021-06-27 DIAGNOSIS — J939 Pneumothorax, unspecified: Secondary | ICD-10-CM | POA: Diagnosis not present

## 2021-06-27 DIAGNOSIS — F909 Attention-deficit hyperactivity disorder, unspecified type: Secondary | ICD-10-CM | POA: Diagnosis present

## 2021-06-27 DIAGNOSIS — R918 Other nonspecific abnormal finding of lung field: Secondary | ICD-10-CM | POA: Diagnosis not present

## 2021-06-27 DIAGNOSIS — K219 Gastro-esophageal reflux disease without esophagitis: Secondary | ICD-10-CM | POA: Diagnosis present

## 2021-06-27 DIAGNOSIS — J984 Other disorders of lung: Secondary | ICD-10-CM | POA: Diagnosis not present

## 2021-06-27 DIAGNOSIS — Z803 Family history of malignant neoplasm of breast: Secondary | ICD-10-CM | POA: Diagnosis not present

## 2021-06-27 DIAGNOSIS — Z8619 Personal history of other infectious and parasitic diseases: Secondary | ICD-10-CM | POA: Diagnosis not present

## 2021-06-27 DIAGNOSIS — I959 Hypotension, unspecified: Secondary | ICD-10-CM | POA: Diagnosis not present

## 2021-06-27 HISTORY — DX: Chronic kidney disease, unspecified: N18.9

## 2021-06-27 HISTORY — DX: Hypothyroidism, unspecified: E03.9

## 2021-06-27 LAB — BLOOD GAS, ARTERIAL
Acid-Base Excess: 2 mmol/L (ref 0.0–2.0)
Bicarbonate: 25.4 mmol/L (ref 20.0–28.0)
Drawn by: 602861
FIO2: 21
O2 Saturation: 97.8 %
Patient temperature: 37
pCO2 arterial: 35.5 mmHg (ref 32.0–48.0)
pH, Arterial: 7.468 — ABNORMAL HIGH (ref 7.350–7.450)
pO2, Arterial: 95.8 mmHg (ref 83.0–108.0)

## 2021-06-27 LAB — APTT: aPTT: 31 seconds (ref 24–36)

## 2021-06-27 LAB — URINALYSIS, ROUTINE W REFLEX MICROSCOPIC
Bilirubin Urine: NEGATIVE
Glucose, UA: NEGATIVE mg/dL
Hgb urine dipstick: NEGATIVE
Ketones, ur: NEGATIVE mg/dL
Nitrite: NEGATIVE
Protein, ur: NEGATIVE mg/dL
Specific Gravity, Urine: 1.011 (ref 1.005–1.030)
pH: 5 (ref 5.0–8.0)

## 2021-06-27 LAB — COMPREHENSIVE METABOLIC PANEL
ALT: 20 U/L (ref 0–44)
AST: 23 U/L (ref 15–41)
Albumin: 4 g/dL (ref 3.5–5.0)
Alkaline Phosphatase: 82 U/L (ref 38–126)
Anion gap: 11 (ref 5–15)
BUN: 14 mg/dL (ref 8–23)
CO2: 28 mmol/L (ref 22–32)
Calcium: 9 mg/dL (ref 8.9–10.3)
Chloride: 101 mmol/L (ref 98–111)
Creatinine, Ser: 1.12 mg/dL — ABNORMAL HIGH (ref 0.44–1.00)
GFR, Estimated: 53 mL/min — ABNORMAL LOW (ref >60)
Glucose, Bld: 122 mg/dL — ABNORMAL HIGH (ref 70–99)
Potassium: 3 mmol/L — ABNORMAL LOW (ref 3.5–5.1)
Sodium: 140 mmol/L (ref 135–145)
Total Bilirubin: 0.8 mg/dL (ref 0.3–1.2)
Total Protein: 7 g/dL (ref 6.5–8.1)

## 2021-06-27 LAB — CBC
HCT: 40.1 % (ref 36.0–46.0)
Hemoglobin: 13 g/dL (ref 12.0–15.0)
MCH: 27.9 pg (ref 26.0–34.0)
MCHC: 32.4 g/dL (ref 30.0–36.0)
MCV: 86.1 fL (ref 80.0–100.0)
Platelets: 291 10*3/uL (ref 150–400)
RBC: 4.66 MIL/uL (ref 3.87–5.11)
RDW: 13 % (ref 11.5–15.5)
WBC: 9.4 10*3/uL (ref 4.0–10.5)
nRBC: 0 % (ref 0.0–0.2)

## 2021-06-27 LAB — TYPE AND SCREEN
ABO/RH(D): O POS
Antibody Screen: NEGATIVE

## 2021-06-27 LAB — GLUCOSE, CAPILLARY: Glucose-Capillary: 198 mg/dL — ABNORMAL HIGH (ref 70–99)

## 2021-06-27 LAB — PROTIME-INR
INR: 1.1 (ref 0.8–1.2)
Prothrombin Time: 13.9 seconds (ref 11.4–15.2)

## 2021-06-27 LAB — SURGICAL PCR SCREEN
MRSA, PCR: NEGATIVE
Staphylococcus aureus: NEGATIVE

## 2021-06-27 LAB — SARS CORONAVIRUS 2 (TAT 6-24 HRS): SARS Coronavirus 2: NEGATIVE

## 2021-06-27 NOTE — Progress Notes (Addendum)
PCP - Dr. Maurice Small with [facility] now left will start with new PCP with [facility] on [facility] Cardiologist -  Denies  Chest x-ray - 06/27/21 EKG - 06/27/21 Stress Test - Denies ECHO - Denies Cardiac Cath - Denies  Sleep Study - Denies  Pre diabetic  CBG 185 at PAT appt last ate at 1230  COVID TEST- 06/27/21   Anesthesia review: Yes abnormal EKG RBBB  Patient denies shortness of breath, fever, cough and chest pain at PAT appointment   All instructions explained to the patient, with a verbal understanding of the material. Patient agrees to go over the instructions while at home for a better understanding. Patient also instructed to wear a mask while in public after being tested for COVID-19. The opportunity to ask questions was provided.

## 2021-06-29 ENCOUNTER — Other Ambulatory Visit: Payer: Self-pay

## 2021-06-29 ENCOUNTER — Inpatient Hospital Stay (HOSPITAL_COMMUNITY): Payer: Medicare Other

## 2021-06-29 ENCOUNTER — Inpatient Hospital Stay (HOSPITAL_COMMUNITY): Payer: Medicare Other | Admitting: Registered Nurse

## 2021-06-29 ENCOUNTER — Encounter (HOSPITAL_COMMUNITY)
Admission: RE | Disposition: A | Discharge status: Home / Self Care | Payer: Self-pay | Source: Home / Self Care | Attending: Thoracic Surgery (Cardiothoracic Vascular Surgery)

## 2021-06-29 ENCOUNTER — Inpatient Hospital Stay (HOSPITAL_COMMUNITY)
Admission: RE | Admit: 2021-06-29 | Discharge: 2021-07-02 | DRG: 164 | Disposition: A | Discharge status: Home / Self Care | Payer: Medicare Other | Attending: Thoracic Surgery (Cardiothoracic Vascular Surgery) | Admitting: Thoracic Surgery (Cardiothoracic Vascular Surgery)

## 2021-06-29 ENCOUNTER — Inpatient Hospital Stay (HOSPITAL_COMMUNITY): Payer: Medicare Other | Admitting: Physician Assistant

## 2021-06-29 ENCOUNTER — Encounter (HOSPITAL_COMMUNITY): Payer: Self-pay | Admitting: Certified Registered"

## 2021-06-29 ENCOUNTER — Encounter (HOSPITAL_COMMUNITY): Payer: Self-pay | Admitting: Thoracic Surgery (Cardiothoracic Vascular Surgery)

## 2021-06-29 ENCOUNTER — Ambulatory Visit (HOSPITAL_COMMUNITY): Admit: 2021-06-29 | Payer: Medicare Other | Admitting: Emergency Medicine

## 2021-06-29 DIAGNOSIS — Z820 Family history of epilepsy and other diseases of the nervous system: Secondary | ICD-10-CM | POA: Diagnosis not present

## 2021-06-29 DIAGNOSIS — K219 Gastro-esophageal reflux disease without esophagitis: Secondary | ICD-10-CM | POA: Diagnosis present

## 2021-06-29 DIAGNOSIS — K449 Diaphragmatic hernia without obstruction or gangrene: Secondary | ICD-10-CM | POA: Diagnosis present

## 2021-06-29 DIAGNOSIS — R110 Nausea: Secondary | ICD-10-CM | POA: Diagnosis not present

## 2021-06-29 DIAGNOSIS — E039 Hypothyroidism, unspecified: Secondary | ICD-10-CM | POA: Diagnosis present

## 2021-06-29 DIAGNOSIS — Z7984 Long term (current) use of oral hypoglycemic drugs: Secondary | ICD-10-CM

## 2021-06-29 DIAGNOSIS — Z83438 Family history of other disorder of lipoprotein metabolism and other lipidemia: Secondary | ICD-10-CM

## 2021-06-29 DIAGNOSIS — J9811 Atelectasis: Secondary | ICD-10-CM | POA: Diagnosis not present

## 2021-06-29 DIAGNOSIS — I700 Atherosclerosis of aorta: Secondary | ICD-10-CM | POA: Diagnosis not present

## 2021-06-29 DIAGNOSIS — Z825 Family history of asthma and other chronic lower respiratory diseases: Secondary | ICD-10-CM | POA: Diagnosis not present

## 2021-06-29 DIAGNOSIS — Z9689 Presence of other specified functional implants: Secondary | ICD-10-CM

## 2021-06-29 DIAGNOSIS — J9383 Other pneumothorax: Secondary | ICD-10-CM | POA: Diagnosis not present

## 2021-06-29 DIAGNOSIS — M1990 Unspecified osteoarthritis, unspecified site: Secondary | ICD-10-CM | POA: Diagnosis present

## 2021-06-29 DIAGNOSIS — E785 Hyperlipidemia, unspecified: Secondary | ICD-10-CM | POA: Diagnosis present

## 2021-06-29 DIAGNOSIS — Z803 Family history of malignant neoplasm of breast: Secondary | ICD-10-CM | POA: Diagnosis not present

## 2021-06-29 DIAGNOSIS — Z79899 Other long term (current) drug therapy: Secondary | ICD-10-CM

## 2021-06-29 DIAGNOSIS — Z419 Encounter for procedure for purposes other than remedying health state, unspecified: Secondary | ICD-10-CM

## 2021-06-29 DIAGNOSIS — D62 Acute posthemorrhagic anemia: Secondary | ICD-10-CM | POA: Diagnosis not present

## 2021-06-29 DIAGNOSIS — Z20822 Contact with and (suspected) exposure to covid-19: Secondary | ICD-10-CM | POA: Diagnosis present

## 2021-06-29 DIAGNOSIS — Z8249 Family history of ischemic heart disease and other diseases of the circulatory system: Secondary | ICD-10-CM

## 2021-06-29 DIAGNOSIS — Z8619 Personal history of other infectious and parasitic diseases: Secondary | ICD-10-CM

## 2021-06-29 DIAGNOSIS — Z87891 Personal history of nicotine dependence: Secondary | ICD-10-CM | POA: Diagnosis not present

## 2021-06-29 DIAGNOSIS — E119 Type 2 diabetes mellitus without complications: Secondary | ICD-10-CM | POA: Diagnosis present

## 2021-06-29 DIAGNOSIS — E1169 Type 2 diabetes mellitus with other specified complication: Secondary | ICD-10-CM

## 2021-06-29 DIAGNOSIS — C3411 Malignant neoplasm of upper lobe, right bronchus or lung: Principal | ICD-10-CM | POA: Diagnosis present

## 2021-06-29 DIAGNOSIS — R911 Solitary pulmonary nodule: Secondary | ICD-10-CM | POA: Diagnosis present

## 2021-06-29 DIAGNOSIS — I10 Essential (primary) hypertension: Secondary | ICD-10-CM | POA: Diagnosis present

## 2021-06-29 DIAGNOSIS — R918 Other nonspecific abnormal finding of lung field: Secondary | ICD-10-CM | POA: Diagnosis not present

## 2021-06-29 DIAGNOSIS — M62838 Other muscle spasm: Secondary | ICD-10-CM | POA: Diagnosis not present

## 2021-06-29 DIAGNOSIS — J939 Pneumothorax, unspecified: Secondary | ICD-10-CM

## 2021-06-29 DIAGNOSIS — F909 Attention-deficit hyperactivity disorder, unspecified type: Secondary | ICD-10-CM | POA: Diagnosis present

## 2021-06-29 DIAGNOSIS — Z90710 Acquired absence of both cervix and uterus: Secondary | ICD-10-CM

## 2021-06-29 DIAGNOSIS — F32A Depression, unspecified: Secondary | ICD-10-CM | POA: Diagnosis present

## 2021-06-29 DIAGNOSIS — I959 Hypotension, unspecified: Secondary | ICD-10-CM | POA: Diagnosis not present

## 2021-06-29 DIAGNOSIS — Z79890 Hormone replacement therapy (postmenopausal): Secondary | ICD-10-CM

## 2021-06-29 HISTORY — PX: LOBECTOMY: SHX5089

## 2021-06-29 HISTORY — PX: LYMPH NODE DISSECTION: SHX5087

## 2021-06-29 HISTORY — PX: INTERCOSTAL NERVE BLOCK: SHX5021

## 2021-06-29 HISTORY — PX: BRONCHIAL BRUSHINGS: SHX5108

## 2021-06-29 HISTORY — PX: BRONCHIAL NEEDLE ASPIRATION BIOPSY: SHX5106

## 2021-06-29 HISTORY — PX: FIDUCIAL MARKER PLACEMENT: SHX6858

## 2021-06-29 LAB — ABO/RH: ABO/RH(D): O POS

## 2021-06-29 LAB — GLUCOSE, CAPILLARY
Glucose-Capillary: 130 mg/dL — ABNORMAL HIGH (ref 70–99)
Glucose-Capillary: 160 mg/dL — ABNORMAL HIGH (ref 70–99)
Glucose-Capillary: 127 mg/dL — ABNORMAL HIGH (ref 70–99)

## 2021-06-29 SURGERY — BRONCHOSCOPY, WITH BIOPSY USING ELECTROMAGNETIC NAVIGATION
Anesthesia: General

## 2021-06-29 SURGERY — WEDGE RESECTION, LUNG, ROBOT-ASSISTED, THORACOSCOPIC
Anesthesia: General | Site: Chest | Laterality: Right

## 2021-06-29 MED ORDER — ROCURONIUM BROMIDE 10 MG/ML (PF) SYRINGE
PREFILLED_SYRINGE | INTRAVENOUS | Status: AC
  Filled 2021-06-29: qty 30

## 2021-06-29 MED ORDER — 0.9 % SODIUM CHLORIDE (POUR BTL) OPTIME
TOPICAL | Status: DC | PRN
  Administered 2021-06-29: 12:00:00 1000 mL

## 2021-06-29 MED ORDER — ACETAMINOPHEN 500 MG PO TABS
1000.0000 mg | ORAL_TABLET | Freq: Every evening | ORAL | Status: DC | PRN
  Administered 2021-06-29: 1000 mg via ORAL

## 2021-06-29 MED ORDER — ACETAMINOPHEN 160 MG/5ML PO SOLN: 1000.0000 mg | Freq: Four times a day (QID) | ORAL | Status: DC

## 2021-06-29 MED ORDER — ROCURONIUM BROMIDE 10 MG/ML (PF) SYRINGE
PREFILLED_SYRINGE | INTRAVENOUS | Status: DC | PRN
  Administered 2021-06-29: 80 mg via INTRAVENOUS
  Administered 2021-06-29 (×2): 20 mg via INTRAVENOUS

## 2021-06-29 MED ORDER — DIPHENHYDRAMINE-APAP (SLEEP) 25-500 MG PO TABS: 2.0000 | ORAL_TABLET | Freq: Every evening | ORAL | Status: DC | PRN

## 2021-06-29 MED ORDER — FENTANYL CITRATE (PF) 100 MCG/2ML IJ SOLN
INTRAMUSCULAR | Status: AC
  Filled 2021-06-29: qty 2

## 2021-06-29 MED ORDER — ENOXAPARIN SODIUM 40 MG/0.4ML IJ SOSY
40.0000 mg | PREFILLED_SYRINGE | INTRAMUSCULAR | Status: DC
  Administered 2021-06-30 – 2021-07-02 (×3): 40 mg via SUBCUTANEOUS
  Filled 2021-06-29 (×3): qty 0.4

## 2021-06-29 MED ORDER — TRAMADOL HCL 50 MG PO TABS
50.0000 mg | ORAL_TABLET | Freq: Four times a day (QID) | ORAL | Status: DC | PRN
  Administered 2021-06-29 – 2021-07-01 (×6): 100 mg via ORAL
  Filled 2021-06-29 (×6): qty 2

## 2021-06-29 MED ORDER — ACETAMINOPHEN 500 MG PO TABS
1000.0000 mg | ORAL_TABLET | Freq: Four times a day (QID) | ORAL | Status: DC
  Administered 2021-06-29 – 2021-07-02 (×11): 1000 mg via ORAL
  Filled 2021-06-29 (×12): qty 2

## 2021-06-29 MED ORDER — PHENYLEPHRINE 40 MCG/ML (10ML) SYRINGE FOR IV PUSH (FOR BLOOD PRESSURE SUPPORT)
PREFILLED_SYRINGE | INTRAVENOUS | Status: DC | PRN
  Administered 2021-06-29 (×3): 80 ug via INTRAVENOUS

## 2021-06-29 MED ORDER — LEVALBUTEROL HCL 0.63 MG/3ML IN NEBU: 0.6300 mg | INHALATION_SOLUTION | Freq: Three times a day (TID) | RESPIRATORY_TRACT | Status: DC | PRN

## 2021-06-29 MED ORDER — FENTANYL CITRATE (PF) 250 MCG/5ML IJ SOLN
INTRAMUSCULAR | Status: DC | PRN
  Administered 2021-06-29: 100 ug via INTRAVENOUS
  Administered 2021-06-29: 150 ug via INTRAVENOUS

## 2021-06-29 MED ORDER — PROPOFOL 10 MG/ML IV BOLUS
INTRAVENOUS | Status: DC | PRN
  Administered 2021-06-29: 30 mg via INTRAVENOUS
  Administered 2021-06-29: 110 mg via INTRAVENOUS
  Administered 2021-06-29: 20 mg via INTRAVENOUS

## 2021-06-29 MED ORDER — LIDOCAINE 2% (20 MG/ML) 5 ML SYRINGE
INTRAMUSCULAR | Status: AC
  Filled 2021-06-29: qty 5

## 2021-06-29 MED ORDER — SUGAMMADEX SODIUM 200 MG/2ML IV SOLN
INTRAVENOUS | Status: DC | PRN
  Administered 2021-06-29 (×2): 100 mg via INTRAVENOUS

## 2021-06-29 MED ORDER — BUPIVACAINE HCL (PF) 0.5 % IJ SOLN
INTRAMUSCULAR | Status: AC
  Filled 2021-06-29: qty 30

## 2021-06-29 MED ORDER — ACETAMINOPHEN 500 MG PO TABS: 1000.0000 mg | ORAL_TABLET | Freq: Once | ORAL | Status: DC | PRN

## 2021-06-29 MED ORDER — FENTANYL CITRATE (PF) 250 MCG/5ML IJ SOLN
INTRAMUSCULAR | Status: AC
  Filled 2021-06-29: qty 5

## 2021-06-29 MED ORDER — LIDOCAINE 2% (20 MG/ML) 5 ML SYRINGE
INTRAMUSCULAR | Status: DC | PRN
  Administered 2021-06-29: 60 mg via INTRAVENOUS

## 2021-06-29 MED ORDER — LACTATED RINGERS IV SOLN: INTRAVENOUS | Status: DC

## 2021-06-29 MED ORDER — DEXAMETHASONE SODIUM PHOSPHATE 10 MG/ML IJ SOLN
INTRAMUSCULAR | Status: DC | PRN
  Administered 2021-06-29: 10 mg via INTRAVENOUS

## 2021-06-29 MED ORDER — ONDANSETRON HCL 4 MG/2ML IJ SOLN
INTRAMUSCULAR | Status: AC
  Filled 2021-06-29: qty 4

## 2021-06-29 MED ORDER — SERTRALINE HCL 100 MG PO TABS
100.0000 mg | ORAL_TABLET | Freq: Every day | ORAL | Status: DC
  Administered 2021-06-30 – 2021-07-02 (×3): 100 mg via ORAL
  Filled 2021-06-29 (×3): qty 1

## 2021-06-29 MED ORDER — HEMOSTATIC AGENTS (NO CHARGE) OPTIME
TOPICAL | Status: DC | PRN
  Administered 2021-06-29 (×2): 1 via TOPICAL

## 2021-06-29 MED ORDER — SENNOSIDES-DOCUSATE SODIUM 8.6-50 MG PO TABS
1.0000 | ORAL_TABLET | Freq: Every day | ORAL | Status: DC
  Administered 2021-06-29 – 2021-07-01 (×3): 1 via ORAL
  Filled 2021-06-29 (×3): qty 1

## 2021-06-29 MED ORDER — AMPHETAMINE-DEXTROAMPHETAMINE 10 MG PO TABS: 10.0000 mg | ORAL_TABLET | Freq: Every morning | ORAL | Status: DC

## 2021-06-29 MED ORDER — ONDANSETRON HCL 4 MG/2ML IJ SOLN
INTRAMUSCULAR | Status: DC | PRN
  Administered 2021-06-29: 4 mg via INTRAVENOUS

## 2021-06-29 MED ORDER — CEFAZOLIN SODIUM-DEXTROSE 2-4 GM/100ML-% IV SOLN
2.0000 g | INTRAVENOUS | Status: AC
  Administered 2021-06-29: 12:00:00 2 g via INTRAVENOUS

## 2021-06-29 MED ORDER — PROSIGHT PO TABS
1.0000 | ORAL_TABLET | Freq: Two times a day (BID) | ORAL | Status: DC
  Administered 2021-06-29 – 2021-07-02 (×6): 1 via ORAL
  Filled 2021-06-29 (×6): qty 1

## 2021-06-29 MED ORDER — OXYCODONE HCL 5 MG PO TABS: 5.0000 mg | ORAL_TABLET | Freq: Once | ORAL | Status: DC | PRN

## 2021-06-29 MED ORDER — ORAL CARE MOUTH RINSE: 15.0000 mL | Freq: Once | OROMUCOSAL | Status: AC

## 2021-06-29 MED ORDER — CHLORHEXIDINE GLUCONATE 0.12 % MT SOLN
OROMUCOSAL | Status: AC
  Administered 2021-06-29: 11:00:00 15 mL via OROMUCOSAL
  Filled 2021-06-29: qty 15

## 2021-06-29 MED ORDER — BUPROPION HCL ER (XL) 150 MG PO TB24
150.0000 mg | ORAL_TABLET | Freq: Every day | ORAL | Status: DC
  Administered 2021-06-30 – 2021-07-02 (×3): 150 mg via ORAL
  Filled 2021-06-29 (×3): qty 1

## 2021-06-29 MED ORDER — ACETAMINOPHEN 10 MG/ML IV SOLN
INTRAVENOUS | Status: AC
  Filled 2021-06-29: qty 100

## 2021-06-29 MED ORDER — CYCLOSPORINE 0.05 % OP EMUL
1.0000 [drp] | Freq: Two times a day (BID) | OPHTHALMIC | Status: DC
  Administered 2021-06-29 – 2021-07-02 (×6): 1 [drp] via OPHTHALMIC
  Filled 2021-06-29 (×7): qty 30

## 2021-06-29 MED ORDER — PANTOPRAZOLE SODIUM 40 MG PO TBEC
40.0000 mg | DELAYED_RELEASE_TABLET | Freq: Every day | ORAL | Status: DC
  Administered 2021-06-30 – 2021-07-02 (×3): 40 mg via ORAL
  Filled 2021-06-29 (×3): qty 1

## 2021-06-29 MED ORDER — DIPHENHYDRAMINE HCL 25 MG PO CAPS
50.0000 mg | ORAL_CAPSULE | Freq: Every evening | ORAL | Status: DC | PRN
  Administered 2021-06-29 – 2021-07-02 (×2): 50 mg via ORAL
  Filled 2021-06-29 (×2): qty 2

## 2021-06-29 MED ORDER — CEFAZOLIN SODIUM-DEXTROSE 2-4 GM/100ML-% IV SOLN
INTRAVENOUS | Status: AC
  Filled 2021-06-29: qty 100

## 2021-06-29 MED ORDER — BUPIVACAINE LIPOSOME 1.3 % IJ SUSP
INTRAMUSCULAR | Status: DC | PRN
  Administered 2021-06-29: 14:00:00 50 mL

## 2021-06-29 MED ORDER — LEVOTHYROXINE SODIUM 25 MCG PO TABS
25.0000 ug | ORAL_TABLET | Freq: Every day | ORAL | Status: DC
  Administered 2021-06-30 – 2021-07-02 (×3): 25 ug via ORAL
  Filled 2021-06-29 (×3): qty 1

## 2021-06-29 MED ORDER — ONDANSETRON HCL 4 MG/2ML IJ SOLN
4.0000 mg | Freq: Four times a day (QID) | INTRAMUSCULAR | Status: DC | PRN
  Administered 2021-06-29 – 2021-06-30 (×2): 4 mg via INTRAVENOUS
  Filled 2021-06-29 (×2): qty 2

## 2021-06-29 MED ORDER — ACETAMINOPHEN 160 MG/5ML PO SOLN: 1000.0000 mg | Freq: Once | ORAL | Status: DC | PRN

## 2021-06-29 MED ORDER — MORPHINE SULFATE (PF) 2 MG/ML IV SOLN
1.0000 mg | INTRAVENOUS | Status: DC | PRN
  Administered 2021-06-29 – 2021-07-02 (×7): 2 mg via INTRAVENOUS
  Filled 2021-06-29 (×9): qty 1

## 2021-06-29 MED ORDER — METHYLENE BLUE 0.5 % INJ SOLN
INTRAVENOUS | Status: DC | PRN
  Administered 2021-06-29: 12:00:00 1.5 mL

## 2021-06-29 MED ORDER — FENTANYL CITRATE (PF) 100 MCG/2ML IJ SOLN
25.0000 ug | INTRAMUSCULAR | Status: DC | PRN
  Administered 2021-06-29 (×2): 25 ug via INTRAVENOUS

## 2021-06-29 MED ORDER — PRAVASTATIN SODIUM 10 MG PO TABS
10.0000 mg | ORAL_TABLET | Freq: Every day | ORAL | Status: DC
  Administered 2021-06-30 – 2021-07-01 (×2): 10 mg via ORAL
  Filled 2021-06-29 (×3): qty 1

## 2021-06-29 MED ORDER — BUPIVACAINE LIPOSOME 1.3 % IJ SUSP
INTRAMUSCULAR | Status: AC
  Filled 2021-06-29: qty 20

## 2021-06-29 MED ORDER — ACETAMINOPHEN 10 MG/ML IV SOLN
INTRAVENOUS | Status: DC | PRN
  Administered 2021-06-29: 1000 mg via INTRAVENOUS

## 2021-06-29 MED ORDER — DEXAMETHASONE SODIUM PHOSPHATE 10 MG/ML IJ SOLN
INTRAMUSCULAR | Status: AC
  Filled 2021-06-29: qty 1

## 2021-06-29 MED ORDER — ALPRAZOLAM 0.5 MG PO TABS
0.5000 mg | ORAL_TABLET | Freq: Three times a day (TID) | ORAL | Status: DC | PRN
  Administered 2021-06-30: 0.5 mg via ORAL
  Filled 2021-06-29: qty 1

## 2021-06-29 MED ORDER — ACETAMINOPHEN 10 MG/ML IV SOLN: 1000.0000 mg | Freq: Once | INTRAVENOUS | Status: DC | PRN

## 2021-06-29 MED ORDER — OXYCODONE HCL 5 MG/5ML PO SOLN: 5.0000 mg | Freq: Once | ORAL | Status: DC | PRN

## 2021-06-29 MED ORDER — BISACODYL 5 MG PO TBEC
10.0000 mg | DELAYED_RELEASE_TABLET | Freq: Every day | ORAL | Status: DC
  Administered 2021-06-30 – 2021-07-02 (×3): 10 mg via ORAL
  Filled 2021-06-29 (×3): qty 2

## 2021-06-29 MED ORDER — CHLORHEXIDINE GLUCONATE 0.12 % MT SOLN: 15.0000 mL | Freq: Once | OROMUCOSAL | Status: AC

## 2021-06-29 SURGICAL SUPPLY — 109 items
ADH SKN CLS APL DERMABOND .7 (GAUZE/BANDAGES/DRESSINGS) ×2
APL PRP STRL LF DISP 70% ISPRP (MISCELLANEOUS) ×2
BAG SPEC RTRVL C125 8X14 (MISCELLANEOUS) ×2
BLADE CLIPPER SURG (BLADE) ×3 IMPLANT
BNDG COHESIVE 6X5 TAN STRL LF (GAUZE/BANDAGES/DRESSINGS) ×3 IMPLANT
CANISTER SUCT 3000ML PPV (MISCELLANEOUS) ×6 IMPLANT
CANNULA REDUC XI 12-8 STAPL (CANNULA) ×6
CANNULA REDUCER 12-8 DVNC XI (CANNULA) ×4 IMPLANT
CATH TROCAR 20FR (CATHETERS) IMPLANT
CHLORAPREP W/TINT 26 (MISCELLANEOUS) ×3 IMPLANT
CLIP VESOCCLUDE MED 6/CT (CLIP) IMPLANT
CNTNR URN SCR LID CUP LEK RST (MISCELLANEOUS) ×4 IMPLANT
CONN ST 1/4X3/8  BEN (MISCELLANEOUS)
CONN ST 1/4X3/8 BEN (MISCELLANEOUS) IMPLANT
CONT SPEC 4OZ STRL OR WHT (MISCELLANEOUS) ×6
DEFOGGER SCOPE WARMER CLEARIFY (MISCELLANEOUS) ×3 IMPLANT
DERMABOND ADVANCED (GAUZE/BANDAGES/DRESSINGS) ×1
DERMABOND ADVANCED .7 DNX12 (GAUZE/BANDAGES/DRESSINGS) ×2 IMPLANT
DRAIN CHANNEL 28F RND 3/8 FF (WOUND CARE) IMPLANT
DRAIN CHANNEL 32F RND 10.7 FF (WOUND CARE) IMPLANT
DRAPE ARM DVNC X/XI (DISPOSABLE) ×8 IMPLANT
DRAPE COLUMN DVNC XI (DISPOSABLE) ×2 IMPLANT
DRAPE CV SPLIT W-CLR ANES SCRN (DRAPES) ×3 IMPLANT
DRAPE DA VINCI XI ARM (DISPOSABLE) ×12
DRAPE DA VINCI XI COLUMN (DISPOSABLE) ×3
DRAPE HALF SHEET 40X57 (DRAPES) ×3 IMPLANT
DRAPE ORTHO SPLIT 77X108 STRL (DRAPES) ×3
DRAPE SURG ORHT 6 SPLT 77X108 (DRAPES) ×2 IMPLANT
ELECT BLADE 6.5 EXT (BLADE) IMPLANT
ELECT REM PT RETURN 9FT ADLT (ELECTROSURGICAL) ×3
ELECTRODE REM PT RTRN 9FT ADLT (ELECTROSURGICAL) ×2 IMPLANT
GAUZE KITTNER 4X5 RF (MISCELLANEOUS) ×3 IMPLANT
GAUZE SPONGE 4X4 12PLY STRL (GAUZE/BANDAGES/DRESSINGS) ×3 IMPLANT
GLOVE SURG ENC MOIS LTX SZ7.5 (GLOVE) ×6 IMPLANT
GOWN STRL REUS W/ TWL LRG LVL3 (GOWN DISPOSABLE) ×4 IMPLANT
GOWN STRL REUS W/ TWL XL LVL3 (GOWN DISPOSABLE) ×6 IMPLANT
GOWN STRL REUS W/TWL 2XL LVL3 (GOWN DISPOSABLE) ×3 IMPLANT
GOWN STRL REUS W/TWL LRG LVL3 (GOWN DISPOSABLE) ×6
GOWN STRL REUS W/TWL XL LVL3 (GOWN DISPOSABLE) ×9
HEMOSTAT SURGICEL 2X14 (HEMOSTASIS) ×8 IMPLANT
KIT BASIN OR (CUSTOM PROCEDURE TRAY) ×3 IMPLANT
KIT SUCTION CATH 14FR (SUCTIONS) IMPLANT
KIT TURNOVER KIT B (KITS) ×3 IMPLANT
LOOP VESSEL SUPERMAXI WHITE (MISCELLANEOUS) IMPLANT
NDL HYPO 25GX1X1/2 BEV (NEEDLE) ×2 IMPLANT
NEEDLE 22X1 1/2 (OR ONLY) (NEEDLE) ×3 IMPLANT
NEEDLE HYPO 25GX1X1/2 BEV (NEEDLE) ×3 IMPLANT
NS IRRIG 1000ML POUR BTL (IV SOLUTION) ×9 IMPLANT
OBTURATOR OPTICAL STANDARD 8MM (TROCAR)
OBTURATOR OPTICAL STND 8 DVNC (TROCAR)
OBTURATOR OPTICALSTD 8 DVNC (TROCAR) IMPLANT
PACK CHEST (CUSTOM PROCEDURE TRAY) ×3 IMPLANT
PAD ARMBOARD 7.5X6 YLW CONV (MISCELLANEOUS) ×15 IMPLANT
PORT ACCESS TROCAR AIRSEAL 12 (TROCAR) ×2 IMPLANT
PORT ACCESS TROCAR AIRSEAL 5M (TROCAR) ×1
RELOAD STAPLE 45 2.0 GRY DVNC (STAPLE) IMPLANT
RELOAD STAPLE 45 2.5 WHT DVNC (STAPLE) IMPLANT
RELOAD STAPLE 45 3.5 BLU DVNC (STAPLE) IMPLANT
RELOAD STAPLER 2.5X45 WHT DVNC (STAPLE) ×6 IMPLANT
RELOAD STAPLER 3.5X45 BLU DVNC (STAPLE) ×14 IMPLANT
SEAL CANN UNIV 5-8 DVNC XI (MISCELLANEOUS) ×4 IMPLANT
SEAL XI 5MM-8MM UNIVERSAL (MISCELLANEOUS) ×6
SEALANT SURG COSEAL 4ML (VASCULAR PRODUCTS) IMPLANT
SEALANT SURG COSEAL 8ML (VASCULAR PRODUCTS) IMPLANT
SET TRI-LUMEN FLTR TB AIRSEAL (TUBING) ×3 IMPLANT
SOLUTION ELECTROLUBE (MISCELLANEOUS) IMPLANT
SPONGE INTESTINAL PEANUT (DISPOSABLE) IMPLANT
STAPLE RELOAD 45 2.0 GRAY (STAPLE) ×9
STAPLE RELOAD 45 2.0 GRAY DVNC (STAPLE) ×6 IMPLANT
STAPLER 45 SUREFORM CVD (STAPLE) ×3
STAPLER 45 SUREFORM CVD DVNC (STAPLE) IMPLANT
STAPLER CANNULA SEAL DVNC XI (STAPLE) ×4 IMPLANT
STAPLER CANNULA SEAL XI (STAPLE) ×6
STAPLER RELOAD 2.5X45 WHITE (STAPLE) ×9
STAPLER RELOAD 2.5X45 WHT DVNC (STAPLE) ×6
STAPLER RELOAD 3.5X45 BLU DVNC (STAPLE) ×14
STAPLER RELOAD 3.5X45 BLUE (STAPLE) ×21
STOPCOCK 4 WAY LG BORE MALE ST (IV SETS) ×3 IMPLANT
SUT MON AB 2-0 CT1 36 (SUTURE) IMPLANT
SUT PDS AB 1 CTX 36 (SUTURE) IMPLANT
SUT PROLENE 4 0 RB 1 (SUTURE)
SUT PROLENE 4-0 RB1 .5 CRCL 36 (SUTURE) IMPLANT
SUT SILK  1 MH (SUTURE) ×3
SUT SILK 1 MH (SUTURE) ×2 IMPLANT
SUT SILK 1 TIES 10X30 (SUTURE) IMPLANT
SUT SILK 2 0 SH (SUTURE) IMPLANT
SUT SILK 2 0SH CR/8 30 (SUTURE) IMPLANT
SUT VIC AB 1 CTX 36 (SUTURE)
SUT VIC AB 1 CTX36XBRD ANBCTR (SUTURE) IMPLANT
SUT VIC AB 2-0 CT1 27 (SUTURE) ×3
SUT VIC AB 2-0 CT1 TAPERPNT 27 (SUTURE) ×2 IMPLANT
SUT VIC AB 3-0 SH 27 (SUTURE) ×9
SUT VIC AB 3-0 SH 27X BRD (SUTURE) ×6 IMPLANT
SUT VICRYL 0 TIES 12 18 (SUTURE) ×3 IMPLANT
SUT VICRYL 0 UR6 27IN ABS (SUTURE) ×6 IMPLANT
SUT VICRYL 2 TP 1 (SUTURE) IMPLANT
SYR 10ML LL (SYRINGE) ×3 IMPLANT
SYR 20ML LL LF (SYRINGE) ×3 IMPLANT
SYR 50ML LL SCALE MARK (SYRINGE) ×3 IMPLANT
SYSTEM RETRIEVAL ANCHOR 8 (MISCELLANEOUS) ×1 IMPLANT
SYSTEM SAHARA CHEST DRAIN ATS (WOUND CARE) ×3 IMPLANT
TAPE CLOTH 4X10 WHT NS (GAUZE/BANDAGES/DRESSINGS) ×3 IMPLANT
TAPE PAPER 3X10 WHT MICROPORE (GAUZE/BANDAGES/DRESSINGS) ×1 IMPLANT
TIP APPLICATOR SPRAY EXTEND 16 (VASCULAR PRODUCTS) IMPLANT
TOWEL GREEN STERILE (TOWEL DISPOSABLE) ×3 IMPLANT
TRAY FOLEY MTR SLVR 16FR STAT (SET/KITS/TRAYS/PACK) ×3 IMPLANT
TROCAR BLADELESS 15MM (ENDOMECHANICALS) IMPLANT
TUBING EXTENTION W/L.L. (IV SETS) ×3 IMPLANT
WATER STERILE IRR 1000ML POUR (IV SOLUTION) ×3 IMPLANT

## 2021-06-29 NOTE — Anesthesia Procedure Notes (Signed)
Procedure Name: Intubation Date/Time: 06/29/2021 12:42 PM Performed by: Oleta Mouse, MD Pre-anesthesia Checklist: Patient identified, Emergency Drugs available, Suction available, Patient being monitored and Timeout performed Patient Re-evaluated:Patient Re-evaluated prior to induction Oxygen Delivery Method: Circle system utilized Preoxygenation: Pre-oxygenation with 100% oxygen Induction Type: IV induction Ventilation: Mask ventilation without difficulty Laryngoscope Size: Miller and 2 Grade View: Grade I Endobronchial tube: Left, Double lumen EBT, EBT position confirmed by fiberoptic bronchoscope and EBT position confirmed by auscultation and 35 Fr Number of attempts: 1 Airway Equipment and Method: Rigid stylet Placement Confirmation: ETT inserted through vocal cords under direct vision, positive ETCO2 and breath sounds checked- equal and bilateral Tube secured with: Tape Dental Injury: Teeth and Oropharynx as per pre-operative assessment

## 2021-06-29 NOTE — Brief Op Note (Signed)
06/29/2021  3:02 PM  PATIENT:  Jenny Cardenas  70 y.o. female  PRE-OPERATIVE DIAGNOSIS:  RIGHT UPPER LOBE PULMONARY NODULE  POST-OPERATIVE DIAGNOSIS:  ADENOCARCINOMA RUL  PROCEDURE: XI ROBOTIC ASSISTED RIGHT THORACOSCOPY,WEDGE RIGHT UPPER LOBE, RIGHT UPPER LOBECTOMY, LYMPH NODE DISSECTION, and INTERCOSTAL NERVE BLOCK  SURGEON:  Surgeon(s) and Role:    Lightfoot, Lucile Crater, MD - Primary  PHYSICIAN ASSISTANT: Lars Pinks PA-C  ANESTHESIA:   general  EBL:  100 mL   BLOOD ADMINISTERED:none  DRAINS:  49 Blake drain placed in the right pleural space    LOCAL MEDICATIONS USED:  OTHER Exparel  SPECIMEN:  Source of Specimen:  Wedge RUL, RUL, multiple lymph nodes . Wedge RUL showed Adenocarcinoma  DISPOSITION OF SPECIMEN:  PATHOLOGY  COUNTS CORRECT:  YES  DICTATION: .Dragon Dictation  PLAN OF CARE: Admit to inpatient   PATIENT DISPOSITION:  PACU - hemodynamically stable.   Delay start of Pharmacological VTE agent (>24hrs) due to surgical blood loss or risk of bleeding: no

## 2021-06-29 NOTE — Discharge Summary (Addendum)
Physician Discharge Summary       [address]       [place] [address]             [phone]    Patient ID: Jenny Cardenas MRN: [id number] DOB/AGE: [birth year] 70 y.o.  Admit date: 06/29/2021 Discharge date: 07/02/2021  Admission Diagnoses: Right upper lobe lung nodule Discharge Diagnoses:  S/p Xi robotic assisted right VATS, wedge RUL, RUL, lymph node dissection, and intercostal nerve block Adenocarcinoma RUL 3. History of the following: Diagnosis Date   Anxiety     Arthritis     Carpal tunnel syndrome     Cataract     Depression     Diabetes mellitus without complication ([place])      Type 2   Fall April 10, 2015   GERD (gastroesophageal reflux disease)     Goiter     History of shingles      On abdomen, left ring finger, and left leg; Pt takes Valtrex   Hyperlipidemia     Hypertension     Obesity     PTSD (post-traumatic stress disorder)     Shortness of breath dyspnea      pt stated its related to the back problems she has   Thyroid disease     Ulcer        Consults: None  Procedure (s):  XI ROBOTIC ASSISTED RIGHT THORACOSCOPY,WEDGE RIGHT UPPER LOBE, RIGHT UPPER LOBECTOMY, LYMPH NODE DISSECTION, and INTERCOSTAL NERVE BLOCK by Dr. Kipp Brood on 06/29/2021.  Pathology: Final pathology results pending  HPI: This is a 70 year old female referred by Dr. Lamonte Sakai for surgical evaluation of a right upper lobe groundglass opacity that is slowly been growing serial imaging.  She underwent a PET/CT which did not show any significant uptake, but given the size and characteristics there is concern that this could be a primary lung cancer.  The nodule was originally identified in 2020, and at that time measured 1.2 cm.  On most recent imaging, the nodule  now measures 1.6 cm. Dr. Kipp Brood discussed the need for a tissue sample via navigational bronchoscopy prior to surgery. Dr. Kipp Brood spoke  with Dr. Lamonte Sakai to schedule a time for this patient to undergo a  combination procedure and the date was scheduled for 06/29/2021. Dr.Lightfoot discussed with the patient that if the biopsy results are positive then she will undergo a right upper lobectomy performed robotically.  If the biopsy results are indeterminate, then we will plan for a generous wedge resection and possible lobectomy. Potential risks, benefits, and complications of the surgery were discussed with the patient and she agreed to proceed with surgery.  Hospital Course: Patient underwent a Xi robotic assisted right VATS, wedge RUL, RUL, lymph node dissection, and intercostal nerve block on 06/29/2021. She was extubated and transferred to PACU in stable condition.Chest tube was to water seal and there was no air leak. CXR on 11/18 showed minimal right apical pneumothorax. Dr. Kipp Brood had chest tube removed on 11/18. Patient has been tolerating a diet. She was weaned to room air. Her wounds are clean, dry, and healing without signs of infection. She got hypotensive and required a fluid bolus. CXR sone 11/19 showed stable appearance of trace apical space.  Repeat CXR showed improvement of apical space and sub q emphysema.  She had an episode of severe pain overnight and she was unable to move.  She described this as a cramping pain.  The patient takes Zanaflex at home  and this was reordered. Her surgical incisions are healing without evidence of infection.  She is medically stable for discharge home today.   PATHOLOGY IS PENDING  Latest Vital Signs: Blood pressure 129/68, pulse 78, temperature 98.2 F (36.8 C), temperature source Oral, resp. rate (!) 22, height 5\' 2"  (1.575 m), weight 72.6 kg, SpO2 90 %.  General appearance: alert, cooperative, and no distress Heart: regular rate and rhythm Lungs: clear to auscultation bilaterally Abdomen: soft, non-tender; bowel sounds normal; no masses,  no organomegaly Extremities: extremities normal, atraumatic, no cyanosis or edema Wound: clean and  dry  Discharge Condition:Stable and discharged to home.  Recent laboratory studies:  Lab Results  Component Value Date   WBC 10.2 07/01/2021   HGB 10.2 (L) 07/01/2021   HCT 32.7 (L) 07/01/2021   MCV 89.3 07/01/2021   PLT 222 07/01/2021   Lab Results  Component Value Date   NA 138 07/01/2021   K 4.0 07/01/2021   CL 105 07/01/2021   CO2 29 07/01/2021   CREATININE 1.09 (H) 07/01/2021   GLUCOSE 142 (H) 07/01/2021      Diagnostic Studies: DG Chest 2 View  Result Date: 07/01/2021 CLINICAL DATA:  Pneumothorax. EXAM: CHEST - 2 VIEW COMPARISON:  06/30/2021 FINDINGS: Right chest tube is been removed in the interval. Small apical right pneumothorax evident. Basilar atelectasis noted bilaterally. Subcutaneous emphysema in the right chest wall is consistent with recent chest tube. Bones are diffusely demineralized. Telemetry leads overlie the chest. IMPRESSION: Interval removal of right chest tube with small right apical pneumothorax. Atelectasis again noted left lung base. Electronically Signed   By: Misty Stanley M.D.   On: 07/01/2021 10:30   DG Chest 2 View  Result Date: 06/28/2021 CLINICAL DATA:  Preop.  Pulmonary nodule.  Diabetes. EXAM: CHEST - 2 VIEW COMPARISON:  Chest CT 03/29/2021 and chest radiographs 06/01/2015 FINDINGS: The cardiomediastinal silhouette is within normal limits. The lungs are well inflated without evidence of airspace consolidation, edema, pleural effusion, or pneumothorax. The right upper lobe ground-glass nodule on CT is not well seen radiographically. Previously augmented thoracic compression fractures are again noted. IMPRESSION: No active cardiopulmonary disease. Electronically Signed   By: Logan Bores M.D.   On: 06/28/2021 09:04   DG CHEST PORT 1 VIEW  Result Date: 06/30/2021 CLINICAL DATA:  Chest tube. EXAM: PORTABLE CHEST 1 VIEW COMPARISON:  06/29/2021 FINDINGS: Right chest tube remains in place extending into the lung apex. Minimal right pneumothorax Mild  left lower lobe airspace disease unchanged. No left effusion. Negative for heart failure Thoracic fractures.  Three level vertebroplasty cement unchanged. IMPRESSION: Right chest tube in place.  Minimal right pneumothorax Left lower lobe airspace disease unchanged. Electronically Signed   By: Franchot Gallo M.D.   On: 06/30/2021 08:37   DG Chest Port 1 View  Result Date: 06/29/2021 CLINICAL DATA:  Status post right upper lobe wedge resection EXAM: PORTABLE CHEST 1 VIEW COMPARISON:  Chest radiograph obtained 06/27/2021, chest CT at 17:22 FINDINGS: The cardiomediastinal silhouette is stable. Postsurgical changes are noted in the right hemithorax with a right apical chest tube. There is a small apical pneumothorax. Patchy opacity in the left base likely reflects atelectasis. The bones are stable, including kyphoplasty at multiple levels. IMPRESSION: Status post right upper lobe brain resection and right apical chest tube placement with a small right apical pneumothorax. Electronically Signed   By: Valetta Mole M.D.   On: 06/29/2021 16:53   CT Super D Chest Wo Contrast  Result Date: 06/29/2021  CLINICAL DATA:  Pre bronchoscopy planning. EXAM: CT CHEST WITHOUT CONTRAST TECHNIQUE: Multidetector CT imaging of the chest was performed using thin slice collimation for electromagnetic bronchoscopy planning purposes, without intravenous contrast. COMPARISON:  04/27/2021 FINDINGS: Cardiovascular: Heart size is normal. Aortic atherosclerosis and coronary artery calcifications. No pericardial effusion. Mediastinum/Nodes: No enlarged mediastinal, hilar, or axillary lymph nodes. Thyroid gland, trachea, and esophagus demonstrate no significant findings. Lungs/Pleura: Multiple lung nodules are again noted bilaterally, including: Pure ground-glass nodule within the posterior right upper lobe is again noted 1.6 cm, image 55/4. Unchanged from previous exam. Subpleural nodule in the periphery of the right middle lobe is unchanged  measuring 5 mm, image 86/4. Nodule within the lateral right lung base is stable measuring 5 mm, image 125/4. Tiny nodule within the medial right apex is stable measuring 3 mm, image 29/4. Within the medial right upper lobe there is a pure ground-glass nodule measuring 4 mm, image 35/4. Unchanged from previous exam. Stable solid nodule within the right lower lobe measuring 4 mm, image 79/4. Upper Abdomen: No acute abnormality. Musculoskeletal: No chest wall mass or suspicious bone lesions identified. Treated compression deformities the are again noted at T3, T8 and T11. Multilevel disc space narrowing and endplate spurring noted throughout the thoracic spine. IMPRESSION: 1. No acute cardiopulmonary abnormalities. 2. Unchanged appearance of bilateral pulmonary nodules including the dominant nodule within the right upper lobe which is purely ground-glass in appearance measuring 1.6 cm. 3. Aortic Atherosclerosis (ICD10-I70.0). Electronically Signed   By: Kerby Moors M.D.   On: 06/29/2021 12:01   DG C-ARM BRONCHOSCOPY  Result Date: 06/29/2021 C-ARM BRONCHOSCOPY: Fluoroscopy was utilized by the requesting physician.  No radiographic interpretation.      Discharge Medications: Allergies as of 07/02/2021       Reactions   Other    Etholine Oxide: causes itching and hives, and temporary blindness        Medication List     TAKE these medications    albuterol (2.5 MG/3ML) 0.083% nebulizer solution Commonly known as: PROVENTIL Take 2.5 mg by nebulization every 4 (four) hours as needed for wheezing or shortness of breath.   ALPRAZolam 0.5 MG tablet Commonly known as: XANAX Take 1 tablet (0.5 mg total) by mouth 3 (three) times daily as needed for anxiety.   amphetamine-dextroamphetamine 10 MG tablet Commonly known as: ADDERALL Take 1 tablet (10 mg total) by mouth every morning.   buPROPion 150 MG 24 hr tablet Commonly known as: WELLBUTRIN XL Take 150 mg by mouth daily.   cycloSPORINE  0.05 % ophthalmic emulsion Commonly known as: RESTASIS Place 1 drop into both eyes 2 (two) times daily.   diphenhydramine-acetaminophen 25-500 MG Tabs tablet Commonly known as: TYLENOL PM Take 2 tablets by mouth at bedtime as needed.   furosemide 20 MG tablet Commonly known as: LASIX Take 20 mg by mouth daily. May take an additional dose if needed for swelling   guaiFENesin 600 MG 12 hr tablet Commonly known as: MUCINEX Take 1 tablet (600 mg total) by mouth 2 (two) times daily.   levothyroxine 25 MCG tablet Commonly known as: SYNTHROID Take 25 mcg by mouth daily before breakfast.   lidocaine 5 % Commonly known as: LIDODERM Place 1 patch onto the skin daily as needed (irritation). Remove & Discard patch within 12 hours or as directed by MD   pantoprazole 40 MG tablet Commonly known as: PROTONIX Take 40 mg by mouth daily.   phenol 1.4 % Liqd Commonly known as: CHLORASEPTIC Use as  directed 1 spray in the mouth or throat as needed for throat irritation / pain.   pravastatin 10 MG tablet Commonly known as: PRAVACHOL Take 10 mg by mouth in the morning and at bedtime.   PRESERVISION AREDS 2 PO Take 1 capsule by mouth in the morning and at bedtime.   sertraline 50 MG tablet Commonly known as: ZOLOFT Take 100 mg by mouth daily.   tiZANidine 4 MG tablet Commonly known as: ZANAFLEX Take 1 tablet (4 mg total) by mouth every 8 (eight) hours as needed for muscle spasms. What changed:  when to take this reasons to take this   traMADol 50 MG tablet Commonly known as: ULTRAM Take 1 tablet (50 mg total) by mouth every 6 (six) hours as needed (mild pain).        Follow Up Appointments:  Follow-up Information     Lajuana Matte, MD. Go on 07/14/2021.   Specialty: Cardiothoracic Surgery Why: Appointment time is at 11:20 am Contact information: [address] [place] [address] [phone]                 Signed: Ellamae Sia 07/02/2021, 7:31 AM

## 2021-06-29 NOTE — Op Note (Signed)
[address]       [place] [address]             [phone]        06/29/2021  Patient:  Jenny Cardenas Pre-Op Dx: Right upper lobe pulmonary nodule Post-op Dx: Right upper lobe non-small cell lung cancer Procedure: - Robotic assisted right video thoracoscopy -Right upper lobe wedge resection -Right upper lobectomy - Mediastinal lymph node sampling - Intercostal nerve block  Surgeon and Role:      * Azelia Reiger, Lucile Crater, MD - Primary    *D. Ezekiel Slocumb-  An experienced assistant was required given the complexity of this surgery and the standard of surgical care. The assistant was needed for exposure, dissection, suctioning, retraction of delicate tissues and sutures, instrument exchange and for overall help during this procedure.   Anesthesia  general EBL: 100 ml Blood Administration: None Specimen: Right upper lobe wedge resection.  Right upper lobe.  Level 9 lymph nodes.  Level 7 lymph nodes.  Hilar lymph nodes.  Drains: 64 F argyle chest tube in right chest Counts: correct   Indications: 70 year old female with a right upper lobe groundglass opacity.  I personally reviewed the cross-sectional imaging which included the CT scan and the PET/CT.  There is no significant uptake on PET/CT.  The groundglass opacity is quite deep in the lung thus obtaining a tissue sample via navigational bronchoscopy prior to surgery will be ideal.  I will speak with Dr. Lamonte Sakai to schedule a time for this patient to undergo a combination procedure.  If the biopsy results are positive then she will undergo a right upper lobectomy performed robotically.  If the biopsy results are indeterminate then we will plan for a generous wedge resection and possible lobectomy  Findings: The ICG marking was evident.  A wedge resection was performed.  Frozen section showed non-small cell lung cancer.  Completion lobectomy was then performed.  Operative Technique: After the risks,  benefits and alternatives were thoroughly discussed, the patient was brought to the operative theatre.  Anesthesia was induced, and the patient was then placed in a left lateral decubitus position and was prepped and draped in normal sterile fashion.  An appropriate surgical pause was performed, and pre-operative antibiotics were dosed accordingly.  We began by placing our 4 robotic ports in the the 7th intercostal space targeting the hilum of the lung.  A 39mm assistant port was placed in the 9th intercostal space in the anterior axillary line.  The robot was then docked and all instruments were passed under direct visualization.    The lung was then retracted superiorly, and the inferior pulmonary ligament was divided.  The hilum was mobilized anteriorly and posteriorly.  We identified the upper lobe vein, and after careful isolation, it was divided with a vascular stapler.  We next moved to the pulmonary artery.  The artery was then divided with a vascular load stapler.  The bronchus to the upper lobe was then isolated.  After a test clamp, with good ventilation of the middle and lower lobes, the bronchus was then divided.  The fissure was completed, and the specimen was passed into an endocatch bag.  It was removed from the anterior access site.    Lymph nodes were then sampled at levels 7, 9, and hilum.  The chest was irrigated, and an air leak test was performed.  An intercostal nerve block was performed under direct visualization.  A 28 F chest  with then placed, and we watch the remaining lobes re-expand.  The skin and soft tissue were closed with absorbable suture    The patient tolerated the procedure without any immediate complications, and was transferred to the PACU in stable condition.  Jenny Cardenas

## 2021-06-29 NOTE — Interval H&P Note (Signed)
History and Physical Interval Note:  06/29/2021 10:35 AM  Jenny Cardenas  has presented today for surgery, with the diagnosis of lung mass.  The various methods of treatment have been discussed with the patient and family. After consideration of risks, benefits and other options for treatment, the patient has consented to  Procedure(s): ELECTROMAGNETIC NAVIGATION BRONCHOSCOPY (N/A) as a surgical intervention.  The patient's history has been reviewed, patient examined, no change in status, stable for surgery.  I have reviewed the patient's chart and labs.  Questions were answered to the patient's satisfaction.     Collene Gobble

## 2021-06-29 NOTE — Anesthesia Preprocedure Evaluation (Addendum)
Anesthesia Evaluation  Patient identified by MRN, date of birth, ID band Patient awake    Reviewed: Allergy & Precautions, NPO status , Patient's Chart, lab work & pertinent test results  History of Anesthesia Complications Negative for: history of anesthetic complications  Airway Mallampati: II  TM Distance: >3 FB Neck ROM: Full    Dental  (+) Dental Advisory Given, Teeth Intact   Pulmonary shortness of breath, former smoker,  PULMONARY NODULE   breath sounds clear to auscultation       Cardiovascular hypertension, (-) angina(-) Past MI and (-) CHF  Rhythm:Regular     Neuro/Psych PSYCHIATRIC DISORDERS Anxiety Depression  Neuromuscular disease    GI/Hepatic Neg liver ROS, GERD  Medicated and Controlled,  Endo/Other  diabetesHypothyroidism   Renal/GU CRFRenal diseaseLab Results      Component                Value               Date                      CREATININE               1.12 (H)            06/27/2021                Musculoskeletal  (+) Arthritis ,   Abdominal   Peds  Hematology negative hematology ROS (+) Lab Results      Component                Value               Date                      WBC                      9.4                 06/27/2021                HGB                      13.0                06/27/2021                HCT                      40.1                06/27/2021                MCV                      86.1                06/27/2021                PLT                      291                 06/27/2021              Anesthesia Other Findings   Reproductive/Obstetrics  Anesthesia Physical Anesthesia Plan  ASA: 3  Anesthesia Plan: General   Post-op Pain Management:    Induction: Intravenous  PONV Risk Score and Plan: 3 and Ondansetron, Dexamethasone and Propofol infusion  Airway Management Planned: Oral ETT and Double Lumen  EBT  Additional Equipment:   Intra-op Plan:   Post-operative Plan: Extubation in OR  Informed Consent: I have reviewed the patients History and Physical, chart, labs and discussed the procedure including the risks, benefits and alternatives for the proposed anesthesia with the patient or authorized representative who has indicated his/her understanding and acceptance.     Dental advisory given  Plan Discussed with: Anesthesiologist and CRNA  Anesthesia Plan Comments: (Clearsight, ETT for bronch followed by Double LT for vats)        Anesthesia Quick Evaluation

## 2021-06-29 NOTE — Interval H&P Note (Signed)
History and Physical Interval Note:  06/29/2021 10:52 AM  Jenny Cardenas  has presented today for surgery, with the diagnosis of PULMONARY NODULE.  The various methods of treatment have been discussed with the patient and family. After consideration of risks, benefits and other options for treatment, the patient has consented to  Procedure(s): XI ROBOTIC ASSISTED THORASCOPY-RIGHT UPPER LOBE WEDGE RESECTION, possible lobectomy (Right) as a surgical intervention.  The patient's history has been reviewed, patient examined, no change in status, stable for surgery.  I have reviewed the patient's chart and labs.  Questions were answered to the patient's satisfaction.     Johnnell Liou Bary Leriche

## 2021-06-29 NOTE — Hospital Course (Addendum)
HPI: This is a 70 year old female referred by Dr. Lamonte Cardenas for surgical evaluation of a right upper lobe groundglass opacity that is slowly been growing serial imaging.  She underwent a PET/CT which did not show any significant uptake, but given the size and characteristics there is concern that this could be a primary lung cancer.  The nodule was originally identified in 2020, and at that time measured 1.2 cm.  On most recent imaging, the nodule  now measures 1.6 cm. Dr. Kipp Cardenas discussed the need for a tissue sample via navigational bronchoscopy prior to surgery. Dr. Kipp Cardenas spoke  with Dr. Lamonte Cardenas to schedule a time for this patient to undergo a combination procedure and the date was scheduled for 06/29/2021. Dr.Lightfoot discussed with the patient that if the biopsy results are positive then she will undergo a right upper lobectomy performed robotically.  If the biopsy results are indeterminate, then we will plan for a generous wedge resection and possible lobectomy. Potential risks, benefits, and complications of the surgery were discussed with the patient and she agreed to proceed with surgery.  Hospital Course: Patient underwent a Xi robotic assisted right VATS, wedge RUL, RUL, lymph node dissection, and intercostal nerve block on 06/29/2021. She was extubated and transferred to PACU in stable condition.Chest tube was to water seal and there was no air leak. CXR on 11/18 showed minimal right apical pneumothorax. Dr. Kipp Cardenas had chest tube removed on 11/18. Patient has been tolerating a diet. She was weaned to room air. Her wounds are clean, dry, and healing without signs of infection. She got hypotensive and required a fluid bolus. CXR sone 11/19 showed stable appearance of trace apical space.  Repeat CXR showed improvement of apical space and sub q emphysema.  She had an episode of severe pain overnight and she was unable to move.  She described this as a cramping pain.  The patient takes Zanaflex at  home and this was reordered. Her surgical incisions are healing without evidence of infection.  She is medically stable for discharge home today.

## 2021-06-29 NOTE — Anesthesia Procedure Notes (Signed)
Procedure Name: Intubation Date/Time: 06/29/2021 11:42 AM Performed by: Trinna Post., CRNA Pre-anesthesia Checklist: Patient identified, Emergency Drugs available, Suction available and Patient being monitored Patient Re-evaluated:Patient Re-evaluated prior to induction Oxygen Delivery Method: Circle system utilized Preoxygenation: Pre-oxygenation with 100% oxygen Induction Type: IV induction Ventilation: Mask ventilation without difficulty Laryngoscope Size: Mac and 4 Grade View: Grade I Tube type: Oral Tube size: 7.0 mm Number of attempts: 1 Airway Equipment and Method: Stylet and Oral airway Placement Confirmation: ETT inserted through vocal cords under direct vision, positive ETCO2 and breath sounds checked- equal and bilateral Secured at: 22 cm Tube secured with: Tape Dental Injury: Teeth and Oropharynx as per pre-operative assessment

## 2021-06-29 NOTE — Op Note (Signed)
Video Bronchoscopy with Robotic Assisted Bronchoscopic Navigation   Date of Operation: 06/29/2021   Pre-op Diagnosis: Right upper lobe pulmonary nodule  Post-op Diagnosis: Same  Surgeon: None  Assistants: Baltazar Apo  Anesthesia: General endotracheal anesthesia  Operation: Flexible video fiberoptic bronchoscopy with robotic assistance and biopsies.  Estimated Blood Loss: Minimal  Complications: None  Indications and History: Jenny Cardenas is a 70 y.o. female with history of minimal tobacco use, diabetes, hypertension.  She was found to have right upper lobe groundglass opacity which we have followed with serial imaging.  This was enlarging on CT scan.  PET scan 04/27/2021 with no hypermetabolic activity.  She is here today for robotic assisted navigational bronchoscopy, dye marking to facilitate robotic VATS resection. The risks, benefits, complications, treatment options and expected outcomes were discussed with the patient.  The possibilities of pneumothorax, pneumonia, reaction to medication, pulmonary aspiration, perforation of a viscus, bleeding, failure to diagnose a condition and creating a complication requiring transfusion or operation were discussed with the patient who freely signed the consent.    Description of Procedure: The patient was seen in the Preoperative Area, was examined and was deemed appropriate to proceed.  The patient was taken to [facility] endoscopy room 3, identified as Jenny Cardenas and the procedure verified as Flexible Video Fiberoptic Bronchoscopy.  A Time Out was held and the above information confirmed.   Prior to the date of the procedure a high-resolution CT scan of the chest was performed. Utilizing ION software program a virtual tracheobronchial tree was generated to allow the creation of distinct navigation pathways to the patient's parenchymal abnormalities. After being taken to the operating room general anesthesia was initiated and the patient  was orally  intubated. The video fiberoptic bronchoscope was introduced via the endotracheal tube and a general inspection was performed which showed normal right and left lung anatomy, aspiration of the bilateral mainstems was completed to remove any remaining secretions. Robotic catheter inserted into patient's endotracheal tube.   Target #1 right upper lobe groundglass pulmonary nodule: The distinct navigation pathways prepared prior to this procedure were then utilized to navigate to patient's lesion identified on CT scan. The robotic catheter was secured into place and the vision probe was withdrawn.  Lesion location was approximated using fluoroscopy and radial endobronchial ultrasound for peripheral targeting.  Local registration and targeting was performed using Cios three-dimensional imaging.  Under fluoroscopic guidance transbronchial needle brushings, transbronchial needle biopsies were performed to be sent for cytology and pathology.  1.5 cc of methylene blue/isocyanate green was injected into the nodule via transbronchial needle under fluoroscopic guidance.   At the end of the procedure a general airway inspection was performed and there was no evidence of active bleeding. The bronchoscope was removed.  The patient tolerated the procedure well.  She will proceed to the OR for her robotic VATS resection.  Please refer also to Dr. Abran Duke notes.  Samples Target #1: 1. Transbronchial needle brushings from right upper lobe nodule 2. Transbronchial Wang needle biopsies from right upper lobe nodule   Plans:  The patient will transfer now to the OR for robotic VATS resection.   Baltazar Apo, MD, PhD 06/29/2021, 12:32 PM [facility] [phone] or if no answer before 7:00PM call [phone] For any issues after 7:00PM please call eLink [phone]

## 2021-06-29 NOTE — Transfer of Care (Signed)
Immediate Anesthesia Transfer of Care Note  Patient: Jenny Cardenas  Procedure(s) Performed: XI ROBOTIC ASSISTED THORASCOPY-RIGHT UPPER LOBE WEDGE RESECTION (Right: Chest) RIGHT UPPER LOBECTOMY INTERCOSTAL NERVE BLOCK LYMPH NODE DISSECTION  Patient Location: PACU  Anesthesia Type:General  Level of Consciousness: awake and drowsy  Airway & Oxygen Therapy: Patient Spontanous Breathing and Patient connected to face mask oxygen  Post-op Assessment: Report given to RN and Post -op Vital signs reviewed and stable  Post vital signs: Reviewed and stable  Last Vitals:  Vitals Value Taken Time  BP 126/57 06/29/21 1531  Temp    Pulse 81 06/29/21 1533  Resp 19 06/29/21 1533  SpO2 97 % 06/29/21 1533  Vitals shown include unvalidated device data.  Last Pain:  Vitals:   06/29/21 1030  TempSrc:   PainSc: 0-No pain         Complications: No notable events documented.

## 2021-06-30 ENCOUNTER — Inpatient Hospital Stay (HOSPITAL_COMMUNITY): Payer: Medicare Other

## 2021-06-30 ENCOUNTER — Encounter (HOSPITAL_COMMUNITY): Payer: Self-pay | Admitting: Thoracic Surgery (Cardiothoracic Vascular Surgery)

## 2021-06-30 LAB — BASIC METABOLIC PANEL
Anion gap: 7 (ref 5–15)
BUN: 9 mg/dL (ref 8–23)
CO2: 29 mmol/L (ref 22–32)
Calcium: 8.4 mg/dL — ABNORMAL LOW (ref 8.9–10.3)
Chloride: 103 mmol/L (ref 98–111)
Creatinine, Ser: 0.97 mg/dL (ref 0.44–1.00)
GFR, Estimated: >60 mL/min (ref >60)
Glucose, Bld: 145 mg/dL — ABNORMAL HIGH (ref 70–99)
Potassium: 3.3 mmol/L — ABNORMAL LOW (ref 3.5–5.1)
Sodium: 139 mmol/L (ref 135–145)

## 2021-06-30 LAB — CBC
HCT: 33.7 % — ABNORMAL LOW (ref 36.0–46.0)
Hemoglobin: 10.8 g/dL — ABNORMAL LOW (ref 12.0–15.0)
MCH: 27.7 pg (ref 26.0–34.0)
MCHC: 32 g/dL (ref 30.0–36.0)
MCV: 86.4 fL (ref 80.0–100.0)
Platelets: 228 10*3/uL (ref 150–400)
RBC: 3.9 MIL/uL (ref 3.87–5.11)
RDW: 13.1 % (ref 11.5–15.5)
WBC: 13.4 10*3/uL — ABNORMAL HIGH (ref 4.0–10.5)
nRBC: 0 % (ref 0.0–0.2)

## 2021-06-30 LAB — CYTOLOGY - NON PAP

## 2021-06-30 LAB — GLUCOSE, CAPILLARY: Glucose-Capillary: 140 mg/dL — ABNORMAL HIGH (ref 70–99)

## 2021-06-30 MED ORDER — TRAMADOL HCL 50 MG PO TABS: 50.0000 mg | ORAL_TABLET | Freq: Four times a day (QID) | ORAL | 0 refills | Status: DC | PRN

## 2021-06-30 MED ORDER — POTASSIUM CHLORIDE CRYS ER 20 MEQ PO TBCR
40.0000 meq | EXTENDED_RELEASE_TABLET | Freq: Once | ORAL | Status: AC
  Administered 2021-06-30: 40 meq via ORAL
  Filled 2021-06-30: qty 2

## 2021-06-30 MED ORDER — TIZANIDINE HCL 4 MG PO TABS: 4.0000 mg | ORAL_TABLET | Freq: Every evening | ORAL | Status: DC | PRN

## 2021-06-30 MED ORDER — SODIUM CHLORIDE 0.9 % IV BOLUS
500.0000 mL | Freq: Once | INTRAVENOUS | Status: AC
  Administered 2021-06-30: 500 mL via INTRAVENOUS

## 2021-06-30 MED ORDER — POTASSIUM CHLORIDE CRYS ER 20 MEQ PO TBCR
20.0000 meq | EXTENDED_RELEASE_TABLET | ORAL | Status: DC
  Administered 2021-06-30: 20 meq via ORAL
  Filled 2021-06-30: qty 1

## 2021-06-30 MED ORDER — ORAL CARE MOUTH RINSE
15.0000 mL | Freq: Two times a day (BID) | OROMUCOSAL | Status: DC
  Administered 2021-06-30 – 2021-07-02 (×5): 15 mL via OROMUCOSAL

## 2021-06-30 NOTE — Plan of Care (Signed)
  Problem: Education: Goal: Knowledge of General Education information will improve Description: Including pain rating scale, medication(s)/side effects and non-pharmacologic comfort measures Outcome: Progressing   Problem: Health Behavior/Discharge Planning: Goal: Ability to manage health-related needs will improve Outcome: Progressing   Problem: Clinical Measurements: Goal: Ability to maintain clinical measurements within normal limits will improve Outcome: Progressing Goal: Will remain free from infection Outcome: Progressing Goal: Diagnostic test results will improve Outcome: Progressing Goal: Respiratory complications will improve Outcome: Progressing Goal: Cardiovascular complication will be avoided Outcome: Progressing   Problem: Activity: Goal: Risk for activity intolerance will decrease Outcome: Progressing   Problem: Nutrition: Goal: Adequate nutrition will be maintained Outcome: Progressing   Problem: Coping: Goal: Level of anxiety will decrease Outcome: Progressing   Problem: Elimination: Goal: Will not experience complications related to bowel motility Outcome: Progressing Goal: Will not experience complications related to urinary retention Outcome: Progressing   Problem: Safety: Goal: Ability to remain free from injury will improve Outcome: Progressing   Problem: Skin Integrity: Goal: Risk for impaired skin integrity will decrease Outcome: Progressing   Problem: Education: Goal: Knowledge of disease or condition will improve Outcome: Progressing Goal: Knowledge of the prescribed therapeutic regimen will improve Outcome: Progressing   Problem: Activity: Goal: Risk for activity intolerance will decrease Outcome: Progressing   Problem: Cardiac: Goal: Will achieve and/or maintain hemodynamic stability Outcome: Progressing   Problem: Clinical Measurements: Goal: Postoperative complications will be avoided or minimized Outcome: Progressing    Problem: Respiratory: Goal: Respiratory status will improve Outcome: Progressing   Problem: Skin Integrity: Goal: Wound healing without signs and symptoms infection will improve Outcome: Progressing   Problem: Pain Managment: Goal: General experience of comfort will improve Outcome: Not Progressing   Problem: Pain Management: Goal: Pain level will decrease Outcome: Not Progressing

## 2021-06-30 NOTE — Anesthesia Postprocedure Evaluation (Signed)
Anesthesia Post Note  Patient: Jenny Cardenas  Procedure(s) Performed: XI ROBOTIC ASSISTED THORASCOPY-RIGHT UPPER LOBE WEDGE RESECTION (Right: Chest) RIGHT UPPER LOBECTOMY INTERCOSTAL NERVE BLOCK LYMPH NODE DISSECTION     Patient location during evaluation: PACU Anesthesia Type: General Level of consciousness: awake and alert Pain management: pain level controlled Vital Signs Assessment: post-procedure vital signs reviewed and stable Respiratory status: spontaneous breathing, nonlabored ventilation, respiratory function stable and patient connected to nasal cannula oxygen Cardiovascular status: blood pressure returned to baseline and stable Postop Assessment: no apparent nausea or vomiting Anesthetic complications: no   No notable events documented.  Last Vitals:  Vitals:   06/30/21 0328 06/30/21 1000  BP: (!) 92/54 (!) 104/57  Pulse: 80 80  Resp: 19 17  Temp: 36.8 C   SpO2: 95% 94%    Last Pain:  Vitals:   06/30/21 0458  TempSrc:   PainSc: 6                  Lyndon Chenoweth

## 2021-06-30 NOTE — Discharge Instructions (Signed)
Robot-Assisted Thoracic Surgery, Care After The following information offers guidance on how to care for yourself after your procedure. Your health care provider may also give you more specific instructions. If you have problems or questions, contact your health care provider. What can I expect after the procedure? After the procedure, it is common to have: Some pain and aches in the area of your surgical incisions. Pain when breathing in (inhaling) and coughing. Tiredness (fatigue). Trouble sleeping. Constipation. Follow these instructions at home: Medicines Take over-the-counter and prescription medicines only as told by your health care provider. If you were prescribed an antibiotic medicine, take it as told by your health care provider. Do not stop taking the antibiotic even if you start to feel better. Talk with your health care provider about safe and effective ways to manage pain after your procedure. Pain management should fit your specific health needs. Take pain medicine before pain becomes severe. Relieving and controlling your pain will make breathing easier for you. Ask your health care provider if the medicine prescribed to you requires you to avoid driving or using machinery. Eating and drinking Follow instructions from your health care provider about eating or drinking restrictions. These will vary depending on what procedure you had. Your health care provider may recommend: A liquid diet or soft diet for the first few days. Meals that are smaller and more frequent. A diet of fruits, vegetables, whole grains, and low-fat proteins. Limiting foods that are high in fat and processed sugar, including fried or sweet foods. Incision care Follow instructions from your health care provider about how to take care of your incisions. Make sure you: Wash your hands with soap and water for at least 20 seconds before and after you change your bandage (dressing). If soap and water are not  available, use hand sanitizer. Change your dressing as told by your health care provider. Leave stitches (sutures), skin glue, or adhesive strips in place. These skin closures may need to stay in place for 2 weeks or longer. If adhesive strip edges start to loosen and curl up, you may trim the loose edges. Do not remove adhesive strips completely unless your health care provider tells you to do that. Check your incision area every day for signs of infection. Check for: Redness, swelling, or more pain. Fluid or blood. Warmth. Pus or a bad smell. Activity Return to your normal activities as told by your health care provider. Ask your health care provider what activities are safe for you. Ask your health care provider when it is safe for you to drive. Do not lift anything that is heavier than 10 lb (4.5 kg), or the limit that you are told, until your health care provider says that it is safe. Rest as told by your health care provider. Avoid sitting for a long time without moving. Get up to take short walks every 1-2 hours. This is important to improve blood flow and breathing. Ask for help if you feel weak or unsteady. Do exercises as told by your health care provider. Pneumonia prevention Do deep breathing exercises and cough regularly as directed. This helps clear mucus and opens your lungs. Doing this helps prevent lung infection (pneumonia). If you were given an incentive spirometer, use it as told. An incentive spirometer is a tool that measures how well you are filling your lungs with each breath. Coughing may hurt less if you try to support your chest. This is called splinting. Try one of these when you cough:  Hold a pillow against your chest. Place the palms of both hands on top of your incision area. Do not use any products that contain nicotine or tobacco. These products include cigarettes, chewing tobacco, and vaping devices, such as e-cigarettes. If you need help quitting, ask your  health care provider. Avoid secondhand smoke. General instructions If you have a drainage tube: Follow instructions from your health care provider about how to take care of it. Do not travel by airplane after your tube is removed until your health care provider tells you it is safe. You may need to take these actions to prevent or treat constipation: Drink enough fluid to keep your urine pale yellow. Take over-the-counter or prescription medicines. Eat foods that are high in fiber, such as beans, whole grains, and fresh fruits and vegetables. Limit foods that are high in fat and processed sugars, such as fried or sweet foods. Keep all follow-up visits. This is important. Contact a health care provider if: You have redness, swelling, or more pain around an incision. You have fluid or blood coming from an incision. An incision feels warm to the touch. You have pus or a bad smell coming from an incision. You have a fever. You cannot eat or drink without vomiting. Your pain medicine is not controlling your pain. Get help right away if: You have chest pain. Your heart is beating quickly. You have trouble breathing. You have trouble speaking. You are confused. You feel weak or dizzy, or you faint. These symptoms may represent a serious problem that is an emergency. Do not wait to see if the symptoms will go away. Get medical help right away. Call your local emergency services (911 in the [place] Do not drive yourself to the hospital. Summary Talk with your health care provider about safe and effective ways to manage pain after your procedure. Pain management should fit your specific health needs. Return to your normal activities as told by your health care provider. Ask your health care provider what activities are safe for you. Do deep breathing exercises and cough regularly as directed. This helps to clear mucus and prevent pneumonia. If it hurts to cough, ease pain by holding a pillow  against your chest or by placing the palms of both hands over your incisions. This information is not intended to replace advice given to you by your health care provider. Make sure you discuss any questions you have with your health care provider. Document Revised: 04/22/2020 Document Reviewed: 04/22/2020 Elsevier Patient Education  2022 [employer].

## 2021-06-30 NOTE — Progress Notes (Addendum)
      [address]       [place] [address]             [phone]       1 Day Post-Op Procedure(s) (LRB): XI ROBOTIC ASSISTED THORASCOPY-RIGHT UPPER LOBE WEDGE RESECTION (Right) RIGHT UPPER LOBECTOMY INTERCOSTAL NERVE BLOCK LYMPH NODE DISSECTION  Subjective: Patient with some nausea, but no vomiting. She also has pain at chest tube site  Objective: Vital signs in last 24 hours: Temp:  [97.8 F (36.6 C)-98.5 F (36.9 C)] 98.3 F (36.8 C) (11/18 0328) Pulse Rate:  [80-90] 80 (11/18 0328) Cardiac Rhythm: Normal sinus rhythm (11/18 0405) Resp:  [15-20] 19 (11/18 0328) BP: (92-134)/(50-69) 92/54 (11/18 0328) SpO2:  [92 %-99 %] 95 % (11/18 0328) Weight:  [72.6 kg] 72.6 kg (11/17 1001)     Intake/Output from previous day: 11/17 0701 - 11/18 0700 In: 4332 [P.O.:120; I.V.:1400; IV Piggyback:200] Out: 800 [Urine:450; Blood:100; Chest Tube:250]   Physical Exam:  Cardiovascular: RRR Pulmonary: Clear to auscultation bilaterally Abdomen: Soft, non tender, bowel sounds present. Extremities: No lower extremity edema. Wounds: Clean and dry.  No erythema or signs of infection. Chest Tube: to water seal, tidling with cough but no air leak  Lab Results: CBC: Recent Labs    06/27/21 1400 06/30/21 0132  WBC 9.4 13.4*  HGB 13.0 10.8*  HCT 40.1 33.7*  PLT 291 228   BMET:  Recent Labs    06/27/21 1400 06/30/21 0132  NA 140 139  K 3.0* 3.3*  CL 101 103  CO2 28 29  GLUCOSE 122* 145*  BUN 14 9  CREATININE 1.12* 0.97  CALCIUM 9.0 8.4*    PT/INR:  Recent Labs    06/27/21 1400  LABPROT 13.9  INR 1.1   ABG:  INR: Will add last result for INR, ABG once components are confirmed Will add last 4 CBG results once components are confirmed  Assessment/Plan:  1. CV - SR with HR in the 80's this am. 2.  Pulmonary - On 2 liters of oxygen via [place]. Wean as able. Chest tube with 250 cc since surgery. Chest tube is to water seal, tidling with cough but no  air leak. CXR this am appears to show small right apical pneumothorax, left base atelectasis . May remove chest tube later todayEncourage incentive spirometer. Await final pathology. 3. Supplement potassium 4. Expected post op blood loss anemia-H and H this am 10.8 and 33.7  5. History of hypothyroid-continue Levothyroxine  Jenny M ZimmermanPA-C 06/30/2021,6:54 AM [phone]   Overall doing well. No leak on exam, and the chest x-ray shows no pneumothorax. Will remove chest tube. Likely discharge tomorrow.  Crystalle Popwell Bary Leriche

## 2021-06-30 NOTE — Addendum Note (Signed)
Addendum  created 06/30/21 1424 by Donnella Bi, RN   Attached Procedures edited

## 2021-07-01 ENCOUNTER — Inpatient Hospital Stay (HOSPITAL_COMMUNITY): Payer: Medicare Other

## 2021-07-01 LAB — COMPREHENSIVE METABOLIC PANEL
ALT: 12 U/L (ref 0–44)
AST: 23 U/L (ref 15–41)
Albumin: 3 g/dL — ABNORMAL LOW (ref 3.5–5.0)
Alkaline Phosphatase: 68 U/L (ref 38–126)
Anion gap: 4 — ABNORMAL LOW (ref 5–15)
BUN: 11 mg/dL (ref 8–23)
CO2: 29 mmol/L (ref 22–32)
Calcium: 8.3 mg/dL — ABNORMAL LOW (ref 8.9–10.3)
Chloride: 105 mmol/L (ref 98–111)
Creatinine, Ser: 1.09 mg/dL — ABNORMAL HIGH (ref 0.44–1.00)
GFR, Estimated: 54 mL/min — ABNORMAL LOW (ref >60)
Glucose, Bld: 142 mg/dL — ABNORMAL HIGH (ref 70–99)
Potassium: 4 mmol/L (ref 3.5–5.1)
Sodium: 138 mmol/L (ref 135–145)
Total Bilirubin: 0.4 mg/dL (ref 0.3–1.2)
Total Protein: 5.5 g/dL — ABNORMAL LOW (ref 6.5–8.1)

## 2021-07-01 LAB — CBC
HCT: 32.7 % — ABNORMAL LOW (ref 36.0–46.0)
Hemoglobin: 10.2 g/dL — ABNORMAL LOW (ref 12.0–15.0)
MCH: 27.9 pg (ref 26.0–34.0)
MCHC: 31.2 g/dL (ref 30.0–36.0)
MCV: 89.3 fL (ref 80.0–100.0)
Platelets: 222 10*3/uL (ref 150–400)
RBC: 3.66 MIL/uL — ABNORMAL LOW (ref 3.87–5.11)
RDW: 13.2 % (ref 11.5–15.5)
WBC: 10.2 10*3/uL (ref 4.0–10.5)
nRBC: 0 % (ref 0.0–0.2)

## 2021-07-01 MED ORDER — PHENOL 1.4 % MT LIQD
1.0000 | OROMUCOSAL | Status: DC | PRN
  Filled 2021-07-01: qty 177

## 2021-07-01 MED ORDER — GUAIFENESIN ER 600 MG PO TB12
600.0000 mg | ORAL_TABLET | Freq: Two times a day (BID) | ORAL | Status: DC
  Administered 2021-07-01 – 2021-07-02 (×2): 600 mg via ORAL
  Filled 2021-07-01 (×2): qty 1

## 2021-07-01 NOTE — Progress Notes (Addendum)
      [address]       [place] [address]             [phone]      2 Days Post-Op Procedure(s) (LRB): XI ROBOTIC ASSISTED THORASCOPY-RIGHT UPPER LOBE WEDGE RESECTION (Right) RIGHT UPPER LOBECTOMY INTERCOSTAL NERVE BLOCK LYMPH NODE DISSECTION  Patient had a rough night.  States she went to sleep in a fetal position and woke up in so much pain she couldn't move.  She states she doesn't feel like she can go home today in her current state.  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  Objective: Vital signs in last 24 hours: Temp:  [98.1 F (36.7 C)-99.1 F (37.3 C)] 98.6 F (37 C) (11/19 0750) Pulse Rate:  [80-83] 83 (11/19 0330) Cardiac Rhythm: Normal sinus rhythm (11/19 0750) Resp:  [17-20] 17 (11/19 0330) BP: (98-110)/(44-62) 106/62 (11/19 0330) SpO2:  [91 %-94 %] 92 % (11/19 0330)  Intake/Output from previous day: 11/18 0701 - 11/19 0700 In: 380 [P.O.:380] Out: -   General appearance: alert, cooperative, and no distress Heart: regular rate and rhythm Lungs: clear to auscultation bilaterally Abdomen: soft, non-tender; bowel sounds normal; no masses,  no organomegaly Extremities: extremities normal, atraumatic, no cyanosis or edema Wound: clean and dry  Lab Results: Recent Labs    06/30/21 0132 07/01/21 0059  WBC 13.4* 10.2  HGB 10.8* 10.2*  HCT 33.7* 32.7*  PLT 228 222   BMET:  Recent Labs    06/30/21 0132 07/01/21 0059  NA 139 138  K 3.3* 4.0  CL 103 105  CO2 29 29  GLUCOSE 145* 142*  BUN 9 11  CREATININE 0.97 1.09*  CALCIUM 8.4* 8.3*    PT/INR: No results for input(s): LABPROT, INR in the last 72 hours. ABG    Component Value Date/Time   PHART 7.468 (H) 06/27/2021 1440   HCO3 25.4 06/27/2021 1440   TCO2 24 10/19/2010 2300   O2SAT 97.8 06/27/2021 1440   CBG (last 3)  Recent Labs    06/29/21 1535 06/29/21 2200 06/30/21 0611  GLUCAP 160* 127* 140*    Assessment/Plan: S/P Procedure(s)  (LRB): XI ROBOTIC ASSISTED THORASCOPY-RIGHT UPPER LOBE WEDGE RESECTION (Right) RIGHT UPPER LOBECTOMY INTERCOSTAL NERVE BLOCK LYMPH NODE DISSECTION  CV- NSR, BP stable Pulm- CXR is stable, trace space present, continue IS Pain- severe pain overnight, described as burning, not unexpected, continue pain medications Dispo- patient stable, will have patient ambulate several times today, monitor today if pain is controlled can d/c home in AM   LOS: 2 days    Ellwood Handler, PA-C 07/01/2021 Patient seen and examined, agree with above Pain issues as noted above CXR OK  Heylee Tant C. Roxan Hockey, MD [facility] [phone]

## 2021-07-02 ENCOUNTER — Inpatient Hospital Stay (HOSPITAL_COMMUNITY): Payer: Medicare Other

## 2021-07-02 MED ORDER — TRAMADOL HCL 50 MG PO TABS: 50.0000 mg | ORAL_TABLET | Freq: Four times a day (QID) | ORAL | 0 refills | Status: DC | PRN

## 2021-07-02 MED ORDER — TIZANIDINE HCL 4 MG PO TABS
4.0000 mg | ORAL_TABLET | Freq: Once | ORAL | Status: AC
  Administered 2021-07-02: 4 mg via ORAL
  Filled 2021-07-02: qty 1

## 2021-07-02 MED ORDER — TIZANIDINE HCL 4 MG PO TABS: 4.0000 mg | ORAL_TABLET | Freq: Three times a day (TID) | ORAL | 0 refills | Status: DC | PRN

## 2021-07-02 MED ORDER — GUAIFENESIN ER 600 MG PO TB12
600.0000 mg | ORAL_TABLET | Freq: Two times a day (BID) | ORAL | Status: DC
Start: 2021-07-02 — End: 2023-05-02

## 2021-07-02 MED ORDER — METHOCARBAMOL 500 MG PO TABS: 500.0000 mg | ORAL_TABLET | Freq: Once | ORAL | Status: DC

## 2021-07-02 MED ORDER — PHENOL 1.4 % MT LIQD: 1.0000 | OROMUCOSAL | 0 refills | Status: DC | PRN

## 2021-07-02 NOTE — Plan of Care (Signed)

## 2021-07-02 NOTE — Progress Notes (Signed)
RN went over d/c summary with pt and pt's daughter. NT removed PIV. Belongings w/ pt's daughter, who is transporting pt home. NT transporting pt to private vehicle.

## 2021-07-02 NOTE — Progress Notes (Addendum)
      [address]       [place] [address]             [phone]      3 Days Post-Op Procedure(s) (LRB): XI ROBOTIC ASSISTED THORASCOPY-RIGHT UPPER LOBE WEDGE RESECTION (Right) RIGHT UPPER LOBECTOMY INTERCOSTAL NERVE BLOCK LYMPH NODE DISSECTION  Subjective:  Patient didn't sleep well again last night.  She states that she has a cramping type pain in her right breast.  She also gets cold at times.   Objective: Vital signs in last 24 hours: Temp:  [98.1 F (36.7 C)-99 F (37.2 C)] 98.2 F (36.8 C) (11/20 0457) Pulse Rate:  [77-88] 78 (11/20 0457) Cardiac Rhythm: Normal sinus rhythm (11/20 0451) Resp:  [16-22] 22 (11/20 0457) BP: (113-129)/(47-68) 129/68 (11/20 0457) SpO2:  [90 %-98 %] 90 % (11/20 0457)  Intake/Output from previous day: 11/19 0701 - 11/20 0700 In: 480 [P.O.:480] Out: -   General appearance: alert, cooperative, and no distress Heart: regular rate and rhythm Lungs: clear to auscultation bilaterally Abdomen: soft, non-tender; bowel sounds normal; no masses,  no organomegaly Extremities: extremities normal, atraumatic, no cyanosis or edema Wound: clean and dry  Lab Results: Recent Labs    06/30/21 0132 07/01/21 0059  WBC 13.4* 10.2  HGB 10.8* 10.2*  HCT 33.7* 32.7*  PLT 228 222   BMET:  Recent Labs    06/30/21 0132 07/01/21 0059  NA 139 138  K 3.3* 4.0  CL 103 105  CO2 29 29  GLUCOSE 145* 142*  BUN 9 11  CREATININE 0.97 1.09*  CALCIUM 8.4* 8.3*    PT/INR: No results for input(s): LABPROT, INR in the last 72 hours. ABG    Component Value Date/Time   PHART 7.468 (H) 06/27/2021 1440   HCO3 25.4 06/27/2021 1440   TCO2 24 10/19/2010 2300   O2SAT 97.8 06/27/2021 1440   CBG (last 3)  Recent Labs    06/29/21 1535 06/29/21 2200 06/30/21 0611  GLUCAP 160* 127* 140*    Assessment/Plan: S/P Procedure(s) (LRB): XI ROBOTIC ASSISTED THORASCOPY-RIGHT UPPER LOBE WEDGE RESECTION (Right) RIGHT UPPER  LOBECTOMY INTERCOSTAL NERVE BLOCK LYMPH NODE DISSECTION  CV- hemodynamically stable in NSR Pulm- improvement in right apical space, sub q emphysema, hiatal hernia remains present Pain control- patient actually uses Zanaflex at home will give a dose this morning, continue Tylenol, tramadol prn Dispo- patient stable, improvement in CXR, home Zanaflex added for muscle spasms will d/c home today   LOS: 3 days    Ellwood Handler, PA-C 07/02/2021

## 2021-07-03 ENCOUNTER — Encounter (HOSPITAL_COMMUNITY): Payer: Self-pay | Admitting: Emergency Medicine

## 2021-07-03 LAB — SURGICAL PATHOLOGY

## 2021-07-11 DIAGNOSIS — Z20822 Contact with and (suspected) exposure to covid-19: Secondary | ICD-10-CM | POA: Diagnosis not present

## 2021-07-13 ENCOUNTER — Other Ambulatory Visit: Payer: Self-pay | Admitting: *Deleted

## 2021-07-13 NOTE — Progress Notes (Signed)
The proposed treatment discussed in cancer conference is for discussion purpose only and is not a binding recommendation. The patient was not physically examined nor present for their treatment options. Therefore, final treatment plans cannot be decided.  ?

## 2021-07-14 ENCOUNTER — Ambulatory Visit (INDEPENDENT_AMBULATORY_CARE_PROVIDER_SITE_OTHER): Payer: Self-pay | Admitting: Thoracic Surgery (Cardiothoracic Vascular Surgery)

## 2021-07-14 ENCOUNTER — Ambulatory Visit: Payer: Medicare Other | Admitting: Nurse Practitioner

## 2021-07-14 ENCOUNTER — Other Ambulatory Visit: Payer: Self-pay

## 2021-07-14 VITALS — BP 129/79 | HR 98 | Resp 20 | Ht 62.0 in | Wt 161.0 lb

## 2021-07-14 DIAGNOSIS — Z9889 Other specified postprocedural states: Secondary | ICD-10-CM

## 2021-07-14 MED ORDER — TIZANIDINE HCL 4 MG PO TABS
4.0000 mg | ORAL_TABLET | Freq: Three times a day (TID) | ORAL | 0 refills | Status: DC | PRN
Start: 2021-07-14 — End: 2022-03-29

## 2021-07-14 NOTE — Progress Notes (Signed)
      [address]       [place] [address]             [phone]        [facility] Medical Record #[id number] Date of Birth: 12/06/1950  Referring: Collene Gobble, MD Primary Care: Maurice Small, MD (Inactive) Primary Cardiologist:None  Reason for visit:   follow-up  History of Present Illness:     70 year old female presents in follow-up after robotic assisted lobectomy.  Overall she is doing well.  She denies any shortness of breath.  Physical Exam: BP 129/79 (BP Location: Left Arm, Patient Position: Sitting)   Pulse 98   Resp 20   Ht 5\' 2"  (1.575 m)   Wt 161 lb (73 kg)   SpO2 97% Comment: RA  BMI 29.45 kg/m   Alert NAD Incision clean.   Abdomen soft, ND no peripheral edema   Diagnostic Studies & Laboratory data:  Path: FINAL MICROSCOPIC DIAGNOSIS:  A. LUNG, RIGHT UPPER LOBE, WEDGE RESECTION:  - Adenocarcinoma, 0.8 cm.  - Margins not involved.  - See oncology table.   B. LYMPH NODE, LEVEL 9, EXCISION:  - Lymph node negative for metastatic carcinoma.   C. LYMPH NODE, LEVEL 9 #2, EXCISION:  - Lymph node negative for metastatic carcinoma.   D. LYMPH NODE, LEVEL 7, EXCISION:  - Lymph node negative for metastatic carcinoma.   E. LYMPH NODE, HILAR #1, EXCISION:  - Lymph node negative for metastatic carcinoma.   F. LYMPH NODE, HILAR #2, EXCISION:  - Lymph node negative for metastatic carcinoma.   G. LUNG, RIGHT UPPER LOBE, LOBECTOMY:  - Findings consistent with previous wedge biopsy.  - No residual carcinoma identified.  - Three hilar lymph nodes negative for metastatic carcinoma.   ONCOLOGY TABLE:  LUNG: Resection  Synchronous Tumors: Not applicable  Total Number of Primary Tumors: 1  Procedure: Right upper lobe wedge biopsy, lymph node biopsies and right  upper lobectomy.  Specimen Laterality: Right  Tumor Focality: Unifocal  Tumor Site: Right upper lung lobe.  Tumor Size: 0.8 cm.  Histologic Type: Adenocarcinoma,  predominantly acinar type.  Visceral Pleura Invasion: Not identified.  Direct Invasion of Adjacent Structures: No adjacent structures present.  Lymphovascular Invasion: Not identified.  Margins: All surgical margins negative for carcinoma.  Treatment Effect: No known presurgical therapy.  Regional Lymph Nodes:       Number of Lymph Nodes Involved: 0                            Nodal Sites with Tumor: 0       Number of Lymph Nodes Examined: 8                       Nodal Sites Examined: Hilar, 7, 9  Distant Metastasis:       Distant Site(s) Involved: Not applicable.  Pathologic Stage Classification (pTNM, AJCC 8th Edition): pT1a, pN0     Assessment / Plan:   70 year old female status post robotic assisted right upper lobectomy for a T1 N0 M0 stage I adenocarcinoma overall she is doing well.  She will follow-up in 1 month with a chest x-ray.   Lajuana Matte 07/14/2021 4:58 PM

## 2021-08-03 ENCOUNTER — Other Ambulatory Visit: Payer: Self-pay | Admitting: Thoracic Surgery (Cardiothoracic Vascular Surgery)

## 2021-08-03 DIAGNOSIS — R911 Solitary pulmonary nodule: Secondary | ICD-10-CM

## 2021-08-15 DIAGNOSIS — L814 Other melanin hyperpigmentation: Secondary | ICD-10-CM | POA: Diagnosis not present

## 2021-08-15 DIAGNOSIS — L308 Other specified dermatitis: Secondary | ICD-10-CM | POA: Diagnosis not present

## 2021-08-15 DIAGNOSIS — L821 Other seborrheic keratosis: Secondary | ICD-10-CM | POA: Diagnosis not present

## 2021-08-15 DIAGNOSIS — L2089 Other atopic dermatitis: Secondary | ICD-10-CM | POA: Diagnosis not present

## 2021-08-15 DIAGNOSIS — D225 Melanocytic nevi of trunk: Secondary | ICD-10-CM | POA: Diagnosis not present

## 2021-08-18 ENCOUNTER — Ambulatory Visit
Admission: RE | Admit: 2021-08-18 | Discharge: 2021-08-18 | Disposition: A | Discharge status: Home / Self Care | Payer: Medicare Other | Source: Ambulatory Visit | Attending: Thoracic Surgery (Cardiothoracic Vascular Surgery) | Admitting: Thoracic Surgery (Cardiothoracic Vascular Surgery)

## 2021-08-18 ENCOUNTER — Other Ambulatory Visit: Payer: Self-pay

## 2021-08-18 ENCOUNTER — Ambulatory Visit (INDEPENDENT_AMBULATORY_CARE_PROVIDER_SITE_OTHER): Payer: Self-pay | Admitting: Thoracic Surgery (Cardiothoracic Vascular Surgery)

## 2021-08-18 VITALS — BP 132/76 | HR 92 | Resp 20 | Ht 62.0 in | Wt 165.0 lb

## 2021-08-18 DIAGNOSIS — R911 Solitary pulmonary nodule: Secondary | ICD-10-CM

## 2021-08-18 DIAGNOSIS — Z9889 Other specified postprocedural states: Secondary | ICD-10-CM

## 2021-08-18 NOTE — Progress Notes (Signed)
° °   °  [address]       [place] [address]             [phone]        [facility] Medical Record #[id number] Date of Birth: [birth year]  Referring: Collene Gobble, MD Primary Care: Maurice Small, MD Primary Cardiologist:None  Reason for visit:   follow-up  History of Present Illness:     71 year old female presents in follow-up after undergoing a robotic assisted right upper lobectomy for stage I adenocarcinoma.  Overall she is doing well.  She denies any pain.  She does have some paresthesias over her access incision.  Physical Exam: BP 132/76 (BP Location: Right Arm, Patient Position: Sitting)    Pulse 92    Resp 20    Ht 5\' 2"  (1.575 m)    Wt 165 lb (74.8 kg)    SpO2 98% Comment: RA   BMI 30.18 kg/m   Alert NAD Incision well-healed.   Abdomen soft, ND No peripheral edema   Diagnostic Studies & Laboratory data: CXR: Clear     Assessment / Plan:   71 year old female status post right upper lobectomy for stage I adenocarcinoma.  She continues to do well postoperatively.  She will continue surveillance with the medical oncology service.  She will follow-up with [place] as needed.   Lajuana Matte 08/18/2021 9:04 PM

## 2021-08-21 ENCOUNTER — Encounter: Payer: Self-pay | Admitting: *Deleted

## 2021-08-21 NOTE — Progress Notes (Signed)
Oncology Nurse Navigator Documentation  Oncology Nurse Navigator Flowsheets 08/21/2021  Abnormal Finding Date 03/30/2021  Confirmed Diagnosis Date 06/29/2021  Diagnosis Status Confirmed Diagnosis Complete  Planned Course of Treatment Surgery  Phase of Treatment Surgery  Navigator Follow Up Date: 08/23/2021  Navigator Follow Up Reason: Patient Call/I received referral on Jenny Cardenas. I called to schedule but was unable to reach. I did leave vm message with my name and phone number to call.   Navigator Location [facility]  Referral Date to RadOnc/MedOnc 08/18/2021  Navigator Encounter Type Telephone  Telephone Outgoing Call  Treatment Initiated Date 06/29/2021  Patient Visit Type Initial  Treatment Phase Other  Barriers/Navigation Needs Coordination of Care  Interventions Coordination of Care;Education  Acuity Level 2-Minimal Needs (1-2 Barriers Identified)  Coordination of Care Other  Education Method Verbal  Time Spent with Patient 30

## 2021-08-28 ENCOUNTER — Encounter: Payer: Self-pay | Admitting: *Deleted

## 2021-08-28 DIAGNOSIS — R911 Solitary pulmonary nodule: Secondary | ICD-10-CM

## 2021-08-28 NOTE — Progress Notes (Signed)
Oncology Nurse Navigator Documentation  Oncology Nurse Navigator Flowsheets 08/28/2021 08/21/2021  Abnormal Finding Date - 03/30/2021  Confirmed Diagnosis Date - 06/29/2021  Diagnosis Status - Confirmed Diagnosis Complete  Planned Course of Treatment - Surgery  Phase of Treatment - Surgery  Navigator Follow Up Date: 09/19/2021 08/23/2021  Navigator Follow Up Reason: New Patient Appointment Patient Call  Navigator Location [facility]  Referral Date to RadOnc/MedOnc - 08/18/2021  Navigator Encounter Type Telephone Telephone  Telephone Outgoing Call Outgoing Call  Treatment Initiated Date - 06/29/2021  Patient Visit Type Initial Initial  Treatment Phase - Other  Barriers/Navigation Needs Coordination of Care;Education/I called Jenny Cardenas today. I was able to speak to her and schedule an appt with Dr. Julien Nordmann. She verbalized understanding of appt time and place.  Coordination of Care  Education Other -  Interventions Coordination of Care;Education Coordination of Care;Education  Acuity Level 2-Minimal Needs (1-2 Barriers Identified) Level 2-Minimal Needs (1-2 Barriers Identified)  Coordination of Care Appts Other  Education Method Verbal Verbal  Time Spent with Patient 30 30

## 2021-09-19 ENCOUNTER — Other Ambulatory Visit: Payer: Self-pay

## 2021-09-19 ENCOUNTER — Inpatient Hospital Stay: Payer: Medicare Other | Attending: Internal Medicine

## 2021-09-19 ENCOUNTER — Inpatient Hospital Stay (HOSPITAL_BASED_OUTPATIENT_CLINIC_OR_DEPARTMENT_OTHER): Payer: Medicare Other | Admitting: Internal Medicine

## 2021-09-19 ENCOUNTER — Encounter: Payer: Self-pay | Admitting: *Deleted

## 2021-09-19 VITALS — BP 128/70 | HR 82 | Temp 98.0°F | Resp 17 | Wt 171.4 lb

## 2021-09-19 DIAGNOSIS — K219 Gastro-esophageal reflux disease without esophagitis: Secondary | ICD-10-CM

## 2021-09-19 DIAGNOSIS — E1122 Type 2 diabetes mellitus with diabetic chronic kidney disease: Secondary | ICD-10-CM

## 2021-09-19 DIAGNOSIS — C3490 Malignant neoplasm of unspecified part of unspecified bronchus or lung: Secondary | ICD-10-CM

## 2021-09-19 DIAGNOSIS — I129 Hypertensive chronic kidney disease with stage 1 through stage 4 chronic kidney disease, or unspecified chronic kidney disease: Secondary | ICD-10-CM | POA: Insufficient documentation

## 2021-09-19 DIAGNOSIS — C3411 Malignant neoplasm of upper lobe, right bronchus or lung: Secondary | ICD-10-CM | POA: Insufficient documentation

## 2021-09-19 DIAGNOSIS — R911 Solitary pulmonary nodule: Secondary | ICD-10-CM

## 2021-09-19 DIAGNOSIS — F909 Attention-deficit hyperactivity disorder, unspecified type: Secondary | ICD-10-CM | POA: Insufficient documentation

## 2021-09-19 DIAGNOSIS — Z79899 Other long term (current) drug therapy: Secondary | ICD-10-CM | POA: Insufficient documentation

## 2021-09-19 DIAGNOSIS — N189 Chronic kidney disease, unspecified: Secondary | ICD-10-CM

## 2021-09-19 DIAGNOSIS — Z87891 Personal history of nicotine dependence: Secondary | ICD-10-CM | POA: Diagnosis not present

## 2021-09-19 DIAGNOSIS — F32A Depression, unspecified: Secondary | ICD-10-CM

## 2021-09-19 DIAGNOSIS — E039 Hypothyroidism, unspecified: Secondary | ICD-10-CM | POA: Diagnosis not present

## 2021-09-19 DIAGNOSIS — Z803 Family history of malignant neoplasm of breast: Secondary | ICD-10-CM | POA: Diagnosis not present

## 2021-09-19 DIAGNOSIS — F419 Anxiety disorder, unspecified: Secondary | ICD-10-CM | POA: Insufficient documentation

## 2021-09-19 LAB — CBC WITH DIFFERENTIAL (CANCER CENTER ONLY)
Abs Immature Granulocytes: 0.02 10*3/uL (ref 0.00–0.07)
Basophils Absolute: 0 10*3/uL (ref 0.0–0.1)
Basophils Relative: 0 %
Eosinophils Absolute: 0.3 10*3/uL (ref 0.0–0.5)
Eosinophils Relative: 4 %
HCT: 37.4 % (ref 36.0–46.0)
Hemoglobin: 12 g/dL (ref 12.0–15.0)
Immature Granulocytes: 0 %
Lymphocytes Relative: 28 %
Lymphs Abs: 2.1 10*3/uL (ref 0.7–4.0)
MCH: 27.3 pg (ref 26.0–34.0)
MCHC: 32.1 g/dL (ref 30.0–36.0)
MCV: 85 fL (ref 80.0–100.0)
Monocytes Absolute: 0.5 10*3/uL (ref 0.1–1.0)
Monocytes Relative: 6 %
Neutro Abs: 4.6 10*3/uL (ref 1.7–7.7)
Neutrophils Relative %: 62 %
Platelet Count: 270 10*3/uL (ref 150–400)
RBC: 4.4 MIL/uL (ref 3.87–5.11)
RDW: 13.2 % (ref 11.5–15.5)
WBC Count: 7.5 10*3/uL (ref 4.0–10.5)
nRBC: 0 % (ref 0.0–0.2)

## 2021-09-19 LAB — CMP (CANCER CENTER ONLY)
ALT: 9 U/L (ref 0–44)
AST: 15 U/L (ref 15–41)
Albumin: 4 g/dL (ref 3.5–5.0)
Alkaline Phosphatase: 112 U/L (ref 38–126)
Anion gap: 6 (ref 5–15)
BUN: 10 mg/dL (ref 8–23)
CO2: 29 mmol/L (ref 22–32)
Calcium: 8.9 mg/dL (ref 8.9–10.3)
Chloride: 107 mmol/L (ref 98–111)
Creatinine: 1.04 mg/dL — ABNORMAL HIGH (ref 0.44–1.00)
GFR, Estimated: 57 mL/min — ABNORMAL LOW (ref >60)
Glucose, Bld: 117 mg/dL — ABNORMAL HIGH (ref 70–99)
Potassium: 3.7 mmol/L (ref 3.5–5.1)
Sodium: 142 mmol/L (ref 135–145)
Total Bilirubin: 0.4 mg/dL (ref 0.3–1.2)
Total Protein: 6.9 g/dL (ref 6.5–8.1)

## 2021-09-19 NOTE — Progress Notes (Signed)
[address] [phone]   [phone]  CONSULT NOTE  REFERRING PHYSICIAN: Dr. Melodie Bouillon  REASON FOR CONSULTATION:  71 years old white female recently diagnosed with lung cancer  HPI Jenny Cardenas is a 71 y.o. female with past medical history significant for hypertension, dyslipidemia, hypothyroidism, chronic kidney disease, diabetes mellitus, and anxiety/depression, ADHD as well as GERD and history of smoking but quit 27 years ago.  The patient mentioned that she had a fall in November 2020 and imaging studies at that time showed groundglass opacities within the right upper lung.  She was followed by her primary care physician with several scans.  Repeat CT scan of the chest without contrast on March 29, 2021 showed continued enlargement of the right upper lobe groundglass nodule measuring 1.6 cm suspicious for adenocarcinoma.  She had a PET scan on 04/27/2021 and that showed no hypermetabolic activity associated with the groundglass nodule in the right upper lobe but still suspicious for low-grade adenocarcinoma.  The patient was referred to Dr. Lamonte Sakai and on June 29, 2021 she underwent video bronchoscopy with bronchoscopic navigation and biopsy followed by right upper lobectomy with mediastinal lymph node sampling under the care of Dr. Lamonte Sakai and Dr. Kipp Brood.  The final pathology [id number]) showed adenocarcinoma measuring 0.8 cm with negative resection margin.  There was no evidence for visceral pleural or lymphovascular invasion.  The dissected lymph nodes were negative for malignancy. The patient has been recovering slowly from her surgery and she is here today for evaluation and recommendation regarding her condition. When seen today she is feeling fine except for intermittent tightness in her throat but no significant chest pain, shortness of breath except when she has anxiety.  She has mild cough with no hemoptysis.  She denied having any  recent weight loss or night sweats.  She has no nausea, vomiting, diarrhea or constipation.  She has no headache or visual changes. Family history significant for mother with COPD and depression.  Father had Alzheimer and sister had breast cancer and another sister had non-Hodgkin's lymphoma. The patient is a widow and has 2 daughters.  She used to work for [employer].  She has a history of smoking for around 25 years but quit 27 years ago.  She has no history of alcohol or drug abuse.  HPI  Past Medical History:  Diagnosis Date   ADHD (attention deficit hyperactivity disorder) 2020   ADD   Anxiety    Arthritis    Broken heart syndrome 2013   Carpal tunnel syndrome    Cataract    Chronic kidney disease    Depression    Diabetes mellitus without complication ([place])    Type 2   Dysrhythmia 1976   tachycardia   Fall 04/10/2015   GERD (gastroesophageal reflux disease)    Goiter    History of shingles    On abdomen, left ring finger, and left leg; Pt takes Valtrex   Hyperlipidemia    Hypertension    Hypothyroidism    Obesity    PTSD (post-traumatic stress disorder)    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   Shortness of breath dyspnea    pt stated its related to the back problems she has   Thyroid disease    Ulcer     Past Surgical History:  Procedure Laterality Date   ABDOMINAL HYSTERECTOMY  1996   BRONCHIAL BRUSHINGS  06/29/2021   Procedure: BRONCHIAL BRUSHINGS;  Surgeon: Collene Gobble, MD;  Location: [facility];  Service: Pulmonary;;   BRONCHIAL NEEDLE ASPIRATION BIOPSY  06/29/2021   Procedure: BRONCHIAL NEEDLE ASPIRATION BIOPSIES;  Surgeon: Collene Gobble, MD;  Location: [facility];  Service: Pulmonary;;   CATARACT EXTRACTION, BILATERAL     CESAREAN [place]   COLONOSCOPY W/ POLYPECTOMY     EYE SURGERY Bilateral 2014   cataratact and lasik surgery   FIDUCIAL MARKER PLACEMENT  06/29/2021   Procedure: FIDUCIAL DYE MARKING;  Surgeon: Collene Gobble,  MD;  Location[facility];  Service: Pulmonary;;   INTERCOSTAL NERVE BLOCK  06/29/2021   Procedure: INTERCOSTAL NERVE BLOCK;  Surgeon: Lajuana Matte, MD;  Location: [facility];  Service: Thoracic;;   KYPHOPLASTY N/A 06/02/2015   Procedure: KYPHOPLASTY;  Surgeon: Phylliss Bob, MD;  Location: [facility];  Service: Orthopedics;  Laterality: N/A;  Thoracic 3, 8, 10 kyphoplasty   LOBECTOMY  06/29/2021   Procedure: RIGHT UPPER LOBECTOMY;  Surgeon: Lajuana Matte, MD;  Location: [facility];  Service: Thoracic;;   LYMPH NODE DISSECTION  06/29/2021   Procedure: LYMPH NODE DISSECTION;  Surgeon: Lajuana Matte, MD;  Location: [facility];  Service: Thoracic;;    Family History  Problem Relation Age of Onset   COPD Mother    Depression Mother    Hypertension Mother    Hyperlipidemia Mother    Breast cancer Mother 48   Alzheimer's disease Father    Heart disease Father    Breast cancer Sister 86   Colon cancer Neg Hx     Social History Social History   Tobacco Use   Smoking status: Former    Packs/day: 0.50    Years: 4.00    Pack years: 2.00    Types: Cigarettes    Quit date: 06/28/2016    Years since quitting: 5.2   Smokeless tobacco: Never  Vaping Use   Vaping Use: Never used  Substance Use Topics   Alcohol use: Yes    Comment: rare glass of wine (holidays only)   Drug use: No    Allergies  Allergen Reactions   Ethylene Oxide     Other reaction(s): scarred cornea   Other     Etholine Oxide: causes itching and hives, and temporary blindness    Current Outpatient Medications  Medication Sig Dispense Refill   albuterol (PROVENTIL) (2.5 MG/3ML) 0.083% nebulizer solution Take 2.5 mg by nebulization every 4 (four) hours as needed for wheezing or shortness of breath.   10   ALPRAZolam (XANAX) 0.5 MG tablet Take 1 tablet (0.5 mg total) by mouth 3 (three) times daily as needed for anxiety. 10 tablet 0   amphetamine-dextroamphetamine (ADDERALL) 10 MG tablet Take 1 tablet (10 mg  total) by mouth every morning. 10 tablet 0   buPROPion (WELLBUTRIN XL) 150 MG 24 hr tablet Take 150 mg by mouth daily.     cycloSPORINE (RESTASIS) 0.05 % ophthalmic emulsion Place 1 drop into both eyes 2 (two) times daily.     diphenhydramine-acetaminophen (TYLENOL PM) 25-500 MG TABS tablet Take 2 tablets by mouth at bedtime as needed.     furosemide (LASIX) 20 MG tablet Take 20 mg by mouth daily. May take an additional dose if needed for swelling     guaiFENesin (MUCINEX) 600 MG 12 hr tablet Take 1 tablet (600 mg total) by mouth 2 (two) times daily.     levothyroxine (SYNTHROID, LEVOTHROID) 25 MCG tablet Take 25 mcg by mouth daily before breakfast.     lidocaine (LIDODERM)  5 % Place 1 patch onto the skin daily as needed (irritation). Remove & Discard patch within 12 hours or as directed by MD     Multiple Vitamins-Minerals (PRESERVISION AREDS 2 PO) Take 1 capsule by mouth in the morning and at bedtime.     pantoprazole (PROTONIX) 40 MG tablet Take 40 mg by mouth daily.     phenol (CHLORASEPTIC) 1.4 % LIQD Use as directed 1 spray in the mouth or throat as needed for throat irritation / pain.  0   pravastatin (PRAVACHOL) 10 MG tablet Take 10 mg by mouth in the morning and at bedtime.     sertraline (ZOLOFT) 50 MG tablet Take 100 mg by mouth daily.     tiZANidine (ZANAFLEX) 4 MG tablet Take 1 tablet (4 mg total) by mouth every 8 (eight) hours as needed for muscle spasms. 30 tablet 0   traMADol (ULTRAM) 50 MG tablet Take 1 tablet (50 mg total) by mouth every 6 (six) hours as needed (mild pain). 30 tablet 0   No current facility-administered medications for this visit.    Review of Systems  Constitutional: positive for fatigue Eyes: negative Ears, nose, mouth, throat, and face: negative Respiratory: positive for cough and dyspnea on exertion Cardiovascular: negative Gastrointestinal: negative Genitourinary:negative Integument/breast: negative Hematologic/lymphatic:  negative Musculoskeletal:negative Neurological: negative Behavioral/Psych: positive for anxiety Endocrine: negative Allergic/Immunologic: negative  Physical Exam  UXN:ATFTD, healthy, no distress, well nourished, well developed, and anxious SKIN: skin color, texture, turgor are normal, no rashes or significant lesions HEAD: Normocephalic, No masses, lesions, tenderness or abnormalities EYES: normal, PERRLA, Conjunctiva are pink and non-injected EARS: External ears normal, Canals clear OROPHARYNX:no exudate, no erythema, and lips, buccal mucosa, and tongue normal  NECK: supple, no adenopathy, no JVD LYMPH:  no palpable lymphadenopathy, no hepatosplenomegaly BREAST:not examined LUNGS: clear to auscultation , and palpation HEART: regular rate & rhythm, no murmurs, and no gallops ABDOMEN:abdomen soft, non-tender, normal bowel sounds, and no masses or organomegaly BACK: Back symmetric, no curvature., No CVA tenderness EXTREMITIES:no joint deformities, effusion, or inflammation, no edema  NEURO: alert & oriented x 3 with fluent speech, no focal motor/sensory deficits  PERFORMANCE STATUS: ECOG 1  LABORATORY DATA: Lab Results  Component Value Date   WBC 7.5 09/19/2021   HGB 12.0 09/19/2021   HCT 37.4 09/19/2021   MCV 85.0 09/19/2021   PLT 270 09/19/2021      Chemistry      Component Value Date/Time   NA 142 09/19/2021 1035   K 3.7 09/19/2021 1035   CL 107 09/19/2021 1035   CO2 29 09/19/2021 1035   BUN 10 09/19/2021 1035   CREATININE 1.04 (H) 09/19/2021 1035   CREATININE 0.83 10/26/2016 1039      Component Value Date/Time   CALCIUM 8.9 09/19/2021 1035   ALKPHOS 112 09/19/2021 1035   AST 15 09/19/2021 1035   ALT 9 09/19/2021 1035   BILITOT 0.4 09/19/2021 1035       RADIOGRAPHIC STUDIES: No results found.  ASSESSMENT: This is a very pleasant 71 years old white female diagnosed with a stage Ia (T1 a, N0, M0) non-small cell lung cancer, adenocarcinoma status post right  upper lobectomy with lymph node dissection under the care of Dr. Kipp Brood on June 29, 2021.  The tumor size was 0.8 cm.   PLAN: I had a lengthy discussion with the patient today about her current condition and treatment options.  I explained to the patient that she had curative treatment for her condition with the  surgical resection. I also explained to the patient that there is no survival benefit for adjuvant systemic chemotherapy for patient with a stage Ia and the current standard of care is observation and close monitoring. I recommended for the patient to have repeat CT scan of the chest in 6 months for restaging of her disease. For her regular medications, the patient was advised to contact her primary care office for refill and evaluation. She was advised to call immediately if she has any other concerning symptoms in the interval. The patient voices understanding of current disease status and treatment options and is in agreement with the current care plan.  All questions were answered. The patient knows to call the clinic with any problems, questions or concerns. We can certainly see the patient much sooner if necessary.  Thank you so much for allowing me to participate in the care of Jenny Cardenas. I will continue to follow up the patient with you and assist in her care.  The total time spent in the appointment was 60 minutes.  Disclaimer: This note was dictated with voice recognition software. Similar sounding words can inadvertently be transcribed and may not be corrected upon review.   Eilleen Kempf September 19, 2021, 12:08 PM

## 2021-09-19 NOTE — Progress Notes (Signed)
Oncology Nurse Navigator Documentation  Oncology Nurse Navigator Flowsheets 09/19/2021 08/28/2021 08/21/2021  Abnormal Finding Date - - 03/30/2021  Confirmed Diagnosis Date - - 06/29/2021  Diagnosis Status - - Confirmed Diagnosis Complete  Planned Course of Treatment - - Surgery  Phase of Treatment - - Surgery  Navigator Follow Up Date: - 09/19/2021 08/23/2021  Navigator Follow Up Reason: - New Patient Appointment Patient Call  Navigator Location [facility]  Referral Date to RadOnc/MedOnc - - 08/18/2021  Navigator Encounter Type Clinic/MDC Telephone Telephone  Telephone - Outgoing Call Outgoing Call  Treatment Initiated Date - - 06/29/2021  Patient Visit Type Initial;MedOnc/I spoke with patient today at her first visit to see med onc.  She has surgery for lung cancer and medical only recommendation is to follow up in 6 months with a scan.  I help to educate on cancer and plan of care. I added information to her AVS.   Initial Initial  Treatment Phase Other - Other  Barriers/Navigation Needs Education Coordination of Care;Education Coordination of Care  Education Understanding Cancer/ Treatment Options;Other;Newly Diagnosed Cancer Education Other -  Interventions Education;Psycho-Social Support Coordination of Care;Education Coordination of Care;Education  Acuity Level 3-Moderate Needs (3-4 Barriers Identified) Level 2-Minimal Needs (1-2 Barriers Identified) Level 2-Minimal Needs (1-2 Barriers Identified)  Coordination of Care - Appts Other  Education Method Verbal;Other Verbal Verbal  Time Spent with Patient 91 50 56

## 2021-09-19 NOTE — Patient Instructions (Signed)
Lung Cancer Lung cancer is an abnormal growth of cancerous cells that forms a mass (malignant tumor) in a lung. There are several types of lung cancer. The types are based on the appearance of the tumor cells. The two most common types are: Non-small cell lung cancer. This type of lung cancer is the most common type. Non-small cell lung cancers include squamous cell carcinoma, adenocarcinoma, and large cell carcinoma. Small cell lung cancer. In this type of lung cancer, abnormal cells are smaller than those of non-small cell lung cancer. Small cell lung cancer gets worse (progresses) faster than non-small cell lung cancer. What are the causes? The most common cause of lung cancer is smoking tobacco. The second most common cause is exposure to a chemical called radon. What increases the risk? You are more likely to develop this condition if: You smoke tobacco. You have been exposed to: Secondhand tobacco smoke. Radon gas. Uranium. Asbestos. Arsenic in drinking water. Air pollution and diesel exhaust. You have a family or personal history of lung cancer. You have had lung radiation therapy in the past. You are older than age 72. What are the signs or symptoms? In the early stages, you may not have any symptoms. As the cancer progresses, symptoms may include: A lasting cough, possibly with blood. Fatigue. Unexplained weight loss. Shortness of breath. High-pitched whistling sounds when you breathe, most often when you breathe out (wheezing). Chest pain. Loss of appetite. Symptoms of advanced lung cancer include: Hoarseness. Bone or joint pain. Weakness. Change in the structure of the fingernails (clubbing), so that the nail looks like an upside-down spoon. Swelling of the face or arms. Inability to move the face (paralysis). Drooping eyelids. How is this diagnosed? This condition may be diagnosed based on: Your symptoms and medical history. A physical exam. A chest X-ray. A CT  scan. Blood tests. Sputum tests. Removal of a sample of lung tissue (lung biopsy) for testing. Your cancer will be assessed (staged) to determine how severe it is and how much it has spread (metastasized). How is this treated? Treatment depends on the type and stage of your cancer. Treatment may include one or more of the following: Surgery to remove as much of the cancer as possible. Lymph nodes in the area may be removed and tested for cancer as well. Medicines that kill cancer cells (chemotherapy). High-energy rays that kill cancer cells (radiation therapy). Targeted therapy. This targets specific parts of cancer cells and the area around them to block the growth and spread of the cancer. Targeted therapy can help limit the damage to healthy cells. Immunotherapy. This treatment uses a person's own immune system to fight cancer by either boosting the immune system or changing how the immune system works. Follow these instructions at home:  Do not use any products that contain nicotine or tobacco. These products include cigarettes, chewing tobacco, and vaping devices, such as e-cigarettes. If you need help quitting, ask your health care provider. Do not drink alcohol. If you are admitted to the hospital, make sure your cancer specialist (oncologist) is aware. Your cancer may affect your treatment for other conditions. Take over-the-counter and prescription medicines only as told by your health care provider. Work with your health care provider to manage any side effects of treatment. Keep all follow-up visits. This is important. Where to find support Consider joining a local support group for people who have been diagnosed with lung cancer. Where to find more information American Cancer Society: www.cancer.[place]  www.cancer.gov Contact a health care provider if you: Lose weight without trying. Have a persistent cough and wheezing. Feel short of breath. Get  tired easily. Have bone or joint pain. Have difficulty swallowing. Notice that your voice is changing or getting hoarse. Have pain that does not get better with medicine. Get help right away if you: Cough up blood. Have chest pain or new breathing problems. Have a fever. Have swelling in an ankle, leg, or arm, or the face or neck. Have paralysis in your face. Are very confused. Have a drooping eyelid. These symptoms may represent a serious problem that is an emergency. Do not wait to see if the symptoms will go away. Get medical help right away. Call your local emergency services (911 in the [place] Do not drive yourself to the hospital. Summary Lung cancer is an abnormal growth of cancerous cells that forms a mass (malignant tumor) in a lung. There are several types of lung cancer. The types are based on the appearance of the tumor cells. The two most common types are non-small cell and small cell. The most common cause of lung cancer is smoking tobacco. Early symptoms include a lasting cough, possibly with blood, and fatigue, unexplained weight loss, and shortness of breath. After diagnosis, treatment depends on the type and stage of your cancer. This information is not intended to replace advice given to you by your health care provider. Make sure you discuss any questions you have with your health care provider. Document Revised: 01/18/2021 Document Reviewed: 01/18/2021 Elsevier Patient Education  [place].

## 2021-09-25 DIAGNOSIS — Z20822 Contact with and (suspected) exposure to covid-19: Secondary | ICD-10-CM | POA: Diagnosis not present

## 2021-09-29 ENCOUNTER — Encounter: Payer: Self-pay | Admitting: *Deleted

## 2021-09-29 NOTE — Progress Notes (Signed)
Oncology Nurse Navigator Documentation  Oncology Nurse Navigator Flowsheets 09/29/2021 09/19/2021 08/28/2021 08/21/2021  Abnormal Finding Date - - - 03/30/2021  Confirmed Diagnosis Date - - - 06/29/2021  Diagnosis Status - - - Confirmed Diagnosis Complete  Planned Course of Treatment - - - Surgery  Phase of Treatment Surgery - - Surgery  Surgery Actual Start Date: 08/18/2021 - - -  Navigator Follow Up Date: - - 09/19/2021 08/23/2021  Navigator Follow Up Reason: - - New Patient Appointment Patient Call  Navigation Complete Date: 09/29/2021 - - -  Post Navigation: Continue to Follow Patient? No - - -  Reason Not Navigating Patient: No Treatment, Observation Only - - -  Navigator Location [facility]  Referral Date to RadOnc/MedOnc - - - 08/18/2021  Navigator Encounter Type Other: Clinic/[facility] Telephone Telephone  Telephone - - Outgoing Call Outgoing Call  Treatment Initiated Date - - - 06/29/2021  Patient Visit Type Other Initial;MedOnc Initial Initial  Treatment Phase Other Other - Other  Barriers/Navigation Needs Coordination of Care Education Coordination of Care;Education Coordination of Care  Education - Understanding Cancer/ Treatment Options;Other;Newly Diagnosed Cancer Education Other -  Interventions Coordination of Care Education;Psycho-Social Support Coordination of Care;Education Coordination of Care;Education  Acuity Level 2-Minimal Needs (1-2 Barriers Identified) Level 3-Moderate Needs (3-4 Barriers Identified) Level 2-Minimal Needs (1-2 Barriers Identified) Level 2-Minimal Needs (1-2 Barriers Identified)  Coordination of Care Other - Appts Other  Education Method - Verbal;Other Verbal Verbal  Time Spent with Patient [phone]

## 2021-10-11 DIAGNOSIS — I83892 Varicose veins of left lower extremities with other complications: Secondary | ICD-10-CM | POA: Diagnosis not present

## 2021-10-18 DIAGNOSIS — I83892 Varicose veins of left lower extremities with other complications: Secondary | ICD-10-CM | POA: Diagnosis not present

## 2021-10-18 DIAGNOSIS — Z09 Encounter for follow-up examination after completed treatment for conditions other than malignant neoplasm: Secondary | ICD-10-CM | POA: Diagnosis not present

## 2021-10-18 DIAGNOSIS — I83812 Varicose veins of left lower extremities with pain: Secondary | ICD-10-CM | POA: Diagnosis not present

## 2021-10-25 DIAGNOSIS — I87391 Chronic venous hypertension (idiopathic) with other complications of right lower extremity: Secondary | ICD-10-CM | POA: Diagnosis not present

## 2021-10-25 DIAGNOSIS — I83891 Varicose veins of right lower extremities with other complications: Secondary | ICD-10-CM | POA: Diagnosis not present

## 2021-11-14 ENCOUNTER — Encounter: Payer: Self-pay | Admitting: *Deleted

## 2021-11-14 NOTE — Progress Notes (Signed)
Oncology Nurse Navigator Documentation ? ? ?  11/14/2021  ?  9:00 AM 09/29/2021  ?  4:00 PM 09/19/2021  ?  3:00 PM 08/28/2021  ?  3:00 PM 08/21/2021  ? 11:00 AM  ?Oncology Nurse Navigator Flowsheets  ?Abnormal Finding Date     03/30/2021  ?Confirmed Diagnosis Date     06/29/2021  ?Diagnosis Status     Confirmed Diagnosis Complete  ?Planned Course of Treatment     Surgery  ?Phase of Treatment  Surgery   Surgery  ?Surgery Actual Start Date:  08/18/2021     ?Navigator Follow Up Date:    09/19/2021 08/23/2021  ?Navigator Follow Up Reason:    New Patient Appointment Patient Call  ?Navigation Complete Date:  09/29/2021     ?Post Navigation: Continue to Follow Patient?  No     ?Reason Not Navigating Patient:  No Treatment, Observation Only     ?[facility]igator [facility]  ?Referral Date to RadOnc/MedOnc     08/18/2021  ?Navigator Encounter Type Telephone Other: Clinic/[facility] Telephone Telephone  ?Telephone Incoming Call   [employer] [place] Call  ?Treatment Initiated Date     06/29/2021  ?Patient Visit Type  Other Initial;MedOnc Initial Initial  ?Treatment Phase  Other Other  Other  ?Barriers/Navigation Needs Coordination of Care/I received a call from Jenny Cardenas.  She needs her appt's in Aug changed. I explained that scheduling will call her with an update on changed appt.  She was thankful for the update. I completed a scheduling message for them to call and change her appts.   Coordination of Care Education Coordination of Care;Education Coordination of Care  ?Education Other  Actor Options;Other;Newly Diagnosed Cancer Education Other   ?Interventions Coordination of Care Coordination of Care Education;Psycho-Social Support Coordination of Care;Education Coordination of Care;Education  ?Acuity Level 2-Minimal Needs (1-2 Barriers Identified) Level 2-Minimal Needs (1-2 Barriers Identified) Level 3-Moderate Needs (3-4 Barriers Identified)  Level 2-Minimal Needs (1-2 Barriers Identified) Level 2-Minimal Needs (1-2 Barriers Identified)  ?Coordination of Care Other Other  Appts Other  ?Education Method Verbal  Verbal;Other Verbal Verbal  ?Time Spent with Patient [phone]  ?  ?

## 2021-11-15 ENCOUNTER — Telehealth: Payer: Self-pay | Admitting: Internal Medicine

## 2021-11-15 NOTE — Telephone Encounter (Signed)
.  Called patient to schedule appointment per 4/4 inbasket, patient is aware of date and time.   ?

## 2021-11-16 DIAGNOSIS — M7989 Other specified soft tissue disorders: Secondary | ICD-10-CM | POA: Diagnosis not present

## 2021-11-16 DIAGNOSIS — I83892 Varicose veins of left lower extremities with other complications: Secondary | ICD-10-CM | POA: Diagnosis not present

## 2021-11-16 DIAGNOSIS — I83812 Varicose veins of left lower extremities with pain: Secondary | ICD-10-CM | POA: Diagnosis not present

## 2021-11-17 NOTE — Progress Notes (Shared)
?[facility] Note ? ?11/21/2021 ? ?  ? ?CHIEF COMPLAINT ?Patient presents for No chief complaint on file. ? ? ?HISTORY OF PRESENT ILLNESS: ?Jenny Cardenas is a 71 y.o. female who presents to the clinic today for:  ? ? ?Pt states she has gotten new glasses with bifocals bc she did not like the progressive lenses ? ? ?Referring physician: ?No referring provider defined for this encounter. ? ?HISTORICAL INFORMATION:  ? ?Selected notes from the [facility] ?Referred by Dr. Marin Comment for concern of macular degeneration OU  ? ?CURRENT MEDICATIONS: ?Current Outpatient Medications (Ophthalmic Drugs)  ?Medication Sig  ? cycloSPORINE (RESTASIS) 0.05 % ophthalmic emulsion Place 1 drop into both eyes 2 (two) times daily.  ? ?No current facility-administered medications for this visit. (Ophthalmic Drugs)  ? ?Current Outpatient Medications (Other)  ?Medication Sig  ? albuterol (PROVENTIL) (2.5 MG/3ML) 0.083% nebulizer solution Take 2.5 mg by nebulization every 4 (four) hours as needed for wheezing or shortness of breath.   ? ALPRAZolam (XANAX) 0.5 MG tablet Take 1 tablet (0.5 mg total) by mouth 3 (three) times daily as needed for anxiety.  ? amphetamine-dextroamphetamine (ADDERALL) 10 MG tablet Take 1 tablet (10 mg total) by mouth every morning.  ? buPROPion (WELLBUTRIN XL) 150 MG 24 hr tablet Take 150 mg by mouth daily.  ? diphenhydramine-acetaminophen (TYLENOL PM) 25-500 MG TABS tablet Take 2 tablets by mouth at bedtime as needed.  ? furosemide (LASIX) 20 MG tablet Take 20 mg by mouth daily. May take an additional dose if needed for swelling  ? guaiFENesin (MUCINEX) 600 MG 12 hr tablet Take 1 tablet (600 mg total) by mouth 2 (two) times daily.  ? levothyroxine (SYNTHROID, LEVOTHROID) 25 MCG tablet Take 25 mcg by mouth daily before breakfast.  ? lidocaine (LIDODERM) 5 % Place 1 patch onto the skin daily as needed (irritation). Remove & Discard patch within 12 hours or as directed by MD  ? Multiple  Vitamins-Minerals (PRESERVISION AREDS 2 PO) Take 1 capsule by mouth in the morning and at bedtime.  ? pantoprazole (PROTONIX) 40 MG tablet Take 40 mg by mouth daily.  ? phenol (CHLORASEPTIC) 1.4 % LIQD Use as directed 1 spray in the mouth or throat as needed for throat irritation / pain.  ? pravastatin (PRAVACHOL) 10 MG tablet Take 10 mg by mouth in the morning and at bedtime.  ? sertraline (ZOLOFT) 50 MG tablet Take 100 mg by mouth daily.  ? tiZANidine (ZANAFLEX) 4 MG tablet Take 1 tablet (4 mg total) by mouth every 8 (eight) hours as needed for muscle spasms.  ? traMADol (ULTRAM) 50 MG tablet Take 1 tablet (50 mg total) by mouth every 6 (six) hours as needed (mild pain).  ? ?No current facility-administered medications for this visit. (Other)  ? ?REVIEW OF SYSTEMS: ? ?ALLERGIES ?Allergies  ?Allergen Reactions  ? Ethylene Oxide   ?  Other reaction(s): scarred cornea  ? Other   ?  Etholine Oxide: causes itching and hives, and temporary blindness  ? ?PAST MEDICAL HISTORY ?Past Medical History:  ?Diagnosis Date  ? ADHD (attention deficit hyperactivity disorder) 2020  ? ADD  ? Anxiety   ? Arthritis   ? Broken heart syndrome 2013  ? Carpal tunnel syndrome   ? Cataract   ? Chronic kidney disease   ? Depression   ? Diabetes mellitus without complication ([place])   ? Type 2  ? Dysrhythmia 1976  ? tachycardia  ? Fall 04/10/2015  ?  GERD (gastroesophageal reflux disease)   ? Goiter   ? History of shingles   ? On abdomen, left ring finger, and left leg; Pt takes Valtrex  ? Hyperlipidemia   ? Hypertension   ? Hypothyroidism   ? Obesity   ? PTSD (post-traumatic stress disorder)   ? Scratched cornea 1979  ? from EDTA  ? Shortness of breath dyspnea   ? pt stated its related to the back problems she has  ? Thyroid disease   ? Ulcer   ? ?Past Surgical History:  ?Procedure Laterality Date  ? ABDOMINAL HYSTERECTOMY  1996  ? BRONCHIAL BRUSHINGS  06/29/2021  ? Procedure: BRONCHIAL BRUSHINGS;  Surgeon: Collene Gobble, MD;  [facility]  ENDOSCOPY;  Service: Pulmonary;;  ? BRONCHIAL NEEDLE ASPIRATION BIOPSY  06/29/2021  ? Procedure: BRONCHIAL NEEDLE ASPIRATION BIOPSIES;  Surgeon: Collene Gobble, MD;  Location: [facility] ENDOSCOPY;  Service: Pulmonary;;  ? CATARACT EXTRACTION, BILATERAL    ? [place]  ? COLONOSCOPY W/ POLYPECTOMY    ? EYE SURGERY Bilateral 2014  ? cataratact and lasik surgery  ? FIDUCIAL MARKER PLACEMENT  06/29/2021  ? Procedure: FIDUCIAL DYE MARKING;  Surgeon: Collene Gobble, MD;  Location: [facility] ENDOSCOPY;  Service: Pulmonary;;  ? INTERCOSTAL NERVE BLOCK  06/29/2021  ? Procedure: INTERCOSTAL NERVE BLOCK;  Surgeon: Lajuana Matte, MD;  Location: [place];  Service: Thoracic;;  ? KYPHOPLASTY N/A 06/02/2015  ? Procedure: KYPHOPLASTY;  Surgeon: Phylliss Bob, MD;  Location: [place];  Service: Orthopedics;  Laterality: N/A;  Thoracic 3, 8, 10 kyphoplasty  ? LOBECTOMY  06/29/2021  ? Procedure: RIGHT UPPER LOBECTOMY;  Surgeon: Lajuana Matte, MD;  Location: [place];  Service: Thoracic;;  ? LYMPH NODE DISSECTION  06/29/2021  ? Procedure: LYMPH NODE DISSECTION;  Surgeon: Lajuana Matte, MD;  Location: [place];  Service: Thoracic;;  ? ?FAMILY HISTORY ?Family History  ?Problem Relation Age of Onset  ? COPD Mother   ? Depression Mother   ? Hypertension Mother   ? Hyperlipidemia Mother   ? Breast cancer Mother 63  ? Alzheimer's disease Father   ? Heart disease Father   ? Breast cancer Sister 65  ? Colon cancer Neg Hx   ? ?SOCIAL HISTORY ?Social History  ? ?Tobacco Use  ? Smoking status: Former  ?  Packs/day: 0.50  ?  Years: 4.00  ?  Pack years: 2.00  ?  Types: Cigarettes  ?  Quit date: 06/28/2016  ?  Years since quitting: 5.3  ? Smokeless tobacco: Never  ?Vaping Use  ? Vaping Use: Never used  ?Substance Use Topics  ? Alcohol use: Yes  ?  Comment: rare glass of wine (holidays only)  ? Drug use: No  ?  ? ?  ?OPHTHALMIC EXAM: ?Not recorded ?  ? ?IMAGING AND PROCEDURES  ?Imaging and Procedures for 11/21/2021 ? ? ?  ?  ? ?   ?ASSESSMENT/PLAN: ?  ICD-10-CM   ?1. Early dry stage nonexudative age-related macular degeneration of both eyes  H35.3131   ?  ?2. Diabetes mellitus type 2 without retinopathy ([place])  E11.9   ?  ?3. Pseudophakia, both eyes  Z96.1   ?  ?4. S/P LASIK (laser assisted in situ keratomileusis) of both eyes  Z98.890   ?  ?5. Dry eyes  H04.123   ?  ?1,2. Age related macular degeneration, non-exudative, OU ? - exam and OCT with rare focal drusen OU -- early stage, stable ? -  The incidence, anatomy, and pathology of dry AMD, risk of progression, and the AREDS and AREDS 2 studies including smoking risks discussed with patient. ? - Recommend amsler grid monitoring ? - f/u 6 months, sooner prn -- DFE, OCT ? ?3. Diabetes mellitus, type 2 without retinopathy ?- The incidence, risk factors for progression, natural history and treatment options for diabetic retinopathy  were discussed with patient.   ?- The need for close monitoring of blood glucose, blood pressure, and serum lipids, avoiding cigarette or any type of tobacco, and the need for long term follow up was also discussed with patient. ?- f/u in 1 year, sooner prn ? ?4. Pseudophakia OU ? - s/p CE/IOL OU (Dr. Lucita Ferrara) ? - IOLs in good position, doing well ? - monitor ? ?5. History of Lasik ? - stable ? - mild corneal scarring OS ? ?6. Dry eyes OU ? - recommend artificial tears and lubricating ointment as needed ? ?Ophthalmic Meds Ordered this visit:  ?No orders of the defined types were placed in this encounter. ? ?  ? ?No follow-ups on file. ? ?There are no Patient Instructions on file for this visit. ? ?Explained the diagnoses, plan, and follow up with the patient and they expressed understanding.  Patient expressed understanding of the importance of proper follow up care.  ? ?This document serves as a record of services personally performed by Gardiner Sleeper, MD, PhD. It was created on their behalf by San Jetty. Owens Shark, OA an ophthalmic technician. The creation of this  record is the provider's dictation and/or activities during the visit.   ? ?Electronically signed by: San Jetty. Owens Shark, [place] 04.07.2023 12:53 PM ? ? ?Gardiner Sleeper, M.D., Ph.D. ?[facility]

## 2021-11-21 ENCOUNTER — Encounter (INDEPENDENT_AMBULATORY_CARE_PROVIDER_SITE_OTHER): Payer: Medicare Other | Admitting: Ophthalmology

## 2021-11-21 DIAGNOSIS — H353131 Nonexudative age-related macular degeneration, bilateral, early dry stage: Secondary | ICD-10-CM

## 2021-11-21 DIAGNOSIS — Z9889 Other specified postprocedural states: Secondary | ICD-10-CM

## 2021-11-21 DIAGNOSIS — E119 Type 2 diabetes mellitus without complications: Secondary | ICD-10-CM

## 2021-11-21 DIAGNOSIS — Z961 Presence of intraocular lens: Secondary | ICD-10-CM

## 2021-11-21 DIAGNOSIS — H04123 Dry eye syndrome of bilateral lacrimal glands: Secondary | ICD-10-CM

## 2021-11-24 NOTE — Progress Notes (Shared)
?[facility] Note ? ?11/28/2021 ? ?  ? ?CHIEF COMPLAINT ?Patient presents for No chief complaint on file. ? ? ?HISTORY OF PRESENT ILLNESS: ?Jenny Cardenas is a 71 y.o. female who presents to the clinic today for:  ? ? ?Pt states she has gotten new glasses with bifocals bc she did not like the progressive lenses ? ? ?Referring physician: ?No referring provider defined for this encounter. ? ?HISTORICAL INFORMATION:  ? ?Selected notes from the [facility] ?Referred by Dr. Marin Comment for concern of macular degeneration OU  ? ?CURRENT MEDICATIONS: ?Current Outpatient Medications (Ophthalmic Drugs)  ?Medication Sig  ? cycloSPORINE (RESTASIS) 0.05 % ophthalmic emulsion Place 1 drop into both eyes 2 (two) times daily.  ? ?No current facility-administered medications for this visit. (Ophthalmic Drugs)  ? ?Current Outpatient Medications (Other)  ?Medication Sig  ? albuterol (PROVENTIL) (2.5 MG/3ML) 0.083% nebulizer solution Take 2.5 mg by nebulization every 4 (four) hours as needed for wheezing or shortness of breath.   ? ALPRAZolam (XANAX) 0.5 MG tablet Take 1 tablet (0.5 mg total) by mouth 3 (three) times daily as needed for anxiety.  ? amphetamine-dextroamphetamine (ADDERALL) 10 MG tablet Take 1 tablet (10 mg total) by mouth every morning.  ? buPROPion (WELLBUTRIN XL) 150 MG 24 hr tablet Take 150 mg by mouth daily.  ? diphenhydramine-acetaminophen (TYLENOL PM) 25-500 MG TABS tablet Take 2 tablets by mouth at bedtime as needed.  ? furosemide (LASIX) 20 MG tablet Take 20 mg by mouth daily. May take an additional dose if needed for swelling  ? guaiFENesin (MUCINEX) 600 MG 12 hr tablet Take 1 tablet (600 mg total) by mouth 2 (two) times daily.  ? levothyroxine (SYNTHROID, LEVOTHROID) 25 MCG tablet Take 25 mcg by mouth daily before breakfast.  ? lidocaine (LIDODERM) 5 % Place 1 patch onto the skin daily as needed (irritation). Remove & Discard patch within 12 hours or as directed by MD  ? Multiple  Vitamins-Minerals (PRESERVISION AREDS 2 PO) Take 1 capsule by mouth in the morning and at bedtime.  ? pantoprazole (PROTONIX) 40 MG tablet Take 40 mg by mouth daily.  ? phenol (CHLORASEPTIC) 1.4 % LIQD Use as directed 1 spray in the mouth or throat as needed for throat irritation / pain.  ? pravastatin (PRAVACHOL) 10 MG tablet Take 10 mg by mouth in the morning and at bedtime.  ? sertraline (ZOLOFT) 50 MG tablet Take 100 mg by mouth daily.  ? tiZANidine (ZANAFLEX) 4 MG tablet Take 1 tablet (4 mg total) by mouth every 8 (eight) hours as needed for muscle spasms.  ? traMADol (ULTRAM) 50 MG tablet Take 1 tablet (50 mg total) by mouth every 6 (six) hours as needed (mild pain).  ? ?No current facility-administered medications for this visit. (Other)  ? ?REVIEW OF SYSTEMS: ? ?ALLERGIES ?Allergies  ?Allergen Reactions  ? Ethylene Oxide   ?  Other reaction(s): scarred cornea  ? Other   ?  Etholine Oxide: causes itching and hives, and temporary blindness  ? ?PAST MEDICAL HISTORY ?Past Medical History:  ?Diagnosis Date  ? ADHD (attention deficit hyperactivity disorder) 2020  ? ADD  ? Anxiety   ? Arthritis   ? Broken heart syndrome 2013  ? Carpal tunnel syndrome   ? Cataract   ? Chronic kidney disease   ? Depression   ? Diabetes mellitus without complication ([place])   ? Type 2  ? Dysrhythmia 1976  ? tachycardia  ? Fall 04/10/2015  ?  GERD (gastroesophageal reflux disease)   ? Goiter   ? History of shingles   ? On abdomen, left ring finger, and left leg; Pt takes Valtrex  ? Hyperlipidemia   ? Hypertension   ? Hypothyroidism   ? Obesity   ? PTSD (post-traumatic stress disorder)   ? Scratched cornea 1979  ? from EDTA  ? Shortness of breath dyspnea   ? pt stated its related to the back problems she has  ? Thyroid disease   ? Ulcer   ? ?Past Surgical History:  ?Procedure Laterality Date  ? ABDOMINAL HYSTERECTOMY  1996  ? BRONCHIAL BRUSHINGS  06/29/2021  ? Procedure: BRONCHIAL BRUSHINGS;  Surgeon: Collene Gobble, MD;  Location: [facility]  ENDOSCOPY;  Service: Pulmonary;;  ? BRONCHIAL NEEDLE ASPIRATION BIOPSY  06/29/2021  ? Procedure: BRONCHIAL NEEDLE ASPIRATION BIOPSIES;  Surgeon: Collene Gobble, MD;  Location: [facility] ENDOSCOPY;  Service: Pulmonary;;  ? CATARACT EXTRACTION, BILATERAL    ? [place]  ? COLONOSCOPY W/ POLYPECTOMY    ? EYE SURGERY Bilateral 2014  ? cataratact and lasik surgery  ? FIDUCIAL MARKER PLACEMENT  06/29/2021  ? Procedure: FIDUCIAL DYE MARKING;  Surgeon: Collene Gobble, MD;  Location: [facility] ENDOSCOPY;  Service: Pulmonary;;  ? INTERCOSTAL NERVE BLOCK  06/29/2021  ? Procedure: INTERCOSTAL NERVE BLOCK;  Surgeon: Lajuana Matte, MD;  Location: [place];  Service: Thoracic;;  ? KYPHOPLASTY N/A 06/02/2015  ? Procedure: KYPHOPLASTY;  Surgeon: Phylliss Bob, MD;  Location: [place];  Service: Orthopedics;  Laterality: N/A;  Thoracic 3, 8, 10 kyphoplasty  ? LOBECTOMY  06/29/2021  ? Procedure: RIGHT UPPER LOBECTOMY;  Surgeon: Lajuana Matte, MD;  Location: [place];  Service: Thoracic;;  ? LYMPH NODE DISSECTION  06/29/2021  ? Procedure: LYMPH NODE DISSECTION;  Surgeon: Lajuana Matte, MD;  Location: [place];  Service: Thoracic;;  ? ?FAMILY HISTORY ?Family History  ?Problem Relation Age of Onset  ? COPD Mother   ? Depression Mother   ? Hypertension Mother   ? Hyperlipidemia Mother   ? Breast cancer Mother 60  ? Alzheimer's disease Father   ? Heart disease Father   ? Breast cancer Sister 80  ? Colon cancer Neg Hx   ? ?SOCIAL HISTORY ?Social History  ? ?Tobacco Use  ? Smoking status: Former  ?  Packs/day: 0.50  ?  Years: 4.00  ?  Pack years: 2.00  ?  Types: Cigarettes  ?  Quit date: 06/28/2016  ?  Years since quitting: 5.4  ? Smokeless tobacco: Never  ?Vaping Use  ? Vaping Use: Never used  ?Substance Use Topics  ? Alcohol use: Yes  ?  Comment: rare glass of wine (holidays only)  ? Drug use: No  ?  ? ?  ?OPHTHALMIC EXAM: ?Not recorded ?  ? ?IMAGING AND PROCEDURES  ?Imaging and Procedures for 11/28/2021 ? ? ?  ?  ? ?   ?ASSESSMENT/PLAN: ?  ICD-10-CM   ?1. Early dry stage nonexudative age-related macular degeneration of both eyes  H35.3131   ?  ?2. Diabetes mellitus type 2 without retinopathy ([place])  E11.9   ?  ?3. Pseudophakia, both eyes  Z96.1   ?  ?4. S/P LASIK (laser assisted in situ keratomileusis) of both eyes  Z98.890   ?  ?5. Dry eyes  H04.123   ?  ?1,2. Age related macular degeneration, non-exudative, OU ? - exam and OCT with rare focal drusen OU -- early stage, stable ? -  The incidence, anatomy, and pathology of dry AMD, risk of progression, and the AREDS and AREDS 2 studies including smoking risks discussed with patient. ? - Recommend amsler grid monitoring ? - f/u 6 months, sooner prn -- DFE, OCT ? ?3. Diabetes mellitus, type 2 without retinopathy ?- The incidence, risk factors for progression, natural history and treatment options for diabetic retinopathy  were discussed with patient.   ?- The need for close monitoring of blood glucose, blood pressure, and serum lipids, avoiding cigarette or any type of tobacco, and the need for long term follow up was also discussed with patient. ?- f/u in 1 year, sooner prn ? ?4. Pseudophakia OU ? - s/p CE/IOL OU (Dr. Lucita Ferrara) ? - IOLs in good position, doing well ? - monitor ? ?5. History of Lasik ? - stable ? - mild corneal scarring OS ? ?6. Dry eyes OU ? - recommend artificial tears and lubricating ointment as needed ? ?Ophthalmic Meds Ordered this visit:  ?No orders of the defined types were placed in this encounter. ? ?  ? ?No follow-ups on file. ? ?There are no Patient Instructions on file for this visit. ? ?Explained the diagnoses, plan, and follow up with the patient and they expressed understanding.  Patient expressed understanding of the importance of proper follow up care.  ? ?This document serves as a record of services personally performed by Gardiner Sleeper, MD, PhD. It was created on their behalf by San Jetty. Owens Shark, OA an ophthalmic technician. The creation of this  record is the provider's dictation and/or activities during the visit.   ? ?Electronically signed by: San Jetty. Owens Shark, [place] 04.14.2023 12:25 PM ? ? ?Gardiner Sleeper, M.D., Ph.D. ?[facility]

## 2021-11-28 ENCOUNTER — Encounter (INDEPENDENT_AMBULATORY_CARE_PROVIDER_SITE_OTHER): Payer: Medicare Other | Admitting: Ophthalmology

## 2021-11-28 DIAGNOSIS — Z20822 Contact with and (suspected) exposure to covid-19: Secondary | ICD-10-CM | POA: Diagnosis not present

## 2021-11-28 DIAGNOSIS — Z9889 Other specified postprocedural states: Secondary | ICD-10-CM

## 2021-11-28 DIAGNOSIS — H353131 Nonexudative age-related macular degeneration, bilateral, early dry stage: Secondary | ICD-10-CM

## 2021-11-28 DIAGNOSIS — E119 Type 2 diabetes mellitus without complications: Secondary | ICD-10-CM

## 2021-11-28 DIAGNOSIS — Z961 Presence of intraocular lens: Secondary | ICD-10-CM

## 2021-11-28 DIAGNOSIS — H04123 Dry eye syndrome of bilateral lacrimal glands: Secondary | ICD-10-CM

## 2021-12-12 DIAGNOSIS — I83811 Varicose veins of right lower extremities with pain: Secondary | ICD-10-CM | POA: Diagnosis not present

## 2021-12-12 DIAGNOSIS — M7989 Other specified soft tissue disorders: Secondary | ICD-10-CM | POA: Diagnosis not present

## 2021-12-12 DIAGNOSIS — I83891 Varicose veins of right lower extremities with other complications: Secondary | ICD-10-CM | POA: Diagnosis not present

## 2021-12-19 DIAGNOSIS — I872 Venous insufficiency (chronic) (peripheral): Secondary | ICD-10-CM | POA: Diagnosis not present

## 2022-01-03 ENCOUNTER — Encounter: Payer: Self-pay | Admitting: *Deleted

## 2022-01-03 NOTE — Progress Notes (Signed)
Oncology Nurse Navigator Documentation     01/03/2022   12:00 PM 11/14/2021    9:00 AM 09/29/2021    4:00 PM 09/19/2021    3:00 PM 08/28/2021    3:00 PM 08/21/2021   11:00 AM  Oncology Nurse Navigator Flowsheets  Abnormal Finding Date      03/30/2021  Confirmed Diagnosis Date      06/29/2021  Diagnosis Status      Confirmed Diagnosis Complete  Planned Course of Treatment      Surgery  Phase of Treatment   Surgery   Surgery  Surgery Actual Start Date:   08/18/2021     Navigator Follow Up Date:     09/19/2021 08/23/2021  Navigator Follow Up Reason:     New Patient Appointment Patient Call  Navigation Complete Date:   09/29/2021     Post Navigation: Continue to Follow Patient?   No     Reason Not Navigating Patient:   No Treatment, Observation Only     Navigator Location [facility]  Referral Date to RadOnc/MedOnc      08/18/2021  Navigator Encounter Type Telephone Telephone Other: Clinic/[facility] Telephone Telephone  Telephone Incoming Call Incoming Call   Outgoing Call Outgoing Call  Treatment Initiated Date      06/29/2021  Patient Visit Type   Other Initial;MedOnc Initial Initial  Treatment Phase   Other Other  Other  Barriers/Navigation Needs Education/I received a call from Jenny Cardenas.  She needed an update on upcoming appts. I clarified questions.  She was thankful for the assistance.  Coordination of Care Coordination of Care Education Coordination of Care;Education Coordination of Care  Education Other Other  Understanding Cancer/ Treatment Options;Other;Newly Diagnosed Cancer Education Other   Interventions Education;Psycho-Social Support Coordination of Care Coordination of Care Education;Psycho-Social Support Coordination of Care;Education Coordination of Care;Education  Acuity Level 2-Minimal Needs (1-2 Barriers Identified) Level 2-Minimal Needs (1-2 Barriers Identified) Level 2-Minimal Needs (1-2 Barriers  Identified) Level 3-Moderate Needs (3-4 Barriers Identified) Level 2-Minimal Needs (1-2 Barriers Identified) Level 2-Minimal Needs (1-2 Barriers Identified)  Coordination of Care  Other Other  Appts Other  Education Method Verbal Verbal  Verbal;Other Verbal Verbal  Time Spent with Patient 30 30 30  45 30 30

## 2022-01-05 NOTE — Progress Notes (Signed)
[facility] Note  01/10/2022     CHIEF COMPLAINT Patient presents for Retina Follow Up   HISTORY OF PRESENT ILLNESS: Jenny Cardenas is a 71 y.o. female who presents to the clinic today for:   HPI     Retina Follow Up           Diagnosis: Dry AMD   Laterality: both eyes   Onset: 7 months ago         Comments   Patient here for 7 months retina follow up for non exu ARMD OU. Patient states vision pretty good. Eyes are sore. Usually really tired. Uses restasis BID OU.       Last edited by Theodore Demark, COA on 01/10/2022  1:42 PM.       Referring physician: No referring provider defined for this encounter.  HISTORICAL INFORMATION:   Selected notes from the MEDICAL RECORD NUMBER Referred by Dr. Marin Comment for concern of macular degeneration OU   CURRENT MEDICATIONS: Current Outpatient Medications (Ophthalmic Drugs)  Medication Sig   cycloSPORINE (RESTASIS) 0.05 % ophthalmic emulsion Place 1 drop into both eyes 2 (two) times daily.   No current facility-administered medications for this visit. (Ophthalmic Drugs)   Current Outpatient Medications (Other)  Medication Sig   albuterol (PROVENTIL) (2.5 MG/3ML) 0.083% nebulizer solution Take 2.5 mg by nebulization every 4 (four) hours as needed for wheezing or shortness of breath.    ALPRAZolam (XANAX) 0.5 MG tablet Take 1 tablet (0.5 mg total) by mouth 3 (three) times daily as needed for anxiety.   amphetamine-dextroamphetamine (ADDERALL) 10 MG tablet Take 1 tablet (10 mg total) by mouth every morning.   buPROPion (WELLBUTRIN XL) 150 MG 24 hr tablet Take 150 mg by mouth daily.   diphenhydramine-acetaminophen (TYLENOL PM) 25-500 MG TABS tablet Take 2 tablets by mouth at bedtime as needed.   furosemide (LASIX) 20 MG tablet Take 20 mg by mouth daily. May take an additional dose if needed for swelling   guaiFENesin (MUCINEX) 600 MG 12 hr tablet Take 1 tablet (600 mg total) by mouth 2 (two) times daily.    levothyroxine (SYNTHROID, LEVOTHROID) 25 MCG tablet Take 25 mcg by mouth daily before breakfast.   lidocaine (LIDODERM) 5 % Place 1 patch onto the skin daily as needed (irritation). Remove & Discard patch within 12 hours or as directed by MD   Multiple Vitamins-Minerals (PRESERVISION AREDS 2 PO) Take 1 capsule by mouth in the morning and at bedtime.   pantoprazole (PROTONIX) 40 MG tablet Take 40 mg by mouth daily.   phenol (CHLORASEPTIC) 1.4 % LIQD Use as directed 1 spray in the mouth or throat as needed for throat irritation / pain.   pravastatin (PRAVACHOL) 10 MG tablet Take 10 mg by mouth in the morning and at bedtime.   sertraline (ZOLOFT) 50 MG tablet Take 100 mg by mouth daily.   tiZANidine (ZANAFLEX) 4 MG tablet Take 1 tablet (4 mg total) by mouth every 8 (eight) hours as needed for muscle spasms.   traMADol (ULTRAM) 50 MG tablet Take 1 tablet (50 mg total) by mouth every 6 (six) hours as needed (mild pain).   No current facility-administered medications for this visit. (Other)   REVIEW OF SYSTEMS: ROS   Positive for: Gastrointestinal, Musculoskeletal, Endocrine, Eyes, Psychiatric Negative for: Constitutional, Neurological, Skin, Genitourinary, HENT, Cardiovascular, Respiratory, Allergic/Imm, Heme/Lymph Last edited by Theodore Demark, COA on 01/10/2022  1:42 PM.     ALLERGIES Allergies  Allergen Reactions   Ethylene Oxide     Other reaction(s): scarred cornea   Other     Etholine Oxide: causes itching and hives, and temporary blindness   PAST MEDICAL HISTORY Past Medical History:  Diagnosis Date   ADHD (attention deficit hyperactivity disorder) 2020   ADD   Anxiety    Arthritis    Broken heart syndrome 2013   Carpal tunnel syndrome    Cataract    Chronic kidney disease    Depression    Diabetes mellitus without complication ([place])    Type 2   Dysrhythmia 1976   tachycardia   Fall 04/10/2015   GERD (gastroesophageal reflux disease)    Goiter    History of  shingles    On abdomen, left ring finger, and left leg; Pt takes Valtrex   Hyperlipidemia    Hypertension    Hypothyroidism    Obesity    PTSD (post-traumatic stress disorder)    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   Shortness of breath dyspnea    pt stated its related to the back problems she has   Thyroid disease    Ulcer    Past Surgical History:  Procedure Laterality Date   ABDOMINAL HYSTERECTOMY  1996   BRONCHIAL BRUSHINGS  06/29/2021   Procedure: BRONCHIAL BRUSHINGS;  Surgeon: Collene Gobble, MD;  Location: [facility] ENDOSCOPY;  Service: Pulmonary;;   BRONCHIAL NEEDLE ASPIRATION BIOPSY  06/29/2021   Procedure: BRONCHIAL NEEDLE ASPIRATION BIOPSIES;  Surgeon: Collene Gobble, MD;  Location: [facility] ENDOSCOPY;  Service: Pulmonary;;   CATARACT EXTRACTION, BILATERAL     CESAREAN [place], 1984   COLONOSCOPY W/ POLYPECTOMY     EYE SURGERY Bilateral 2014   cataratact and lasik surgery   FIDUCIAL MARKER PLACEMENT  06/29/2021   Procedure: FIDUCIAL DYE MARKING;  Surgeon: Collene Gobble, MD;  Location: [facility] ENDOSCOPY;  Service: Pulmonary;;   INTERCOSTAL NERVE BLOCK  06/29/2021   Procedure: INTERCOSTAL NERVE BLOCK;  Surgeon: Lajuana Matte, MD;  Location: [place];  Service: Thoracic;;   KYPHOPLASTY N/A 06/02/2015   Procedure: KYPHOPLASTY;  Surgeon: Phylliss Bob, MD;  Location: [place];  Service: Orthopedics;  Laterality: N/A;  Thoracic 3, 8, 10 kyphoplasty   LOBECTOMY  06/29/2021   Procedure: RIGHT UPPER LOBECTOMY;  Surgeon: Lajuana Matte, MD;  Location: [place];  Service: Thoracic;;   LYMPH NODE DISSECTION  06/29/2021   Procedure: LYMPH NODE DISSECTION;  Surgeon: Lajuana Matte, MD;  Location: [facility] OR;  Service: Thoracic;;   FAMILY HISTORY Family History  Problem Relation Age of Onset   COPD Mother    Depression Mother    Hypertension Mother    Hyperlipidemia Mother    Breast cancer Mother 60   Alzheimer's disease Father    Heart disease Father    Breast cancer  Sister 20   Colon cancer Neg Hx    SOCIAL HISTORY Social History   Tobacco Use   Smoking status: Former    Packs/day: 0.50    Years: 4.00    Pack years: 2.00    Types: Cigarettes    Quit date: 06/28/2016    Years since quitting: 5.5   Smokeless tobacco: Never  Vaping Use   Vaping Use: Never used  Substance Use Topics   Alcohol use: Yes    Comment: rare glass of wine (holidays only)   Drug use: No       OPHTHALMIC EXAM: Base Eye Exam     Visual  Acuity (Snellen - Linear)       Right Left   Dist cc 20/30 20/20 -1   Dist ph cc 20/25 -2     Correction: Glasses         Tonometry (Tonopen, 1:39 PM)       Right Left   Pressure 16 20         Pupils       Dark Light Shape React APD   Right 3 2 Round Brisk None   Left 3 2 Round Brisk None         Visual Fields (Counting fingers)       Left Right    Full Full         Extraocular Movement       Right Left    Full, Ortho Full, Ortho         Neuro/Psych     Oriented x3: Yes   Mood/Affect: Normal         Dilation     Both eyes: 1.0% Mydriacyl, 2.5% Phenylephrine @ 1:38 PM           Slit Lamp and Fundus Exam     Slit Lamp Exam       Right Left   Lids/Lashes Mild Dermatochalasis - upper lid, Meibomian gland dysfunction Mild Dermatochalasis - upper lid, Meibomian gland dysfunction   Conjunctiva/Sclera prominent epi scleral vessels temporally prominent epi scleral vessels temporally   Cornea Mild arcus, trace Punctate epithelial erosions, well healed cataract wounds, superior / inferior LRIs, barely visible lasik flap Mild arcus, trace Punctate epithelial erosions, well healed cataract wounds, superior / inferior LRIs, well healed lasik flap, mild corneal haze / Salzmann's nodule at 0400   Anterior Chamber Deep and quiet Deep and quiet   Iris Round and dilated Round and dilated   Lens PC IOL in good position with open PC PC IOL in good position with open PC   Vitreous Vitreous syneresis,  Posterior vitreous detachment, mild vitreous condensations Vitreous syneresis, Posterior vitreous detachment, vitreous condensations         Fundus Exam       Right Left   Disc Pink and Sharp mild Pallor, Sharp rim   C/D Ratio 0.2 0.3   Macula Flat, Good foveal reflex, rare Drusen, mild RPE mottling Flat, Good foveal reflex, +Drusen, mild RPE mottling   Vessels mild attenuation mild attenuation   Periphery Attached, mild reticular degeneration Attached, mild reticular degeneration, No heme            Refraction     Wearing Rx       Sphere Cylinder Axis Add   Right +0.50 +0.75 085 +3.75   Left [place] +0.75 085 +3.75           IMAGING AND PROCEDURES  Imaging and Procedures for 01/10/2022  OCT, Retina - OU - Both Eyes       Right Eye Quality was good. Central Foveal Thickness: 286. Progression has been stable. Findings include normal foveal contour, no IRF, no SRF, retinal drusen (Rare drusen nasal macula / peripapillary).   Left Eye Quality was good. Central Foveal Thickness: 289. Progression has been stable. Findings include normal foveal contour, no IRF, no SRF, retinal drusen (Rare, focal drusen).   Notes *Images captured and stored on drive  Diagnosis / Impression:  NFP, no IRF/SRF OU rare focal drusen / early non-exu ARMD OU  Clinical management:  See below  Abbreviations: NFP - Normal foveal profile. CME -  cystoid macular edema. PED - pigment epithelial detachment. IRF - intraretinal fluid. SRF - subretinal fluid. EZ - ellipsoid zone. ERM - epiretinal membrane. ORA - outer retinal atrophy. ORT - outer retinal tubulation. SRHM - subretinal hyper-reflective material. IRHM - intraretinal hyper-reflective material             ASSESSMENT/PLAN:   ICD-10-CM   1. Early dry stage nonexudative age-related macular degeneration of both eyes  H35.3131 OCT, Retina - OU - Both Eyes    2. Diabetes mellitus type 2 without retinopathy ([place])  E11.9     3.  Pseudophakia, both eyes  Z96.1     4. S/P LASIK (laser assisted in situ keratomileusis) of both eyes  Z98.890     5. Dry eyes  H04.123      1,2. Age related macular degeneration, non-exudative, OU  - exam and OCT with rare focal drusen OU -- early stage, stable  - The incidence, anatomy, and pathology of dry AMD, risk of progression, and the AREDS and AREDS 2 studies including smoking risks discussed with patient.  - Recommend amsler grid monitoring   - f/u 6 months, sooner prn -- DFE, OCT  3. Diabetes mellitus, type 2 without retinopathy - The incidence, risk factors for progression, natural history and treatment options for diabetic retinopathy  were discussed with patient.   - The need for close monitoring of blood glucose, blood pressure, and serum lipids, avoiding cigarette or any type of tobacco, and the need for long term follow up was also discussed with patient. - f/u in 1 year, sooner prn  4. Pseudophakia OU  - s/p CE/IOL OU (Dr. Lucita Ferrara)  - IOLs in good position, doing well  - monitor  5. History of Lasik  - stable  - mild corneal scarring OS  6. Dry eyes OU  - recommend art tears and lubricating ointment as needed  Ophthalmic Meds Ordered this visit:  No orders of the defined types were placed in this encounter.     Return for f/u 6-9 months, non-exu ARMD OU, DFE, OCT.  There are no Patient Instructions on file for this visit.  Explained the diagnoses, plan, and follow up with the patient and they expressed understanding.  Patient expressed understanding of the importance of proper follow up care.   This document serves as a record of services personally performed by Gardiner Sleeper, MD, PhD. It was created on their behalf by Orvan Falconer, an ophthalmic technician. The creation of this record is the provider's dictation and/or activities during the visit.    Electronically signed by: Orvan Falconer, OA, 01/10/22  2:30 PM  This document serves as a  record of services personally performed by Gardiner Sleeper, MD, PhD. It was created on their behalf by San Jetty. Owens Shark, OA an ophthalmic technician. The creation of this record is the provider's dictation and/or activities during the visit.    Electronically signed by: San Jetty. [place], [place] 05.31.2023 2:30 PM    Gardiner Sleeper, M.D., Ph.D. Diseases & Surgery of the Retina and Vitreous [facility]: M myopia (nearsighted); A astigmatism; H hyperopia (farsighted); P presbyopia; Mrx spectacle prescription;  CTL contact lenses; OD right eye; OS left eye; OU both eyes  XT exotropia; ET esotropia; PEK punctate epithelial keratitis; PEE punctate epithelial erosions; DES dry eye syndrome; MGD meibomian gland dysfunction; ATs artificial tears; PFAT's preservative free artificial tears; [place] nuclear sclerotic cataract; PSC posterior subcapsular cataract; ERM epi-retinal  membrane; PVD posterior vitreous detachment; RD retinal detachment; DM diabetes mellitus; DR diabetic retinopathy; NPDR non-proliferative diabetic retinopathy; PDR proliferative diabetic retinopathy; CSME clinically significant macular edema; DME diabetic macular edema; dbh dot blot hemorrhages; CWS cotton wool spot; POAG primary open angle glaucoma; C/D cup-to-disc ratio; HVF humphrey visual field; GVF goldmann visual field; OCT optical coherence tomography; IOP intraocular pressure; BRVO Branch retinal vein occlusion; CRVO central retinal vein occlusion; CRAO central retinal artery occlusion; BRAO branch retinal artery occlusion; RT retinal tear; SB scleral buckle; PPV pars plana vitrectomy; VH Vitreous hemorrhage; PRP panretinal laser photocoagulation; IVK intravitreal kenalog; VMT vitreomacular traction; MH Macular hole;  NVD neovascularization of the disc; NVE neovascularization elsewhere; AREDS age related eye disease study; ARMD age related macular degeneration; POAG primary open angle glaucoma; EBMD  epithelial/anterior basement membrane dystrophy; ACIOL anterior chamber intraocular lens; IOL intraocular lens; PCIOL posterior chamber intraocular lens; Phaco/IOL phacoemulsification with intraocular lens placement; [place] photorefractive keratectomy; LASIK laser assisted in situ keratomileusis; HTN hypertension; DM diabetes mellitus; COPD chronic obstructive pulmonary disease

## 2022-01-10 ENCOUNTER — Ambulatory Visit (INDEPENDENT_AMBULATORY_CARE_PROVIDER_SITE_OTHER): Payer: Medicare Other | Admitting: Ophthalmology

## 2022-01-10 ENCOUNTER — Encounter (INDEPENDENT_AMBULATORY_CARE_PROVIDER_SITE_OTHER): Payer: Self-pay | Admitting: Ophthalmology

## 2022-01-10 DIAGNOSIS — Z961 Presence of intraocular lens: Secondary | ICD-10-CM | POA: Diagnosis not present

## 2022-01-10 DIAGNOSIS — H353131 Nonexudative age-related macular degeneration, bilateral, early dry stage: Secondary | ICD-10-CM | POA: Diagnosis not present

## 2022-01-10 DIAGNOSIS — H04123 Dry eye syndrome of bilateral lacrimal glands: Secondary | ICD-10-CM

## 2022-01-10 DIAGNOSIS — E119 Type 2 diabetes mellitus without complications: Secondary | ICD-10-CM

## 2022-01-10 DIAGNOSIS — Z9889 Other specified postprocedural states: Secondary | ICD-10-CM

## 2022-01-12 ENCOUNTER — Encounter (INDEPENDENT_AMBULATORY_CARE_PROVIDER_SITE_OTHER): Payer: Self-pay | Admitting: Ophthalmology

## 2022-01-16 DIAGNOSIS — I87391 Chronic venous hypertension (idiopathic) with other complications of right lower extremity: Secondary | ICD-10-CM | POA: Diagnosis not present

## 2022-01-16 DIAGNOSIS — I83891 Varicose veins of right lower extremities with other complications: Secondary | ICD-10-CM | POA: Diagnosis not present

## 2022-03-01 ENCOUNTER — Telehealth: Payer: Medicare Other

## 2022-03-01 ENCOUNTER — Ambulatory Visit: Payer: Medicare Other | Admitting: Orthopedic Surgery

## 2022-03-01 NOTE — Telephone Encounter (Signed)
Patient came in and needed medications added to chart before new patient appointment.

## 2022-03-19 ENCOUNTER — Other Ambulatory Visit: Payer: Medicare Other

## 2022-03-20 ENCOUNTER — Encounter (HOSPITAL_COMMUNITY): Payer: Self-pay

## 2022-03-20 ENCOUNTER — Inpatient Hospital Stay: Payer: Medicare Other | Attending: Internal Medicine

## 2022-03-20 ENCOUNTER — Ambulatory Visit (HOSPITAL_COMMUNITY)
Admission: RE | Admit: 2022-03-20 | Discharge: 2022-03-20 | Disposition: A | Discharge status: Home / Self Care | Payer: Medicare Other | Source: Ambulatory Visit | Attending: Internal Medicine | Admitting: Internal Medicine

## 2022-03-20 ENCOUNTER — Other Ambulatory Visit: Payer: Self-pay

## 2022-03-20 DIAGNOSIS — R062 Wheezing: Secondary | ICD-10-CM | POA: Insufficient documentation

## 2022-03-20 DIAGNOSIS — R911 Solitary pulmonary nodule: Secondary | ICD-10-CM | POA: Diagnosis not present

## 2022-03-20 DIAGNOSIS — C3490 Malignant neoplasm of unspecified part of unspecified bronchus or lung: Secondary | ICD-10-CM

## 2022-03-20 DIAGNOSIS — R0602 Shortness of breath: Secondary | ICD-10-CM | POA: Insufficient documentation

## 2022-03-20 DIAGNOSIS — Z902 Acquired absence of lung [part of]: Secondary | ICD-10-CM | POA: Insufficient documentation

## 2022-03-20 DIAGNOSIS — Z85118 Personal history of other malignant neoplasm of bronchus and lung: Secondary | ICD-10-CM | POA: Insufficient documentation

## 2022-03-20 DIAGNOSIS — Z79899 Other long term (current) drug therapy: Secondary | ICD-10-CM | POA: Insufficient documentation

## 2022-03-20 LAB — CBC WITH DIFFERENTIAL (CANCER CENTER ONLY)
Abs Immature Granulocytes: 0.02 10*3/uL (ref 0.00–0.07)
Basophils Absolute: 0 10*3/uL (ref 0.0–0.1)
Basophils Relative: 1 %
Eosinophils Absolute: 0.3 10*3/uL (ref 0.0–0.5)
Eosinophils Relative: 3 %
HCT: 38.1 % (ref 36.0–46.0)
Hemoglobin: 12.2 g/dL (ref 12.0–15.0)
Immature Granulocytes: 0 %
Lymphocytes Relative: 24 %
Lymphs Abs: 2.1 10*3/uL (ref 0.7–4.0)
MCH: 27 pg (ref 26.0–34.0)
MCHC: 32 g/dL (ref 30.0–36.0)
MCV: 84.3 fL (ref 80.0–100.0)
Monocytes Absolute: 0.4 10*3/uL (ref 0.1–1.0)
Monocytes Relative: 5 %
Neutro Abs: 5.9 10*3/uL (ref 1.7–7.7)
Neutrophils Relative %: 67 %
Platelet Count: 296 10*3/uL (ref 150–400)
RBC: 4.52 MIL/uL (ref 3.87–5.11)
RDW: 13.6 % (ref 11.5–15.5)
WBC Count: 8.7 10*3/uL (ref 4.0–10.5)
nRBC: 0 % (ref 0.0–0.2)

## 2022-03-20 LAB — CMP (CANCER CENTER ONLY)
ALT: 11 U/L (ref 0–44)
AST: 14 U/L — ABNORMAL LOW (ref 15–41)
Albumin: 4.2 g/dL (ref 3.5–5.0)
Alkaline Phosphatase: 109 U/L (ref 38–126)
Anion gap: 7 (ref 5–15)
BUN: 15 mg/dL (ref 8–23)
CO2: 27 mmol/L (ref 22–32)
Calcium: 8.6 mg/dL — ABNORMAL LOW (ref 8.9–10.3)
Chloride: 109 mmol/L (ref 98–111)
Creatinine: 1.05 mg/dL — ABNORMAL HIGH (ref 0.44–1.00)
GFR, Estimated: 56 mL/min — ABNORMAL LOW (ref >60)
Glucose, Bld: 133 mg/dL — ABNORMAL HIGH (ref 70–99)
Potassium: 4.1 mmol/L (ref 3.5–5.1)
Sodium: 143 mmol/L (ref 135–145)
Total Bilirubin: 0.3 mg/dL (ref 0.3–1.2)
Total Protein: 7.2 g/dL (ref 6.5–8.1)

## 2022-03-20 MED ORDER — IOHEXOL 300 MG/ML  SOLN
75.0000 mL | Freq: Once | INTRAMUSCULAR | Status: AC | PRN
  Administered 2022-03-20: 75 mL via INTRAVENOUS

## 2022-03-21 ENCOUNTER — Ambulatory Visit: Payer: Medicare Other | Admitting: Internal Medicine

## 2022-03-22 ENCOUNTER — Other Ambulatory Visit: Payer: Self-pay

## 2022-03-22 ENCOUNTER — Inpatient Hospital Stay (HOSPITAL_BASED_OUTPATIENT_CLINIC_OR_DEPARTMENT_OTHER): Payer: Medicare Other | Admitting: Internal Medicine

## 2022-03-22 VITALS — BP 140/78 | HR 84 | Temp 98.3°F | Resp 16 | Wt 178.6 lb

## 2022-03-22 DIAGNOSIS — R0602 Shortness of breath: Secondary | ICD-10-CM | POA: Diagnosis not present

## 2022-03-22 DIAGNOSIS — C3490 Malignant neoplasm of unspecified part of unspecified bronchus or lung: Secondary | ICD-10-CM | POA: Diagnosis not present

## 2022-03-22 DIAGNOSIS — Z902 Acquired absence of lung [part of]: Secondary | ICD-10-CM | POA: Diagnosis not present

## 2022-03-22 DIAGNOSIS — R062 Wheezing: Secondary | ICD-10-CM | POA: Diagnosis not present

## 2022-03-22 DIAGNOSIS — Z79899 Other long term (current) drug therapy: Secondary | ICD-10-CM | POA: Diagnosis not present

## 2022-03-22 DIAGNOSIS — Z85118 Personal history of other malignant neoplasm of bronchus and lung: Secondary | ICD-10-CM | POA: Diagnosis not present

## 2022-03-22 MED ORDER — ALBUTEROL SULFATE HFA 108 (90 BASE) MCG/ACT IN AERS: 1.0000 | INHALATION_SPRAY | Freq: Four times a day (QID) | RESPIRATORY_TRACT | 0 refills | Status: DC | PRN

## 2022-03-22 NOTE — Progress Notes (Signed)
[place] [phone]   [phone]  OFFICE PROGRESS NOTE  Pcp, No No address on file  DIAGNOSIS: Stage IA (T1 a, N0, M0) non-small cell lung cancer, adenocarcinoma  PRIOR THERAPY:  status post right upper lobectomy with lymph node dissection under the care of Dr. Kipp Brood on June 29, 2021.  The tumor size was 0.8 cm.  CURRENT THERAPY: Observation.  INTERVAL HISTORY: Jenny Cardenas 71 y.o. female returns to the clinic today for follow-up visit accompanied by her daughter.  The patient is feeling fine today with no concerning complaints except for shortness of breath with exertion as well as intermittent wheezing secondary to history of asthma.  She has been out of her medication because of change and primary care provider.  Her previous primary care left the practice.  She denied having any current nausea, vomiting, diarrhea or constipation.  She has no headache or visual changes.  She had repeat CT scan of the chest performed recently and she is here for evaluation and discussion of her scan results.  MEDICAL HISTORY: Past Medical History:  Diagnosis Date   ADHD (attention deficit hyperactivity disorder) 2020   ADD   Anxiety    Arthritis    Broken heart syndrome 2013   Carpal tunnel syndrome    Cataract    Chronic kidney disease    Depression    Diabetes mellitus without complication ([place])    Type 2   Dysrhythmia 1976   tachycardia   Fall 04/10/2015   GERD (gastroesophageal reflux disease)    Goiter    History of shingles    On abdomen, left ring finger, and left leg; Pt takes Valtrex   Hyperlipidemia    Hypertension    Hypothyroidism    lung ca 04/2021   Obesity    PTSD (post-traumatic stress disorder)    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   Shortness of breath dyspnea    pt stated its related to the back problems she has   Thyroid disease    Ulcer     ALLERGIES:  is allergic to ethylene oxide and  other.  MEDICATIONS:  Current Outpatient Medications  Medication Sig Dispense Refill   albuterol (PROVENTIL) (2.5 MG/3ML) 0.083% nebulizer solution Take 2.5 mg by nebulization every 4 (four) hours as needed for wheezing or shortness of breath.   10   ALPRAZolam (XANAX) 0.5 MG tablet Take 1 tablet (0.5 mg total) by mouth 3 (three) times daily as needed for anxiety. 10 tablet 0   amphetamine-dextroamphetamine (ADDERALL) 10 MG tablet Take 1 tablet (10 mg total) by mouth every morning. 10 tablet 0   buPROPion (WELLBUTRIN XL) 150 MG 24 hr tablet Take 150 mg by mouth daily.     cycloSPORINE (RESTASIS) 0.05 % ophthalmic emulsion Place 1 drop into both eyes 2 (two) times daily.     diphenhydramine-acetaminophen (TYLENOL PM) 25-500 MG TABS tablet Take 2 tablets by mouth at bedtime as needed.     furosemide (LASIX) 20 MG tablet Take 20 mg by mouth daily. May take an additional dose if needed for swelling     guaiFENesin (MUCINEX) 600 MG 12 hr tablet Take 1 tablet (600 mg total) by mouth 2 (two) times daily.     ibuprofen (ADVIL) 200 MG tablet Take 200 mg by mouth as needed.     levothyroxine (SYNTHROID, LEVOTHROID) 25 MCG tablet Take 25 mcg by mouth daily before breakfast.     lidocaine (LIDODERM)  5 % Place 1 patch onto the skin daily as needed (irritation). Remove & Discard patch within 12 hours or as directed by MD     Multiple Vitamins-Minerals (PRESERVISION AREDS 2 PO) Take 1 capsule by mouth in the morning and at bedtime.     pantoprazole (PROTONIX) 40 MG tablet Take 40 mg by mouth daily.     phenol (CHLORASEPTIC) 1.4 % LIQD Use as directed 1 spray in the mouth or throat as needed for throat irritation / pain.  0   pravastatin (PRAVACHOL) 10 MG tablet Take 10 mg by mouth daily.     sertraline (ZOLOFT) 50 MG tablet Take 75 mg by mouth daily.     tiZANidine (ZANAFLEX) 4 MG tablet Take 1 tablet (4 mg total) by mouth every 8 (eight) hours as needed for muscle spasms. 30 tablet 0   traMADol (ULTRAM) 50  MG tablet Take 1 tablet (50 mg total) by mouth every 6 (six) hours as needed (mild pain). 30 tablet 0   No current facility-administered medications for this visit.    SURGICAL HISTORY:  Past Surgical History:  Procedure Laterality Date   ABDOMINAL HYSTERECTOMY  1996   BRONCHIAL BRUSHINGS  06/29/2021   Procedure: BRONCHIAL BRUSHINGS;  Surgeon: Collene Gobble, MD;  Location: [facility] ENDOSCOPY;  Service: Pulmonary;;   BRONCHIAL NEEDLE ASPIRATION BIOPSY  06/29/2021   Procedure: BRONCHIAL NEEDLE ASPIRATION BIOPSIES;  Surgeon: Collene Gobble, MD;  Location: [facility] ENDOSCOPY;  Service: Pulmonary;;   CATARACT EXTRACTION, BILATERAL     CESAREAN [place], 1984   COLONOSCOPY W/ POLYPECTOMY     EYE SURGERY Bilateral 2014   cataratact and lasik surgery   FIDUCIAL MARKER PLACEMENT  06/29/2021   Procedure: FIDUCIAL DYE MARKING;  Surgeon: Collene Gobble, MD;  Location: [facility] ENDOSCOPY;  Service: Pulmonary;;   INTERCOSTAL NERVE BLOCK  06/29/2021   Procedure: INTERCOSTAL NERVE BLOCK;  Surgeon: Lajuana Matte, MD;  Location: [place];  Service: Thoracic;;   KYPHOPLASTY N/A 06/02/2015   Procedure: KYPHOPLASTY;  Surgeon: Phylliss Bob, MD;  Location: [place];  Service: Orthopedics;  Laterality: N/A;  Thoracic 3, 8, 10 kyphoplasty   LOBECTOMY  06/29/2021   Procedure: RIGHT UPPER LOBECTOMY;  Surgeon: Lajuana Matte, MD;  Location: [place];  Service: Thoracic;;   LYMPH NODE DISSECTION  06/29/2021   Procedure: LYMPH NODE DISSECTION;  Surgeon: Lajuana Matte, MD;  Location: [place];  Service: Thoracic;;    REVIEW OF SYSTEMS:  A comprehensive review of systems was negative except for: Constitutional: positive for fatigue Respiratory: positive for dyspnea on exertion and wheezing Behavioral/Psych: positive for anxiety   PHYSICAL EXAMINATION: General appearance: alert, cooperative, appears stated age, and fatigued Head: Normocephalic, without obvious abnormality, atraumatic Neck: no adenopathy, no  JVD, supple, symmetrical, trachea midline, and thyroid not enlarged, symmetric, no tenderness/mass/nodules Lymph nodes: Cervical, supraclavicular, and axillary nodes normal. Resp: clear to auscultation bilaterally Back: symmetric, no curvature. ROM normal. No CVA tenderness. Cardio: regular rate and rhythm, S1, S2 normal, no murmur, click, rub or gallop GI: soft, non-tender; bowel sounds normal; no masses,  no organomegaly Extremities: extremities normal, atraumatic, no cyanosis or edema  ECOG PERFORMANCE STATUS: 1 - Symptomatic but completely ambulatory  Blood pressure (!) 140/78, pulse 84, temperature 98.3 F (36.8 C), temperature source Oral, resp. rate 16, weight 178 lb 9 oz (81 kg), SpO2 99 %.  LABORATORY DATA: Lab Results  Component Value Date   WBC 8.7 03/20/2022   HGB 12.2 03/20/2022  HCT 38.1 03/20/2022   MCV 84.3 03/20/2022   PLT 296 03/20/2022      Chemistry      Component Value Date/Time   NA 143 03/20/2022 0830   K 4.1 03/20/2022 0830   CL 109 03/20/2022 0830   CO2 27 03/20/2022 0830   BUN 15 03/20/2022 0830   CREATININE 1.05 (H) 03/20/2022 0830   CREATININE 0.83 10/26/2016 1039      Component Value Date/Time   CALCIUM 8.6 (L) 03/20/2022 0830   ALKPHOS 109 03/20/2022 0830   AST 14 (L) 03/20/2022 0830   ALT 11 03/20/2022 0830   BILITOT 0.3 03/20/2022 0830       RADIOGRAPHIC STUDIES: CT Chest W Contrast  Result Date: 03/20/2022 CLINICAL DATA:  Primary Cancer Type: Lung Imaging Indication: Assess response to therapy Interval therapy since last imaging?  Yes, resection Initial Cancer Diagnosis Date: 06/29/2021; Established by: Biopsy-proven Detailed Pathology: Stage Ia non-small cell lung cancer, adenocarcinoma. Primary Tumor location:  Right upper lobe. Surgeries: Right upper lobectomy 06/29/2021. Kyphoplasty T3, T8, T10 06/02/2015. Chemotherapy: No Immunotherapy? No Radiation therapy? No * Tracking Code: BO * EXAM: CT CHEST WITH CONTRAST TECHNIQUE:  Multidetector CT imaging of the chest was performed during intravenous contrast administration. RADIATION DOSE REDUCTION: This exam was performed according to the departmental dose-optimization program which includes automated exposure control, adjustment of the mA and/or kV according to patient size and/or use of iterative reconstruction technique. CONTRAST:  53mL OMNIPAQUE IOHEXOL 300 MG/ML  SOLN COMPARISON:  Most recent CT chest 06/29/2021.  04/27/2021 PET-CT. FINDINGS: Cardiovascular: Atherosclerotic calcification of the aorta and coronary arteries. Heart size normal. No pericardial effusion. Mediastinum/Nodes: Low-attenuation lesions in the thyroid measure up to 1.6 cm on the right. No pathologically enlarged mediastinal, hilar or axillary lymph nodes. Esophagus is grossly unremarkable. Lungs/Pleura: Right upper lobectomy. New subpleural right middle lobe nodule measures 6 mm (7/39). 4 mm anterior right lower lobe nodule (7/59), stable. Anterolateral subpleural right lower lobe nodule may have increased slightly in size, 6 mm (7/108), previously 5 mm. 2 mm apical left upper lobe nodule (7/30), unchanged. No pleural fluid. Airway is otherwise unremarkable. Upper Abdomen: Liver is slightly decreased in attenuation diffusely. Blush of hyperattenuation in the dome of the liver measures approximately 11 mm, indeterminate but possibly a flash fill hemangioma or perfusion anomaly. Visualized portions of the liver, gallbladder and adrenal glands are unremarkable. Subcentimeter low-attenuation lesions in the kidneys, too small to characterize. No specific follow-up necessary. Scarring in the left kidney. Visualized portions of the spleen, pancreas, stomach and bowel are grossly unremarkable. Musculoskeletal: Degenerative changes in the spine. Thoracic vertebroplasties. No worrisome lytic or sclerotic lesions. IMPRESSION: 1. New 6 mm right middle lobe nodule. Possible slight enlargement of a right lower lobe nodule.  Findings raise concern for metastatic disease. 2. Hepatic steatosis. 3. Thyroid nodules measure up to 1.6 cm. Recommend thyroid ultrasound. (Ref: J Am Coll Radiol. 2015 Feb;12(2): 143-50). 4. Aortic atherosclerosis (ICD10-I70.0). Coronary artery calcification. Electronically Signed   By: Lorin Picket M.D.   On: 03/20/2022 11:51    ASSESSMENT AND PLAN: This is a very pleasant 71 years old white female with stage IA (T1 a, N0, M0) non-small cell lung cancer, adenocarcinoma status post right upper lobectomy with lymph node dissection under the care of Dr. Kipp Brood on June 29, 2021. The patient is currently on observation and she is feeling fine with no concerning complaints. She had repeat CT scan of the chest performed recently.  I personally and independently reviewed the  scan images and discussed the result with the patient and her daughter. Her scan showed new 0.6 cm right middle lobe lung nodule with slight enlargement of a right lower lobe nodule and these are concerning for possible metastatic disease. I recommended for the patient to have repeat CT scan of the chest in around 3 months for further evaluation of this lesions and to rule out any further progression.  These nodules are currently under the detection limit of the PET scan and may be hard to biopsy with the current size. For the wheezing and shortness of breath I gave the patient refill of albuterol for 1 time until she establish care with a primary care physician by the end of the months. The patient was advised to call immediately if she has any concerning symptoms in the interval. The patient voices understanding of current disease status and treatment options and is in agreement with the current care plan.  All questions were answered. The patient knows to call the clinic with any problems, questions or concerns. We can certainly see the patient much sooner if necessary. The total time spent in the appointment was 20  minutes.  Disclaimer: This note was dictated with voice recognition software. Similar sounding words can inadvertently be transcribed and may not be corrected upon review.

## 2022-03-23 ENCOUNTER — Telehealth: Payer: Self-pay | Admitting: Internal Medicine

## 2022-03-23 NOTE — Telephone Encounter (Signed)
Scheduled per 32/10 los, pt has been called and confirmed

## 2022-03-29 ENCOUNTER — Encounter: Payer: Self-pay | Admitting: Orthopedic Surgery

## 2022-03-29 ENCOUNTER — Ambulatory Visit (INDEPENDENT_AMBULATORY_CARE_PROVIDER_SITE_OTHER): Payer: Medicare Other | Admitting: Orthopedic Surgery

## 2022-03-29 VITALS — BP 128/82 | HR 78 | Temp 96.8°F | Ht 62.0 in | Wt 177.6 lb

## 2022-03-29 DIAGNOSIS — R600 Localized edema: Secondary | ICD-10-CM

## 2022-03-29 DIAGNOSIS — E1165 Type 2 diabetes mellitus with hyperglycemia: Secondary | ICD-10-CM | POA: Diagnosis not present

## 2022-03-29 DIAGNOSIS — K219 Gastro-esophageal reflux disease without esophagitis: Secondary | ICD-10-CM

## 2022-03-29 DIAGNOSIS — F32A Depression, unspecified: Secondary | ICD-10-CM | POA: Diagnosis not present

## 2022-03-29 DIAGNOSIS — E7800 Pure hypercholesterolemia, unspecified: Secondary | ICD-10-CM

## 2022-03-29 DIAGNOSIS — M62838 Other muscle spasm: Secondary | ICD-10-CM | POA: Diagnosis not present

## 2022-03-29 DIAGNOSIS — R413 Other amnesia: Secondary | ICD-10-CM | POA: Diagnosis not present

## 2022-03-29 DIAGNOSIS — E2839 Other primary ovarian failure: Secondary | ICD-10-CM

## 2022-03-29 DIAGNOSIS — E042 Nontoxic multinodular goiter: Secondary | ICD-10-CM

## 2022-03-29 DIAGNOSIS — F419 Anxiety disorder, unspecified: Secondary | ICD-10-CM | POA: Diagnosis not present

## 2022-03-29 DIAGNOSIS — C3411 Malignant neoplasm of upper lobe, right bronchus or lung: Secondary | ICD-10-CM | POA: Diagnosis not present

## 2022-03-29 DIAGNOSIS — R0609 Other forms of dyspnea: Secondary | ICD-10-CM | POA: Diagnosis not present

## 2022-03-29 MED ORDER — BUPROPION HCL ER (XL) 150 MG PO TB24: 150.0000 mg | ORAL_TABLET | Freq: Every day | ORAL | 1 refills | Status: DC

## 2022-03-29 MED ORDER — TRAMADOL HCL 50 MG PO TABS: 50.0000 mg | ORAL_TABLET | Freq: Four times a day (QID) | ORAL | 0 refills | Status: DC | PRN

## 2022-03-29 MED ORDER — PANTOPRAZOLE SODIUM 40 MG PO TBEC: 40.0000 mg | DELAYED_RELEASE_TABLET | Freq: Every day | ORAL | 1 refills | Status: DC

## 2022-03-29 MED ORDER — LEVOTHYROXINE SODIUM 25 MCG PO TABS: 25.0000 ug | ORAL_TABLET | Freq: Every day | ORAL | 0 refills | Status: DC

## 2022-03-29 MED ORDER — TIZANIDINE HCL 4 MG PO TABS: 4.0000 mg | ORAL_TABLET | Freq: Three times a day (TID) | ORAL | 0 refills | Status: DC | PRN

## 2022-03-29 MED ORDER — FUROSEMIDE 20 MG PO TABS: 20.0000 mg | ORAL_TABLET | Freq: Every day | ORAL | 1 refills | Status: DC

## 2022-03-29 MED ORDER — ALBUTEROL SULFATE (2.5 MG/3ML) 0.083% IN NEBU: 2.5000 mg | INHALATION_SOLUTION | RESPIRATORY_TRACT | 10 refills | Status: AC | PRN

## 2022-03-29 MED ORDER — SERTRALINE HCL 100 MG PO TABS: 100.0000 mg | ORAL_TABLET | Freq: Every day | ORAL | 1 refills | Status: DC

## 2022-03-29 MED ORDER — PRAVASTATIN SODIUM 10 MG PO TABS: 10.0000 mg | ORAL_TABLET | Freq: Every day | ORAL | 1 refills | Status: DC

## 2022-03-29 NOTE — Progress Notes (Signed)
Careteam: Patient Care Team: Pcp, No as PCP - General Valrie Hart, RN as Oncology Nurse Navigator (Oncology) Phylliss Bob, MD as Consulting Physician (Orthopedic Surgery)  Seen by: Windell Moulding, AGNP-C  PLACE OF SERVICE:  [place] Directive information    Allergies  Allergen Reactions   Ethylene Oxide     Other reaction(s): scarred cornea   Other     Etholine Oxide: causes itching and hives, and temporary blindness    No chief complaint on file.    HPI: Patient is a 71 y.o. female seen today to establish at [facility].   PCP was Dr. Justin Mend. Originally from [place], moved to [place] about 40 years ago. Retired in her 65's. Previous blood Sales executive. Graduated from [employer]. Widowed 20 years. 2 children- 2 girls, live close.   Non- small cell lung cancer, adenocarcinoma- s/p right upper lobectomy with node dissection 06/2021, CT chest 03/2022 revealed 0.6 cm right middle lobe lung nodule, repeat CT chest in 3 months, remains on albuterol for sob- only sob if she talks a lot or with exertion, exposure to second hand smoke from father,intermittent smoker in her life, admits to right upper chest pain after lobectomy  Muscle spasms to chest- see above, remain on tizanidine and tramadol prn  HTN- remains on furosemide  Bilateral leg swelling- remains on furosemide daily  HLD- on pravastatin  T2DM- not on medication, requesting A1c be rechecked  Hyperthyroidism/Multinodular goiter- s/p radioactive iodine 2015, on levothyroxine  Osteopenia- last DEXA 2017, not on any supplements  Depression/ PTSD/ Anxiety- remains on Wellbutrin, Zoloft, and xanax, started antidepressant due to passing of father and husband, reports having crying spells at times, no recent panic attackd  Memory impairment- MMSE 30/30 today, correct clock and sentence, she was given Adderrall to help  No past h/o MI or stroke.   Surgical history:  Right upper  lobectomy with node dissection 06/2021  Abdominal hysterectomy- 1996  C-section  Cataract extraction   Family history:  Father deceased at age 22- front lobe dementia  Mother - COPD and depression  Sister age 63- non hodgekins lymphoma  Lennette Bihari age 5- does not know health history  Carla age 82- bipolar, HTN, breast cancer, obesity  Aaron Edelman age 29- has pacemaker  Smoking: quit smoking 2017, intermittent use throughout life Alcohol: does not drink alcohol Illicit drug: no past drug use or abuse  Diet: eats one meal daily, does not eat fast foods or restaurant food often, drinks one soda daily, eats about 2-4 servings of vegetables/fruits daily Diet: no regular exercise regimen, lives on [place] acres and does own house work  Dental- last cleaning 3 weeks ago Eye- followed by Dr. Coralyn Pear for macular degeneration, on AREDS  Has HPOA, living will, copy requested   Review of Systems:  Review of Systems  Constitutional:  Negative for chills, fever, malaise/fatigue and weight loss.  HENT:  Negative for congestion and sore throat.   Eyes:  Negative for blurred vision and double vision.  Respiratory:  Positive for shortness of breath and wheezing. Negative for cough and sputum production.   Cardiovascular:  Negative for chest pain and leg swelling.  Gastrointestinal:  Positive for heartburn. Negative for abdominal pain, blood in stool, constipation, diarrhea, nausea and vomiting.  Genitourinary:  Negative for dysuria, frequency and hematuria.  Musculoskeletal:  Positive for myalgias. Negative for falls.  Skin:  Negative for rash.  Neurological:  Negative for dizziness, tingling and weakness.  Endo/Heme/Allergies:  Negative for environmental allergies and polydipsia.  Psychiatric/Behavioral:  Positive for depression and memory loss. Negative for substance abuse and suicidal ideas. The patient is nervous/anxious. The patient does not have insomnia.     Past Medical History:  Diagnosis Date    ADHD (attention deficit hyperactivity disorder) 2020   ADD   Anxiety    Arthritis    Broken heart syndrome 2013   Carpal tunnel syndrome    Cataract    Chronic kidney disease    Depression    Diabetes mellitus without complication ([place])    Type 2   Dysrhythmia 1976   tachycardia   Fall 04/10/2015   GERD (gastroesophageal reflux disease)    Goiter    History of shingles    On abdomen, left ring finger, and left leg; Pt takes Valtrex   Hyperlipidemia    Hypertension    Hypothyroidism    lung ca 04/2021   Obesity    PTSD (post-traumatic stress disorder)    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   Shortness of breath dyspnea    pt stated its related to the back problems she has   Thyroid disease    Ulcer    Past Surgical History:  Procedure Laterality Date   ABDOMINAL HYSTERECTOMY  1996   BRONCHIAL BRUSHINGS  06/29/2021   Procedure: BRONCHIAL BRUSHINGS;  Surgeon: Collene Gobble, MD;  Location: [facility] ENDOSCOPY;  Service: Pulmonary;;   BRONCHIAL NEEDLE ASPIRATION BIOPSY  06/29/2021   Procedure: BRONCHIAL NEEDLE ASPIRATION BIOPSIES;  Surgeon: Collene Gobble, MD;  Location: [place] ENDOSCOPY;  Service: Pulmonary;;   CATARACT EXTRACTION, BILATERAL     CESAREAN [place], 1984   COLONOSCOPY W/ POLYPECTOMY     EYE SURGERY Bilateral 2014   cataratact and lasik surgery   FIDUCIAL MARKER PLACEMENT  06/29/2021   Procedure: FIDUCIAL DYE MARKING;  Surgeon: Collene Gobble, MD;  Location: [facility] ENDOSCOPY;  Service: Pulmonary;;   INTERCOSTAL NERVE BLOCK  06/29/2021   Procedure: INTERCOSTAL NERVE BLOCK;  Surgeon: Lajuana Matte, MD;  Location: [facility];  Service: Thoracic;;   KYPHOPLASTY N/A 06/02/2015   Procedure: KYPHOPLASTY;  Surgeon: Phylliss Bob, MD;  Location: [facility];  Service: Orthopedics;  Laterality: N/A;  Thoracic 3, 8, 10 kyphoplasty   LOBECTOMY  06/29/2021   Procedure: RIGHT UPPER LOBECTOMY;  Surgeon: Lajuana Matte, MD;  Location: [facility];  Service: Thoracic;;   LYMPH  NODE DISSECTION  06/29/2021   Procedure: LYMPH NODE DISSECTION;  Surgeon: Lajuana Matte, MD;  Location: [facility];  Service: Thoracic;;   Social History:   reports that she quit smoking about 5 years ago. Her smoking use included cigarettes. She has a 2.00 pack-year smoking history. She has never used smokeless tobacco. She reports current alcohol use. She reports that she does not use drugs.  Family History  Problem Relation Age of Onset   COPD Mother    Depression Mother    Hypertension Mother    Hyperlipidemia Mother    Breast cancer Mother 96   Alzheimer's disease Father    Heart disease Father    Breast cancer Sister 27   Colon cancer Neg Hx     Medications: Patient's Medications  New Prescriptions   No medications on file  Previous Medications   ALBUTEROL (PROVENTIL) (2.5 MG/3ML) 0.083% NEBULIZER SOLUTION    Take 2.5 mg by nebulization every 4 (four) hours as needed for wheezing or shortness of breath.    ALBUTEROL (VENTOLIN HFA) 108 (  90 BASE) MCG/ACT INHALER    Inhale 1-2 puffs into the lungs every 6 (six) hours as needed for wheezing or shortness of breath. This is a one-time refill from me until the patient establish care with her primary care physician.  Please do not send request for refill to me in the future.   ALPRAZOLAM (XANAX) 0.5 MG TABLET    Take 1 tablet (0.5 mg total) by mouth 3 (three) times daily as needed for anxiety.   AMPHETAMINE-DEXTROAMPHETAMINE (ADDERALL) 10 MG TABLET    Take 1 tablet (10 mg total) by mouth every morning.   BUPROPION (WELLBUTRIN XL) 150 MG 24 HR TABLET    Take 150 mg by mouth daily.   CYCLOSPORINE (RESTASIS) 0.05 % OPHTHALMIC EMULSION    Place 1 drop into both eyes 2 (two) times daily.   DIPHENHYDRAMINE-ACETAMINOPHEN (TYLENOL PM) 25-500 MG TABS TABLET    Take 2 tablets by mouth at bedtime as needed.   FUROSEMIDE (LASIX) 20 MG TABLET    Take 20 mg by mouth daily. May take an additional dose if needed for swelling   GUAIFENESIN (MUCINEX)  600 MG 12 HR TABLET    Take 1 tablet (600 mg total) by mouth 2 (two) times daily.   IBUPROFEN (ADVIL) 200 MG TABLET    Take 200 mg by mouth as needed.   LEVOTHYROXINE (SYNTHROID, LEVOTHROID) 25 MCG TABLET    Take 25 mcg by mouth daily before breakfast.   LIDOCAINE (LIDODERM) 5 %    Place 1 patch onto the skin daily as needed (irritation). Remove & Discard patch within 12 hours or as directed by MD   MULTIPLE VITAMINS-MINERALS (PRESERVISION AREDS 2 PO)    Take 1 capsule by mouth in the morning and at bedtime.   PANTOPRAZOLE (PROTONIX) 40 MG TABLET    Take 40 mg by mouth daily.   PHENOL (CHLORASEPTIC) 1.4 % LIQD    Use as directed 1 spray in the mouth or throat as needed for throat irritation / pain.   PRAVASTATIN (PRAVACHOL) 10 MG TABLET    Take 10 mg by mouth daily.   SERTRALINE (ZOLOFT) 50 MG TABLET    Take 75 mg by mouth daily.   TIZANIDINE (ZANAFLEX) 4 MG TABLET    Take 1 tablet (4 mg total) by mouth every 8 (eight) hours as needed for muscle spasms.   TRAMADOL (ULTRAM) 50 MG TABLET    Take 1 tablet (50 mg total) by mouth every 6 (six) hours as needed (mild pain).  Modified Medications   No medications on file  Discontinued Medications   No medications on file    Physical Exam:  There were no vitals filed for this visit. There is no height or weight on file to calculate BMI. Wt Readings from Last 3 Encounters:  03/22/22 178 lb 9 oz (81 kg)  09/19/21 171 lb 6.4 oz (77.7 kg)  08/18/21 165 lb (74.8 kg)    Physical Exam Vitals reviewed.  Constitutional:      General: She is not in acute distress. HENT:     Head: Normocephalic.  Eyes:     General:        Right eye: No discharge.        Left eye: No discharge.  Neck:     Vascular: No carotid bruit.  Cardiovascular:     Rate and Rhythm: Normal rate and regular rhythm.     Pulses: Normal pulses.     Heart sounds: Normal heart sounds.  Pulmonary:  Effort: Pulmonary effort is normal. No respiratory distress.     Breath  sounds: Normal breath sounds. No wheezing or rales.  Abdominal:     General: Bowel sounds are normal. There is no distension.     Palpations: Abdomen is soft.     Tenderness: There is no abdominal tenderness.  Musculoskeletal:     Cervical back: Neck supple.     Right lower leg: Edema present.     Left lower leg: Edema present.     Comments: Non pitting  Lymphadenopathy:     Cervical: No cervical adenopathy.  Skin:    General: Skin is warm and dry.     Capillary Refill: Capillary refill takes less than 2 seconds.  Neurological:     General: No focal deficit present.     Mental Status: She is alert and oriented to person, place, and time.  Psychiatric:        Mood and Affect: Mood normal.        Behavior: Behavior normal.     Labs reviewed: Basic Metabolic Panel: Recent Labs    07/01/21 0059 09/19/21 1035 03/20/22 0830  NA 138 142 143  K 4.0 3.7 4.1  CL 105 107 109  CO2 29 29 27   GLUCOSE 142* 117* 133*  BUN 11 10 15   CREATININE 1.09* 1.04* 1.05*  CALCIUM 8.3* 8.9 8.6*   Liver Function Tests: Recent Labs    07/01/21 0059 09/19/21 1035 03/20/22 0830  AST 23 15 14*  ALT 12 9 11   ALKPHOS 68 112 109  BILITOT 0.4 0.4 0.3  PROT 5.5* 6.9 7.2  ALBUMIN 3.0* 4.0 4.2   No results for input(s): "LIPASE", "AMYLASE" in the last 8760 hours. No results for input(s): "AMMONIA" in the last 8760 hours. CBC: Recent Labs    07/01/21 0059 09/19/21 1035 03/20/22 0830  WBC 10.2 7.5 8.7  NEUTROABS  --  4.6 5.9  HGB 10.2* 12.0 12.2  HCT 32.7* 37.4 38.1  MCV 89.3 85.0 84.3  PLT 222 270 296   Lipid Panel: No results for input(s): "CHOL", "HDL", "LDLCALC", "TRIG", "CHOLHDL", "LDLDIRECT" in the last 8760 hours. TSH: No results for input(s): "TSH" in the last 8760 hours. A1C: Lab Results  Component Value Date   HGBA1C 6.5 (H) 10/02/2018     Assessment/Plan:  1. Primary adenocarcinoma of upper lobe of right lung [facility]) - followed by Dr. Earlie Server - s/p right upper  lobectomy with node dissection 06/2021,  - CT chest 03/2022 revealed 0.6 cm right middle lobe lung nodule - repeat CT chest in 3 months - see above  2. Dyspnea on exertion - see below - albuterol (PROVENTIL) (2.5 MG/3ML) 0.083% nebulizer solution; Take 3 mLs (2.5 mg total) by nebulization every 4 (four) hours as needed for wheezing or shortness of breath.  Dispense: 75 mL; Refill: 10  3. Type 2 diabetes mellitus with hyperglycemia, without long term use of insulin - A1c 6.5 6 years ago - glucose 133 03/20/2022 - off medications - A1c- future  4. Anxiety and depression - ongoing - sometimes tearful, no panic attacks - plan to wean off xanax - cont Wellbutrin and Zoloft - buPROPion (WELLBUTRIN XL) 150 MG 24 hr tablet; Take 1 tablet (150 mg total) by mouth daily.  Dispense: 90 tablet; Refill: 1 - sertraline (ZOLOFT) 100 MG tablet; Take 1 tablet (100 mg total) by mouth daily.  Dispense: 90 tablet; Refill: 1  5. Bilateral leg edema - non pitting - BUN/creat 15/1.05 03/20/2022 - furosemide (  LASIX) 20 MG tablet; Take 1 tablet (20 mg total) by mouth daily. May take an additional dose if needed for swelling  Dispense: 90 tablet; Refill: 1  6. Gastroesophageal reflux disease without esophagitis - hgb 12.2 03/20/2022 - pantoprazole (PROTONIX) 40 MG tablet; Take 1 tablet (40 mg total) by mouth daily.  Dispense: 90 tablet; Refill: 1  7. Pure hypercholesterolemia - LDL unknown - pravastatin (PRAVACHOL) 10 MG tablet; Take 1 tablet (10 mg total) by mouth daily.  Dispense: 90 tablet; Refill: 1  8. Night muscle spasms - began after right upper lobectomy 06/2021 - tiZANidine (ZANAFLEX) 4 MG tablet; Take 1 tablet (4 mg total) by mouth every 8 (eight) hours as needed for muscle spasms.  Dispense: 30 tablet; Refill: 0 - traMADol (ULTRAM) 50 MG tablet; Take 1 tablet (50 mg total) by mouth every 6 (six) hours as needed (mild pain).  Dispense: 30 tablet; Refill: 0  9. Estrogen deficiency - DG Bone  Density; Future  10. Multinodular goiter - s/p radioactive iodine 2015 - TSH 2.978 2019 - cont levothyroxine - levothyroxine (SYNTHROID) 25 MCG tablet; Take 1 tablet (25 mcg total) by mouth daily before breakfast.  Dispense: 60 tablet; Refill: 0 - TSH-future  11. Memory impairment - MMSE 30/30, correct clock and sentence - was on adderral unsure effectiveness - advised trial without medication  Future labs/tests: TSH, lipid panel, A1c, PHQ  Total time: 55 minutes. Greater than 50% of total time spent doing patient education regarding health maintenance, lung cancer, diabetes, depression/anxiety, symptom/medication management  Next appt: 05/03/2022  Niel Hummer  [facility] [phone]

## 2022-03-29 NOTE — Patient Instructions (Addendum)
May try voltaren gel or yellow mustard for right upper chest pain  Please bring copy of HPOA/ living will

## 2022-04-23 ENCOUNTER — Encounter: Payer: Self-pay | Admitting: Family Medicine

## 2022-04-23 ENCOUNTER — Ambulatory Visit (INDEPENDENT_AMBULATORY_CARE_PROVIDER_SITE_OTHER): Payer: Medicare Other | Admitting: Family Medicine

## 2022-04-23 VITALS — BP 114/75 | HR 83 | Temp 97.7°F | Resp 16 | Ht 61.5 in | Wt 178.2 lb

## 2022-04-23 DIAGNOSIS — R7303 Prediabetes: Secondary | ICD-10-CM

## 2022-04-23 DIAGNOSIS — Z8659 Personal history of other mental and behavioral disorders: Secondary | ICD-10-CM

## 2022-04-23 DIAGNOSIS — Z7689 Persons encountering health services in other specified circumstances: Secondary | ICD-10-CM

## 2022-04-23 DIAGNOSIS — C801 Malignant (primary) neoplasm, unspecified: Secondary | ICD-10-CM

## 2022-04-23 NOTE — Progress Notes (Signed)
Patient is here to established care with pcp. Patient said she is no longer a diabetic.Patient said that they found another spot on her lungs. Patient was concern about that.

## 2022-04-23 NOTE — Progress Notes (Signed)
New Patient Office Visit  Subjective    Patient ID: Jenny Cardenas, female    DOB: July 11, 1951  Age: 71 y.o. MRN: [id number]  CC:  Chief Complaint  Patient presents with   Establish Care    HPI Jenny Cardenas presents to establish care and for review of chronic med issues including adenocarcinoma and prediabetes. Patient denies acute complaints.     Outpatient Encounter Medications as of 04/23/2022  Medication Sig   albuterol (PROVENTIL) (2.5 MG/3ML) 0.083% nebulizer solution Take 3 mLs (2.5 mg total) by nebulization every 4 (four) hours as needed for wheezing or shortness of breath.   albuterol (VENTOLIN HFA) 108 (90 Base) MCG/ACT inhaler Inhale 1-2 puffs into the lungs every 6 (six) hours as needed for wheezing or shortness of breath. This is a one-time refill from me until the patient establish care with her primary care physician.  Please do not send request for refill to me in the future.   ALPRAZolam (XANAX) 0.5 MG tablet Take 1 tablet (0.5 mg total) by mouth 3 (three) times daily as needed for anxiety.   buPROPion (WELLBUTRIN XL) 150 MG 24 hr tablet Take 1 tablet (150 mg total) by mouth daily.   cycloSPORINE (RESTASIS) 0.05 % ophthalmic emulsion Place 1 drop into both eyes 2 (two) times daily.   diphenhydramine-acetaminophen (TYLENOL PM) 25-500 MG TABS tablet Take 2 tablets by mouth at bedtime as needed.   furosemide (LASIX) 20 MG tablet Take 1 tablet (20 mg total) by mouth daily. May take an additional dose if needed for swelling   guaiFENesin (MUCINEX) 600 MG 12 hr tablet Take 1 tablet (600 mg total) by mouth 2 (two) times daily.   ibuprofen (ADVIL) 200 MG tablet Take 200 mg by mouth as needed.   levocetirizine (XYZAL) 5 MG tablet Take 5 mg by mouth at bedtime.   levothyroxine (SYNTHROID) 25 MCG tablet Take 1 tablet (25 mcg total) by mouth daily before breakfast.   lidocaine (LIDODERM) 5 % Place 1 patch onto the skin daily as needed (irritation). Remove & Discard patch within 12  hours or as directed by MD   Multiple Vitamins-Minerals (PRESERVISION AREDS 2 PO) Take 1 capsule by mouth in the morning and at bedtime.   pantoprazole (PROTONIX) 40 MG tablet Take 1 tablet (40 mg total) by mouth daily.   phenol (CHLORASEPTIC) 1.4 % LIQD Use as directed 1 spray in the mouth or throat as needed for throat irritation / pain.   pravastatin (PRAVACHOL) 10 MG tablet Take 1 tablet (10 mg total) by mouth daily.   PRESCRIPTION MEDICATION Natrol memory complex daily   Probiotic Product (PROBIOTIC DAILY PO) Take by mouth daily.   sertraline (ZOLOFT) 100 MG tablet Take 1 tablet (100 mg total) by mouth daily.   tiZANidine (ZANAFLEX) 4 MG tablet Take 1 tablet (4 mg total) by mouth every 8 (eight) hours as needed for muscle spasms.   traMADol (ULTRAM) 50 MG tablet Take 1 tablet (50 mg total) by mouth every 6 (six) hours as needed (mild pain).   [DISCONTINUED] amphetamine-dextroamphetamine (ADDERALL) 10 MG tablet Take 1 tablet (10 mg total) by mouth every morning. (Patient not taking: Reported on 04/23/2022)   No facility-administered encounter medications on file as of 04/23/2022.    Past Medical History:  Diagnosis Date   ADHD (attention deficit hyperactivity disorder) 2020   ADD   Anxiety    Arthritis    Broken heart syndrome 2013   Carpal tunnel syndrome    Cataract  Chronic kidney disease    Depression    Diabetes mellitus without complication ([place])    Type 2   Dysrhythmia 1976   tachycardia   Fall 04/10/2015   GERD (gastroesophageal reflux disease)    Goiter    History of shingles    On abdomen, left ring finger, and left leg; Pt takes Valtrex   Hyperlipidemia    Hypertension    Hypothyroidism    lung ca 04/2021   Obesity    PTSD (post-traumatic stress disorder)    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   Shortness of breath dyspnea    pt stated its related to the back problems she has   Thyroid disease    Ulcer     Past Surgical History:  Procedure Laterality  Date   ABDOMINAL HYSTERECTOMY  1996   BRONCHIAL BRUSHINGS  06/29/2021   Procedure: BRONCHIAL BRUSHINGS;  Surgeon: Collene Gobble, MD;  Location: [facility] ENDOSCOPY;  Service: Pulmonary;;   BRONCHIAL NEEDLE ASPIRATION BIOPSY  06/29/2021   Procedure: BRONCHIAL NEEDLE ASPIRATION BIOPSIES;  Surgeon: Collene Gobble, MD;  Location: [place] ENDOSCOPY;  Service: Pulmonary;;   CATARACT EXTRACTION, BILATERAL     CESAREAN [place], 1984   COLONOSCOPY W/ POLYPECTOMY     EYE SURGERY Bilateral 2014   cataratact and lasik surgery   FIDUCIAL MARKER PLACEMENT  06/29/2021   Procedure: FIDUCIAL DYE MARKING;  Surgeon: Collene Gobble, MD;  Location: [place] ENDOSCOPY;  Service: Pulmonary;;   INTERCOSTAL NERVE BLOCK  06/29/2021   Procedure: INTERCOSTAL NERVE BLOCK;  Surgeon: Lajuana Matte, MD;  Location: [place];  Service: Thoracic;;   KYPHOPLASTY N/A 06/02/2015   Procedure: KYPHOPLASTY;  Surgeon: Phylliss Bob, MD;  Location: [place];  Service: Orthopedics;  Laterality: N/A;  Thoracic 3, 8, 10 kyphoplasty   LOBECTOMY  06/29/2021   Procedure: RIGHT UPPER LOBECTOMY;  Surgeon: Lajuana Matte, MD;  Location: [place] OR;  Service: Thoracic;;   LYMPH NODE DISSECTION  06/29/2021   Procedure: LYMPH NODE DISSECTION;  Surgeon: Lajuana Matte, MD;  Location: [facility] OR;  Service: Thoracic;;    Family History  Problem Relation Age of Onset   COPD Mother    Depression Mother    Hypertension Mother    Hyperlipidemia Mother    Breast cancer Mother 19   Diverticulosis Mother    Heart disease Father    Dementia Father    Breast cancer Sister 71   Non-Hodgkin's lymphoma Sister    Colon cancer Neg Hx     Social History   Socioeconomic History   Marital status: Widowed    Spouse name: Not on file   Number of children: 2   Years of education: Not on file   Highest education level: Not on file  Occupational History   Not on file  Tobacco Use   Smoking status: Former    Packs/day: 0.50    Years: 4.00     Total pack years: 2.00    Types: Cigarettes    Quit date: 06/28/2016    Years since quitting: 5.8   Smokeless tobacco: Never  Vaping Use   Vaping Use: Never used  Substance and Sexual Activity   Alcohol use: Not Currently    Comment: rare glass of wine (holidays only)   Drug use: Never   Sexual activity: Not Currently  Other Topics Concern   Not on file  Social History Narrative   Not on file   Social Determinants of Health  Financial Resource Strain: Not on file  Food Insecurity: Not on file  Transportation Needs: Not on file  Physical Activity: Not on file  Stress: Not on file  Social Connections: Not on file  Intimate Partner Violence: Not on file    Review of Systems  All other systems reviewed and are negative.       Objective    BP 114/75   Pulse 83   Temp 97.7 F (36.5 C) (Oral)   Resp 16   Ht 5' 1.5" (1.562 m)   Wt 178 lb 3.2 oz (80.8 kg)   SpO2 96%   BMI 33.13 kg/m   Physical Exam Vitals and nursing note reviewed.  Constitutional:      General: She is not in acute distress. Cardiovascular:     Rate and Rhythm: Normal rate and regular rhythm.  Pulmonary:     Effort: Pulmonary effort is normal.     Breath sounds: Normal breath sounds.  Abdominal:     Palpations: Abdomen is soft.     Tenderness: There is no abdominal tenderness.  Neurological:     General: No focal deficit present.     Mental Status: She is alert and oriented to person, place, and time.  Psychiatric:        Mood and Affect: Affect normal. Mood is anxious.        Speech: Speech normal.        Behavior: Behavior normal.         Assessment & Plan:   1. Prediabetes Patient recent A1c at 6. Will monitor.   2. History of posttraumatic stress disorder (PTSD) Patient reports that she takes xanax on a regular basis (#90/mo). She has been getting that from another provider. I discussed in detail with patient that we do not take on prescribing those meds and definitely not in  those amounts. Patient v.u. and stated that she would consider if she wanted to continue to obtain these meds from the other provider or if she would need to establish with another PCP.   3. Adenocarcinoma ([place]) Management per consultant  4. Encounter to establish care     Return in about 3 months (around 07/23/2022) for follow up.   Becky Sax, MD

## 2022-05-03 ENCOUNTER — Ambulatory Visit: Payer: Medicare Other | Admitting: Orthopedic Surgery

## 2022-05-15 DIAGNOSIS — F4312 Post-traumatic stress disorder, chronic: Secondary | ICD-10-CM | POA: Diagnosis not present

## 2022-05-15 DIAGNOSIS — F331 Major depressive disorder, recurrent, moderate: Secondary | ICD-10-CM | POA: Diagnosis not present

## 2022-05-16 DIAGNOSIS — I83893 Varicose veins of bilateral lower extremities with other complications: Secondary | ICD-10-CM | POA: Diagnosis not present

## 2022-05-18 ENCOUNTER — Ambulatory Visit (INDEPENDENT_AMBULATORY_CARE_PROVIDER_SITE_OTHER): Payer: Medicare Other

## 2022-05-18 DIAGNOSIS — Z0000 Encounter for general adult medical examination without abnormal findings: Secondary | ICD-10-CM

## 2022-05-18 NOTE — Patient Instructions (Signed)

## 2022-05-18 NOTE — Progress Notes (Signed)
Subjective:   Jenny Cardenas is a 71 y.o. female who presents for an Initial Medicare Annual Wellness Visit. I connected with  Hubert Azure on 05/18/22 by a audio enabled telemedicine application and verified that I am speaking with the correct person using two identifiers.  Patient Location: Home  Provider Location: Home Office  I discussed the limitations of evaluation and management by telemedicine. The patient expressed understanding and agreed to proceed.     Objective:    Today's Vitals   There is no height or weight on file to calculate BMI.     03/29/2022    1:28 PM 09/19/2021   11:59 AM 07/02/2021    6:00 AM 06/27/2021    1:33 PM 07/13/2019    4:13 AM 07/12/2019    7:21 PM 10/01/2018   10:43 AM  Advanced Directives  Does Patient Have a Medical Advance Directive? Yes Yes Yes Yes Yes No Yes  Type of Paramedic of [place];Living will Living will;Healthcare Power of [place];Living will [place];Living will Living will;Healthcare Power of [place];Living will  Does patient want to make changes to medical advance directive? No - Patient declined  No - Patient declined No - Patient declined No - Patient declined  No - Patient declined  Copy of [place] in Chart?    No - copy requested No - copy requested  No - copy requested  Would patient like information on creating a medical advance directive?     No - Patient declined No - Patient declined     Current Medications (verified) Outpatient Encounter Medications as of 05/18/2022  Medication Sig   albuterol (PROVENTIL) (2.5 MG/3ML) 0.083% nebulizer solution Take 3 mLs (2.5 mg total) by nebulization every 4 (four) hours as needed for wheezing or shortness of breath.   albuterol (VENTOLIN HFA) 108 (90 Base) MCG/ACT inhaler Inhale 1-2 puffs into the lungs every 6 (six) hours as needed for wheezing or shortness  of breath. This is a one-time refill from me until the patient establish care with her primary care physician.  Please do not send request for refill to me in the future.   buPROPion (WELLBUTRIN XL) 150 MG 24 hr tablet Take 1 tablet (150 mg total) by mouth daily.   diphenhydramine-acetaminophen (TYLENOL PM) 25-500 MG TABS tablet Take 2 tablets by mouth at bedtime as needed.   furosemide (LASIX) 20 MG tablet Take 1 tablet (20 mg total) by mouth daily. May take an additional dose if needed for swelling   guaiFENesin (MUCINEX) 600 MG 12 hr tablet Take 1 tablet (600 mg total) by mouth 2 (two) times daily.   ibuprofen (ADVIL) 200 MG tablet Take 200 mg by mouth as needed.   levocetirizine (XYZAL) 5 MG tablet Take 5 mg by mouth at bedtime.   levothyroxine (SYNTHROID) 25 MCG tablet Take 1 tablet (25 mcg total) by mouth daily before breakfast.   lidocaine (LIDODERM) 5 % Place 1 patch onto the skin daily as needed (irritation). Remove & Discard patch within 12 hours or as directed by MD   pantoprazole (PROTONIX) 40 MG tablet Take 1 tablet (40 mg total) by mouth daily.   PRESCRIPTION MEDICATION Natrol memory complex daily   Probiotic Product (PROBIOTIC DAILY PO) Take by mouth daily.   sertraline (ZOLOFT) 100 MG tablet Take 1 tablet (100 mg total) by mouth daily.   tiZANidine (ZANAFLEX) 4 MG tablet Take 1  tablet (4 mg total) by mouth every 8 (eight) hours as needed for muscle spasms.   traMADol (ULTRAM) 50 MG tablet Take 1 tablet (50 mg total) by mouth every 6 (six) hours as needed (mild pain).   ALPRAZolam (XANAX) 0.5 MG tablet Take 1 tablet (0.5 mg total) by mouth 3 (three) times daily as needed for anxiety. (Patient not taking: Reported on 05/18/2022)   cycloSPORINE (RESTASIS) 0.05 % ophthalmic emulsion Place 1 drop into both eyes 2 (two) times daily. (Patient not taking: Reported on 05/18/2022)   Multiple Vitamins-Minerals (PRESERVISION AREDS 2 PO) Take 1 capsule by mouth in the morning and at bedtime.  (Patient not taking: Reported on 05/18/2022)   phenol (CHLORASEPTIC) 1.4 % LIQD Use as directed 1 spray in the mouth or throat as needed for throat irritation / pain. (Patient not taking: Reported on 05/18/2022)   pravastatin (PRAVACHOL) 10 MG tablet Take 1 tablet (10 mg total) by mouth daily. (Patient not taking: Reported on 05/18/2022)   No facility-administered encounter medications on file as of 05/18/2022.    Allergies (verified) Ethylene oxide and Other   History: Past Medical History:  Diagnosis Date   ADHD (attention deficit hyperactivity disorder) 2020   ADD   Anxiety    Arthritis    Broken heart syndrome 2013   Carpal tunnel syndrome    Cataract    Chronic kidney disease    Depression    Diabetes mellitus without complication ([place])    Type 2   Dysrhythmia 1976   tachycardia   Fall 04/10/2015   GERD (gastroesophageal reflux disease)    Goiter    History of shingles    On abdomen, left ring finger, and left leg; Pt takes Valtrex   Hyperlipidemia    Hypertension    Hypothyroidism    lung ca 04/2021   Obesity    PTSD (post-traumatic stress disorder)    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   Shortness of breath dyspnea    pt stated its related to the back problems she has   Thyroid disease    Ulcer    Past Surgical History:  Procedure Laterality Date   ABDOMINAL HYSTERECTOMY  1996   BRONCHIAL BRUSHINGS  06/29/2021   Procedure: BRONCHIAL BRUSHINGS;  Surgeon: Collene Gobble, MD;  Location: [place] [facility];  Service: Pulmonary;;   BRONCHIAL NEEDLE ASPIRATION BIOPSY  06/29/2021   Procedure: BRONCHIAL NEEDLE ASPIRATION BIOPSIES;  Surgeon: Collene Gobble, MD;  Location: [facility];  Service: Pulmonary;;   CATARACT EXTRACTION, BILATERAL     CESAREAN [place], 1984   COLONOSCOPY W/ POLYPECTOMY     EYE SURGERY Bilateral 2014   cataratact and lasik surgery   FIDUCIAL MARKER PLACEMENT  06/29/2021   Procedure: FIDUCIAL DYE MARKING;  Surgeon: Collene Gobble, MD;   Location: [facility];  Service: Pulmonary;;   INTERCOSTAL NERVE BLOCK  06/29/2021   Procedure: INTERCOSTAL NERVE BLOCK;  Surgeon: Lajuana Matte, MD;  Location: [facility];  Service: Thoracic;;   KYPHOPLASTY N/A 06/02/2015   Procedure: KYPHOPLASTY;  Surgeon: Phylliss Bob, MD;  Location: [facility];  Service: Orthopedics;  Laterality: N/A;  Thoracic 3, 8, 10 kyphoplasty   LOBECTOMY  06/29/2021   Procedure: RIGHT UPPER LOBECTOMY;  Surgeon: Lajuana Matte, MD;  Location: [facility];  Service: Thoracic;;   LYMPH NODE DISSECTION  06/29/2021   Procedure: LYMPH NODE DISSECTION;  Surgeon: Lajuana Matte, MD;  Location: [facility];  Service: Thoracic;;   Family History  Problem Relation Age of Onset   COPD Mother    Depression Mother    Hypertension Mother    Hyperlipidemia Mother    Breast cancer Mother 51   Diverticulosis Mother    Heart disease Father    Dementia Father    Breast cancer Sister 34   Non-Hodgkin's lymphoma Sister    Colon cancer Neg Hx    Social History   Socioeconomic History   Marital status: Widowed    Spouse name: Not on file   Number of children: 2   Years of education: Not on file   Highest education level: Not on file  Occupational History   Not on file  Tobacco Use   Smoking status: Former    Packs/day: 0.50    Years: 4.00    Total pack years: 2.00    Types: Cigarettes    Quit date: 06/28/2016    Years since quitting: 5.8   Smokeless tobacco: Never  Vaping Use   Vaping Use: Never used  Substance and Sexual Activity   Alcohol use: Not Currently    Comment: rare glass of wine (holidays only)   Drug use: Never   Sexual activity: Not Currently  Other Topics Concern   Not on file  Social History Narrative   Not on file   Social Determinants of Health   Financial Resource Strain: Low Risk  (05/18/2022)   Overall Financial Resource Strain (CARDIA)    Difficulty of Paying Living Expenses: Not hard at all  Food Insecurity: No Food Insecurity  (05/18/2022)   Hunger Vital Sign    Worried About Running Out of Food in the Last Year: Never true    Ran Out of Food in the Last Year: Never true  Transportation Needs: No Transportation Needs (05/18/2022)   PRAPARE - Hydrologist (Medical): No    Lack of Transportation (Non-Medical): No  Physical Activity: Insufficiently Active (05/18/2022)   Exercise Vital Sign    Days of Exercise per Week: 2 days    Minutes of Exercise per Session: 30 min  Stress: Stress Concern Present (05/18/2022)   [place]    Feeling of Stress : To some extent  Social Connections: Moderately Isolated (05/18/2022)   Social Connection and Isolation Panel [NHANES]    Frequency of Communication with Friends and Family: More than three times a week    Frequency of Social Gatherings with Friends and Family: Once a week    Attends Religious Services: More than 4 times per year    Active Member of [employer] or Organizations: No    Attends Archivist Meetings: Never    Marital Status: Widowed    Tobacco Counseling Counseling given: Not Answered   Clinical Intake:  Pre-visit preparation completed: Yes  Pain : No/denies pain Pain Score:  (n/a)     Diabetes: No CBG done?: No Did pt. bring in CBG monitor from home?: No  How often do you need to have someone help you when you read instructions, pamphlets, or other written materials from your doctor or pharmacy?: 2 - Rarely What is the last grade level you completed in school?: masters degree  Diabetic?no   Interpreter Needed?: No      Activities of Daily Living    05/18/2022   12:06 PM 07/02/2021    6:00 AM  In your present state of health, do you have any difficulty performing the following activities:  Hearing? 0  0  Vision? 0 0  Difficulty concentrating or making decisions? 1 1  Walking or climbing stairs? 0 0  Dressing or bathing? 0 0  Doing  errands, shopping? 0 0    Patient Care Team: Dorna Mai, MD as PCP - General (Family Medicine) Valrie Hart, RN as Oncology Nurse Navigator (Oncology) Phylliss Bob, MD as Consulting Physician (Orthopedic Surgery)  Indicate any recent Medical Services you may have received from other than [facility] providers in the past year (date may be approximate).     Assessment:   This is a routine wellness examination for Jenny Cardenas.  Hearing/Vision screen No results found.  Dietary issues and exercise activities discussed:     Goals Addressed   None   Depression Screen    05/18/2022   12:01 PM 04/23/2022    9:37 AM  PHQ 2/9 Scores  PHQ - 2 Score 4 1  PHQ- 9 Score 14 5    Fall Risk    05/18/2022   12:05 PM 03/29/2022    1:27 PM 10/26/2016    9:21 AM  Fall Risk   Falls in the past year? 1 0 Yes  Number falls in past yr: 0 0 2 or more  Injury with Fall? 0 0 Yes  Risk Factor Category    High Fall Risk  Risk for fall due to : History of fall(s) History of fall(s)   Follow up Falls evaluation completed  Falls evaluation completed    [place]:  Any stairs in or around the home? Yes  If so, are there any without handrails? No  Home free of loose throw rugs in walkways, pet beds, electrical cords, etc? Yes  Adequate lighting in your home to reduce risk of falls? Yes   ASSISTIVE DEVICES UTILIZED TO PREVENT FALLS:  Life alert? No  Use of a cane, walker or w/c? No  Grab bars in the bathroom? No  Shower chair or bench in shower? No  Elevated toilet seat or a handicapped toilet? No   Cognitive Function:    03/29/2022    2:36 PM  MMSE - Mini Mental State Exam  Orientation to time 5  Orientation to Place 5  Registration 3  Attention/ Calculation 5  Recall 3  Language- name 2 objects 2  Language- repeat 1  Language- follow 3 step command 3  Language- read & follow direction 1  Write a sentence 1  Copy design 1  Total score 30         05/18/2022   12:07 PM  6CIT Screen  What Year? 0 points  What time? 3 points  Count back from 20 0 points  Months in reverse 2 points  Repeat phrase 10 points    Immunizations Immunization History  Administered Date(s) Administered   Influenza Split 04/21/2012   Influenza Whole 06/11/2007, 05/25/2008, 05/06/2009, 04/10/2010   Influenza,inj,Quad PF,6+ Mos 04/20/2013   Moderna Sars-Covid-2 Vaccination 10/08/2019, 11/05/2019, 06/22/2020, 09/27/2020   Td 12/13/1994, 04/10/2010    TDAP status: Due, Education has been provided regarding the importance of this vaccine. Advised may receive this vaccine at local pharmacy or Health Dept. Aware to provide a copy of the vaccination record if obtained from local pharmacy or Health Dept. Verbalized acceptance and understanding.  Flu Vaccine status: Due, Education has been provided regarding the importance of this vaccine. Advised may receive this vaccine at local pharmacy or Health Dept. Aware to provide a copy of the vaccination record if obtained from  local pharmacy or Health Dept. Verbalized acceptance and understanding.  Pneumococcal vaccine status: Due, Education has been provided regarding the importance of this vaccine. Advised may receive this vaccine at local pharmacy or Health Dept. Aware to provide a copy of the vaccination record if obtained from local pharmacy or Health Dept. Verbalized acceptance and understanding.   Qualifies for Shingles Vaccine? Yes   Zostavax completed No   Shingrix Completed?: No.    Education has been provided regarding the importance of this vaccine. Patient has been advised to call insurance company to determine out of pocket expense if they have not yet received this vaccine. Advised may also receive vaccine at local pharmacy or Health Dept. Verbalized acceptance and understanding.  Screening Tests Health Maintenance  Topic Date Due   OPHTHALMOLOGY EXAM  Never done   Hepatitis C Screening  Never done    FOOT EXAM  09/14/2014   DEXA SCAN  Never done   COLONOSCOPY (Pts 45-42yrs Insurance coverage will need to be confirmed)  09/18/2018   HEMOGLOBIN A1C  04/02/2019   TETANUS/TDAP  04/10/2020   COVID-19 Vaccine (5 - Moderna risk series) 11/22/2020   Diabetic kidney evaluation - Urine ACR  04/13/2021   MAMMOGRAM  05/02/2022   INFLUENZA VACCINE  05/19/2022 (Originally 03/13/2022)   Zoster Vaccines- Shingrix (1 of 2) 07/23/2022 (Originally 02/25/1970)   Pneumonia Vaccine 30+ Years old (1 - PCV) 04/24/2023 (Originally 02/26/1957)   Diabetic kidney evaluation - GFR measurement  03/21/2023   HPV VACCINES  Aged Out    Health Maintenance  Health Maintenance Due  Topic Date Due   OPHTHALMOLOGY EXAM  Never done   Hepatitis C Screening  Never done   FOOT EXAM  09/14/2014   DEXA SCAN  Never done   COLONOSCOPY (Pts 45-40yrs Insurance coverage will need to be confirmed)  09/18/2018   HEMOGLOBIN A1C  04/02/2019   TETANUS/TDAP  04/10/2020   COVID-19 Vaccine (5 - Moderna risk series) 11/22/2020   Diabetic kidney evaluation - Urine ACR  04/13/2021   MAMMOGRAM  05/02/2022    Colorectal cancer screening: Referral to GI placed provider notified. Pt aware the office will call re: appt.  Mammogram status: Ordered provider notified. Pt provided with contact info and advised to call to schedule appt.   Bone Density status: Ordered 09/19/2022. Pt provided with contact info and advised to call to schedule appt.  Lung Cancer Screening: (Low Dose CT Chest recommended if Age 81-80 years, 30 pack-year currently smoking OR have quit w/in 15years.) does not qualify.   Lung Cancer Screening Referral: n/a  Additional Screening:  Hepatitis C Screening: does qualify; Completed n/a  Vision Screening: Recommended annual ophthalmology exams for early detection of glaucoma and other disorders of the eye. Is the patient up to date with their annual eye exam?  Yes  Who is the provider or what is the name of the office in  which the patient attends annual eye exams? Dr Gaspar Bidding.  If pt is not established with a provider, would they like to be referred to a provider to establish care? No .   Dental Screening: Recommended annual dental exams for proper oral hygiene  Community Resource Referral / Chronic Care Management: CRR required this visit?  No   CCM required this visit?  No      Plan:     I have personally reviewed and noted the following in the patient's chart:   Medical and social history Use of alcohol, tobacco or illicit drugs  Current  medications and supplements including opioid prescriptions. Patient is not currently taking opioid prescriptions. Functional ability and status Nutritional status Physical activity Advanced directives List of other physicians Hospitalizations, surgeries, and ER visits in previous 12 months Vitals Screenings to include cognitive, depression, and falls Referrals and appointments  In addition, I have reviewed and discussed with patient certain preventive protocols, quality metrics, and best practice recommendations. A written personalized care plan for preventive services as well as general preventive health recommendations were provided to patient.     Debbora Dus, [place]   05/18/2022   Nurse Notes: patient reports having upcoming appointment with PCP on 07/23/2022.

## 2022-06-01 DIAGNOSIS — F331 Major depressive disorder, recurrent, moderate: Secondary | ICD-10-CM | POA: Diagnosis not present

## 2022-06-01 DIAGNOSIS — Z79899 Other long term (current) drug therapy: Secondary | ICD-10-CM | POA: Diagnosis not present

## 2022-06-01 DIAGNOSIS — F4312 Post-traumatic stress disorder, chronic: Secondary | ICD-10-CM | POA: Diagnosis not present

## 2022-06-01 DIAGNOSIS — F411 Generalized anxiety disorder: Secondary | ICD-10-CM | POA: Diagnosis not present

## 2022-06-11 DIAGNOSIS — F4312 Post-traumatic stress disorder, chronic: Secondary | ICD-10-CM | POA: Diagnosis not present

## 2022-06-11 DIAGNOSIS — F331 Major depressive disorder, recurrent, moderate: Secondary | ICD-10-CM | POA: Diagnosis not present

## 2022-06-11 DIAGNOSIS — F411 Generalized anxiety disorder: Secondary | ICD-10-CM | POA: Diagnosis not present

## 2022-06-14 ENCOUNTER — Telehealth: Payer: Self-pay | Admitting: *Deleted

## 2022-06-14 NOTE — Patient Outreach (Signed)
  Care Coordination   06/14/2022 Name: Jenny Cardenas MRN: [id number] DOB: August 17, 1950   Care Coordination Outreach Attempts:  An unsuccessful telephone outreach was attempted today to offer the patient information about available care coordination services as a benefit of their health plan.   Follow Up Plan:  Additional outreach attempts will be made to offer the patient care coordination information and services.   Encounter Outcome:  No Answer  Care Coordination Interventions Activated:  No   Care Coordination Interventions:  No, not indicated    Raina Mina, RN Care Management Coordinator [place] Office [phone]

## 2022-06-15 ENCOUNTER — Telehealth: Payer: Self-pay | Admitting: Family Medicine

## 2022-06-15 ENCOUNTER — Telehealth: Payer: Self-pay | Admitting: *Deleted

## 2022-06-15 NOTE — Telephone Encounter (Signed)
Medication Refill - Medication: Xiidra Eye drops individual viles     Pt requesting eye drops "Had about a year ago from Dr. Justin Mend", previous provider. Not on current or past  med profile. Assured pt NT would route to practice for PCPs review. After hours call.

## 2022-06-15 NOTE — Telephone Encounter (Signed)
Called pt - Mail box is full.  Called daughter Earnest Bailey - [place]

## 2022-06-15 NOTE — Telephone Encounter (Signed)
Medication Refill - Medication: Xiidra Eye drops individual viles    Has the patient contacted their pharmacy? Yes.   (Agent: If no, request that the patient contact the pharmacy for the refill. If patient does not wish to contact the pharmacy document the reason why and proceed with request.) (Agent: If yes, when and what did the pharmacy advise?)  Preferred Pharmacy (with phone number or street name):   [facility] [address], [address]  [place] [address]  Phone: [phone] Fax: [phone]    Has the patient been seen for an appointment in the last year OR does the patient have an upcoming appointment? Yes.    Agent: Please be advised that RX refills may take up to 3 business days. We ask that you follow-up with your pharmacy.

## 2022-06-18 DIAGNOSIS — Z23 Encounter for immunization: Secondary | ICD-10-CM | POA: Diagnosis not present

## 2022-06-22 NOTE — Telephone Encounter (Signed)
I have attempted without success to contact this patient by phone to return their call.

## 2022-06-25 ENCOUNTER — Encounter (HOSPITAL_COMMUNITY): Payer: Self-pay

## 2022-06-25 ENCOUNTER — Inpatient Hospital Stay: Payer: Medicare Other | Attending: Internal Medicine

## 2022-06-25 ENCOUNTER — Ambulatory Visit (HOSPITAL_COMMUNITY)
Admission: RE | Admit: 2022-06-25 | Discharge: 2022-06-25 | Disposition: A | Discharge status: Home / Self Care | Payer: Medicare Other | Source: Ambulatory Visit | Attending: Internal Medicine | Admitting: Internal Medicine

## 2022-06-25 ENCOUNTER — Other Ambulatory Visit: Payer: Self-pay

## 2022-06-25 DIAGNOSIS — I700 Atherosclerosis of aorta: Secondary | ICD-10-CM | POA: Insufficient documentation

## 2022-06-25 DIAGNOSIS — I129 Hypertensive chronic kidney disease with stage 1 through stage 4 chronic kidney disease, or unspecified chronic kidney disease: Secondary | ICD-10-CM | POA: Insufficient documentation

## 2022-06-25 DIAGNOSIS — K760 Fatty (change of) liver, not elsewhere classified: Secondary | ICD-10-CM | POA: Insufficient documentation

## 2022-06-25 DIAGNOSIS — Z79899 Other long term (current) drug therapy: Secondary | ICD-10-CM | POA: Insufficient documentation

## 2022-06-25 DIAGNOSIS — R911 Solitary pulmonary nodule: Secondary | ICD-10-CM | POA: Diagnosis not present

## 2022-06-25 DIAGNOSIS — E039 Hypothyroidism, unspecified: Secondary | ICD-10-CM | POA: Insufficient documentation

## 2022-06-25 DIAGNOSIS — C3490 Malignant neoplasm of unspecified part of unspecified bronchus or lung: Secondary | ICD-10-CM | POA: Insufficient documentation

## 2022-06-25 DIAGNOSIS — Z902 Acquired absence of lung [part of]: Secondary | ICD-10-CM | POA: Insufficient documentation

## 2022-06-25 DIAGNOSIS — N189 Chronic kidney disease, unspecified: Secondary | ICD-10-CM | POA: Insufficient documentation

## 2022-06-25 DIAGNOSIS — Z85118 Personal history of other malignant neoplasm of bronchus and lung: Secondary | ICD-10-CM | POA: Insufficient documentation

## 2022-06-25 DIAGNOSIS — E1122 Type 2 diabetes mellitus with diabetic chronic kidney disease: Secondary | ICD-10-CM | POA: Insufficient documentation

## 2022-06-25 LAB — CBC WITH DIFFERENTIAL (CANCER CENTER ONLY)
Abs Immature Granulocytes: 0.03 10*3/uL (ref 0.00–0.07)
Basophils Absolute: 0 10*3/uL (ref 0.0–0.1)
Basophils Relative: 1 %
Eosinophils Absolute: 0.3 10*3/uL (ref 0.0–0.5)
Eosinophils Relative: 4 %
HCT: 38.7 % (ref 36.0–46.0)
Hemoglobin: 12.4 g/dL (ref 12.0–15.0)
Immature Granulocytes: 1 %
Lymphocytes Relative: 30 %
Lymphs Abs: 1.9 10*3/uL (ref 0.7–4.0)
MCH: 27.3 pg (ref 26.0–34.0)
MCHC: 32 g/dL (ref 30.0–36.0)
MCV: 85.2 fL (ref 80.0–100.0)
Monocytes Absolute: 0.4 10*3/uL (ref 0.1–1.0)
Monocytes Relative: 6 %
Neutro Abs: 3.6 10*3/uL (ref 1.7–7.7)
Neutrophils Relative %: 58 %
Platelet Count: 259 10*3/uL (ref 150–400)
RBC: 4.54 MIL/uL (ref 3.87–5.11)
RDW: 13.8 % (ref 11.5–15.5)
WBC Count: 6.2 10*3/uL (ref 4.0–10.5)
nRBC: 0 % (ref 0.0–0.2)

## 2022-06-25 LAB — CMP (CANCER CENTER ONLY)
ALT: 14 U/L (ref 0–44)
AST: 19 U/L (ref 15–41)
Albumin: 4.2 g/dL (ref 3.5–5.0)
Alkaline Phosphatase: 101 U/L (ref 38–126)
Anion gap: 7 (ref 5–15)
BUN: 17 mg/dL (ref 8–23)
CO2: 31 mmol/L (ref 22–32)
Calcium: 9 mg/dL (ref 8.9–10.3)
Chloride: 104 mmol/L (ref 98–111)
Creatinine: 1.25 mg/dL — ABNORMAL HIGH (ref 0.44–1.00)
GFR, Estimated: 46 mL/min — ABNORMAL LOW (ref >60)
Glucose, Bld: 145 mg/dL — ABNORMAL HIGH (ref 70–99)
Potassium: 3.7 mmol/L (ref 3.5–5.1)
Sodium: 142 mmol/L (ref 135–145)
Total Bilirubin: 0.4 mg/dL (ref 0.3–1.2)
Total Protein: 7.1 g/dL (ref 6.5–8.1)

## 2022-06-25 MED ORDER — IOHEXOL 300 MG/ML  SOLN
75.0000 mL | Freq: Once | INTRAMUSCULAR | Status: AC | PRN
  Administered 2022-06-25: 75 mL via INTRAVENOUS

## 2022-06-25 MED ORDER — SODIUM CHLORIDE (PF) 0.9 % IJ SOLN
INTRAMUSCULAR | Status: AC
  Filled 2022-06-25: qty 50

## 2022-06-28 ENCOUNTER — Other Ambulatory Visit: Payer: Self-pay

## 2022-06-28 ENCOUNTER — Inpatient Hospital Stay (HOSPITAL_BASED_OUTPATIENT_CLINIC_OR_DEPARTMENT_OTHER): Payer: Medicare Other | Admitting: Internal Medicine

## 2022-06-28 VITALS — BP 141/71 | HR 88 | Temp 98.4°F | Resp 16 | Wt 183.4 lb

## 2022-06-28 DIAGNOSIS — N189 Chronic kidney disease, unspecified: Secondary | ICD-10-CM | POA: Diagnosis not present

## 2022-06-28 DIAGNOSIS — Z902 Acquired absence of lung [part of]: Secondary | ICD-10-CM | POA: Diagnosis not present

## 2022-06-28 DIAGNOSIS — Z85118 Personal history of other malignant neoplasm of bronchus and lung: Secondary | ICD-10-CM | POA: Diagnosis not present

## 2022-06-28 DIAGNOSIS — C3490 Malignant neoplasm of unspecified part of unspecified bronchus or lung: Secondary | ICD-10-CM | POA: Diagnosis not present

## 2022-06-28 DIAGNOSIS — I700 Atherosclerosis of aorta: Secondary | ICD-10-CM | POA: Diagnosis not present

## 2022-06-28 DIAGNOSIS — K760 Fatty (change of) liver, not elsewhere classified: Secondary | ICD-10-CM | POA: Diagnosis not present

## 2022-06-28 DIAGNOSIS — E1122 Type 2 diabetes mellitus with diabetic chronic kidney disease: Secondary | ICD-10-CM | POA: Diagnosis not present

## 2022-06-28 DIAGNOSIS — Z79899 Other long term (current) drug therapy: Secondary | ICD-10-CM | POA: Diagnosis not present

## 2022-06-28 DIAGNOSIS — I129 Hypertensive chronic kidney disease with stage 1 through stage 4 chronic kidney disease, or unspecified chronic kidney disease: Secondary | ICD-10-CM | POA: Diagnosis not present

## 2022-06-28 DIAGNOSIS — E039 Hypothyroidism, unspecified: Secondary | ICD-10-CM | POA: Diagnosis not present

## 2022-06-28 NOTE — Progress Notes (Signed)
[place] [phone]   [phone]  OFFICE PROGRESS NOTE  Pcp, No No address on file  DIAGNOSIS: Stage IA (T1 a, N0, M0) non-small cell lung cancer, adenocarcinoma  PRIOR THERAPY:  status post right upper lobectomy with lymph node dissection under the care of Dr. Kipp Brood on June 29, 2021.  The tumor size was 0.8 cm.  CURRENT THERAPY: Observation.  INTERVAL HISTORY: Jenny Cardenas 71 y.o. female returns to the clinic today for follow-up visit accompanied by her daughter.  The patient is feeling fine today with no concerning complaints except for some psychological issues and she is currently seen by a psychiatrist for adjustment of her medications.  She denied having any chest pain but has shortness of breath with exertion with no cough or hemoptysis.  She has no nausea, vomiting, diarrhea or constipation.  She has no headache or visual changes.  She denied having any weight loss or night sweats.  She is here today for evaluation with repeat CT scan of the chest for restaging of her disease.  MEDICAL HISTORY: Past Medical History:  Diagnosis Date   ADHD (attention deficit hyperactivity disorder) 2020   ADD   Anxiety    Arthritis    Broken heart syndrome 2013   Carpal tunnel syndrome    Cataract    Chronic kidney disease    Depression    Diabetes mellitus without complication ([place])    Type 2   Dysrhythmia 1976   tachycardia   Fall 04/10/2015   GERD (gastroesophageal reflux disease)    Goiter    History of shingles    On abdomen, left ring finger, and left leg; Pt takes Valtrex   Hyperlipidemia    Hypertension    Hypothyroidism    lung ca 04/2021   Obesity    PTSD (post-traumatic stress disorder)    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   Shortness of breath dyspnea    pt stated its related to the back problems she has   Thyroid disease    Ulcer     ALLERGIES:  is allergic to ethylene oxide and other.  MEDICATIONS:   Current Outpatient Medications  Medication Sig Dispense Refill   albuterol (PROVENTIL) (2.5 MG/3ML) 0.083% nebulizer solution Take 2.5 mg by nebulization every 4 (four) hours as needed for wheezing or shortness of breath.   10   ALPRAZolam (XANAX) 0.5 MG tablet Take 1 tablet (0.5 mg total) by mouth 3 (three) times daily as needed for anxiety. 10 tablet 0   amphetamine-dextroamphetamine (ADDERALL) 10 MG tablet Take 1 tablet (10 mg total) by mouth every morning. 10 tablet 0   buPROPion (WELLBUTRIN XL) 150 MG 24 hr tablet Take 150 mg by mouth daily.     cycloSPORINE (RESTASIS) 0.05 % ophthalmic emulsion Place 1 drop into both eyes 2 (two) times daily.     diphenhydramine-acetaminophen (TYLENOL PM) 25-500 MG TABS tablet Take 2 tablets by mouth at bedtime as needed.     furosemide (LASIX) 20 MG tablet Take 20 mg by mouth daily. May take an additional dose if needed for swelling     guaiFENesin (MUCINEX) 600 MG 12 hr tablet Take 1 tablet (600 mg total) by mouth 2 (two) times daily.     ibuprofen (ADVIL) 200 MG tablet Take 200 mg by mouth as needed.     levothyroxine (SYNTHROID, LEVOTHROID) 25 MCG tablet Take 25 mcg by mouth daily before breakfast.     lidocaine (  LIDODERM) 5 % Place 1 patch onto the skin daily as needed (irritation). Remove & Discard patch within 12 hours or as directed by MD     Multiple Vitamins-Minerals (PRESERVISION AREDS 2 PO) Take 1 capsule by mouth in the morning and at bedtime.     pantoprazole (PROTONIX) 40 MG tablet Take 40 mg by mouth daily.     phenol (CHLORASEPTIC) 1.4 % LIQD Use as directed 1 spray in the mouth or throat as needed for throat irritation / pain.  0   pravastatin (PRAVACHOL) 10 MG tablet Take 10 mg by mouth daily.     sertraline (ZOLOFT) 50 MG tablet Take 75 mg by mouth daily.     tiZANidine (ZANAFLEX) 4 MG tablet Take 1 tablet (4 mg total) by mouth every 8 (eight) hours as needed for muscle spasms. 30 tablet 0   traMADol (ULTRAM) 50 MG tablet Take 1 tablet  (50 mg total) by mouth every 6 (six) hours as needed (mild pain). 30 tablet 0   No current facility-administered medications for this visit.    SURGICAL HISTORY:  Past Surgical History:  Procedure Laterality Date   ABDOMINAL HYSTERECTOMY  1996   BRONCHIAL BRUSHINGS  06/29/2021   Procedure: BRONCHIAL BRUSHINGS;  Surgeon: Collene Gobble, MD;  [facility] ENDOSCOPY;  Service: Pulmonary;;   BRONCHIAL NEEDLE ASPIRATION BIOPSY  06/29/2021   Procedure: BRONCHIAL NEEDLE ASPIRATION BIOPSIES;  Surgeon: Collene Gobble, MD;  Location: [place] ENDOSCOPY;  Service: Pulmonary;;   CATARACT EXTRACTION, BILATERAL     CESAREAN [place], 1984   COLONOSCOPY W/ POLYPECTOMY     EYE SURGERY Bilateral 2014   cataratact and lasik surgery   FIDUCIAL MARKER PLACEMENT  06/29/2021   Procedure: FIDUCIAL DYE MARKING;  Surgeon: Collene Gobble, MD;  Location: [facility] ENDOSCOPY;  Service: Pulmonary;;   INTERCOSTAL NERVE BLOCK  06/29/2021   Procedure: INTERCOSTAL NERVE BLOCK;  Surgeon: Lajuana Matte, MD;  Location: [place];  Service: Thoracic;;   KYPHOPLASTY N/A 06/02/2015   Procedure: KYPHOPLASTY;  Surgeon: Phylliss Bob, MD;  Location: [place];  Service: Orthopedics;  Laterality: N/A;  Thoracic 3, 8, 10 kyphoplasty   LOBECTOMY  06/29/2021   Procedure: RIGHT UPPER LOBECTOMY;  Surgeon: Lajuana Matte, MD;  Location: [place];  Service: Thoracic;;   LYMPH NODE DISSECTION  06/29/2021   Procedure: LYMPH NODE DISSECTION;  Surgeon: Lajuana Matte, MD;  Location: [place];  Service: Thoracic;;    REVIEW OF SYSTEMS:  A comprehensive review of systems was negative except for: Constitutional: positive for fatigue Respiratory: positive for dyspnea on exertion Behavioral/Psych: positive for anxiety   PHYSICAL EXAMINATION: General appearance: alert, cooperative, appears stated age, and fatigued Head: Normocephalic, without obvious abnormality, atraumatic Neck: no adenopathy, no JVD, supple, symmetrical, trachea  midline, and thyroid not enlarged, symmetric, no tenderness/mass/nodules Lymph nodes: Cervical, supraclavicular, and axillary nodes normal. Resp: clear to auscultation bilaterally Back: symmetric, no curvature. ROM normal. No CVA tenderness. Cardio: regular rate and rhythm, S1, S2 normal, no murmur, click, rub or gallop GI: soft, non-tender; bowel sounds normal; no masses,  no organomegaly Extremities: extremities normal, atraumatic, no cyanosis or edema  ECOG PERFORMANCE STATUS: 1 - Symptomatic but completely ambulatory  Blood pressure (!) 140/78, pulse 84, temperature 98.3 F (36.8 C), temperature source Oral, resp. rate 16, weight 178 lb 9 oz (81 kg), SpO2 99 %.  LABORATORY DATA: Lab Results  Component Value Date   WBC 8.7 03/20/2022   HGB 12.2 03/20/2022   HCT  38.1 03/20/2022   MCV 84.3 03/20/2022   PLT 296 03/20/2022      Chemistry      Component Value Date/Time   NA 143 03/20/2022 0830   K 4.1 03/20/2022 0830   CL 109 03/20/2022 0830   CO2 27 03/20/2022 0830   BUN 15 03/20/2022 0830   CREATININE 1.05 (H) 03/20/2022 0830   CREATININE 0.83 10/26/2016 1039      Component Value Date/Time   CALCIUM 8.6 (L) 03/20/2022 0830   ALKPHOS 109 03/20/2022 0830   AST 14 (L) 03/20/2022 0830   ALT 11 03/20/2022 0830   BILITOT 0.3 03/20/2022 0830       RADIOGRAPHIC STUDIES: CT Chest W Contrast  Result Date: 06/26/2022 CLINICAL DATA:  Follow-up of non-small cell lung cancer. * Tracking Code: BO * EXAM: CT CHEST WITH CONTRAST TECHNIQUE: Multidetector CT imaging of the chest was performed during intravenous contrast administration. RADIATION DOSE REDUCTION: This exam was performed according to the departmental dose-optimization program which includes automated exposure control, adjustment of the mA and/or kV according to patient size and/or use of iterative reconstruction technique. CONTRAST:  5mL OMNIPAQUE IOHEXOL 300 MG/ML  SOLN COMPARISON:  March 20, 2022. FINDINGS:  Cardiovascular: Calcified and noncalcified aortic atherosclerotic plaque. Normal heart size. No pericardial effusion or nodularity. Three-vessel coronary artery calcification with extensive coronary artery disease as seen on previous imaging. Central pulmonary vasculature is unremarkable aside from distortion of the RIGHT hilum following RIGHT upper lobectomy. Mediastinum/Nodes: No thoracic inlet, axillary, mediastinal or hilar adenopathy. Esophagus grossly normal. Stable bilateral thyroid nodules, largest in the RIGHT hemi thyroid may be as large as 1.6 cm. Lungs/Pleura: Juxta diaphragmatic nodularity just over the RIGHT hemidiaphragm (image 94/6) 4 mm, unchanged compared to prior imaging dating back to November of 2022. RIGHT upper lobe nodule (image 24/6) 6 mm, unchanged compared to the August evaluation. Airways are patent. No consolidation or sign of pleural effusion. Scarring about the RIGHT hilum following RIGHT upper lobectomy. Upper Abdomen: Signs of renal cortical scarring greatest on the LEFT. Kidneys are incompletely imaged. Imaged portions of the liver, gallbladder, pancreas, spleen and adrenal glands without acute findings. Signs of hepatic steatosis with lobular hepatic contours. No upper abdominal lymphadenopathy. Musculoskeletal: No acute or destructive bone findings. Cement augmentation at the T3, T8 and T10 levels with similar appearance. IMPRESSION: 1. Status post RIGHT upper lobectomy as before. 2. Stable 6 mm RIGHT middle lobe pulmonary nodule. This was a new finding on previous imaging and despite lack of change over the interval raises the question of early stigmata of metastatic disease. Lack of change is reassuring. Short interval follow-up may be helpful. 3. Juxta diaphragmatic nodularity just over the RIGHT hemidiaphragm 4 mm, unchanged compared to prior imaging dating back to November of 2022. Attention on follow-up. 4. No new or progressive findings. 5. 1.6 cm RIGHT thyroid nodule.  Recommend thyroid ultrasound if not previously performed. 6. Signs of hepatic steatosis with lobular hepatic contours. Correlate with any clinical or laboratory evidence of liver disease. 7. Three-vessel coronary artery calcification with extensive coronary artery disease as seen on previous imaging. 8. Aortic atherosclerosis. Aortic Atherosclerosis (ICD10-I70.0). Electronically Signed   By: Zetta Bills M.D.   On: 06/26/2022 09:15    ASSESSMENT AND PLAN: This is a very pleasant 71 years old white female with stage IA (T1 a, N0, M0) non-small cell lung cancer, adenocarcinoma status post right upper lobectomy with lymph node dissection under the care of Dr. Kipp Brood on June 29, 2021. The patient  is currently on observation and she is feeling fine with no concerning complaints. She was found on previous CT scan of the chest in August 2023 to have new 0.6 cm right middle lobe lung nodule with slight enlargement of a right lower lobe nodule and these are concerning for possible metastatic disease. The patient continues on observation and repeat CT scan of the chest performed recently showed no significant change in the right middle lobe pulmonary nodule which still have the same size. I discussed the scan result with the patient and her daughter and recommended for her to continue on observation with repeat CT scan of the chest in around 4 months for close monitoring of this pulmonary nodule. For the hepatic steatosis as well as aortic atherosclerosis, she will continue her routine follow-up visit and evaluation by her primary care physician. The patient was advised to call immediately if she has any other concerning symptoms in the interval. The patient voices understanding of current disease status and treatment options and is in agreement with the current care plan.  All questions were answered. The patient knows to call the clinic with any problems, questions or concerns. We can certainly see the  patient much sooner if necessary. The total time spent in the appointment was 20 minutes.  Disclaimer: This note was dictated with voice recognition software. Similar sounding words can inadvertently be transcribed and may not be corrected upon review.

## 2022-07-02 DIAGNOSIS — F909 Attention-deficit hyperactivity disorder, unspecified type: Secondary | ICD-10-CM | POA: Diagnosis not present

## 2022-07-02 DIAGNOSIS — E039 Hypothyroidism, unspecified: Secondary | ICD-10-CM | POA: Diagnosis not present

## 2022-07-02 DIAGNOSIS — F4312 Post-traumatic stress disorder, chronic: Secondary | ICD-10-CM | POA: Diagnosis not present

## 2022-07-02 DIAGNOSIS — F331 Major depressive disorder, recurrent, moderate: Secondary | ICD-10-CM | POA: Diagnosis not present

## 2022-07-02 DIAGNOSIS — F411 Generalized anxiety disorder: Secondary | ICD-10-CM | POA: Diagnosis not present

## 2022-07-07 DIAGNOSIS — Z20822 Contact with and (suspected) exposure to covid-19: Secondary | ICD-10-CM | POA: Diagnosis not present

## 2022-07-07 DIAGNOSIS — Z1152 Encounter for screening for COVID-19: Secondary | ICD-10-CM | POA: Diagnosis not present

## 2022-07-10 DIAGNOSIS — F411 Generalized anxiety disorder: Secondary | ICD-10-CM | POA: Diagnosis not present

## 2022-07-10 DIAGNOSIS — F909 Attention-deficit hyperactivity disorder, unspecified type: Secondary | ICD-10-CM | POA: Diagnosis not present

## 2022-07-10 DIAGNOSIS — F331 Major depressive disorder, recurrent, moderate: Secondary | ICD-10-CM | POA: Diagnosis not present

## 2022-07-23 ENCOUNTER — Ambulatory Visit (INDEPENDENT_AMBULATORY_CARE_PROVIDER_SITE_OTHER): Payer: Medicare Other | Admitting: Family Medicine

## 2022-07-23 VITALS — BP 130/75 | HR 80 | Temp 98.1°F | Resp 16 | Wt 174.4 lb

## 2022-07-23 DIAGNOSIS — E0590 Thyrotoxicosis, unspecified without thyrotoxic crisis or storm: Secondary | ICD-10-CM | POA: Diagnosis not present

## 2022-07-23 DIAGNOSIS — E1169 Type 2 diabetes mellitus with other specified complication: Secondary | ICD-10-CM | POA: Diagnosis not present

## 2022-07-23 DIAGNOSIS — I10 Essential (primary) hypertension: Secondary | ICD-10-CM | POA: Diagnosis not present

## 2022-07-23 DIAGNOSIS — B372 Candidiasis of skin and nail: Secondary | ICD-10-CM | POA: Diagnosis not present

## 2022-07-23 DIAGNOSIS — E669 Obesity, unspecified: Secondary | ICD-10-CM | POA: Diagnosis not present

## 2022-07-23 DIAGNOSIS — Z2911 Encounter for prophylactic immunotherapy for respiratory syncytial virus (RSV): Secondary | ICD-10-CM | POA: Diagnosis not present

## 2022-07-23 MED ORDER — CYCLOSPORINE 0.05 % OP EMUL: 1.0000 [drp] | Freq: Two times a day (BID) | OPHTHALMIC | 2 refills | Status: DC

## 2022-07-23 MED ORDER — FLUCONAZOLE 200 MG PO TABS: 200.0000 mg | ORAL_TABLET | Freq: Every day | ORAL | 0 refills | Status: DC

## 2022-07-23 NOTE — Progress Notes (Unsigned)
Patient is here for their 3/6 month follow-up Patient has no concerns today Care gaps have been discussed with patient

## 2022-07-24 ENCOUNTER — Other Ambulatory Visit: Payer: Self-pay

## 2022-07-24 MED ORDER — RSVPREF3 VAC RECOMB ADJUVANTED 120 MCG/0.5ML IM SUSR
INTRAMUSCULAR | 0 refills | Status: DC
  Filled 2022-07-24: qty 1, 1d supply, fill #0

## 2022-07-26 ENCOUNTER — Encounter: Payer: Self-pay | Admitting: Family Medicine

## 2022-07-26 NOTE — Progress Notes (Signed)
Established Patient Office Visit  Subjective    Patient ID: Jenny Cardenas, female    DOB: Sep 16, 1950  Age: 71 y.o. MRN: [id number]  CC:  Chief Complaint  Patient presents with   Follow-up    HPI Jenny Cardenas presents for follow up of chronic med issues. Patient reports that she has an itchy rash in her skin folds.    Outpatient Encounter Medications as of 07/23/2022  Medication Sig   albuterol (PROVENTIL) (2.5 MG/3ML) 0.083% nebulizer solution Take 3 mLs (2.5 mg total) by nebulization every 4 (four) hours as needed for wheezing or shortness of breath.   albuterol (VENTOLIN HFA) 108 (90 Base) MCG/ACT inhaler Inhale 1-2 puffs into the lungs every 6 (six) hours as needed for wheezing or shortness of breath. This is a one-time refill from me until the patient establish care with her primary care physician.  Please do not send request for refill to me in the future.   buPROPion (WELLBUTRIN XL) 150 MG 24 hr tablet Take 1 tablet (150 mg total) by mouth daily.   diphenhydramine-acetaminophen (TYLENOL PM) 25-500 MG TABS tablet Take 2 tablets by mouth at bedtime as needed.   fluconazole (DIFLUCAN) 200 MG tablet Take 1 tablet (200 mg total) by mouth daily.   furosemide (LASIX) 20 MG tablet Take 1 tablet (20 mg total) by mouth daily. May take an additional dose if needed for swelling   guaiFENesin (MUCINEX) 600 MG 12 hr tablet Take 1 tablet (600 mg total) by mouth 2 (two) times daily.   ibuprofen (ADVIL) 200 MG tablet Take 200 mg by mouth as needed.   levocetirizine (XYZAL) 5 MG tablet Take 5 mg by mouth at bedtime.   levothyroxine (SYNTHROID) 25 MCG tablet Take 1 tablet (25 mcg total) by mouth daily before breakfast.   lidocaine (LIDODERM) 5 % Place 1 patch onto the skin daily as needed (irritation). Remove & Discard patch within 12 hours or as directed by MD   pantoprazole (PROTONIX) 40 MG tablet Take 1 tablet (40 mg total) by mouth daily.   PRESCRIPTION MEDICATION Natrol memory complex daily    Probiotic Product (PROBIOTIC DAILY PO) Take by mouth daily.   sertraline (ZOLOFT) 100 MG tablet Take 1 tablet (100 mg total) by mouth daily.   traMADol (ULTRAM) 50 MG tablet Take 1 tablet (50 mg total) by mouth every 6 (six) hours as needed (mild pain).   [DISCONTINUED] cycloSPORINE (RESTASIS) 0.05 % ophthalmic emulsion Place 1 drop into both eyes 2 (two) times daily.   ALPRAZolam (XANAX) 0.5 MG tablet Take 1 tablet (0.5 mg total) by mouth 3 (three) times daily as needed for anxiety. (Patient not taking: Reported on 05/18/2022)   cycloSPORINE (RESTASIS) 0.05 % ophthalmic emulsion Place 1 drop into both eyes 2 (two) times daily. (Patient not taking: Reported on 05/18/2022)   cycloSPORINE (RESTASIS) 0.05 % ophthalmic emulsion Place 1 drop into both eyes 2 (two) times daily.   Multiple Vitamins-Minerals (PRESERVISION AREDS 2 PO) Take 1 capsule by mouth in the morning and at bedtime. (Patient not taking: Reported on 05/18/2022)   phenol (CHLORASEPTIC) 1.4 % LIQD Use as directed 1 spray in the mouth or throat as needed for throat irritation / pain. (Patient not taking: Reported on 05/18/2022)   pravastatin (PRAVACHOL) 10 MG tablet Take 1 tablet (10 mg total) by mouth daily. (Patient not taking: Reported on 05/18/2022)   [DISCONTINUED] tiZANidine (ZANAFLEX) 4 MG tablet Take 1 tablet (4 mg total) by mouth every 8 (eight) hours  as needed for muscle spasms.   No facility-administered encounter medications on file as of 07/23/2022.    Past Medical History:  Diagnosis Date   ADHD (attention deficit hyperactivity disorder) 2020   ADD   Anxiety    Arthritis    Broken heart syndrome 2013   Carpal tunnel syndrome    Cataract    Chronic kidney disease    Depression    Diabetes mellitus without complication ([place])    Type 2   Dysrhythmia 1976   tachycardia   Fall 04/10/2015   GERD (gastroesophageal reflux disease)    Goiter    History of shingles    On abdomen, left ring finger, and left leg; Pt takes  Valtrex   Hyperlipidemia    Hypertension    Hypothyroidism    lung ca 04/2021   Obesity    PTSD (post-traumatic stress disorder)    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   Shortness of breath dyspnea    pt stated its related to the back problems she has   Thyroid disease    Ulcer     Past Surgical History:  Procedure Laterality Date   ABDOMINAL HYSTERECTOMY  1996   BRONCHIAL BRUSHINGS  06/29/2021   Procedure: BRONCHIAL BRUSHINGS;  Surgeon: Collene Gobble, MD;  Location: [facility] ENDOSCOPY;  Service: Pulmonary;;   BRONCHIAL NEEDLE ASPIRATION BIOPSY  06/29/2021   Procedure: BRONCHIAL NEEDLE ASPIRATION BIOPSIES;  Surgeon: Collene Gobble, MD;  Location: [place] ENDOSCOPY;  Service: Pulmonary;;   CATARACT EXTRACTION, BILATERAL     CESAREAN [place], 1984   COLONOSCOPY W/ POLYPECTOMY     EYE SURGERY Bilateral 2014   cataratact and lasik surgery   FIDUCIAL MARKER PLACEMENT  06/29/2021   Procedure: FIDUCIAL DYE MARKING;  Surgeon: Collene Gobble, MD;  Location: [place] ENDOSCOPY;  Service: Pulmonary;;   INTERCOSTAL NERVE BLOCK  06/29/2021   Procedure: INTERCOSTAL NERVE BLOCK;  Surgeon: Lajuana Matte, MD;  Location: [place];  Service: Thoracic;;   KYPHOPLASTY N/A 06/02/2015   Procedure: KYPHOPLASTY;  Surgeon: Phylliss Bob, MD;  Location: [place];  Service: Orthopedics;  Laterality: N/A;  Thoracic 3, 8, 10 kyphoplasty   LOBECTOMY  06/29/2021   Procedure: RIGHT UPPER LOBECTOMY;  Surgeon: Lajuana Matte, MD;  Location: [place] [facility];  Service: Thoracic;;   LYMPH NODE DISSECTION  06/29/2021   Procedure: LYMPH NODE DISSECTION;  Surgeon: Lajuana Matte, MD;  Location: [facility] OR;  Service: Thoracic;;    Family History  Problem Relation Age of Onset   COPD Mother    Depression Mother    Hypertension Mother    Hyperlipidemia Mother    Breast cancer Mother 60   Diverticulosis Mother    Heart disease Father    Dementia Father    Breast cancer Sister 23   Non-Hodgkin's lymphoma Sister     Colon cancer Neg Hx     Social History   Socioeconomic History   Marital status: Widowed    Spouse name: Not on file   Number of children: 2   Years of education: Not on file   Highest education level: Not on file  Occupational History   Not on file  Tobacco Use   Smoking status: Former    Packs/day: 0.50    Years: 4.00    Total pack years: 2.00    Types: Cigarettes    Quit date: 06/28/2016    Years since quitting: 6.0   Smokeless tobacco: Never  Vaping Use  Vaping Use: Never used  Substance and Sexual Activity   Alcohol use: Not Currently    Comment: rare glass of wine (holidays only)   Drug use: Never   Sexual activity: Not Currently  Other Topics Concern   Not on file  Social History Narrative   Not on file   Social Determinants of Health   Financial Resource Strain: Low Risk  (05/18/2022)   Overall Financial Resource Strain (CARDIA)    Difficulty of Paying Living Expenses: Not hard at all  Food Insecurity: No Food Insecurity (05/18/2022)   Hunger Vital Sign    Worried About Running Out of Food in the Last Year: Never true    Ran Out of Food in the Last Year: Never true  Transportation Needs: No Transportation Needs (05/18/2022)   PRAPARE - Hydrologist (Medical): No    Lack of Transportation (Non-Medical): No  Physical Activity: Insufficiently Active (05/18/2022)   Exercise Vital Sign    Days of Exercise per Week: 2 days    Minutes of Exercise per Session: 30 min  Stress: Stress Concern Present (05/18/2022)   [place]    Feeling of Stress : To some extent  Social Connections: Moderately Isolated (05/18/2022)   Social Connection and Isolation Panel [NHANES]    Frequency of Communication with Friends and Family: More than three times a week    Frequency of Social Gatherings with Friends and Family: Once a week    Attends Religious Services: More than 4 times per year     Active Member of [employer] or Organizations: No    Attends Archivist Meetings: Never    Marital Status: Widowed  Intimate Partner Violence: Not At Risk (05/18/2022)   Humiliation, Afraid, Rape, and Kick questionnaire    Fear of Current or Ex-Partner: No    Emotionally Abused: No    Physically Abused: No    Sexually Abused: No    Review of Systems  Skin:  Positive for itching and rash.  All other systems reviewed and are negative.       Objective    BP 130/75   Pulse 80   Temp 98.1 F (36.7 C) (Oral)   Resp 16   Wt 174 lb 6.4 oz (79.1 kg)   SpO2 94%   BMI 32.42 kg/m   Physical Exam Vitals and nursing note reviewed.  Constitutional:      General: She is not in acute distress. Cardiovascular:     Rate and Rhythm: Normal rate and regular rhythm.  Pulmonary:     Effort: Pulmonary effort is normal.     Breath sounds: Normal breath sounds.  Abdominal:     Palpations: Abdomen is soft.     Tenderness: There is no abdominal tenderness.  Neurological:     General: No focal deficit present.     Mental Status: She is alert and oriented to person, place, and time.         Assessment & Plan:   1. Essential hypertension Appears stable. continue  2. Hyperthyroidism Continue management as per consultant  3. Diabetes mellitus type 2 in obese [facility]) Continue management as per consultant  4. Intertriginous candidiasis Diflucan prescribed  5. Need for RSV immunization  - RSV,Recombinant PF (Arexvy)    Return in about 3 months (around 10/22/2022) for follow up.   Becky Sax, MD

## 2022-08-01 ENCOUNTER — Telehealth: Payer: Self-pay | Admitting: Family Medicine

## 2022-08-01 ENCOUNTER — Ambulatory Visit: Payer: Self-pay | Admitting: *Deleted

## 2022-08-01 NOTE — Telephone Encounter (Signed)
Reason for Disposition  [1] Caller has URGENT medicine question about med that PCP or specialist prescribed AND [2] triager unable to answer question    Pt lost her medications 2 weeks ago.   Needs new refills.  Answer Assessment - Initial Assessment Questions 1. NAME of MEDICINE: "What medicine(s) are you calling about?"     Jovita Gamma, daughter calling in.    My mother lost all her medications. I need a med list of all her medications.   She has been off of them for a couple of weeks now.     She has problems with her memory and she doesn't know how or where she lost them.  She's been under a lot of stress lately.   2. QUESTION: "What is your question?" (e.g., double dose of medicine, side effect)      I don't know how she lost them.    She's lost them a couple of weeks ago.    I need a list of her medications so I can find out what she needs to get filled.  She needs the Wellbutrin Lasix 20 mg Synthroid 25 mcg Protonix 40 mg Pravachol 10 mg Zoloft 100 mg If she approves send them to the [facility] on [address].  Jovita Gamma would like a call back to see if Dr. Redmond Pulling was ok with prescribing these for her.  [phone].   Thank you.  3. PRESCRIBER: "Who prescribed the medicine?" Reason: if prescribed by specialist, call should be referred to that group.     Amy Fargo with [facility] was the last one to prescribe some of these medications.   Amy saw the pt. Once.      She has established with Dr. Dorna Mai since then with Primary Care at [facility].  4. SYMPTOMS: "Do you have any symptoms?" If Yes, ask: "What symptoms are you having?"  "How bad are the symptoms (e.g., mild, moderate, severe)     N/A 5. PREGNANCY:  "Is there any chance that you are pregnant?" "When was your last menstrual period?"     N/A  Protocols used: Medication Question Call-A-AH

## 2022-08-01 NOTE — Telephone Encounter (Signed)
  Chief Complaint: Daughter called in that her mother lost her medications 2 weeks ago.   Requesting new refills from Dr. Redmond Pulling.    See enclosed list. Symptoms: N/A Frequency: N/A Pertinent Negatives: Patient denies N/A Disposition: [] ED /[] Urgent Care (no appt availability in office) / [] Appointment(In office/virtual)/ []  [place] [facility]/ [] Home Care/ [] Refused Recommended Disposition /[] [place] [facility]/ [x]  Follow-up with PCP Additional Notes: List of medications sent to Dr. Redmond Pulling.   Please call Jovita Gamma, daughter at [phone] for an update.

## 2022-08-02 ENCOUNTER — Other Ambulatory Visit: Payer: Self-pay | Admitting: *Deleted

## 2022-08-02 DIAGNOSIS — E042 Nontoxic multinodular goiter: Secondary | ICD-10-CM

## 2022-08-02 DIAGNOSIS — K219 Gastro-esophageal reflux disease without esophagitis: Secondary | ICD-10-CM

## 2022-08-02 DIAGNOSIS — R600 Localized edema: Secondary | ICD-10-CM

## 2022-08-02 DIAGNOSIS — F419 Anxiety disorder, unspecified: Secondary | ICD-10-CM

## 2022-08-02 DIAGNOSIS — E7800 Pure hypercholesterolemia, unspecified: Secondary | ICD-10-CM

## 2022-08-02 MED ORDER — PANTOPRAZOLE SODIUM 40 MG PO TBEC: 40.0000 mg | DELAYED_RELEASE_TABLET | Freq: Every day | ORAL | 1 refills | Status: DC

## 2022-08-02 MED ORDER — SERTRALINE HCL 100 MG PO TABS: 100.0000 mg | ORAL_TABLET | Freq: Every day | ORAL | 1 refills | Status: DC

## 2022-08-02 MED ORDER — LEVOTHYROXINE SODIUM 25 MCG PO TABS: 25.0000 ug | ORAL_TABLET | Freq: Every day | ORAL | 0 refills | Status: DC

## 2022-08-02 MED ORDER — FUROSEMIDE 20 MG PO TABS: 20.0000 mg | ORAL_TABLET | Freq: Every day | ORAL | 1 refills | Status: DC

## 2022-08-02 MED ORDER — PRAVASTATIN SODIUM 10 MG PO TABS: 10.0000 mg | ORAL_TABLET | Freq: Every day | ORAL | 1 refills | Status: DC

## 2022-08-02 NOTE — Telephone Encounter (Signed)
Medication has been [facility] to [facility]

## 2022-08-03 DIAGNOSIS — Z1152 Encounter for screening for COVID-19: Secondary | ICD-10-CM | POA: Diagnosis not present

## 2022-08-03 DIAGNOSIS — Z20822 Contact with and (suspected) exposure to covid-19: Secondary | ICD-10-CM | POA: Diagnosis not present

## 2022-08-14 ENCOUNTER — Other Ambulatory Visit: Payer: Self-pay | Admitting: Family Medicine

## 2022-08-15 ENCOUNTER — Encounter (INDEPENDENT_AMBULATORY_CARE_PROVIDER_SITE_OTHER): Payer: Medicare Other | Admitting: Ophthalmology

## 2022-08-16 DIAGNOSIS — F331 Major depressive disorder, recurrent, moderate: Secondary | ICD-10-CM | POA: Diagnosis not present

## 2022-08-16 DIAGNOSIS — E039 Hypothyroidism, unspecified: Secondary | ICD-10-CM | POA: Diagnosis not present

## 2022-08-16 DIAGNOSIS — F4312 Post-traumatic stress disorder, chronic: Secondary | ICD-10-CM | POA: Diagnosis not present

## 2022-08-16 DIAGNOSIS — F411 Generalized anxiety disorder: Secondary | ICD-10-CM | POA: Diagnosis not present

## 2022-08-16 DIAGNOSIS — F909 Attention-deficit hyperactivity disorder, unspecified type: Secondary | ICD-10-CM | POA: Diagnosis not present

## 2022-08-23 NOTE — Progress Notes (Signed)
[facility] - Clinic Note  08/29/2022    CHIEF COMPLAINT Patient presents for Retina Follow Up   HISTORY OF PRESENT ILLNESS: Jenny Cardenas is a 72 y.o. female who presents to the clinic today for:   HPI     Retina Follow Up   Patient presents with  Dry AMD.  In both eyes.  This started 8 months ago.  I, the attending physician,  performed the HPI with the patient and updated documentation appropriately.        Comments   Patient here for 8 months retina follow up for non exu ARMD OU. Patient states vision good till end of day. Gets blurry with glasses on. OD is sore to touch at the top lid area.  Uses Restasis QD OU.       Last edited by Rennis Chris, MD on 08/31/2022  4:28 PM.    Pt states she is seeing Dr. Si Gaul for lung cancer, she had the top right lobe removed, she states she has pain in her right eye   Referring physician: Georganna Skeans, MD [address] [place],  [place] [address]  HISTORICAL INFORMATION:   Selected notes from the MEDICAL RECORD NUMBER Referred by Dr. Conley Rolls for concern of macular degeneration OU   CURRENT MEDICATIONS: Current Outpatient Medications (Ophthalmic Drugs)  Medication Sig   cycloSPORINE (RESTASIS) 0.05 % ophthalmic emulsion Place 1 drop into both eyes 2 (two) times daily.   cycloSPORINE (RESTASIS) 0.05 % ophthalmic emulsion Place 1 drop into both eyes 2 (two) times daily. (Patient not taking: Reported on 05/18/2022)   No current facility-administered medications for this visit. (Ophthalmic Drugs)   Current Outpatient Medications (Other)  Medication Sig   albuterol (PROVENTIL) (2.5 MG/3ML) 0.083% nebulizer solution Take 3 mLs (2.5 mg total) by nebulization every 4 (four) hours as needed for wheezing or shortness of breath.   albuterol (VENTOLIN HFA) 108 (90 Base) MCG/ACT inhaler Inhale 1-2 puffs into the lungs every 6 (six) hours as needed for wheezing or shortness of breath. This is a one-time  refill from me until the patient establish care with her primary care physician.  Please do not send request for refill to me in the future.   buPROPion (WELLBUTRIN XL) 150 MG 24 hr tablet Take 1 tablet (150 mg total) by mouth daily.   diphenhydramine-acetaminophen (TYLENOL PM) 25-500 MG TABS tablet Take 2 tablets by mouth at bedtime as needed.   fluconazole (DIFLUCAN) 200 MG tablet Take 1 tablet (200 mg total) by mouth daily.   furosemide (LASIX) 20 MG tablet Take 1 tablet (20 mg total) by mouth daily. May take an additional dose if needed for swelling   guaiFENesin (MUCINEX) 600 MG 12 hr tablet Take 1 tablet (600 mg total) by mouth 2 (two) times daily.   ibuprofen (ADVIL) 200 MG tablet Take 200 mg by mouth as needed.   levocetirizine (XYZAL) 5 MG tablet Take 5 mg by mouth at bedtime.   levothyroxine (SYNTHROID) 25 MCG tablet Take 1 tablet (25 mcg total) by mouth daily before breakfast.   lidocaine (LIDODERM) 5 % Place 1 patch onto the skin daily as needed (irritation). Remove & Discard patch within 12 hours or as directed by MD   pantoprazole (PROTONIX) 40 MG tablet Take 1 tablet (40 mg total) by mouth daily.   pravastatin (PRAVACHOL) 10 MG tablet Take 1 tablet (10 mg total) by mouth daily.   PRESCRIPTION MEDICATION Natrol memory complex daily  Probiotic Product (PROBIOTIC DAILY PO) Take by mouth daily.   RSV vaccine recomb adjuvanted (AREXVY) 120 MCG/0.5ML injection Inject into the muscle.   sertraline (ZOLOFT) 100 MG tablet Take 1 tablet (100 mg total) by mouth daily.   traMADol (ULTRAM) 50 MG tablet Take 1 tablet (50 mg total) by mouth every 6 (six) hours as needed (mild pain).   ALPRAZolam (XANAX) 0.5 MG tablet Take 1 tablet (0.5 mg total) by mouth 3 (three) times daily as needed for anxiety. (Patient not taking: Reported on 05/18/2022)   Multiple Vitamins-Minerals (PRESERVISION AREDS 2 PO) Take 1 capsule by mouth in the morning and at bedtime. (Patient not taking: Reported on 05/18/2022)    phenol (CHLORASEPTIC) 1.4 % LIQD Use as directed 1 spray in the mouth or throat as needed for throat irritation / pain. (Patient not taking: Reported on 05/18/2022)   No current facility-administered medications for this visit. (Other)   REVIEW OF SYSTEMS: ROS   Positive for: Gastrointestinal, Musculoskeletal, Endocrine, Eyes, Psychiatric Negative for: Constitutional, Neurological, Skin, Genitourinary, HENT, Cardiovascular, Respiratory, Allergic/Imm, Heme/Lymph Last edited by Laddie Aquas, COA on 08/29/2022  1:49 PM.     ALLERGIES Allergies  Allergen Reactions   Ethylene Oxide     Other reaction(s): scarred cornea   Other     Etholine Oxide: causes itching and hives, and temporary blindness   PAST MEDICAL HISTORY Past Medical History:  Diagnosis Date   ADHD (attention deficit hyperactivity disorder) 2020   ADD   Anxiety    Arthritis    Broken heart syndrome 2013   Carpal tunnel syndrome    Cataract    Chronic kidney disease    Depression    Diabetes mellitus without complication (HCC)    Type 2   Dysrhythmia 1976   tachycardia   Fall 04/10/2015   GERD (gastroesophageal reflux disease)    Goiter    History of shingles    On abdomen, left ring finger, and left leg; Pt takes Valtrex   Hyperlipidemia    Hypertension    Hypothyroidism    lung ca 04/2021   Obesity    PTSD (post-traumatic stress disorder)    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   Shortness of breath dyspnea    pt stated its related to the back problems she has   Thyroid disease    Ulcer    Past Surgical History:  Procedure Laterality Date   ABDOMINAL HYSTERECTOMY  1996   BRONCHIAL BRUSHINGS  06/29/2021   Procedure: BRONCHIAL BRUSHINGS;  Surgeon: Leslye Peer, MD;  Location: [facility] ENDOSCOPY;  Service: Pulmonary;;   BRONCHIAL NEEDLE ASPIRATION BIOPSY  06/29/2021   Procedure: BRONCHIAL NEEDLE ASPIRATION BIOPSIES;  Surgeon: Leslye Peer, MD;  Location: [facility] ENDOSCOPY;  Service: Pulmonary;;   CATARACT  EXTRACTION, BILATERAL     CESAREAN SECTION  1976, 1983, 1984   COLONOSCOPY W/ POLYPECTOMY     EYE SURGERY Bilateral 2014   cataratact and lasik surgery   FIDUCIAL MARKER PLACEMENT  06/29/2021   Procedure: FIDUCIAL DYE MARKING;  Surgeon: Leslye Peer, MD;  Location: [facility] ENDOSCOPY;  Service: Pulmonary;;   INTERCOSTAL NERVE BLOCK  06/29/2021   Procedure: INTERCOSTAL NERVE BLOCK;  Surgeon: Corliss Skains, MD;  Location: [facility] OR;  Service: Thoracic;;   KYPHOPLASTY N/A 06/02/2015   Procedure: KYPHOPLASTY;  Surgeon: Estill Bamberg, MD;  Location: [facility] OR;  Service: Orthopedics;  Laterality: N/A;  Thoracic 3, 8, 10 kyphoplasty   LOBECTOMY  06/29/2021   Procedure: RIGHT UPPER  LOBECTOMY;  Surgeon: Corliss Skains, MD;  Location[facility] OR;  Service: Thoracic;;   LYMPH NODE DISSECTION  06/29/2021   Procedure: LYMPH NODE DISSECTION;  Surgeon: Corliss Skains, MD;  Location: [facility] OR;  Service: Thoracic;;   FAMILY HISTORY Family History  Problem Relation Age of Onset   COPD Mother    Depression Mother    Hypertension Mother    Hyperlipidemia Mother    Breast cancer Mother 33   Diverticulosis Mother    Heart disease Father    Dementia Father    Breast cancer Sister 70   Non-Hodgkin's lymphoma Sister    Colon cancer Neg Hx    SOCIAL HISTORY Social History   Tobacco Use   Smoking status: Former    Packs/day: 0.50    Years: 4.00    Total pack years: 2.00    Types: Cigarettes    Quit date: 06/28/2016    Years since quitting: 6.1   Smokeless tobacco: Never  Vaping Use   Vaping Use: Never used  Substance Use Topics   Alcohol use: Not Currently    Comment: rare glass of wine (holidays only)   Drug use: Never       OPHTHALMIC EXAM: Base Eye Exam     Visual Acuity (Snellen - Linear)       Right Left   Dist cc 20/30 -2 20/30   Dist ph cc NI NI    Correction: Glasses         Tonometry (Tonopen, 1:45 PM)       Right Left   Pressure 15 14         Pupils        Dark Light Shape React APD   Right 3 2 Round Brisk None   Left 3 2 Round Brisk None         Visual Fields (Counting fingers)       Left Right    Full Full         Extraocular Movement       Right Left    Full, Ortho Full, Ortho         Neuro/Psych     Oriented x3: Yes   Mood/Affect: Normal         Dilation     Both eyes: 1.0% Mydriacyl, 2.5% Phenylephrine @ 1:45 PM           Slit Lamp and Fundus Exam     Slit Lamp Exam       Right Left   Lids/Lashes Mild Dermatochalasis - upper lid Mild Dermatochalasis - upper lid, Meibomian gland dysfunction   Conjunctiva/Sclera White and quiet prominent epi scleral vessels temporally   Cornea Mild arcus, trace Punctate epithelial erosions, well healed cataract wounds, superior / inferior LRIs, barely visible lasik flap, trace tear film debris arcus, 1+Punctate epithelial erosions, well healed cataract wounds, superior / inferior LRIs, well healed lasik flap, mild corneal haze / Salzmann's nodule at 0400, trace tear film debris   Anterior Chamber deep and clear deep and clear   Iris Round and dilated Round and dilated   Lens PC IOL in good position with open PC PC IOL in good position with open PC   Anterior Vitreous Vitreous syneresis, Posterior vitreous detachment, mild vitreous condensations Vitreous syneresis, Posterior vitreous detachment, vitreous condensations         Fundus Exam       Right Left   Disc mild Pallor, Sharp rim, Compact trace Pallor, Sharp rim  C/D Ratio 0.2 0.3   Macula Flat, Good foveal reflex, rare Drusen, mild RPE mottling, No heme or edema Flat, Good foveal reflex, +Drusen, mild RPE mottling, No heme or edema   Vessels mild attenuation mild attenuation   Periphery Attached, mild reticular degeneration, No heme Attached, mild reticular degeneration, No heme           Refraction     Wearing Rx       Sphere Cylinder Axis Add   Right +0.50 +0.75 085 +3.75   Left [place] +0.75 085 +3.75            IMAGING AND PROCEDURES  Imaging and Procedures for 08/29/2022  OCT, Retina - OU - Both Eyes       Right Eye Quality was good. Central Foveal Thickness: 286. Progression has been stable. Findings include normal foveal contour, no IRF, no SRF, retinal drusen (Rare drusen nasal macula / peripapillary).   Left Eye Quality was good. Central Foveal Thickness: 286. Progression has been stable. Findings include normal foveal contour, no IRF, no SRF, retinal drusen (Rare, focal drusen).   Notes *Images captured and stored on drive  Diagnosis / Impression:  NFP, no IRF/SRF OU rare focal drusen / early non-exu ARMD OU  Clinical management:  See below  Abbreviations: NFP - Normal foveal profile. CME - cystoid macular edema. PED - pigment epithelial detachment. IRF - intraretinal fluid. SRF - subretinal fluid. EZ - ellipsoid zone. ERM - epiretinal membrane. ORA - outer retinal atrophy. ORT - outer retinal tubulation. SRHM - subretinal hyper-reflective material. IRHM - intraretinal hyper-reflective material            ASSESSMENT/PLAN:   ICD-10-CM   1. Early dry stage nonexudative age-related macular degeneration of both eyes  H35.3131 OCT, Retina - OU - Both Eyes    2. Diabetes mellitus type 2 without retinopathy (HCC)  E11.9     3. Pseudophakia, both eyes  Z96.1     4. S/P LASIK (laser assisted in situ keratomileusis) of both eyes  Z98.890     5. Dry eyes  H04.123      1. Age related macular degeneration, non-exudative, OU  - exam and OCT with rare focal drusen OU -- early stage, stable  - The incidence, anatomy, and pathology of dry AMD, risk of progression, and the AREDS and AREDS 2 studies including smoking risks discussed with patient.  - Recommend amsler grid monitoring  - f/u 9 months, sooner prn -- DFE, OCT  2. Diabetes mellitus, type 2 without retinopathy - The incidence, risk factors for progression, natural history and treatment options for diabetic  retinopathy  were discussed with patient.   - The need for close monitoring of blood glucose, blood pressure, and serum lipids, avoiding cigarette or any type of tobacco, and the need for long term follow up was also discussed with patient. - f/u in 1 year, sooner prn  3. Pseudophakia OU  - s/p CE/IOL OU (Dr. Delaney Meigs)  - IOLs in good position, doing well  - monitor  4. History of Lasik  - stable  - mild corneal scarring OS  5. Dry eyes OU  - recommend artificial tears and lubricating ointment as needed  Ophthalmic Meds Ordered this visit:  No orders of the defined types were placed in this encounter.    Return in about 9 months (around 05/30/2023) for f/u non-exu ARMD OU, DFE, OCT.  There are no Patient Instructions on file for this visit.  Explained the diagnoses,  plan, and follow up with the patient and they expressed understanding.  Patient expressed understanding of the importance of proper follow up care.   This document serves as a record of services personally performed by Karie Chimera, MD, PhD. It was created on their behalf by De Blanch, an ophthalmic technician. The creation of this record is the provider's dictation and/or activities during the visit.    Electronically signed by: De Blanch, OA, 08/31/22  4:29 PM  This document serves as a record of services personally performed by Karie Chimera, MD, PhD. It was created on their behalf by Glee Arvin. Manson Passey, OA an ophthalmic technician. The creation of this record is the provider's dictation and/or activities during the visit.    Electronically signed by: Glee Arvin. Manson Passey, [place] 01.17.2024 4:29 PM  Karie Chimera, M.D., Ph.D. Diseases & Surgery of the Retina and Vitreous [facility]  I have reviewed the above documentation for accuracy and completeness, and I agree with the above. Karie Chimera, M.D., Ph.D. 08/31/22 4:30 PM   Abbreviations: M myopia (nearsighted); A  astigmatism; H hyperopia (farsighted); P presbyopia; Mrx spectacle prescription;  CTL contact lenses; OD right eye; OS left eye; OU both eyes  XT exotropia; ET esotropia; PEK punctate epithelial keratitis; PEE punctate epithelial erosions; DES dry eye syndrome; MGD meibomian gland dysfunction; ATs artificial tears; PFAT's preservative free artificial tears; NSC nuclear sclerotic cataract; PSC posterior subcapsular cataract; ERM epi-retinal membrane; PVD posterior vitreous detachment; RD retinal detachment; DM diabetes mellitus; DR diabetic retinopathy; NPDR non-proliferative diabetic retinopathy; PDR proliferative diabetic retinopathy; CSME clinically significant macular edema; DME diabetic macular edema; dbh dot blot hemorrhages; CWS cotton wool spot; POAG primary open angle glaucoma; C/D cup-to-disc ratio; HVF humphrey visual field; GVF goldmann visual field; OCT optical coherence tomography; IOP intraocular pressure; BRVO Branch retinal vein occlusion; CRVO central retinal vein occlusion; CRAO central retinal artery occlusion; BRAO branch retinal artery occlusion; RT retinal tear; SB scleral buckle; PPV pars plana vitrectomy; VH Vitreous hemorrhage; PRP panretinal laser photocoagulation; IVK intravitreal kenalog; VMT vitreomacular traction; MH Macular hole;  NVD neovascularization of the disc; NVE neovascularization elsewhere; AREDS age related eye disease study; ARMD age related macular degeneration; POAG primary open angle glaucoma; EBMD epithelial/anterior basement membrane dystrophy; ACIOL anterior chamber intraocular lens; IOL intraocular lens; PCIOL posterior chamber intraocular lens; Phaco/IOL phacoemulsification with intraocular lens placement; PRK photorefractive keratectomy; LASIK laser assisted in situ keratomileusis; HTN hypertension; DM diabetes mellitus; COPD chronic obstructive pulmonary disease

## 2022-08-29 ENCOUNTER — Ambulatory Visit (INDEPENDENT_AMBULATORY_CARE_PROVIDER_SITE_OTHER): Payer: Medicare Other | Admitting: Ophthalmology

## 2022-08-29 ENCOUNTER — Encounter (INDEPENDENT_AMBULATORY_CARE_PROVIDER_SITE_OTHER): Payer: Self-pay | Admitting: Ophthalmology

## 2022-08-29 DIAGNOSIS — H353131 Nonexudative age-related macular degeneration, bilateral, early dry stage: Secondary | ICD-10-CM

## 2022-08-29 DIAGNOSIS — H04123 Dry eye syndrome of bilateral lacrimal glands: Secondary | ICD-10-CM

## 2022-08-29 DIAGNOSIS — Z9889 Other specified postprocedural states: Secondary | ICD-10-CM

## 2022-08-29 DIAGNOSIS — E119 Type 2 diabetes mellitus without complications: Secondary | ICD-10-CM

## 2022-08-29 DIAGNOSIS — Z961 Presence of intraocular lens: Secondary | ICD-10-CM

## 2022-08-31 ENCOUNTER — Encounter (INDEPENDENT_AMBULATORY_CARE_PROVIDER_SITE_OTHER): Payer: Self-pay | Admitting: Ophthalmology

## 2022-09-19 ENCOUNTER — Ambulatory Visit
Admission: RE | Admit: 2022-09-19 | Discharge: 2022-09-19 | Disposition: A | Discharge status: Home / Self Care | Payer: Medicare Other | Source: Ambulatory Visit | Attending: Orthopedic Surgery | Admitting: Orthopedic Surgery

## 2022-09-19 DIAGNOSIS — M810 Age-related osteoporosis without current pathological fracture: Secondary | ICD-10-CM | POA: Diagnosis not present

## 2022-09-19 DIAGNOSIS — E2839 Other primary ovarian failure: Secondary | ICD-10-CM

## 2022-09-19 DIAGNOSIS — Z780 Asymptomatic menopausal state: Secondary | ICD-10-CM | POA: Diagnosis not present

## 2022-09-19 DIAGNOSIS — M85852 Other specified disorders of bone density and structure, left thigh: Secondary | ICD-10-CM | POA: Diagnosis not present

## 2022-09-21 ENCOUNTER — Telehealth: Payer: Self-pay | Admitting: Family Medicine

## 2022-09-21 ENCOUNTER — Ambulatory Visit: Payer: Self-pay

## 2022-09-21 NOTE — Telephone Encounter (Signed)
  Chief Complaint: confusion  Symptoms: confusion and forgetfulness, c/o pain but not specific to location Frequency: 1 week  Pertinent Negatives: NA Disposition: [] ED /[x] Urgent Care (no appt availability in office) / [x] Appointment(In office/virtual)/ []  [facility]/ [] Home Care/ [] Refused Recommended Disposition /[] [facility]/ []  Follow-up with PCP Additional Notes: spoke with both pt's daughters, they are concerned for possible UTI d/t confusion getting worse in the past week. Pt also has forgetfulness that gets worse with sundowning. No appts until appt scheduled for 10/03/22, recommended UC to r/o UTI. Daughters agreed and scheduled for tomorrow at 1000. Will keep appt with Dr. Redmond Pulling to discuss possible dementia and go from there. Advised to call back if sx get worse prior to OV. Daughters verbalized understanding.   Summary: possible UTI   Pts daughter thinks her mom may have a UTI / dues to the cognitive issues she is having / please advise         Reason for Disposition  [1] Worsening confusion AND [2] gradual onset (days to weeks)  Answer Assessment - Initial Assessment Questions 1. MAIN CONCERN OR SYMPTOM:  "What is your main concern right now?" "What questions do you have?" "What's the main symptom you're worried about?" (e.g., confusion, memory loss)     Confusion and forgetfulness getting worse 2. ONSET:  "When did the symptom start (or worsen)?" (minutes, hours, days, weeks)     1 week  4. DIAGNOSIS: "Was the dementia diagnosed by a doctor?" If Yes, ask: "When?" (e.g., days, months, years ago)     No diagnosis yet 5. MEDICINES: "Has there been any change in medicines recently?" (e.g., narcotics, antihistamines, benzodiazepines, etc.)     NA 6. OTHER SYMPTOMS: "Are there any other symptoms?" (e.g., fever, cough, pain, falling)     C/o pain the other day  7. SUPPORT: Document living circumstances and support (e.g., family, nursing home)     Lives with  her mother but pt's daughters are very involved and noticed a change  Protocols used: Dementia Symptoms and Questions-A-AH

## 2022-09-21 NOTE — Telephone Encounter (Signed)
Copied from [place] [id number]. Topic: General - Other >> Sep 21, 2022 11:49 AM Chapman Fitch wrote: Reason for CRM: Pt would like nurse or Dr. Redmond Pulling to call to go over bone density results / please call [facility]

## 2022-09-22 ENCOUNTER — Encounter (HOSPITAL_COMMUNITY): Payer: Self-pay | Admitting: Emergency Medicine

## 2022-09-22 ENCOUNTER — Inpatient Hospital Stay (HOSPITAL_COMMUNITY)
Admission: EM | Admit: 2022-09-22 | Discharge: 2022-09-25 | DRG: 640 | Disposition: A | Discharge status: Home Health | Payer: Medicare Other | Attending: Internal Medicine | Admitting: Internal Medicine

## 2022-09-22 ENCOUNTER — Emergency Department (HOSPITAL_COMMUNITY): Payer: Medicare Other

## 2022-09-22 ENCOUNTER — Other Ambulatory Visit: Payer: Self-pay

## 2022-09-22 ENCOUNTER — Inpatient Hospital Stay (HOSPITAL_COMMUNITY): Payer: Medicare Other

## 2022-09-22 ENCOUNTER — Inpatient Hospital Stay
Admission: RE | Admit: 2022-09-22 | Discharge: 2022-09-22 | Disposition: A | Discharge status: Home / Self Care | Payer: Self-pay | Source: Ambulatory Visit

## 2022-09-22 ENCOUNTER — Ambulatory Visit
Admission: EM | Admit: 2022-09-22 | Discharge: 2022-09-22 | Disposition: A | Discharge status: Home / Self Care | Payer: Medicare Other

## 2022-09-22 DIAGNOSIS — Z85118 Personal history of other malignant neoplasm of bronchus and lung: Secondary | ICD-10-CM | POA: Diagnosis not present

## 2022-09-22 DIAGNOSIS — Z807 Family history of other malignant neoplasms of lymphoid, hematopoietic and related tissues: Secondary | ICD-10-CM

## 2022-09-22 DIAGNOSIS — G9341 Metabolic encephalopathy: Secondary | ICD-10-CM | POA: Diagnosis present

## 2022-09-22 DIAGNOSIS — Z888 Allergy status to other drugs, medicaments and biological substances status: Secondary | ICD-10-CM

## 2022-09-22 DIAGNOSIS — Z79890 Hormone replacement therapy (postmenopausal): Secondary | ICD-10-CM

## 2022-09-22 DIAGNOSIS — R9431 Abnormal electrocardiogram [ECG] [EKG]: Secondary | ICD-10-CM | POA: Diagnosis present

## 2022-09-22 DIAGNOSIS — R4182 Altered mental status, unspecified: Secondary | ICD-10-CM | POA: Diagnosis not present

## 2022-09-22 DIAGNOSIS — R296 Repeated falls: Secondary | ICD-10-CM | POA: Diagnosis present

## 2022-09-22 DIAGNOSIS — Z8619 Personal history of other infectious and parasitic diseases: Secondary | ICD-10-CM | POA: Diagnosis not present

## 2022-09-22 DIAGNOSIS — R600 Localized edema: Secondary | ICD-10-CM

## 2022-09-22 DIAGNOSIS — Z902 Acquired absence of lung [part of]: Secondary | ICD-10-CM

## 2022-09-22 DIAGNOSIS — E860 Dehydration: Secondary | ICD-10-CM | POA: Diagnosis present

## 2022-09-22 DIAGNOSIS — W19XXXA Unspecified fall, initial encounter: Secondary | ICD-10-CM | POA: Diagnosis present

## 2022-09-22 DIAGNOSIS — N3000 Acute cystitis without hematuria: Secondary | ICD-10-CM | POA: Diagnosis not present

## 2022-09-22 DIAGNOSIS — N1831 Chronic kidney disease, stage 3a: Secondary | ICD-10-CM | POA: Diagnosis present

## 2022-09-22 DIAGNOSIS — Z825 Family history of asthma and other chronic lower respiratory diseases: Secondary | ICD-10-CM

## 2022-09-22 DIAGNOSIS — Z8249 Family history of ischemic heart disease and other diseases of the circulatory system: Secondary | ICD-10-CM

## 2022-09-22 DIAGNOSIS — Z90710 Acquired absence of both cervix and uterus: Secondary | ICD-10-CM

## 2022-09-22 DIAGNOSIS — N390 Urinary tract infection, site not specified: Secondary | ICD-10-CM | POA: Diagnosis not present

## 2022-09-22 DIAGNOSIS — E039 Hypothyroidism, unspecified: Secondary | ICD-10-CM | POA: Diagnosis present

## 2022-09-22 DIAGNOSIS — E0590 Thyrotoxicosis, unspecified without thyrotoxic crisis or storm: Secondary | ICD-10-CM | POA: Diagnosis not present

## 2022-09-22 DIAGNOSIS — F419 Anxiety disorder, unspecified: Secondary | ICD-10-CM

## 2022-09-22 DIAGNOSIS — E785 Hyperlipidemia, unspecified: Secondary | ICD-10-CM | POA: Diagnosis not present

## 2022-09-22 DIAGNOSIS — I6782 Cerebral ischemia: Secondary | ICD-10-CM | POA: Diagnosis not present

## 2022-09-22 DIAGNOSIS — F32A Depression, unspecified: Secondary | ICD-10-CM | POA: Diagnosis present

## 2022-09-22 DIAGNOSIS — R410 Disorientation, unspecified: Secondary | ICD-10-CM | POA: Diagnosis not present

## 2022-09-22 DIAGNOSIS — I10 Essential (primary) hypertension: Secondary | ICD-10-CM | POA: Diagnosis present

## 2022-09-22 DIAGNOSIS — Z87891 Personal history of nicotine dependence: Secondary | ICD-10-CM

## 2022-09-22 DIAGNOSIS — Z83438 Family history of other disorder of lipoprotein metabolism and other lipidemia: Secondary | ICD-10-CM

## 2022-09-22 DIAGNOSIS — Z79899 Other long term (current) drug therapy: Secondary | ICD-10-CM

## 2022-09-22 DIAGNOSIS — E876 Hypokalemia: Secondary | ICD-10-CM | POA: Diagnosis present

## 2022-09-22 DIAGNOSIS — E1122 Type 2 diabetes mellitus with diabetic chronic kidney disease: Secondary | ICD-10-CM | POA: Diagnosis present

## 2022-09-22 DIAGNOSIS — I129 Hypertensive chronic kidney disease with stage 1 through stage 4 chronic kidney disease, or unspecified chronic kidney disease: Secondary | ICD-10-CM | POA: Diagnosis not present

## 2022-09-22 DIAGNOSIS — Z803 Family history of malignant neoplasm of breast: Secondary | ICD-10-CM

## 2022-09-22 DIAGNOSIS — R531 Weakness: Secondary | ICD-10-CM

## 2022-09-22 DIAGNOSIS — T501X5A Adverse effect of loop [high-ceiling] diuretics, initial encounter: Secondary | ICD-10-CM | POA: Diagnosis present

## 2022-09-22 DIAGNOSIS — E873 Alkalosis: Secondary | ICD-10-CM | POA: Diagnosis not present

## 2022-09-22 DIAGNOSIS — R200 Anesthesia of skin: Secondary | ICD-10-CM | POA: Diagnosis not present

## 2022-09-22 DIAGNOSIS — Z818 Family history of other mental and behavioral disorders: Secondary | ICD-10-CM

## 2022-09-22 DIAGNOSIS — M1990 Unspecified osteoarthritis, unspecified site: Secondary | ICD-10-CM | POA: Diagnosis present

## 2022-09-22 DIAGNOSIS — Z043 Encounter for examination and observation following other accident: Secondary | ICD-10-CM | POA: Diagnosis not present

## 2022-09-22 DIAGNOSIS — M810 Age-related osteoporosis without current pathological fracture: Secondary | ICD-10-CM | POA: Diagnosis present

## 2022-09-22 LAB — CBC WITH DIFFERENTIAL/PLATELET
Abs Immature Granulocytes: 0.02 10*3/uL (ref 0.00–0.07)
Basophils Absolute: 0 10*3/uL (ref 0.0–0.1)
Basophils Relative: 0 %
Eosinophils Absolute: 0.1 10*3/uL (ref 0.0–0.5)
Eosinophils Relative: 1 %
HCT: 44.1 % (ref 36.0–46.0)
Hemoglobin: 14.6 g/dL (ref 12.0–15.0)
Immature Granulocytes: 0 %
Lymphocytes Relative: 26 %
Lymphs Abs: 2.4 10*3/uL (ref 0.7–4.0)
MCH: 27.4 pg (ref 26.0–34.0)
MCHC: 33.1 g/dL (ref 30.0–36.0)
MCV: 82.7 fL (ref 80.0–100.0)
Monocytes Absolute: 0.5 10*3/uL (ref 0.1–1.0)
Monocytes Relative: 5 %
Neutro Abs: 6.4 10*3/uL (ref 1.7–7.7)
Neutrophils Relative %: 68 %
Platelets: 265 10*3/uL (ref 150–400)
RBC: 5.33 MIL/uL — ABNORMAL HIGH (ref 3.87–5.11)
RDW: 13.2 % (ref 11.5–15.5)
WBC: 9.5 10*3/uL (ref 4.0–10.5)
nRBC: 0 % (ref 0.0–0.2)

## 2022-09-22 LAB — COMPREHENSIVE METABOLIC PANEL
ALT: 17 U/L (ref 0–44)
AST: 24 U/L (ref 15–41)
Albumin: 3.9 g/dL (ref 3.5–5.0)
Alkaline Phosphatase: 95 U/L (ref 38–126)
Anion gap: 15 (ref 5–15)
BUN: 9 mg/dL (ref 8–23)
CO2: 32 mmol/L (ref 22–32)
Calcium: 9.1 mg/dL (ref 8.9–10.3)
Chloride: 90 mmol/L — ABNORMAL LOW (ref 98–111)
Creatinine, Ser: 1.1 mg/dL — ABNORMAL HIGH (ref 0.44–1.00)
GFR, Estimated: 53 mL/min — ABNORMAL LOW (ref >60)
Glucose, Bld: 148 mg/dL — ABNORMAL HIGH (ref 70–99)
Potassium: 2.3 mmol/L — CL (ref 3.5–5.1)
Sodium: 137 mmol/L (ref 135–145)
Total Bilirubin: 0.8 mg/dL (ref 0.3–1.2)
Total Protein: 7.3 g/dL (ref 6.5–8.1)

## 2022-09-22 LAB — URINALYSIS, ROUTINE W REFLEX MICROSCOPIC
Bilirubin Urine: NEGATIVE
Glucose, UA: NEGATIVE mg/dL
Ketones, ur: NEGATIVE mg/dL
Nitrite: NEGATIVE
Protein, ur: NEGATIVE mg/dL
Specific Gravity, Urine: 1.006 (ref 1.005–1.030)
pH: 5 (ref 5.0–8.0)

## 2022-09-22 LAB — SALICYLATE LEVEL: Salicylate Lvl: <7 mg/dL — ABNORMAL LOW (ref 7.0–30.0)

## 2022-09-22 LAB — TSH: TSH: 1.762 u[IU]/mL (ref 0.350–4.500)

## 2022-09-22 LAB — MAGNESIUM: Magnesium: 1.8 mg/dL (ref 1.7–2.4)

## 2022-09-22 LAB — POTASSIUM: Potassium: 2.5 mmol/L — CL (ref 3.5–5.1)

## 2022-09-22 LAB — VITAMIN B12: Vitamin B-12: 264 pg/mL (ref 180–914)

## 2022-09-22 LAB — ACETAMINOPHEN LEVEL: Acetaminophen (Tylenol), Serum: 13 ug/mL (ref 10–30)

## 2022-09-22 MED ORDER — TRAMADOL HCL 50 MG PO TABS: 50.0000 mg | ORAL_TABLET | Freq: Four times a day (QID) | ORAL | Status: DC | PRN

## 2022-09-22 MED ORDER — SERTRALINE HCL 100 MG PO TABS
100.0000 mg | ORAL_TABLET | Freq: Every day | ORAL | Status: DC
  Administered 2022-09-22 – 2022-09-25 (×4): 100 mg via ORAL
  Filled 2022-09-22 (×4): qty 1

## 2022-09-22 MED ORDER — TRAZODONE HCL 50 MG PO TABS
50.0000 mg | ORAL_TABLET | Freq: Every day | ORAL | Status: DC
  Administered 2022-09-22 – 2022-09-24 (×3): 50 mg via ORAL
  Filled 2022-09-22 (×3): qty 1

## 2022-09-22 MED ORDER — BUPROPION HCL ER (XL) 150 MG PO TB24
150.0000 mg | ORAL_TABLET | Freq: Every day | ORAL | Status: DC
  Administered 2022-09-23 – 2022-09-25 (×3): 150 mg via ORAL
  Filled 2022-09-22 (×3): qty 1

## 2022-09-22 MED ORDER — POLYVINYL ALCOHOL 1.4 % OP SOLN: 1.0000 [drp] | OPHTHALMIC | Status: DC | PRN

## 2022-09-22 MED ORDER — ACETAMINOPHEN 325 MG PO TABS
650.0000 mg | ORAL_TABLET | Freq: Once | ORAL | Status: AC
  Administered 2022-09-22: 650 mg via ORAL
  Filled 2022-09-22: qty 2

## 2022-09-22 MED ORDER — SODIUM CHLORIDE 0.9% FLUSH
3.0000 mL | Freq: Two times a day (BID) | INTRAVENOUS | Status: DC
  Administered 2022-09-22 – 2022-09-25 (×6): 3 mL via INTRAVENOUS

## 2022-09-22 MED ORDER — ACETAMINOPHEN 650 MG RE SUPP: 650.0000 mg | Freq: Four times a day (QID) | RECTAL | Status: DC | PRN

## 2022-09-22 MED ORDER — ENOXAPARIN SODIUM 40 MG/0.4ML IJ SOSY
40.0000 mg | PREFILLED_SYRINGE | INTRAMUSCULAR | Status: DC
  Administered 2022-09-22 – 2022-09-24 (×3): 40 mg via SUBCUTANEOUS
  Filled 2022-09-22 (×3): qty 0.4

## 2022-09-22 MED ORDER — POTASSIUM CHLORIDE CRYS ER 20 MEQ PO TBCR
60.0000 meq | EXTENDED_RELEASE_TABLET | ORAL | Status: AC
  Administered 2022-09-22: 60 meq via ORAL
  Filled 2022-09-22: qty 3

## 2022-09-22 MED ORDER — BUTALBITAL-APAP-CAFFEINE 50-325-40 MG PO TABS
1.0000 | ORAL_TABLET | Freq: Once | ORAL | Status: AC
  Administered 2022-09-22: 1 via ORAL
  Filled 2022-09-22: qty 1

## 2022-09-22 MED ORDER — ACETAMINOPHEN 325 MG PO TABS
650.0000 mg | ORAL_TABLET | Freq: Four times a day (QID) | ORAL | Status: DC | PRN
  Administered 2022-09-22: 650 mg via ORAL
  Filled 2022-09-22: qty 2

## 2022-09-22 MED ORDER — POTASSIUM CHLORIDE 10 MEQ/100ML IV SOLN
10.0000 meq | INTRAVENOUS | Status: AC
  Administered 2022-09-22 (×2): 10 meq via INTRAVENOUS
  Filled 2022-09-22 (×3): qty 100

## 2022-09-22 MED ORDER — LEVOTHYROXINE SODIUM 25 MCG PO TABS
25.0000 ug | ORAL_TABLET | Freq: Every day | ORAL | Status: DC
  Administered 2022-09-23 – 2022-09-25 (×3): 25 ug via ORAL
  Filled 2022-09-22 (×3): qty 1

## 2022-09-22 MED ORDER — LORATADINE 10 MG PO TABS: 10.0000 mg | ORAL_TABLET | Freq: Every day | ORAL | Status: DC | PRN

## 2022-09-22 MED ORDER — ALPRAZOLAM 0.5 MG PO TABS
0.5000 mg | ORAL_TABLET | Freq: Three times a day (TID) | ORAL | Status: DC | PRN
  Administered 2022-09-22: 0.5 mg via ORAL
  Filled 2022-09-22: qty 1

## 2022-09-22 MED ORDER — DIPHENHYDRAMINE HCL 25 MG PO CAPS: 50.0000 mg | ORAL_CAPSULE | Freq: Every evening | ORAL | Status: DC | PRN

## 2022-09-22 MED ORDER — SODIUM CHLORIDE 0.9 % IV SOLN
1.0000 g | INTRAVENOUS | Status: DC
  Administered 2022-09-22 – 2022-09-23 (×2): 1 g via INTRAVENOUS
  Filled 2022-09-22 (×2): qty 10

## 2022-09-22 MED ORDER — LEVOCETIRIZINE DIHYDROCHLORIDE 5 MG PO TABS: 5.0000 mg | ORAL_TABLET | Freq: Every day | ORAL | Status: DC | PRN

## 2022-09-22 MED ORDER — PRAVASTATIN SODIUM 10 MG PO TABS
10.0000 mg | ORAL_TABLET | Freq: Every day | ORAL | Status: DC
  Administered 2022-09-22 – 2022-09-24 (×3): 10 mg via ORAL
  Filled 2022-09-22 (×3): qty 1

## 2022-09-22 MED ORDER — ALBUTEROL SULFATE (2.5 MG/3ML) 0.083% IN NEBU: 2.5000 mg | INHALATION_SOLUTION | Freq: Four times a day (QID) | RESPIRATORY_TRACT | Status: DC | PRN

## 2022-09-22 MED ORDER — BUTALBITAL-APAP-CAFFEINE 50-325-40 MG PO TABS
1.0000 | ORAL_TABLET | Freq: Four times a day (QID) | ORAL | Status: DC | PRN
  Administered 2022-09-23 – 2022-09-24 (×4): 1 via ORAL
  Filled 2022-09-22 (×3): qty 1

## 2022-09-22 MED ORDER — LORAZEPAM 2 MG/ML IJ SOLN: 0.5000 mg | Freq: Once | INTRAMUSCULAR | Status: DC | PRN

## 2022-09-22 MED ORDER — ACETAMINOPHEN 500 MG PO TABS: 1000.0000 mg | ORAL_TABLET | Freq: Every evening | ORAL | Status: DC | PRN

## 2022-09-22 MED ORDER — POTASSIUM CHLORIDE CRYS ER 20 MEQ PO TBCR
40.0000 meq | EXTENDED_RELEASE_TABLET | Freq: Once | ORAL | Status: AC
  Administered 2022-09-22: 40 meq via ORAL
  Filled 2022-09-22: qty 2

## 2022-09-22 MED ORDER — DIPHENHYDRAMINE-APAP (SLEEP) 25-500 MG PO TABS: 2.0000 | ORAL_TABLET | Freq: Every evening | ORAL | Status: DC | PRN

## 2022-09-22 MED ORDER — ALBUTEROL SULFATE HFA 108 (90 BASE) MCG/ACT IN AERS: 1.0000 | INHALATION_SPRAY | Freq: Four times a day (QID) | RESPIRATORY_TRACT | Status: DC | PRN

## 2022-09-22 NOTE — Plan of Care (Signed)
  Problem: Health Behavior/Discharge Planning: Goal: Ability to manage health-related needs will improve Outcome: Progressing   Problem: Nutrition: Goal: Adequate nutrition will be maintained Outcome: Progressing   Problem: Coping: Goal: Level of anxiety will decrease Outcome: Progressing   

## 2022-09-22 NOTE — ED Notes (Signed)
Critical potassium 2.3.  PA [place] notified and aware.  No new orders at this time.

## 2022-09-22 NOTE — ED Provider Notes (Signed)
[facility] Provider Note   CSN: [id number] Arrival date & time: 09/22/22  1113     History  Chief Complaint  Patient presents with   Altered Mental Status    Jenny Cardenas is a 72 y.o. female.  Patient was sent to the emergency department from urgent care after she presented for confusion weakness fall and possible UTI.  Patient is here with her daughter who provides history patient fell out of bed yesterday she has been complaining of some pain in her hip since the fall.  Patient has been able to ambulate without difficulty.  Patient's daughter states that for the past week patient seems to have had increasing confusion and an abnormal gait she reports patient has been appearing to be off balance.  She reports that she has noticed over several months that patient has some difficulty completing her sentences and family has to help her with communication.  Patient lives with her 65 year old mother and cares for her 21 year old mother.  She is independent she does her own care and her mother's care.  The history is provided by the patient and a relative. No language interpreter was used.  Altered Mental Status Presenting symptoms: confusion   Presenting symptoms: no behavior changes and no disorientation   Severity:  Mild Most recent episode:  Today Context: not alcohol use, not dementia, not head injury, taking medications as prescribed and not nursing home resident        Home Medications Prior to Admission medications   Medication Sig Start Date End Date Taking? Authorizing Provider  albuterol (PROVENTIL) (2.5 MG/3ML) 0.083% nebulizer solution Take 3 mLs (2.5 mg total) by nebulization every 4 (four) hours as needed for wheezing or shortness of breath. 03/29/22   Fargo, Amy E, NP  albuterol (VENTOLIN HFA) 108 (90 Base) MCG/ACT inhaler Inhale 1-2 puffs into the lungs every 6 (six) hours as needed for wheezing or shortness of breath. This  is a one-time refill from me until the patient establish care with her primary care physician.  Please do not send request for refill to me in the future. 03/22/22   Curt Bears, MD  ALPRAZolam Duanne Moron) 0.5 MG tablet Take 1 tablet (0.5 mg total) by mouth 3 (three) times daily as needed for anxiety. Patient not taking: Reported on 05/18/2022 07/14/19   Heath Lark D, DO  buPROPion (WELLBUTRIN XL) 150 MG 24 hr tablet Take 1 tablet (150 mg total) by mouth daily. 03/29/22   Fargo, Amy E, NP  cycloSPORINE (RESTASIS) 0.05 % ophthalmic emulsion Place 1 drop into both eyes 2 (two) times daily. Patient not taking: Reported on 05/18/2022    [provider]  cycloSPORINE (RESTASIS) 0.05 % ophthalmic emulsion Place 1 drop into both eyes 2 (two) times daily. 07/23/22   Dorna Mai, MD  diphenhydramine-acetaminophen (TYLENOL PM) 25-500 MG TABS tablet Take 2 tablets by mouth at bedtime as needed.    [provider]  fluconazole (DIFLUCAN) 200 MG tablet Take 1 tablet (200 mg total) by mouth daily. 07/23/22   Dorna Mai, MD  furosemide (LASIX) 20 MG tablet Take 1 tablet (20 mg total) by mouth daily. May take an additional dose if needed for swelling 08/02/22   Dorna Mai, MD  guaiFENesin (MUCINEX) 600 MG 12 hr tablet Take 1 tablet (600 mg total) by mouth 2 (two) times daily. 07/02/21   Barrett, Erin R, PA-C  ibuprofen (ADVIL) 200 MG tablet Take 200 mg by mouth as needed.  [provider]  levocetirizine (XYZAL) 5 MG tablet Take 5 mg by mouth at bedtime.    [provider]  levothyroxine (SYNTHROID) 25 MCG tablet Take 1 tablet (25 mcg total) by mouth daily before breakfast. 08/02/22   Dorna Mai, MD  lidocaine (LIDODERM) 5 % Place 1 patch onto the skin daily as needed (irritation). Remove & Discard patch within 12 hours or as directed by MD    [provider]  Multiple Vitamins-Minerals (PRESERVISION AREDS 2 PO) Take 1 capsule by mouth in the morning and at  bedtime. Patient not taking: Reported on 05/18/2022    [provider]  pantoprazole (PROTONIX) 40 MG tablet Take 1 tablet (40 mg total) by mouth daily. 08/02/22   Dorna Mai, MD  phenol (CHLORASEPTIC) 1.4 % LIQD Use as directed 1 spray in the mouth or throat as needed for throat irritation / pain. Patient not taking: Reported on 05/18/2022 07/02/21   Barrett, Lodema Hong, PA-C  pravastatin (PRAVACHOL) 10 MG tablet Take 1 tablet (10 mg total) by mouth daily. 08/02/22   Dorna Mai, MD  PRESCRIPTION MEDICATION Natrol memory complex daily    [provider]  Probiotic Product (PROBIOTIC DAILY PO) Take by mouth daily.    [provider]  RSV vaccine recomb adjuvanted (AREXVY) 120 MCG/0.5ML injection Inject into the muscle. 07/24/22   Carlyle Basques, MD  sertraline (ZOLOFT) 100 MG tablet Take 1 tablet (100 mg total) by mouth daily. 08/02/22   Dorna Mai, MD  traMADol (ULTRAM) 50 MG tablet Take 1 tablet (50 mg total) by mouth every 6 (six) hours as needed (mild pain). 03/29/22   Yvonna Alanis, NP      Allergies    Ethylene oxide and Other    Review of Systems   Review of Systems  Psychiatric/Behavioral:  Positive for confusion.   All other systems reviewed and are negative.   Physical Exam Updated Vital Signs BP 116/63   Pulse 77   Temp 98 F (36.7 C) (Oral)   Resp 16   Ht 5\' 3"  (1.6 m)   Wt 74.8 kg   SpO2 92%   BMI 29.23 kg/m  Physical Exam Vitals and nursing note reviewed.  Constitutional:      Appearance: Normal appearance. She is well-developed.  HENT:     Head: Normocephalic and atraumatic.     Right Ear: External ear normal.     Left Ear: External ear normal.     Nose: Nose normal.     Mouth/Throat:     Mouth: Mucous membranes are moist.  Eyes:     Extraocular Movements: Extraocular movements intact.     Pupils: Pupils are equal, round, and reactive to light.  Cardiovascular:     Rate and Rhythm: Normal rate.  Pulmonary:     Effort:  Pulmonary effort is normal.  Abdominal:     General: Abdomen is flat. There is no distension.     Palpations: Abdomen is soft.  Musculoskeletal:        General: Normal range of motion.     Cervical back: Normal range of motion.     Comments: Full range of motion bilateral lower extremities tender bilateral hips to palpation  Skin:    General: Skin is warm.  Neurological:     General: No focal deficit present.     Mental Status: She is alert and oriented to person, place, and time.     Comments: Patient has some difficulty finishing her sentences she looks  to her daughter to help her with her history  Psychiatric:        Mood and Affect: Mood normal.     ED Results / Procedures / Treatments   Labs (all labs ordered are listed, but only abnormal results are displayed) Labs Reviewed  CBC WITH DIFFERENTIAL/PLATELET - Abnormal; Notable for the following components:      Result Value   RBC 5.33 (*)    All other components within normal limits  COMPREHENSIVE METABOLIC PANEL - Abnormal; Notable for the following components:   Potassium 2.3 (*)    Chloride 90 (*)    Glucose, Bld 148 (*)    Creatinine, Ser 1.10 (*)    GFR, Estimated 53 (*)    All other components within normal limits  URINALYSIS, ROUTINE W REFLEX MICROSCOPIC - Abnormal; Notable for the following components:   Color, Urine STRAW (*)    Hgb urine dipstick SMALL (*)    Leukocytes,Ua SMALL (*)    Bacteria, UA MANY (*)    All other components within normal limits  MAGNESIUM    EKG EKG Interpretation  Date/Time:  Saturday September 22 2022 11:30:37 EST Ventricular Rate:  81 PR Interval:  164 QRS Duration: 140 QT Interval:  428 QTC Calculation: 497 R Axis:   251 Text Interpretation: Sinus rhythm RBBB and LAFB No acute changes No significant change since last tracing Confirmed by Varney Biles [phone]) on 09/22/2022 1:35:07 PM  Radiology CT Head Wo Contrast  Result Date: 09/22/2022 CLINICAL DATA:  Altered  mental status EXAM: CT HEAD WITHOUT CONTRAST TECHNIQUE: Contiguous axial images were obtained from the base of the skull through the vertex without intravenous contrast. RADIATION DOSE REDUCTION: This exam was performed according to the departmental dose-optimization program which includes automated exposure control, adjustment of the mA and/or kV according to patient size and/or use of iterative reconstruction technique. COMPARISON:  07/12/2019 FINDINGS: Brain: No evidence of acute infarction, hemorrhage, hydrocephalus, extra-axial collection or mass lesion/mass effect. Vascular: No hyperdense vessel or unexpected calcification. Skull: Normal. Negative for fracture or focal lesion. Sinuses/Orbits: No acute finding. Other: None. IMPRESSION: No acute intracranial abnormality. Electronically Signed   By: Davina Poke D.O.   On: 09/22/2022 13:18   DG Pelvis 1-2 Views  Result Date: 09/22/2022 CLINICAL DATA:  Fall. EXAM: PELVIS - 1 VIEW COMPARISON:  None Available. FINDINGS: There is no evidence of pelvic fracture or diastasis. No pelvic bone lesions are seen. Bilateral hip degenerative changes identified with joint space narrowing and osteophytes. Lumbosacral degenerative changes with sclerosis. IMPRESSION: Degenerative changes.  No acute osseous abnormalities. Electronically Signed   By: Sammie Bench M.D.   On: 09/22/2022 12:34   DG Chest 2 View  Result Date: 09/22/2022 CLINICAL DATA:  Altered mental status EXAM: CHEST - 2 VIEW COMPARISON:  Chest x-ray August 18, 2021 FINDINGS: The cardiomediastinal silhouette is unchanged in contour. No focal pulmonary opacity. No pleural effusion or pneumothorax. The visualized upper abdomen is unremarkable. Cement augmentation of multiple thoracic vertebral bodies. No acute osseous abnormality. IMPRESSION: No acute cardiopulmonary abnormality. Electronically Signed   By: Beryle Flock M.D.   On: 09/22/2022 12:32    Procedures Procedures    Medications  Ordered in ED Medications  potassium chloride 10 mEq in 100 mL IVPB (10 mEq Intravenous New Bag/Given 09/22/22 1345)  potassium chloride SA (KLOR-CON M) CR tablet 40 mEq (has no administration in time range)    ED Course/ Medical Decision Making/ A&P  Medical Decision Making Patient has had recent increase in confusion weakness and difficulty walking  Amount and/or Complexity of Data Reviewed Independent Historian: caregiver    Details: Is here with her daughter who provides most of her history External Data Reviewed: notes.    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  Patient had a thoracotomy.  Is followed by Dr. Redmond Pulling primary care [place] Labs: ordered. Decision-making details documented in ED Course.    Details: Labs ordered reviewed and interpreted patient has a potassium of 2.3.  UA shows many bacteria Radiology: ordered and independent interpretation performed. Decision-making details documented in ED Course.    Details: Pelvis x-ray reviewed no acute findings Chest x-ray no acute findings ET head no acute findings  ECG/medicine tests: ordered and independent interpretation performed. Decision-making details documented in ED Course.    Details: EKG reviewed Discussion of management or test interpretation with external provider(s): [facility] is consulted for admission  Risk OTC drugs. Prescription drug management. Decision regarding hospitalization. Risk Details: Patient has hypokalemia IV potassium is started patient is given 40 mEq of potassium p.o.  Patient may have a urinary tract infection.  Patient does not appear to have any signs of sepsis.  I will order a culture of her urine.           Final Clinical Impression(s) / ED Diagnoses Final diagnoses:  Hypokalemia  Confusion  Weakness    Rx / DC Orders ED Discharge Orders     None         Sidney Ace 09/22/22  [place], MD 09/22/22 1557

## 2022-09-22 NOTE — H&P (Addendum)
History and Physical    Patient: Jenny Cardenas KZS:[id number] DOB: 05-25-1951 DOA: 09/22/2022 DOS: the patient was seen and examined on 09/22/2022 PCP: Dorna Mai, MD  Patient coming from: Urgent care  Chief Complaint:  Chief Complaint  Patient presents with   Altered Mental Status   HPI: Jenny Cardenas is a 72 y.o. female with medical history significant of hypertension, hyperlipidemia, hypothyroidism, diabetes mellitus type 2, lung cancer s/p right upper lobectomy, arthritis, anxiety, and depression who presents after being noted to be acutely more altered over the last week.  History is obtained from the patient with assistance of her daughters present at bedside.  Patient lives at home and helps care for her 8 year old mother with the patient initially said was 47, but was able to correct herself.  Her daughter notes that she has been having difficulty completing sentences with increased confusion. Over the last 5 months or so family notes that she has been getting weak and had fallen approximately 5 times.  Last fall was within the last week and she almost had another fall, but a family member was able to catch her.  She reports being dizzy and lightheaded.  Patient had being planing of issues with her left hip prior to the fall with had recent bone density study done on the seventh with which records note showed osteoporosis.  Over these last 5 months or so family also reported that her mental status has been declining and her memory had been declining.  She had not started any new medications to her knowledge.  Denies having any recent fever, nausea, vomiting, diarrhea, abdominal pain, or dysuria symptoms.  Of note the patient's prior primary care provider had left the office and she did not like previous provider she was assigned to so she had been without a primary care provider for several months until here recently when she established care with Dr. Redmond Pulling about 3 months ago.  The patient  relates her memory and things is that related to her not being on Adderall which she has not been on for over a year.  In talking to the patient she has been taking furosemide 20 mg daily although it previously looks like it was prescribed as needed.  Also prior to coming into the room patient had reported having numbness of her right hand and right foot.  The emergency department patient was noted to be afebrile with stable vital signs.  Labs noted potassium 2.3, BUN 9, and creatinine 1.1.  CT scan of the brain did not note any acute abnormality.  Chest x-ray showed no acute abnormality.  X-rays of the pelvis noted degenerative changes without acute fracture.  Urinalysis positive for small hemoglobin, small leukocytes, many bacteria, and 6-10 WBCs.  Urine culture was obtained.  Patient had been ordered 40 mEq of potassium chloride p.o. and 40 mEq of potassium chloride IV.  Review of Systems: As mentioned in the history of present illness. All other systems reviewed and are negative. Past Medical History:  Diagnosis Date   ADHD (attention deficit hyperactivity disorder) 2020   ADD   Anxiety    Arthritis    Broken heart syndrome 2013   Carpal tunnel syndrome    Cataract    Chronic kidney disease    Depression    Diabetes mellitus without complication ([place])    Type 2   Dysrhythmia 1976   tachycardia   Fall 04/10/2015   GERD (gastroesophageal reflux disease)    Goiter  History of shingles    On abdomen, left ring finger, and left leg; Pt takes Valtrex   Hyperlipidemia    Hypertension    Hypothyroidism    lung ca 04/2021   Obesity    PTSD (post-traumatic stress disorder)    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   Shortness of breath dyspnea    pt stated its related to the back problems she has   Thyroid disease    Ulcer    Past Surgical History:  Procedure Laterality Date   ABDOMINAL HYSTERECTOMY  1996   BRONCHIAL BRUSHINGS  06/29/2021   Procedure: BRONCHIAL BRUSHINGS;  Surgeon:  Collene Gobble, MD;  Location: [facility] ENDOSCOPY;  Service: Pulmonary;;   BRONCHIAL NEEDLE ASPIRATION BIOPSY  06/29/2021   Procedure: BRONCHIAL NEEDLE ASPIRATION BIOPSIES;  Surgeon: Collene Gobble, MD;  Location: [facility] ENDOSCOPY;  Service: Pulmonary;;   CATARACT EXTRACTION, BILATERAL     CESAREAN SECTION  1976, 1983, 1984   COLONOSCOPY W/ POLYPECTOMY     EYE SURGERY Bilateral 2014   cataratact and lasik surgery   FIDUCIAL MARKER PLACEMENT  06/29/2021   Procedure: FIDUCIAL DYE MARKING;  Surgeon: Collene Gobble, MD;  Location: [facility] ENDOSCOPY;  Service: Pulmonary;;   INTERCOSTAL NERVE BLOCK  06/29/2021   Procedure: INTERCOSTAL NERVE BLOCK;  Surgeon: Lajuana Matte, MD;  Location: [place];  Service: Thoracic;;   KYPHOPLASTY N/A 06/02/2015   Procedure: KYPHOPLASTY;  Surgeon: Phylliss Bob, MD;  Location: [facility];  Service: Orthopedics;  Laterality: N/A;  Thoracic 3, 8, 10 kyphoplasty   LOBECTOMY  06/29/2021   Procedure: RIGHT UPPER LOBECTOMY;  Surgeon: Lajuana Matte, MD;  Location: [facility];  Service: Thoracic;;   LYMPH NODE DISSECTION  06/29/2021   Procedure: LYMPH NODE DISSECTION;  Surgeon: Lajuana Matte, MD;  Location: [facility] [facility];  Service: Thoracic;;   Social History:  reports that she quit smoking about 6 years ago. Her smoking use included cigarettes. She has a 2.00 pack-year smoking history. She has never used smokeless tobacco. She reports that she does not currently use alcohol. She reports that she does not use drugs.  Allergies  Allergen Reactions   Ethylene Oxide     Other reaction(s): scarred cornea   Other     Etholine Oxide: causes itching and hives, and temporary blindness    Family History  Problem Relation Age of Onset   COPD Mother    Depression Mother    Hypertension Mother    Hyperlipidemia Mother    Breast cancer Mother 42   Diverticulosis Mother    Heart disease Father    Dementia Father    Breast cancer Sister 9   Non-Hodgkin's lymphoma Sister    Colon  cancer Neg Hx     Prior to Admission medications   Medication Sig Start Date End Date Taking? Authorizing Provider  albuterol (PROVENTIL) (2.5 MG/3ML) 0.083% nebulizer solution Take 3 mLs (2.5 mg total) by nebulization every 4 (four) hours as needed for wheezing or shortness of breath. 03/29/22   Fargo, Amy E, NP  albuterol (VENTOLIN HFA) 108 (90 Base) MCG/ACT inhaler Inhale 1-2 puffs into the lungs every 6 (six) hours as needed for wheezing or shortness of breath. This is a one-time refill from me until the patient establish care with her primary care physician.  Please do not send request for refill to me in the future. 03/22/22   Curt Bears, MD  ALPRAZolam Duanne Moron) 0.5 MG tablet Take 1 tablet (0.5 mg total) by  mouth 3 (three) times daily as needed for anxiety. Patient not taking: Reported on 05/18/2022 07/14/19   Heath Lark D, DO  buPROPion (WELLBUTRIN XL) 150 MG 24 hr tablet Take 1 tablet (150 mg total) by mouth daily. 03/29/22   Fargo, Amy E, NP  cycloSPORINE (RESTASIS) 0.05 % ophthalmic emulsion Place 1 drop into both eyes 2 (two) times daily. Patient not taking: Reported on 05/18/2022    [provider]  cycloSPORINE (RESTASIS) 0.05 % ophthalmic emulsion Place 1 drop into both eyes 2 (two) times daily. 07/23/22   Dorna Mai, MD  diphenhydramine-acetaminophen (TYLENOL PM) 25-500 MG TABS tablet Take 2 tablets by mouth at bedtime as needed.    [provider]  fluconazole (DIFLUCAN) 200 MG tablet Take 1 tablet (200 mg total) by mouth daily. 07/23/22   Dorna Mai, MD  furosemide (LASIX) 20 MG tablet Take 1 tablet (20 mg total) by mouth daily. May take an additional dose if needed for swelling 08/02/22   Dorna Mai, MD  guaiFENesin (MUCINEX) 600 MG 12 hr tablet Take 1 tablet (600 mg total) by mouth 2 (two) times daily. 07/02/21   Barrett, Erin R, PA-C  ibuprofen (ADVIL) 200 MG tablet Take 200 mg by mouth as needed.    [provider]  levocetirizine (XYZAL)  5 MG tablet Take 5 mg by mouth at bedtime.    [provider]  levothyroxine (SYNTHROID) 25 MCG tablet Take 1 tablet (25 mcg total) by mouth daily before breakfast. 08/02/22   Dorna Mai, MD  lidocaine (LIDODERM) 5 % Place 1 patch onto the skin daily as needed (irritation). Remove & Discard patch within 12 hours or as directed by MD    [provider]  Multiple Vitamins-Minerals (PRESERVISION AREDS 2 PO) Take 1 capsule by mouth in the morning and at bedtime. Patient not taking: Reported on 05/18/2022    [provider]  pantoprazole (PROTONIX) 40 MG tablet Take 1 tablet (40 mg total) by mouth daily. 08/02/22   Dorna Mai, MD  phenol (CHLORASEPTIC) 1.4 % LIQD Use as directed 1 spray in the mouth or throat as needed for throat irritation / pain. Patient not taking: Reported on 05/18/2022 07/02/21   Barrett, Lodema Hong, PA-C  pravastatin (PRAVACHOL) 10 MG tablet Take 1 tablet (10 mg total) by mouth daily. 08/02/22   Dorna Mai, MD  PRESCRIPTION MEDICATION Natrol memory complex daily    [provider]  Probiotic Product (PROBIOTIC DAILY PO) Take by mouth daily.    [provider]  RSV vaccine recomb adjuvanted (AREXVY) 120 MCG/0.5ML injection Inject into the muscle. 07/24/22   Carlyle Basques, MD  sertraline (ZOLOFT) 100 MG tablet Take 1 tablet (100 mg total) by mouth daily. 08/02/22   Dorna Mai, MD  traMADol (ULTRAM) 50 MG tablet Take 1 tablet (50 mg total) by mouth every 6 (six) hours as needed (mild pain). 03/29/22   Yvonna Alanis, NP    Physical Exam: Vitals:   09/22/22 1128 09/22/22 1130 09/22/22 1200 09/22/22 1245  BP:  127/67 115/68 116/63  Pulse:  82 77 77  Resp:  19 15 16   Temp:  98 F (36.7 C)    TempSrc:  Oral    SpO2:  98% 99% 92%  Weight: 74.8 kg     Height: 5\' 3"  (1.6 m)       Constitutional: Elderly female who appears to be in no acute distress at this time Eyes: PERRL, lids and conjunctivae normal ENMT: Mucous  membranes are  moist.   Neck: normal, supple, no masses, no thyromegaly Respiratory: clear to auscultation bilaterally, no wheezing, no crackles. Normal respiratory effort.   Cardiovascular: Regular rate and rhythm, no murmurs / rubs / gallops. No extremity edema.   Abdomen: no tenderness, no masses palpated.   Bowel sounds positive.  Musculoskeletal: no clubbing / cyanosis. No joint deformity upper and lower extremities. Good ROM, no contractures. Normal muscle tone.  Skin: no rashes, lesions, ulcers. No induration Neurologic: CN 2-12 grossly intact.  Abnormal sensation reported of the right hand and leg.  Able to move all extremities Psychiatric: Some memory impairment appreciated, but otherwise alert and oriented to person and place.  Data Reviewed:  Reviewed labs, imaging, and pertinent records as noted above in HPI.  EKG reveals sinus rhythm 81 bpm with RBBB and LAFB with QTc prolonged at 497  Assessment and Plan: Acute metabolic encephalopathy Patient presents after being noted to be more confused and having difficulty completing sentences with weakness and increased falls.  Patient's daughters make note that symptoms have been aggressive over the last couple of months but acutely worsened last week.  Patient noted to have concern for UTI, but not likely the cause of all of her symptoms.  Other factors include possibility of dehydration from taking furosemide daily, stroke, other infection, or aspect of dementia. -Admit to a medical telemetry -Delirium precautions -Neurochecks -Check salicylate and acetaminophen level -Check MRI of the brain  Urinary tract infection Urinalysis was positive for small hemoglobin, small leukocytes, many bacteria, and 6-10 WBCs.  Urine culture has been obtained. -Follow-up urine culture -Continue Rocephin IV  Falls and weakness Patient reported having 5 falls in the last 5 months with the last fall in the last week.  Patient reported to be generally  weak.  This may also be related to her use of furosemide. -Check vitamin B12 -Check orthostatic vital signs -Physical therapy to eval and treat in a.m.  Hypokalemia Acute.  Initial potassium noted to be 2.3.  Magnesium 1.8.  Patient had been ordered 40 meq of potassium chloride IV and 40 meq of potassium chloride p.o. Patient had only been able to tolerate taking potassium chloride 10 meq of the 40 meq ordered. -Hold furosemide -Recheck potassium levels  Essential hypertension Blood pressures currently maintained. -Hold furosemide  Hypothyroidism -Check TSH (1.762) -Continue levothyroxine  Anxiety and depression -Continue Zoloft and Xanax as needed  Hyperlipidemia -Continue pravastatin  Osteoporosis Noted on recent bone density scan with a T-score of -2.5 noted in the left forearm.  X-rays of the pelvis did not note any acute fracture after recent fall. -Patient recommended to take D and calcium  Prolonged QT interval QTc 497. -Correct electrolyte abnormalities -Avoid QT prolonging medications   DVT prophylaxis: Lovenox Advance Care Planning:   Code Status: Full Code   Consults: None  Family Communication: Family updated at bedside  Severity of Illness: The appropriate patient status for this patient is INPATIENT. Inpatient status is judged to be reasonable and necessary in order to provide the required intensity of service to ensure the patient's safety. The patient's presenting symptoms, physical exam findings, and initial radiographic and laboratory data in the context of their chronic comorbidities is felt to place them at high risk for further clinical deterioration. Furthermore, it is not anticipated that the patient will be medically stable for discharge from the hospital within 2 midnights of admission.   * I certify that at the point of admission it is my clinical judgment that the patient  will require inpatient hospital care spanning beyond 2 midnights from the  point of admission due to high intensity of service, high risk for further deterioration and high frequency of surveillance required.*  Author: Norval Morton, MD 09/22/2022 2:28 PM  For on call review [email or web address].

## 2022-09-22 NOTE — Discharge Instructions (Signed)
Please go to the emergency department as soon as you leave urgent care for further evaluation and management. ?

## 2022-09-22 NOTE — ED Triage Notes (Signed)
Pt presents to [facility] with co of confusion for a few months but worsening over the last few days. Pt family reports they are concerned for a uti. Pt reports no dysuria.

## 2022-09-22 NOTE — ED Notes (Signed)
Patient is being discharged from the Urgent Care and sent to the Emergency Department via pov with family . Per mound, np, patient is in need of higher level of care due to confusion. Patient is aware and verbalizes understanding of plan of care.  Vitals:   09/22/22 1017  BP: 127/66  Pulse: 84  Resp: 16  Temp: 97.9 F (36.6 C)  SpO2: 98%

## 2022-09-22 NOTE — ED Triage Notes (Signed)
Pt came in with daughter due to altered mental status.  Daughter at bedside reports this has been ongoing for one week; reports she normally is "very sharp".  Pt gait has also been altered compared to baseline.

## 2022-09-22 NOTE — ED Notes (Signed)
Patient transported to CT 

## 2022-09-22 NOTE — ED Notes (Signed)
ED TO INPATIENT HANDOFF REPORT  ED Nurse Name and Phone #: Kamen Hanken [phone]  S Name/Age/Gender Hubert Azure 72 y.o. female Room/Bed: 022C/022C  Code Status   Code Status: Prior  Home/SNF/Other Home Patient oriented to: self, place, time, and situation Is this baseline? Yes   Triage Complete: Triage complete  Chief Complaint UTI (urinary tract infection) [N39.0]  Triage Note Pt came in with daughter due to altered mental status.  Daughter at bedside reports this has been ongoing for one week; reports she normally is "very sharp".  Pt gait has also been altered compared to baseline.     Allergies Allergies  Allergen Reactions   Ethylene Oxide     Other reaction(s): scarred cornea   Other     Etholine Oxide: causes itching and hives, and temporary blindness    Level of Care/Admitting Diagnosis ED Disposition     ED Disposition  Admit   Condition  --   Comment  Hospital Area: [facility] [[id number]]  Level of Care: Telemetry Medical [104]  May admit patient to [facility] or [facility] if equivalent level of care is available:: No  Covid Evaluation: Asymptomatic - no recent exposure (last 10 days) testing not required  Diagnosis: UTI (urinary tract infection) [767341]  Admitting Physician: Norval Morton [[id number]]  Attending Physician: Norval Morton [[id number]]  Certification:: I certify this patient will need inpatient services for at least 2 midnights  Estimated Length of Stay: 2          B Medical/Surgery History Past Medical History:  Diagnosis Date   ADHD (attention deficit hyperactivity disorder) 2020   ADD   Anxiety    Arthritis    Broken heart syndrome 2013   Carpal tunnel syndrome    Cataract    Chronic kidney disease    Depression    Diabetes mellitus without complication ([place])    Type 2   Dysrhythmia 1976   tachycardia   Fall 04/10/2015   GERD (gastroesophageal reflux disease)    Goiter    History of shingles    On  abdomen, left ring finger, and left leg; Pt takes Valtrex   Hyperlipidemia    Hypertension    Hypothyroidism    lung ca 04/2021   Obesity    PTSD (post-traumatic stress disorder)    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   Shortness of breath dyspnea    pt stated its related to the back problems she has   Thyroid disease    Ulcer    Past Surgical History:  Procedure Laterality Date   ABDOMINAL HYSTERECTOMY  1996   BRONCHIAL BRUSHINGS  06/29/2021   Procedure: BRONCHIAL BRUSHINGS;  Surgeon: Collene Gobble, MD;  Location: [facility];  Service: Pulmonary;;   BRONCHIAL NEEDLE ASPIRATION BIOPSY  06/29/2021   Procedure: BRONCHIAL NEEDLE ASPIRATION BIOPSIES;  Surgeon: Collene Gobble, MD;  Location: [facility];  Service: Pulmonary;;   CATARACT EXTRACTION, BILATERAL     CESAREAN [place], 1984   COLONOSCOPY W/ POLYPECTOMY     EYE SURGERY Bilateral 2014   cataratact and lasik surgery   FIDUCIAL MARKER PLACEMENT  06/29/2021   Procedure: FIDUCIAL DYE MARKING;  Surgeon: Collene Gobble, MD;  Location: [facility];  Service: Pulmonary;;   INTERCOSTAL NERVE BLOCK  06/29/2021   Procedure: INTERCOSTAL NERVE BLOCK;  Surgeon: Lajuana Matte, MD;  Location: [facility];  Service: Thoracic;;   KYPHOPLASTY N/A 06/02/2015   Procedure: KYPHOPLASTY;  Surgeon:  Phylliss Bob, MD;  Location: [facility];  Service: Orthopedics;  Laterality: N/A;  Thoracic 3, 8, 10 kyphoplasty   LOBECTOMY  06/29/2021   Procedure: RIGHT UPPER LOBECTOMY;  Surgeon: Lajuana Matte, MD;  Location: [facility];  Service: Thoracic;;   LYMPH NODE DISSECTION  06/29/2021   Procedure: LYMPH NODE DISSECTION;  Surgeon: Lajuana Matte, MD;  Location: [facility];  Service: Thoracic;;     A IV Location/Drains/Wounds Patient Lines/Drains/Airways Status     Active Line/Drains/Airways     Name Placement date Placement time Site Days   Peripheral IV 06/29/21 18 G Left;Posterior Forearm 06/29/21  1036  Forearm  450   Peripheral IV  09/22/22 20 G Right Antecubital 09/22/22  1144  Antecubital  less than 1            Intake/Output Last 24 hours No intake or output data in the 24 hours ending 09/22/22 1435  Labs/Imaging Results for orders placed or performed during the hospital encounter of 09/22/22 (from the past 48 hour(s))  CBC with Differential     Status: Abnormal   Collection Time: 09/22/22 12:02 PM  Result Value Ref Range   WBC 9.5 4.0 - 10.5 K/uL   RBC 5.33 (H) 3.87 - 5.11 MIL/uL   Hemoglobin 14.6 12.0 - 15.0 g/dL   HCT 44.1 36.0 - 46.0 %   MCV 82.7 80.0 - 100.0 fL   MCH 27.4 26.0 - 34.0 pg   MCHC 33.1 30.0 - 36.0 g/dL   RDW 13.2 11.5 - 15.5 %   Platelets 265 150 - 400 K/uL   nRBC 0.0 0.0 - 0.2 %   Neutrophils Relative % 68 %   Neutro Abs 6.4 1.7 - 7.7 K/uL   Lymphocytes Relative 26 %   Lymphs Abs 2.4 0.7 - 4.0 K/uL   Monocytes Relative 5 %   Monocytes Absolute 0.5 0.1 - 1.0 K/uL   Eosinophils Relative 1 %   Eosinophils Absolute 0.1 0.0 - 0.5 K/uL   Basophils Relative 0 %   Basophils Absolute 0.0 0.0 - 0.1 K/uL   Immature Granulocytes 0 %   Abs Immature Granulocytes 0.02 0.00 - 0.07 K/uL    Comment: Performed at [facility], [address], [place], [place] [address]  Comprehensive metabolic panel     Status: Abnormal   Collection Time: 09/22/22 12:02 PM  Result Value Ref Range   Sodium 137 135 - 145 mmol/L   Potassium 2.3 (LL) 3.5 - 5.1 mmol/L    Comment: CRITICAL RESULT CALLED TO, READ BACK BY AND VERIFIED WITH R. Teryl Mcconaghy RN 09/22/22 @1243  BY J. WHITE   Chloride 90 (L) 98 - 111 mmol/L   CO2 32 22 - 32 mmol/L   Glucose, Bld 148 (H) 70 - 99 mg/dL    Comment: Glucose reference range applies only to samples taken after fasting for at least 8 hours.   BUN 9 8 - 23 mg/dL   Creatinine, Ser 1.10 (H) 0.44 - 1.00 mg/dL   Calcium 9.1 8.9 - 10.3 mg/dL   Total Protein 7.3 6.5 - 8.1 g/dL   Albumin 3.9 3.5 - 5.0 g/dL   AST 24 15 - 41 U/L   ALT 17 0 - 44 U/L   Alkaline Phosphatase 95 38 -  126 U/L   Total Bilirubin 0.8 0.3 - 1.2 mg/dL   GFR, Estimated 53 (L) >60 mL/min    Comment: (NOTE) Calculated using the CKD-EPI Creatinine Equation (2021)    Anion gap 15  5 - 15    Comment: Performed at [facility], [place] [address], [place], [place] [address]  Urinalysis, Routine w reflex microscopic -Urine, Clean Catch     Status: Abnormal   Collection Time: 09/22/22 12:08 PM  Result Value Ref Range   Color, Urine STRAW (A) YELLOW   APPearance CLEAR CLEAR   Specific Gravity, Urine 1.006 1.005 - 1.030   pH 5.0 5.0 - 8.0   Glucose, UA NEGATIVE NEGATIVE mg/dL   Hgb urine dipstick SMALL (A) NEGATIVE   Bilirubin Urine NEGATIVE NEGATIVE   Ketones, ur NEGATIVE NEGATIVE mg/dL   Protein, ur NEGATIVE NEGATIVE mg/dL   Nitrite NEGATIVE NEGATIVE   Leukocytes,Ua SMALL (A) NEGATIVE   RBC / HPF 0-5 0 - 5 RBC/hpf   WBC, UA 6-10 0 - 5 WBC/hpf   Bacteria, UA MANY (A) NONE SEEN   Squamous Epithelial / HPF 0-5 0 - 5 /HPF   Mucus PRESENT    Hyaline Casts, UA PRESENT     Comment: Performed at [facility] [address], [place], [place] [address]  Magnesium     Status: None   Collection Time: 09/22/22 12:10 PM  Result Value Ref Range   Magnesium 1.8 1.7 - 2.4 mg/dL    Comment: Performed at [facility] [address] [place], [place] [address]   CT Head Wo Contrast  Result Date: 09/22/2022 CLINICAL DATA:  Altered mental status EXAM: CT HEAD WITHOUT CONTRAST TECHNIQUE: Contiguous axial images were obtained from the base of the skull through the vertex without intravenous contrast. RADIATION DOSE REDUCTION: This exam was performed according to the departmental dose-optimization program which includes automated exposure control, adjustment of the mA and/or kV according to patient size and/or use of iterative reconstruction technique. COMPARISON:  07/12/2019 FINDINGS: Brain: No evidence of acute infarction, hemorrhage, hydrocephalus, extra-axial collection or mass lesion/mass  effect. Vascular: No hyperdense vessel or unexpected calcification. Skull: Normal. Negative for fracture or focal lesion. Sinuses/Orbits: No acute finding. Other: None. IMPRESSION: No acute intracranial abnormality. Electronically Signed   By: Davina Poke D.O.   On: 09/22/2022 13:18   DG Pelvis 1-2 Views  Result Date: 09/22/2022 CLINICAL DATA:  Fall. EXAM: PELVIS - 1 VIEW COMPARISON:  None Available. FINDINGS: There is no evidence of pelvic fracture or diastasis. No pelvic bone lesions are seen. Bilateral hip degenerative changes identified with joint space narrowing and osteophytes. Lumbosacral degenerative changes with sclerosis. IMPRESSION: Degenerative changes.  No acute osseous abnormalities. Electronically Signed   By: Sammie Bench M.D.   On: 09/22/2022 12:34   DG Chest 2 View  Result Date: 09/22/2022 CLINICAL DATA:  Altered mental status EXAM: CHEST - 2 VIEW COMPARISON:  Chest x-ray August 18, 2021 FINDINGS: The cardiomediastinal silhouette is unchanged in contour. No focal pulmonary opacity. No pleural effusion or pneumothorax. The visualized upper abdomen is unremarkable. Cement augmentation of multiple thoracic vertebral bodies. No acute osseous abnormality. IMPRESSION: No acute cardiopulmonary abnormality. Electronically Signed   By: Beryle Flock M.D.   On: 09/22/2022 12:32    Pending Labs Unresulted Labs (From admission, onward)     Start     Ordered   09/22/22 1417  Urine Culture (for pregnant, neutropenic or urologic patients or patients with an indwelling urinary catheter)  (Urine Labs)  Once,   URGENT       Question Answer Comment  Indication Altered mental status (if no other cause identified)   Patient immune status Normal   Release to  patient Immediate      09/22/22 1417            Vitals/Pain Today's Vitals   09/22/22 1128 09/22/22 1130 09/22/22 1200 09/22/22 1245  BP:  127/67 115/68 116/63  Pulse:  82 77 77  Resp:  19 15 16   Temp:  98 F (36.7 C)     TempSrc:  Oral    SpO2:  98% 99% 92%  Weight: 74.8 kg     Height: 5\' 3"  (1.6 m)     PainSc:        Isolation Precautions No active isolations  Medications Medications  potassium chloride 10 mEq in 100 mL IVPB (10 mEq Intravenous New Bag/Given 09/22/22 1345)  acetaminophen (TYLENOL) tablet 650 mg (has no administration in time range)  potassium chloride SA (KLOR-CON M) CR tablet 40 mEq (40 mEq Oral Given 09/22/22 1408)    Mobility walks with device     Focused Assessments Neuro Assessment     R Recommendations: See Admitting Provider Note  Report given to:   Additional Notes:

## 2022-09-22 NOTE — ED Provider Notes (Signed)
[facility]    CSN: [id number] Arrival date & time: 09/22/22  3664      History   Chief Complaint Chief Complaint  Patient presents with   Altered Mental Status    HPI Jenny Cardenas is a 72 y.o. female.   Patient presents with increased confusion over the past few days.  Daughters are present in the room and provide most of history.  Reports that she has been having difficulty completing sentences, having increased confusion, and falling over the past week.  Reports that she fell out of her bed yesterday.  They deny hitting head or losing consciousness.  She was reporting left-sided hip pain but patient reports that the hip pain was present prior to the fall.  They also report that she was at the grocery store yesterday and almost fell down there.  She does not take any blood thinning medications.  Reports that they called the triage nurse yesterday and they told her to come to urgent care due to concern for urinary tract infection.  Patient denies any urinary frequency or burning.  Daughters report that she lives with her 51 year old mother who she is the caregiver for.  They state that she takes care of herself and still drives so the confusion is completely different from baseline.  She typically can complete activities of daily living herself.   Altered Mental Status   Past Medical History:  Diagnosis Date   ADHD (attention deficit hyperactivity disorder) 2020   ADD   Anxiety    Arthritis    Broken heart syndrome 2013   Carpal tunnel syndrome    Cataract    Chronic kidney disease    Depression    Diabetes mellitus without complication ([place])    Type 2   Dysrhythmia 1976   tachycardia   Fall 04/10/2015   GERD (gastroesophageal reflux disease)    Goiter    History of shingles    On abdomen, left ring finger, and left leg; Pt takes Valtrex   Hyperlipidemia    Hypertension    Hypothyroidism    lung ca 04/2021   Obesity    PTSD (post-traumatic stress  disorder)    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   Shortness of breath dyspnea    pt stated its related to the back problems she has   Thyroid disease    Ulcer     Patient Active Problem List   Diagnosis Date Noted   Primary adenocarcinoma of upper lobe of right lung ([place]) 09/19/2021   Right lower lobe pulmonary nodule 06/29/2021   Pulmonary nodule 1 cm or greater in diameter 04/07/2021   Calcaneal fracture 07/14/2019   Fall 07/13/2019   Diabetes mellitus type 2 in obese ([place]) 07/13/2019   Closed right calcaneal fracture 07/13/2019   Acute pyelonephritis 10/01/2018   Acute diverticulitis 10/01/2018   Primary osteoarthritis of both hands 04/26/2017   History of vertebral fracture s/p kyphoplasty  04/26/2017   Hyperthyroidism 09/28/2013   Multinodular goiter 09/28/2013   Rash and nonspecific skin eruption 01/31/2013   Rash and other nonspecific skin eruption 10/16/2010   DIAB W/OTH MANIFESTS TYPE II/UNS TYPE UNCNTRL 04/10/2010   OSTEOPENIA 08/27/2008   UNS ADVRS EFF UNS RX MEDICINAL&BIOLOGICAL SBSTNC 09/18/2007   ACTINIC KERATOSIS 03/04/2007   DEPRESSION 02/13/2007   HYPERLIPIDEMIA 12/25/2006   OBESITY 12/25/2006   Essential hypertension 12/25/2006    Past Surgical History:  Procedure Laterality Date   ABDOMINAL HYSTERECTOMY  1996   BRONCHIAL BRUSHINGS  06/29/2021   Procedure: BRONCHIAL BRUSHINGS;  Surgeon: Collene Gobble, MD;  Location: [facility];  Service: Pulmonary;;   BRONCHIAL NEEDLE ASPIRATION BIOPSY  06/29/2021   Procedure: BRONCHIAL NEEDLE ASPIRATION BIOPSIES;  Surgeon: Collene Gobble, MD;  Location: [facility];  Service: Pulmonary;;   CATARACT EXTRACTION, BILATERAL     CESAREAN [place], 1984   COLONOSCOPY W/ POLYPECTOMY     EYE SURGERY Bilateral 2014   cataratact and lasik surgery   FIDUCIAL MARKER PLACEMENT  06/29/2021   Procedure: FIDUCIAL DYE MARKING;  Surgeon: Collene Gobble, MD;  Location: [facility];  Service: Pulmonary;;    INTERCOSTAL NERVE BLOCK  06/29/2021   Procedure: INTERCOSTAL NERVE BLOCK;  Surgeon: Lajuana Matte, MD;  Location: [facility];  Service: Thoracic;;   KYPHOPLASTY N/A 06/02/2015   Procedure: KYPHOPLASTY;  Surgeon: Phylliss Bob, MD;  Location: [facility];  Service: Orthopedics;  Laterality: N/A;  Thoracic 3, 8, 10 kyphoplasty   LOBECTOMY  06/29/2021   Procedure: RIGHT UPPER LOBECTOMY;  Surgeon: Lajuana Matte, MD;  Location: [facility];  Service: Thoracic;;   LYMPH NODE DISSECTION  06/29/2021   Procedure: LYMPH NODE DISSECTION;  Surgeon: Lajuana Matte, MD;  Location: [facility];  Service: Thoracic;;    OB History   No obstetric history on file.      Home Medications    Prior to Admission medications   Medication Sig Start Date End Date Taking? Authorizing Provider  albuterol (PROVENTIL) (2.5 MG/3ML) 0.083% nebulizer solution Take 3 mLs (2.5 mg total) by nebulization every 4 (four) hours as needed for wheezing or shortness of breath. 03/29/22   Fargo, Amy E, NP  albuterol (VENTOLIN HFA) 108 (90 Base) MCG/ACT inhaler Inhale 1-2 puffs into the lungs every 6 (six) hours as needed for wheezing or shortness of breath. This is a one-time refill from me until the patient establish care with her primary care physician.  Please do not send request for refill to me in the future. 03/22/22   Curt Bears, MD  ALPRAZolam Duanne Moron) 0.5 MG tablet Take 1 tablet (0.5 mg total) by mouth 3 (three) times daily as needed for anxiety. Patient not taking: Reported on 05/18/2022 07/14/19   Heath Lark D, DO  buPROPion (WELLBUTRIN XL) 150 MG 24 hr tablet Take 1 tablet (150 mg total) by mouth daily. 03/29/22   Fargo, Amy E, NP  cycloSPORINE (RESTASIS) 0.05 % ophthalmic emulsion Place 1 drop into both eyes 2 (two) times daily. Patient not taking: Reported on 05/18/2022    [provider]  cycloSPORINE (RESTASIS) 0.05 % ophthalmic emulsion Place 1 drop into both eyes 2 (two) times daily. 07/23/22   Dorna Mai, MD  diphenhydramine-acetaminophen (TYLENOL PM) 25-500 MG TABS tablet Take 2 tablets by mouth at bedtime as needed.    [provider]  fluconazole (DIFLUCAN) 200 MG tablet Take 1 tablet (200 mg total) by mouth daily. 07/23/22   Dorna Mai, MD  furosemide (LASIX) 20 MG tablet Take 1 tablet (20 mg total) by mouth daily. May take an additional dose if needed for swelling 08/02/22   Dorna Mai, MD  guaiFENesin (MUCINEX) 600 MG 12 hr tablet Take 1 tablet (600 mg total) by mouth 2 (two) times daily. 07/02/21   Barrett, Erin R, PA-C  ibuprofen (ADVIL) 200 MG tablet Take 200 mg by mouth as needed.    [provider]  levocetirizine (XYZAL) 5 MG tablet Take 5 mg by mouth at bedtime.    [provider]  levothyroxine (SYNTHROID) 25 MCG tablet Take 1 tablet (25 mcg total) by mouth daily before breakfast. 08/02/22   Dorna Mai, MD  lidocaine (LIDODERM) 5 % Place 1 patch onto the skin daily as needed (irritation). Remove & Discard patch within 12 hours or as directed by MD    [provider]  Multiple Vitamins-Minerals (PRESERVISION AREDS 2 PO) Take 1 capsule by mouth in the morning and at bedtime. Patient not taking: Reported on 05/18/2022    [provider]  pantoprazole (PROTONIX) 40 MG tablet Take 1 tablet (40 mg total) by mouth daily. 08/02/22   Dorna Mai, MD  phenol (CHLORASEPTIC) 1.4 % LIQD Use as directed 1 spray in the mouth or throat as needed for throat irritation / pain. Patient not taking: Reported on 05/18/2022 07/02/21   Barrett, Lodema Hong, PA-C  pravastatin (PRAVACHOL) 10 MG tablet Take 1 tablet (10 mg total) by mouth daily. 08/02/22   Dorna Mai, MD  PRESCRIPTION MEDICATION Natrol memory complex daily    [provider]  Probiotic Product (PROBIOTIC DAILY PO) Take by mouth daily.    [provider]  RSV vaccine recomb adjuvanted (AREXVY) 120 MCG/0.5ML injection Inject into the muscle. 07/24/22   Carlyle Basques, MD  sertraline (ZOLOFT) 100 MG tablet Take 1 tablet (100 mg total) by mouth daily. 08/02/22   Dorna Mai, MD  traMADol (ULTRAM) 50 MG tablet Take 1 tablet (50 mg total) by mouth every 6 (six) hours as needed (mild pain). 03/29/22   Yvonna Alanis, NP    Family History Family History  Problem Relation Age of Onset   COPD Mother    Depression Mother    Hypertension Mother    Hyperlipidemia Mother    Breast cancer Mother 41   Diverticulosis Mother    Heart disease Father    Dementia Father    Breast cancer Sister 48   Non-Hodgkin's lymphoma Sister    Colon cancer Neg Hx     Social History Social History   Tobacco Use   Smoking status: Former    Packs/day: 0.50    Years: 4.00    Total pack years: 2.00    Types: Cigarettes    Quit date: 06/28/2016    Years since quitting: 6.2   Smokeless tobacco: Never  Vaping Use   Vaping Use: Never used  Substance Use Topics   Alcohol use: Not Currently    Comment: rare glass of wine (holidays only)   Drug use: Never     Allergies   Ethylene oxide and Other   Review of Systems Review of Systems Per HPI  Physical Exam Triage Vital Signs ED Triage Vitals  Enc Vitals Group     BP 09/22/22 1017 127/66     Pulse Rate 09/22/22 1017 84     Resp 09/22/22 1017 16     Temp 09/22/22 1017 97.9 F (36.6 C)     Temp src --      SpO2 09/22/22 1017 98 %     Weight --      Height --      Head Circumference --      Peak Flow --      Pain Score 09/22/22 1015 0     Pain Loc --      Pain Edu? --      Excl. in [place]? --    No data found.  Updated Vital Signs BP 127/66   Pulse 84   Temp 97.9 F (36.6  C)   Resp 16   SpO2 98%   Visual Acuity Right Eye Distance:   Left Eye Distance:   Bilateral Distance:    Right Eye Near:   Left Eye Near:    Bilateral Near:     Physical Exam Constitutional:      General: She is not in acute distress.    Appearance: Normal appearance. She is not toxic-appearing or diaphoretic.   HENT:     Head: Normocephalic and atraumatic.  Eyes:     Extraocular Movements: Extraocular movements intact.     Conjunctiva/sclera: Conjunctivae normal.     Pupils: Pupils are equal, round, and reactive to light.  Cardiovascular:     Rate and Rhythm: Normal rate and regular rhythm.     Pulses: Normal pulses.     Heart sounds: Normal heart sounds.  Pulmonary:     Effort: Pulmonary effort is normal. No respiratory distress.     Breath sounds: Normal breath sounds. No stridor. No wheezing, rhonchi or rales.  Musculoskeletal:     Comments: Tenderness to palpation to left lateral hip/lateral buttocks area on left side.  No bruising, swelling, discoloration noted.  Patient has full range of motion of hip.  Neurological:     General: No focal deficit present.     Mental Status: She is alert and oriented to person, place, and time. Mental status is at baseline.     Cranial Nerves: Cranial nerves 2-12 are intact.     Sensory: Sensation is intact.     Motor: Motor function is intact.     Coordination: Coordination is intact.     Gait: Gait is intact.  Psychiatric:        Mood and Affect: Mood normal.        Behavior: Behavior normal.        Thought Content: Thought content normal.        Judgment: Judgment normal.      UC Treatments / Results  Labs (all labs ordered are listed, but only abnormal results are displayed) Labs Reviewed - No data to display  EKG   Radiology No results found.  Procedures Procedures (including critical care time)  Medications Ordered in UC Medications - No data to display  Initial Impression / Assessment and Plan / UC Course  I have reviewed the triage vital signs and the nursing notes.  Pertinent labs & imaging results that were available during my care of the patient were reviewed by me and considered in my medical decision making (see chart for details).     Patient presenting with altered mental status and increase in falls over the  past week.  I do think the patient needs full extensive evaluation to determine etiology of this.  Do not have these resources here in urgent care so advised patient and her daughters to take her to the ER for further evaluation and management.  They were all agreeable with plan.  Vital signs and neuroexam stable at discharge.  Agree with patient's daughter transporting her to the ER. Final Clinical Impressions(s) / UC Diagnoses   Final diagnoses:  Altered mental status, unspecified altered mental status type     Discharge Instructions      Please go to the emergency department as soon as you leave urgent care for further evaluation and management.    ED Prescriptions   None    PDMP not reviewed this encounter.   Teodora Medici, [place] 09/22/22 1049

## 2022-09-22 NOTE — ED Notes (Signed)
Patient transported to X-ray 

## 2022-09-23 DIAGNOSIS — E873 Alkalosis: Secondary | ICD-10-CM

## 2022-09-23 DIAGNOSIS — R410 Disorientation, unspecified: Secondary | ICD-10-CM

## 2022-09-23 DIAGNOSIS — E876 Hypokalemia: Secondary | ICD-10-CM | POA: Diagnosis not present

## 2022-09-23 DIAGNOSIS — G9341 Metabolic encephalopathy: Secondary | ICD-10-CM | POA: Diagnosis not present

## 2022-09-23 DIAGNOSIS — R531 Weakness: Secondary | ICD-10-CM | POA: Diagnosis not present

## 2022-09-23 LAB — BASIC METABOLIC PANEL
Anion gap: 11 (ref 5–15)
BUN: 8 mg/dL (ref 8–23)
CO2: 33 mmol/L — ABNORMAL HIGH (ref 22–32)
Calcium: 8.6 mg/dL — ABNORMAL LOW (ref 8.9–10.3)
Chloride: 98 mmol/L (ref 98–111)
Creatinine, Ser: 1.12 mg/dL — ABNORMAL HIGH (ref 0.44–1.00)
GFR, Estimated: 52 mL/min — ABNORMAL LOW (ref >60)
Glucose, Bld: 129 mg/dL — ABNORMAL HIGH (ref 70–99)
Potassium: 2.9 mmol/L — ABNORMAL LOW (ref 3.5–5.1)
Sodium: 142 mmol/L (ref 135–145)

## 2022-09-23 LAB — CBC
HCT: 36.4 % (ref 36.0–46.0)
Hemoglobin: 12.1 g/dL (ref 12.0–15.0)
MCH: 27.5 pg (ref 26.0–34.0)
MCHC: 33.2 g/dL (ref 30.0–36.0)
MCV: 82.7 fL (ref 80.0–100.0)
Platelets: 266 10*3/uL (ref 150–400)
RBC: 4.4 MIL/uL (ref 3.87–5.11)
RDW: 13.3 % (ref 11.5–15.5)
WBC: 6.9 10*3/uL (ref 4.0–10.5)
nRBC: 0 % (ref 0.0–0.2)

## 2022-09-23 LAB — CK: Total CK: 36 U/L — ABNORMAL LOW (ref 38–234)

## 2022-09-23 MED ORDER — SODIUM CHLORIDE 0.9 % IV SOLN: INTRAVENOUS | Status: AC

## 2022-09-23 MED ORDER — MAGNESIUM OXIDE -MG SUPPLEMENT 400 (240 MG) MG PO TABS
400.0000 mg | ORAL_TABLET | Freq: Two times a day (BID) | ORAL | Status: AC
  Administered 2022-09-23 (×2): 400 mg via ORAL
  Filled 2022-09-23 (×2): qty 1

## 2022-09-23 MED ORDER — CYANOCOBALAMIN 1000 MCG/ML IJ SOLN
1000.0000 ug | Freq: Every day | INTRAMUSCULAR | Status: DC
  Administered 2022-09-23 – 2022-09-25 (×3): 1000 ug via SUBCUTANEOUS
  Filled 2022-09-23 (×3): qty 1

## 2022-09-23 MED ORDER — POTASSIUM CHLORIDE 20 MEQ PO PACK
40.0000 meq | PACK | Freq: Three times a day (TID) | ORAL | Status: AC
  Administered 2022-09-23 (×3): 40 meq via ORAL
  Filled 2022-09-23 (×3): qty 2

## 2022-09-23 NOTE — Plan of Care (Signed)
  Problem: Health Behavior/Discharge Planning: Goal: Ability to manage health-related needs will improve Outcome: Progressing   Problem: Activity: Goal: Risk for activity intolerance will decrease Outcome: Progressing   Problem: Nutrition: Goal: Adequate nutrition will be maintained Outcome: Progressing   

## 2022-09-23 NOTE — Evaluation (Signed)
Physical Therapy Evaluation Patient Details Name: Jenny Cardenas MRN: [id number] DOB: [birth year] Today's Date: 09/23/2022  History of Present Illness  Patient is a 72 y/o female who presents on 2/10 with AMS, confusion, weakness and falls. CT negative for acute abnormailities. Found to have acute metabolic encephalopathy and possible UTI. PMH includes DM, ADHD, CKD, depression, HTN, lung ca status post right upper lobectomy, PTSD.  Clinical Impression  Patient presents with generalized weakness, impaired cognition, impaired balance and impaired mobility s/p above. Pt lives at home with her 37 y/o mother and is normally independent for ADLs and IADLs at baseline. Today, pt tolerated transfers and gait training with Min guard-min A and use of RW for support. Required seated rest break due to weakness/dizziness/fatigue. Daughters reports pt will have sister stay with her and have 24/7 supervision at d/c. Provided gait belt to family. Will need to practice stair negotiation tomorrow morning prior to d/c as she has to climb 6-7 to enter home. Will follow acutely to maximize independence and mobility prior to return home.       Recommendations for follow up therapy are one component of a multi-disciplinary discharge planning process, led by the attending physician.  Recommendations may be updated based on patient status, additional functional criteria and insurance authorization.  Follow Up Recommendations Home health PT      Assistance Recommended at Discharge Intermittent Supervision/Assistance  Patient can return home with the following  A little help with walking and/or transfers;A little help with bathing/dressing/bathroom;Assistance with cooking/housework;Help with stairs or ramp for entrance;Direct supervision/assist for financial management;Direct supervision/assist for medications management;Assist for transportation    Equipment Recommendations Rolling walker (2 wheels)  Recommendations for  Other Services       Functional Status Assessment Patient has had a recent decline in their functional status and demonstrates the ability to make significant improvements in function in a reasonable and predictable amount of time.     Precautions / Restrictions Precautions Precautions: Fall Restrictions Weight Bearing Restrictions: No      Mobility  Bed Mobility               General bed mobility comments: Sitting EOB upon PT arrival.    Transfers Overall transfer level: Needs assistance Equipment used: Rolling walker (2 wheels) Transfers: Sit to/from Stand Sit to Stand: Min guard           General transfer comment: Min guard for safety. Cues for hand placement/technique. Transferred to chair post ambulation.    Ambulation/Gait Ambulation/Gait assistance: Min assist, Min guard Gait Distance (Feet): 14 Feet (+ 25') Assistive device: Rolling walker (2 wheels) Gait Pattern/deviations: Step-through pattern, Decreased stride length, Trunk flexed Gait velocity: decreased Gait velocity interpretation: <1.31 ft/sec, indicative of household ambulator   General Gait Details: Slow, unsteady gait with bil knee instability, 1 seated rest break needed. + dizziness, which resolved.  Stairs            Wheelchair Mobility    Modified Rankin (Stroke Patients Only)       Balance Overall balance assessment: Needs assistance Sitting-balance support: Feet supported, No upper extremity supported Sitting balance-Leahy Scale: Good Sitting balance - Comments: can reach outside BOS without LOB   Standing balance support: During functional activity, Bilateral upper extremity supported Standing balance-Leahy Scale: Fair Standing balance comment: benefits from external support esp for dynamic tasks  Pertinent Vitals/Pain Pain Assessment Pain Assessment: No/denies pain    Home Living Family/patient expects to be discharged to::  Private residence Living Arrangements: Parent;Other relatives (sister will be staying, 75 y/o mother) Available Help at Discharge: Family;Available 24 hours/day Type of Home: House Home Access: Stairs to enter   [employer] of Steps: 6-8   Home Layout: One level Home Equipment: Tub bench;Cane - single point;Adaptive equipment      Prior Function Prior Level of Function : Independent/Modified Independent             Mobility Comments: was using cane PRN, performing IADLs for 92 y/o mother ADLs Comments: independent     Hand Dominance        Extremity/Trunk Assessment   Upper Extremity Assessment Upper Extremity Assessment: Defer to OT evaluation    Lower Extremity Assessment Lower Extremity Assessment: Generalized weakness    Cervical / Trunk Assessment Cervical / Trunk Assessment: Normal  Communication   Communication: No difficulties  Cognition Arousal/Alertness: Awake/alert Behavior During Therapy: WFL for tasks assessed/performed Overall Cognitive Status: Impaired/Different from baseline Area of Impairment: Memory                     Memory: Decreased short-term memory         General Comments: per family, pt close to baseline, some difficulty recalling events of hospital admission and sometimes tangential.        General Comments General comments (skin integrity, edema, etc.): VSS on RA, 2 daughters present during session.    Exercises     Assessment/Plan    PT Assessment Patient needs continued PT services  PT Problem List Decreased strength;Decreased mobility;Decreased safety awareness;Decreased balance;Decreased knowledge of use of DME;Decreased cognition       PT Treatment Interventions Therapeutic activities;DME instruction;Gait training;Therapeutic exercise;Patient/family education;Balance training;Stair training;Functional mobility training    PT Goals (Current goals can be found in the Care Plan section)  Acute  Rehab PT Goals Patient Stated Goal: to get better and go home PT Goal Formulation: With patient Time For Goal Achievement: 10/07/22 Potential to Achieve Goals: Good    Frequency Min 3X/week     Co-evaluation               AM-PAC PT "6 Clicks" Mobility  Outcome Measure Help needed turning from your back to your side while in a flat bed without using bedrails?: A Little Help needed moving from lying on your back to sitting on the side of a flat bed without using bedrails?: A Little Help needed moving to and from a bed to a chair (including a wheelchair)?: A Little Help needed standing up from a chair using your arms (e.g., wheelchair or bedside chair)?: A Little Help needed to walk in hospital room?: A Little Help needed climbing 3-5 steps with a railing? : A Lot 6 Click Score: 17    End of Session Equipment Utilized During Treatment: Gait belt Activity Tolerance: Patient tolerated treatment well Patient left: in chair;with call bell/phone within reach;with family/visitor present;with chair alarm set Nurse Communication: Mobility status PT Visit Diagnosis: Unsteadiness on feet (R26.81);Muscle weakness (generalized) (M62.81);Difficulty in walking, not elsewhere classified (R26.2);Dizziness and giddiness (R42)    Time: 2952-8413 PT Time Calculation (min) (ACUTE ONLY): 32 min   Charges:   PT Evaluation $PT Eval Moderate Complexity: 1 Mod PT Treatments $Gait Training: 8-22 mins        Marisa Severin, PT, DPT Acute Rehabilitation Services Secure chat preferred Office [phone]  Marguarite Arbour A Aliviana Burdell 09/23/2022, 3:15 PM

## 2022-09-23 NOTE — Progress Notes (Signed)
[facility] PROGRESS NOTE    Progress Note  Jenny Cardenas  YTK:[id number] DOB: 1950-11-20 DOA: 09/22/2022 PCP: Dorna Mai, MD     Brief Narrative:   Jenny Cardenas is an 72 y.o. female past medical history significant for essential hypertension hypothyroidism, diabetes mellitus type 2, history of lung cancer status post right upper lobectomy, depression comes in after being noted by family that he started getting confused about a week prior to admission.  Patient care for her 40 year old mother, family reports that over the last 5 months her mental status is declining, she is also been getting weak and fallen, has not been started on any new medications, the family reports that he is she has not been taking her Adderall for over a year.  CT of the head showed no acute abnormalities along with x-ray.  UA appears to be infected urine cultures were sent   Assessment/Plan:   Acute metabolic encephalopathy Patient is having difficulty completing sentences weak and with increased falls. Daughter also relates that patient has been aggressive over the last couple of weeks. MRI of the brain shows no acute abnormalities chronic ischemic changes. Electrolytes are unremarkable creatinine is at baseline LFTs are unremarkable. Potassium is significantly low.  In the setting of diuretic use daily (supposed as needed as needed)  Possible urinary tract infection: UA appears to be infected urine culture was sent as well as blood cultures. Was started on IV Rocephin.  She has remained afebrile with no leukocytosis.  Metabolic alkalosis: Likely due to overdiuresis start IV fluids recheck basic metabolic panel in the morning.  Fall and weakness: Is following with 5 times over the last 6 months. B12 264 will start repletion, TSH 1.7. Orthostatics vitals are unremarkable PT OT eval is pending.  Hypokalemia: Likely due to dehydration replete orally recheck tomorrow morning in the setting of  Lasix use.  Essential hypertension: Blood pressure stable hold diuretic therapy.  Hypothyroidism: Continue Synthroid.  Anxiety and depression: Continue Zoloft and Xanax as needed.  Hyperlipidemia:  Continue statins.  Osteoporosis: Follow-up with PCP as an outpatient.  Prolonged QTc: Try to keep her potassium greater than 4 and magnesium greater than 2    DVT prophylaxis: lovenox Family Communication:none Status is: Inpatient Remains inpatient appropriate because: acute metabolic encaphalapthy    Code Status:     Code Status Orders  (From admission, onward)           Start     Ordered   09/22/22 1449  Full code  Continuous       Question:  By:  Answer:  Consent: discussion documented in EHR   09/22/22 1449           Code Status History     Date Active Date Inactive Code Status Order ID Comments User Context   06/29/2021 1701 07/02/2021 1803 Full Code [id number]  Arnoldo Lenis Inpatient   07/13/2019 0109 07/18/2019 1641 Full Code [id number]  [name], MD ED   10/01/2018 0900 10/04/2018 1450 Full Code [id number]  Bonnielee Haff, MD Inpatient         IV Access:   Peripheral IV   Procedures and diagnostic studies:   MR BRAIN WO CONTRAST  Result Date: 09/22/2022 CLINICAL DATA:  Initial evaluation for mental status change, unknown cause. EXAM: MRI HEAD WITHOUT CONTRAST TECHNIQUE: Multiplanar, multiecho pulse sequences of the brain and surrounding structures were obtained without intravenous contrast. COMPARISON:  CT from earlier the same day. FINDINGS: Brain: Cerebral volume  within normal limits. Mild scattered patchy T2/FLAIR hyperintensity involving the periventricular deep white matter both cerebral hemispheres, most consistent with chronic small vessel ischemic disease, mild for age. No evidence for acute or subacute ischemia. Gray-white matter differentiation maintained. No areas of chronic cortical infarction. No acute or chronic  intracranial blood products. No mass lesion, midline shift or mass effect no hydrocephalus or extra-axial fluid collection. Pituitary gland and suprasellar region within normal limits. Vascular: Major intracranial vascular flow voids are maintained. Skull and upper cervical spine: Craniocervical junction within normal limits. Bone marrow signal intensity normal. No scalp soft tissue abnormality. Sinuses/Orbits: Globes and orbital soft tissues demonstrate no acute finding. Prior bilateral ocular lens replacement. Paranasal sinuses are clear. No significant mastoid effusion. Other: None. IMPRESSION: 1. No acute intracranial abnormality. 2. Mild chronic microvascular ischemic disease for age. Electronically Signed   By: Jeannine Boga M.D.   On: 09/22/2022 23:08   CT Head Wo Contrast  Result Date: 09/22/2022 CLINICAL DATA:  Altered mental status EXAM: CT HEAD WITHOUT CONTRAST TECHNIQUE: Contiguous axial images were obtained from the base of the skull through the vertex without intravenous contrast. RADIATION DOSE REDUCTION: This exam was performed according to the departmental dose-optimization program which includes automated exposure control, adjustment of the mA and/or kV according to patient size and/or use of iterative reconstruction technique. COMPARISON:  07/12/2019 FINDINGS: Brain: No evidence of acute infarction, hemorrhage, hydrocephalus, extra-axial collection or mass lesion/mass effect. Vascular: No hyperdense vessel or unexpected calcification. Skull: Normal. Negative for fracture or focal lesion. Sinuses/Orbits: No acute finding. Other: None. IMPRESSION: No acute intracranial abnormality. Electronically Signed   By: Davina Poke D.O.   On: 09/22/2022 13:18   DG Pelvis 1-2 Views  Result Date: 09/22/2022 CLINICAL DATA:  Fall. EXAM: PELVIS - 1 VIEW COMPARISON:  None Available. FINDINGS: There is no evidence of pelvic fracture or diastasis. No pelvic bone lesions are seen. Bilateral hip  degenerative changes identified with joint space narrowing and osteophytes. Lumbosacral degenerative changes with sclerosis. IMPRESSION: Degenerative changes.  No acute osseous abnormalities. Electronically Signed   By: Sammie Bench M.D.   On: 09/22/2022 12:34   DG Chest 2 View  Result Date: 09/22/2022 CLINICAL DATA:  Altered mental status EXAM: CHEST - 2 VIEW COMPARISON:  Chest x-ray August 18, 2021 FINDINGS: The cardiomediastinal silhouette is unchanged in contour. No focal pulmonary opacity. No pleural effusion or pneumothorax. The visualized upper abdomen is unremarkable. Cement augmentation of multiple thoracic vertebral bodies. No acute osseous abnormality. IMPRESSION: No acute cardiopulmonary abnormality. Electronically Signed   By: Beryle Flock M.D.   On: 09/22/2022 12:32     Medical Consultants:   None.   Subjective:    LORELEY SCHWALL   Objective:    Vitals:   09/22/22 1546 09/22/22 2116 09/23/22 0414 09/23/22 0414  BP: 110/70 (!) 107/56 (!) 116/55 (!) 116/55  Pulse: 72 67 67 70  Resp: 17 16 16 16   Temp: 98.2 F (36.8 C) 97.9 F (36.6 C) (!) 97.5 F (36.4 C) (!) 97.5 F (36.4 C)  TempSrc: Oral Oral Oral Oral  SpO2: 95% 98% 96% 95%  Weight:      Height:       SpO2: 95 %   Intake/Output Summary (Last 24 hours) at 09/23/2022 0653 Last data filed at 09/22/2022 1822 Gross per 24 hour  Intake 357.78 ml  Output --  Net 357.78 ml   Filed Weights   09/22/22 1128  Weight: 74.8 kg    Exam: General exam:  In no acute distress. Respiratory system: Good air movement and clear to auscultation. Cardiovascular system: S1 & S2 heard, RRR. No JVD. Gastrointestinal system: Abdomen is nondistended, soft and nontender.  Extremities: No pedal edema. Skin: No rashes, lesions or ulcers Psychiatry: Judgement and insight appear normal.   Data Reviewed:    Labs: Basic Metabolic Panel: Recent Labs  Lab 09/22/22 1202 09/22/22 1210 09/22/22 1641 09/23/22 0245  NA  137  --   --  142  K 2.3*  --  2.5* 2.9*  CL 90*  --   --  98  CO2 32  --   --  33*  GLUCOSE 148*  --   --  129*  BUN 9  --   --  8  CREATININE 1.10*  --   --  1.12*  CALCIUM 9.1  --   --  8.6*  MG  --  1.8  --   --    GFR Estimated Creatinine Clearance: 44 mL/min (A) (by C-G formula based on SCr of 1.12 mg/dL (H)). Liver Function Tests: Recent Labs  Lab 09/22/22 1202  AST 24  ALT 17  ALKPHOS 95  BILITOT 0.8  PROT 7.3  ALBUMIN 3.9   No results for input(s): "LIPASE", "AMYLASE" in the last 168 hours. No results for input(s): "AMMONIA" in the last 168 hours. Coagulation profile No results for input(s): "INR", "PROTIME" in the last 168 hours. COVID-19 Labs  No results for input(s): "DDIMER", "FERRITIN", "LDH", "CRP" in the last 72 hours.  Lab Results  Component Value Date   SARSCOV2NAA NEGATIVE 06/27/2021   [place] NEGATIVE 07/17/2019   [place] NEGATIVE 07/13/2019    CBC: Recent Labs  Lab 09/22/22 1202 09/23/22 0245  WBC 9.5 6.9  NEUTROABS 6.4  --   HGB 14.6 12.1  HCT 44.1 36.4  MCV 82.7 82.7  PLT 265 266   Cardiac Enzymes: Recent Labs  Lab 09/23/22 0245  CKTOTAL 36*   BNP (last 3 results) No results for input(s): "PROBNP" in the last 8760 hours. CBG: No results for input(s): "GLUCAP" in the last 168 hours. D-Dimer: No results for input(s): "DDIMER" in the last 72 hours. Hgb A1c: No results for input(s): "HGBA1C" in the last 72 hours. Lipid Profile: No results for input(s): "CHOL", "HDL", "LDLCALC", "TRIG", "CHOLHDL", "LDLDIRECT" in the last 72 hours. Thyroid function studies: Recent Labs    09/22/22 1641  TSH 1.762   Anemia work up: Recent Labs    09/22/22 1200  VITAMINB12 264   Sepsis Labs: Recent Labs  Lab 09/22/22 1202 09/23/22 0245  WBC 9.5 6.9   Microbiology No results found for this or any previous visit (from the past 240 hour(s)).   Medications:    buPROPion  150 mg Oral Daily   enoxaparin (LOVENOX) injection   40 mg Subcutaneous Q24H   levothyroxine  25 mcg Oral QAC breakfast   pravastatin  10 mg Oral QHS   sertraline  100 mg Oral Daily   sodium chloride flush  3 mL Intravenous Q12H   traZODone  50 mg Oral QHS   Continuous Infusions:  cefTRIAXone (ROCEPHIN)  IV Stopped (09/22/22 1752)      LOS: 1 day   Charlynne Cousins  [facility]  09/23/2022, 6:53 AM

## 2022-09-23 NOTE — Evaluation (Signed)
Occupational Therapy Evaluation Patient Details Name: Jenny Cardenas MRN: [id number] DOB: 05-19-1951 Today's Date: 09/23/2022   History of Present Illness Patient is a 72 y/o female who presents on 2/10 with AMS, confusion, weakness and falls. CT negative for acute abnormailities. Found to have acute metabolic encephalopathy and possible UTI. PMH includes DM, ADHD, CKD, depression, HTN, lung ca status post right upper lobectomy, PTSD.   Clinical Impression   Pt reports independence at baseline with ADLs and uses cane rarely for mobility. Pt lives with her mother who is in her 53s, however both are very independent, pt's daughters in room during session, report pt's sister will be staying with her at d/c to assist also. Pt currently needing min guard-mod A for ADLs, min A for bed mobility, and min guard for transfers with 2 person HHA. Pt presenting with impairments listed below, will follow acutely. Recommend HHOT at d/c.      Recommendations for follow up therapy are one component of a multi-disciplinary discharge planning process, led by the attending physician.  Recommendations may be updated based on patient status, additional functional criteria and insurance authorization.   Follow Up Recommendations  Home health OT     Assistance Recommended at Discharge Intermittent Supervision/Assistance  Patient can return home with the following A little help with walking and/or transfers;A lot of help with bathing/dressing/bathroom;Assistance with cooking/housework;Direct supervision/assist for medications management;Direct supervision/assist for financial management;Assist for transportation;Help with stairs or ramp for entrance    Functional Status Assessment  Patient has had a recent decline in their functional status and demonstrates the ability to make significant improvements in function in a reasonable and predictable amount of time.  Equipment Recommendations  Other (comment) (RW)     Recommendations for Other Services PT consult     Precautions / Restrictions Precautions Precautions: Fall Restrictions Weight Bearing Restrictions: No      Mobility Bed Mobility Overal bed mobility: Needs Assistance Bed Mobility: Supine to Sit     Supine to sit: Min assist     General bed mobility comments: for trunk elevation    Transfers Overall transfer level: Needs assistance Equipment used: 2 person hand held assist Transfers: Sit to/from Stand Sit to Stand: Min guard           General transfer comment: for stability      Balance Overall balance assessment: Needs assistance Sitting-balance support: Feet supported, No upper extremity supported Sitting balance-Leahy Scale: Good Sitting balance - Comments: can reach outside BOS without LOB   Standing balance support: Bilateral upper extremity supported, During functional activity Standing balance-Leahy Scale: Fair Standing balance comment: benefits from external support                           ADL either performed or assessed with clinical judgement   ADL Overall ADL's : Needs assistance/impaired Eating/Feeding: Supervision/ safety   Grooming: Min guard   Upper Body Bathing: Minimal assistance   Lower Body Bathing: Moderate assistance   Upper Body Dressing : Minimal assistance   Lower Body Dressing: Moderate assistance   Toilet Transfer: Min guard;Ambulation;Regular Museum/gallery exhibitions officer and Hygiene: Min guard       Functional mobility during ADLs: Min guard       Vision Baseline Vision/History: 1 Wears glasses Vision Assessment?: No apparent visual deficits     Perception Perception Perception Tested?: No   Praxis Praxis Praxis tested?: Not tested    Pertinent Vitals/Pain Pain  Assessment Pain Assessment: No/denies pain     Hand Dominance     Extremity/Trunk Assessment Upper Extremity Assessment Upper Extremity Assessment: Generalized  weakness (reports new dx of OA in hands)   Lower Extremity Assessment Lower Extremity Assessment: Defer to PT evaluation   Cervical / Trunk Assessment Cervical / Trunk Assessment: Normal   Communication Communication Communication: No difficulties   Cognition Arousal/Alertness: Awake/alert Behavior During Therapy: WFL for tasks assessed/performed                                   General Comments: per family, pt close to baseline, some difficulty recalling events of hospital admission     General Comments  VSS on RA    Exercises     Shoulder Instructions      Home Living Family/patient expects to be discharged to:: Private residence Living Arrangements: Parent Available Help at Discharge: Family Type of Home: House Home Access: Stairs to enter Technical brewer of Steps: 6-8 (working on getting a ramp)   Home Layout: One level     Bathroom Shower/Tub: Tub/shower unit         Home Equipment: Tub bench;Cane - single point;Adaptive equipment Adaptive Equipment: Reacher        Prior Functioning/Environment Prior Level of Function : Independent/Modified Independent             Mobility Comments: was using cane PRN ADLs Comments: ind        OT Problem List: Decreased strength;Decreased range of motion;Decreased activity tolerance;Impaired balance (sitting and/or standing);Decreased cognition;Decreased safety awareness      OT Treatment/Interventions: Self-care/ADL training;Therapeutic exercise;DME and/or AE instruction;Energy conservation;Therapeutic activities;Patient/family education;Balance training    OT Goals(Current goals can be found in the care plan section) Acute Rehab OT Goals Patient Stated Goal: none stated OT Goal Formulation: With patient Time For Goal Achievement: 10/07/22 Potential to Achieve Goals: Good ADL Goals Pt Will Perform Upper Body Dressing: with modified independence;sitting Pt Will Perform Lower Body  Dressing: with modified independence;with adaptive equipment;sitting/lateral leans;sit to/from stand Pt Will Transfer to Toilet: with modified independence;ambulating;regular height toilet Pt Will Perform Tub/Shower Transfer: Shower transfer;shower seat;ambulating  OT Frequency: Min 2X/week    Co-evaluation              AM-PAC OT "6 Clicks" Daily Activity     Outcome Measure Help from another person eating meals?: None Help from another person taking care of personal grooming?: A Little Help from another person toileting, which includes using toliet, bedpan, or urinal?: A Little Help from another person bathing (including washing, rinsing, drying)?: A Lot Help from another person to put on and taking off regular upper body clothing?: A Little Help from another person to put on and taking off regular lower body clothing?: A Lot 6 Click Score: 17   End of Session Equipment Utilized During Treatment: Gait belt Nurse Communication: Mobility status  Activity Tolerance: Patient tolerated treatment well Patient left: Other (comment) (seated EOB , handoff to PT)  OT Visit Diagnosis: Unsteadiness on feet (R26.81);Other abnormalities of gait and mobility (R26.89);Muscle weakness (generalized) (M62.81)                Time: 1320-1340 OT Time Calculation (min): 20 min Charges:  OT General Charges $OT Visit: 1 Visit OT Evaluation $OT Eval Moderate Complexity: 1 Mod  Renato Spellman K, OTD, OTR/L SecureChat Preferred Acute Rehab [phone]  Renaye Rakers Koonce 09/23/2022, 1:53  PM

## 2022-09-24 DIAGNOSIS — E876 Hypokalemia: Secondary | ICD-10-CM | POA: Diagnosis not present

## 2022-09-24 DIAGNOSIS — R410 Disorientation, unspecified: Secondary | ICD-10-CM | POA: Diagnosis not present

## 2022-09-24 DIAGNOSIS — G9341 Metabolic encephalopathy: Secondary | ICD-10-CM | POA: Diagnosis not present

## 2022-09-24 DIAGNOSIS — R531 Weakness: Secondary | ICD-10-CM | POA: Diagnosis not present

## 2022-09-24 LAB — URINE CULTURE
Culture: 70000 — AB
Special Requests: NORMAL

## 2022-09-24 LAB — BASIC METABOLIC PANEL
Anion gap: 8 (ref 5–15)
BUN: 7 mg/dL — ABNORMAL LOW (ref 8–23)
CO2: 31 mmol/L (ref 22–32)
Calcium: 8.6 mg/dL — ABNORMAL LOW (ref 8.9–10.3)
Chloride: 105 mmol/L (ref 98–111)
Creatinine, Ser: 1.04 mg/dL — ABNORMAL HIGH (ref 0.44–1.00)
GFR, Estimated: 57 mL/min — ABNORMAL LOW (ref >60)
Glucose, Bld: 144 mg/dL — ABNORMAL HIGH (ref 70–99)
Potassium: 4.1 mmol/L (ref 3.5–5.1)
Sodium: 144 mmol/L (ref 135–145)

## 2022-09-24 NOTE — Progress Notes (Addendum)
[facility] PROGRESS NOTE    Progress Note  Jenny Cardenas  EQA:[id number] DOB: 03-Apr-1951 DOA: 09/22/2022 PCP: Dorna Mai, MD     Brief Narrative:   Jenny Cardenas is an 72 y.o. female past medical history significant for essential hypertension hypothyroidism, diabetes mellitus type 2, history of lung cancer status post right upper lobectomy, depression comes in after being noted by family that he started getting confused about a week prior to admission.  Patient care for her 26 year old mother, family reports that over the last 5 months her mental status is declining, she is also been getting weak and fallen, has not been started on any new medications, the family reports that he is she has not been taking her Adderall for over a year.  CT of the head showed no acute abnormalities along with x-ray.  MRI of the brain showed no acute abnormalities, just chronic ischemic changes.   Assessment/Plan:   Acute metabolic encephalopathy Patient mentation is improved this morning, she enjoyed walking with physical therapy. Physical therapy evaluated the patient recommended home health PT In the setting of diuretic use daily (supposed  as needed) using it every day.  Possible urinary tract infection: Urinary tract infection has been ruled out. Urine culture showed greater than 70,000 colonies of E. coli discontinue antibiotics he remained afebrile with no leukocytosis  Chronic kidney disease stage IIIa: Noted.  Metabolic alkalosis: Started on IV fluids alkalosis is improving. Continue oral hydration recheck in the morning.  Fall and weakness: Has fallen 5 times over the last 6 months. B12 264 will start repletion, TSH 1.7.  Continue B12 repletion. Orthostatics vitals are unremarkable PT evaluated patient and OT recommended home health PT  Hypokalemia: Resolved with oral repletion.  Essential hypertension: Sugars well-controlled off diuretics.  Hypothyroidism: Continue  Synthroid.  Anxiety and depression: Continue Zoloft and Xanax as needed.  Hyperlipidemia:  Continue statins.  Osteoporosis: Follow-up with PCP as an outpatient.  Prolonged QTc: Try to keep her potassium greater than 4 and magnesium greater than 2    DVT prophylaxis: lovenox Family Communication:none Status is: Inpatient Remains inpatient appropriate because: acute metabolic encaphalapthy    Code Status:     Code Status Orders  (From admission, onward)           Start     Ordered   09/22/22 1449  Full code  Continuous       Question:  By:  Answer:  Consent: discussion documented in EHR   09/22/22 1449           Code Status History     Date Active Date Inactive Code Status Order ID Comments User Context   06/29/2021 1701 07/02/2021 1803 Full Code [id number]  Arnoldo Lenis Inpatient   07/13/2019 0109 07/18/2019 1641 Full Code [id number]  [name], MD ED   10/01/2018 0900 10/04/2018 1450 Full Code [id number]  Bonnielee Haff, MD Inpatient         IV Access:   Peripheral IV   Procedures and diagnostic studies:   MR BRAIN WO CONTRAST  Result Date: 09/22/2022 CLINICAL DATA:  Initial evaluation for mental status change, unknown cause. EXAM: MRI HEAD WITHOUT CONTRAST TECHNIQUE: Multiplanar, multiecho pulse sequences of the brain and surrounding structures were obtained without intravenous contrast. COMPARISON:  CT from earlier the same day. FINDINGS: Brain: Cerebral volume within normal limits. Mild scattered patchy T2/FLAIR hyperintensity involving the periventricular deep white matter both cerebral hemispheres, most consistent with chronic small vessel ischemic disease,  mild for age. No evidence for acute or subacute ischemia. Gray-white matter differentiation maintained. No areas of chronic cortical infarction. No acute or chronic intracranial blood products. No mass lesion, midline shift or mass effect no hydrocephalus or extra-axial fluid  collection. Pituitary gland and suprasellar region within normal limits. Vascular: Major intracranial vascular flow voids are maintained. Skull and upper cervical spine: Craniocervical junction within normal limits. Bone marrow signal intensity normal. No scalp soft tissue abnormality. Sinuses/Orbits: Globes and orbital soft tissues demonstrate no acute finding. Prior bilateral ocular lens replacement. Paranasal sinuses are clear. No significant mastoid effusion. Other: None. IMPRESSION: 1. No acute intracranial abnormality. 2. Mild chronic microvascular ischemic disease for age. Electronically Signed   By: Jeannine Boga M.D.   On: 09/22/2022 23:08   CT Head Wo Contrast  Result Date: 09/22/2022 CLINICAL DATA:  Altered mental status EXAM: CT HEAD WITHOUT CONTRAST TECHNIQUE: Contiguous axial images were obtained from the base of the skull through the vertex without intravenous contrast. RADIATION DOSE REDUCTION: This exam was performed according to the departmental dose-optimization program which includes automated exposure control, adjustment of the mA and/or kV according to patient size and/or use of iterative reconstruction technique. COMPARISON:  07/12/2019 FINDINGS: Brain: No evidence of acute infarction, hemorrhage, hydrocephalus, extra-axial collection or mass lesion/mass effect. Vascular: No hyperdense vessel or unexpected calcification. Skull: Normal. Negative for fracture or focal lesion. Sinuses/Orbits: No acute finding. Other: None. IMPRESSION: No acute intracranial abnormality. Electronically Signed   By: Davina Poke D.O.   On: 09/22/2022 13:18   DG Pelvis 1-2 Views  Result Date: 09/22/2022 CLINICAL DATA:  Fall. EXAM: PELVIS - 1 VIEW COMPARISON:  None Available. FINDINGS: There is no evidence of pelvic fracture or diastasis. No pelvic bone lesions are seen. Bilateral hip degenerative changes identified with joint space narrowing and osteophytes. Lumbosacral degenerative changes with  sclerosis. IMPRESSION: Degenerative changes.  No acute osseous abnormalities. Electronically Signed   By: Sammie Bench M.D.   On: 09/22/2022 12:34   DG Chest 2 View  Result Date: 09/22/2022 CLINICAL DATA:  Altered mental status EXAM: CHEST - 2 VIEW COMPARISON:  Chest x-ray August 18, 2021 FINDINGS: The cardiomediastinal silhouette is unchanged in contour. No focal pulmonary opacity. No pleural effusion or pneumothorax. The visualized upper abdomen is unremarkable. Cement augmentation of multiple thoracic vertebral bodies. No acute osseous abnormality. IMPRESSION: No acute cardiopulmonary abnormality. Electronically Signed   By: Beryle Flock M.D.   On: 09/22/2022 12:32     Medical Consultants:   None.   Subjective:    Hubert Azure she feels better this morning.  Objective:    Vitals:   09/23/22 0821 09/23/22 1615 09/23/22 1926 09/24/22 0321  BP: (!) 122/48 102/88 118/61 110/61  Pulse: 69 75 80 77  Resp: 17 17 18 18   Temp: 98.4 F (36.9 C) 98.2 F (36.8 C) 98.2 F (36.8 C) 98.2 F (36.8 C)  TempSrc: Oral Oral Oral Oral  SpO2: 95% 95%  96%  Weight:      Height:       SpO2: 96 %   Intake/Output Summary (Last 24 hours) at 09/24/2022 0816 Last data filed at 09/23/2022 2357 Gross per 24 hour  Intake 1155.96 ml  Output --  Net 1155.96 ml    Filed Weights   09/22/22 1128  Weight: 74.8 kg    Exam: General exam: In no acute distress. Respiratory system: Good air movement and clear to auscultation. Cardiovascular system: S1 & S2 heard, RRR. No JVD. Gastrointestinal system:  Abdomen is nondistended, soft and nontender.  Extremities: No pedal edema. Skin: No rashes, lesions or ulcers Psychiatry: Judgement and insight appear normal. Mood & affect appropriate. Data Reviewed:    Labs: Basic Metabolic Panel: Recent Labs  Lab 09/22/22 1202 09/22/22 1210 09/22/22 1641 09/23/22 0245 09/24/22 0306  NA 137  --   --  142 144  K 2.3*  --    < > 2.9* 4.1  CL 90*   --   --  98 105  CO2 32  --   --  33* 31  GLUCOSE 148*  --   --  129* 144*  BUN 9  --   --  8 7*  CREATININE 1.10*  --   --  1.12* 1.04*  CALCIUM 9.1  --   --  8.6* 8.6*  MG  --  1.8  --   --   --    < > = values in this interval not displayed.    GFR Estimated Creatinine Clearance: 47.4 mL/min (A) (by C-G formula based on SCr of 1.04 mg/dL (H)). Liver Function Tests: Recent Labs  Lab 09/22/22 1202  AST 24  ALT 17  ALKPHOS 95  BILITOT 0.8  PROT 7.3  ALBUMIN 3.9    No results for input(s): "LIPASE", "AMYLASE" in the last 168 hours. No results for input(s): "AMMONIA" in the last 168 hours. Coagulation profile No results for input(s): "INR", "PROTIME" in the last 168 hours. COVID-19 Labs  No results for input(s): "DDIMER", "FERRITIN", "LDH", "CRP" in the last 72 hours.  Lab Results  Component Value Date   SARSCOV2NAA NEGATIVE 06/27/2021   [place] NEGATIVE 07/17/2019   [place] NEGATIVE 07/13/2019    CBC: Recent Labs  Lab 09/22/22 1202 09/23/22 0245  WBC 9.5 6.9  NEUTROABS 6.4  --   HGB 14.6 12.1  HCT 44.1 36.4  MCV 82.7 82.7  PLT 265 266    Cardiac Enzymes: Recent Labs  Lab 09/23/22 0245  CKTOTAL 36*    BNP (last 3 results) No results for input(s): "PROBNP" in the last 8760 hours. CBG: No results for input(s): "GLUCAP" in the last 168 hours. D-Dimer: No results for input(s): "DDIMER" in the last 72 hours. Hgb A1c: No results for input(s): "HGBA1C" in the last 72 hours. Lipid Profile: No results for input(s): "CHOL", "HDL", "LDLCALC", "TRIG", "CHOLHDL", "LDLDIRECT" in the last 72 hours. Thyroid function studies: Recent Labs    09/22/22 1641  TSH 1.762    Anemia work up: Recent Labs    09/22/22 1200  VITAMINB12 264    Sepsis Labs: Recent Labs  Lab 09/22/22 1202 09/23/22 0245  WBC 9.5 6.9    Microbiology Recent Results (from the past 240 hour(s))  Urine Culture (for pregnant, neutropenic or urologic patients or patients  with an indwelling urinary catheter)     Status: Abnormal   Collection Time: 09/22/22 12:08 PM   Specimen: Urine, Clean Catch  Result Value Ref Range Status   Specimen Description URINE, CLEAN CATCH  Final   Special Requests   Final    Normal Performed at [facility], [place] [address] [place], [place] [address]    Culture (A)  Final    70,000 COLONIES/mL ESCHERICHIA COLI Confirmed Extended Spectrum Beta-Lactamase Producer (ESBL).  In bloodstream infections from ESBL organisms, carbapenems are preferred over piperacillin/tazobactam. They are shown to have a lower risk of mortality.    Report Status 09/24/2022 FINAL  Final   Organism ID, Bacteria ESCHERICHIA COLI (A)  Final      Susceptibility   Escherichia coli - MIC*    AMPICILLIN >=32 RESISTANT Resistant     CEFAZOLIN >=64 RESISTANT Resistant     CEFEPIME 2 SENSITIVE Sensitive     CEFTRIAXONE >=64 RESISTANT Resistant     CIPROFLOXACIN >=4 RESISTANT Resistant     GENTAMICIN >=16 RESISTANT Resistant     IMIPENEM <=0.25 SENSITIVE Sensitive     NITROFURANTOIN <=16 SENSITIVE Sensitive     TRIMETH/SULFA >=320 RESISTANT Resistant     AMPICILLIN/SULBACTAM 4 SENSITIVE Sensitive     PIP/TAZO <=4 SENSITIVE Sensitive     * 70,000 COLONIES/mL ESCHERICHIA COLI     Medications:    buPROPion  150 mg Oral Daily   cyanocobalamin  1,000 mcg Subcutaneous Daily   enoxaparin (LOVENOX) injection  40 mg Subcutaneous Q24H   levothyroxine  25 mcg Oral QAC breakfast   pravastatin  10 mg Oral QHS   sertraline  100 mg Oral Daily   sodium chloride flush  3 mL Intravenous Q12H   traZODone  50 mg Oral QHS   Continuous Infusions:  cefTRIAXone (ROCEPHIN)  IV Stopped (09/23/22 1714)      LOS: 2 days   Charlynne Cousins  [facility]  09/24/2022, 8:16 AM

## 2022-09-24 NOTE — Progress Notes (Signed)
Physical Therapy Treatment Patient Details Name: Jenny Cardenas MRN: [id number] DOB: Dec 06, 1950 Today's Date: 09/24/2022   History of Present Illness Patient is a 72 y/o female who presents on 2/10 with AMS, confusion, weakness and falls. CT negative for acute abnormailities. Found to have acute metabolic encephalopathy and possible UTI. PMH includes DM, ADHD, CKD, depression, HTN, lung ca status post right upper lobectomy, PTSD.    PT Comments    Pt received in bed, in good spirits with mild confusion. She required supervision bed mobility, min guard assist transfers, min guard assist amb 100' with RW, and min assist ascend/descend 4 steps with R rail. Pt progressing well with mobility. Pt in recliner at end of session with feet elevated.    Recommendations for follow up therapy are one component of a multi-disciplinary discharge planning process, led by the attending physician.  Recommendations may be updated based on patient status, additional functional criteria and insurance authorization.  Follow Up Recommendations  Home health PT     Assistance Recommended at Discharge Intermittent Supervision/Assistance  Patient can return home with the following A little help with walking and/or transfers;A little help with bathing/dressing/bathroom;Assistance with cooking/housework;Help with stairs or ramp for entrance;Direct supervision/assist for financial management;Direct supervision/assist for medications management;Assist for transportation   Equipment Recommendations  Rolling walker (2 wheels)    Recommendations for Other Services       Precautions / Restrictions Precautions Precautions: Fall     Mobility  Bed Mobility Overal bed mobility: Needs Assistance Bed Mobility: Supine to Sit     Supine to sit: Supervision     General bed mobility comments: +rail, increased time    Transfers Overall transfer level: Needs assistance Equipment used: Rolling walker (2  wheels) Transfers: Sit to/from Stand Sit to Stand: Min guard           General transfer comment: increased time to power up    Ambulation/Gait Ambulation/Gait assistance: Min guard Gait Distance (Feet): 100 Feet Assistive device: Rolling walker (2 wheels) Gait Pattern/deviations: Step-through pattern, Decreased stride length Gait velocity: decreased Gait velocity interpretation: <1.31 ft/sec, indicative of household ambulator   General Gait Details: slow, steady gait with RW   Stairs Stairs: Yes Stairs assistance: Min assist Stair Management: One rail Right, Sideways Number of Stairs: 4 General stair comments: cues for sequencing, assist for safety   Wheelchair Mobility    Modified Rankin (Stroke Patients Only)       Balance Overall balance assessment: Needs assistance Sitting-balance support: Feet supported, No upper extremity supported Sitting balance-Leahy Scale: Good     Standing balance support: During functional activity, Bilateral upper extremity supported, No upper extremity supported Standing balance-Leahy Scale: Fair Standing balance comment: RW for amb                            Cognition Arousal/Alertness: Awake/alert Behavior During Therapy: WFL for tasks assessed/performed Overall Cognitive Status: Impaired/Different from baseline Area of Impairment: Memory, Problem solving                     Memory: Decreased short-term memory       Problem Solving: Difficulty sequencing, Requires verbal cues, Slow processing General Comments: Tangential. Pleasantly confused.        Exercises      General Comments        Pertinent Vitals/Pain Pain Assessment Pain Assessment: Faces Faces Pain Scale: Hurts a little bit Pain Location: LUE during gait with  RW Pain Descriptors / Indicators: Sore Pain Intervention(s): Monitored during session, Limited activity within patient's tolerance    Home Living                           Prior Function            PT Goals (current goals can now be found in the care plan section) Acute Rehab PT Goals Patient Stated Goal: home Progress towards PT goals: Progressing toward goals    Frequency    Min 3X/week      PT Plan Current plan remains appropriate    Co-evaluation              AM-PAC PT "6 Clicks" Mobility   Outcome Measure  Help needed turning from your back to your side while in a flat bed without using bedrails?: None Help needed moving from lying on your back to sitting on the side of a flat bed without using bedrails?: A Little Help needed moving to and from a bed to a chair (including a wheelchair)?: A Little Help needed standing up from a chair using your arms (e.g., wheelchair or bedside chair)?: A Little Help needed to walk in hospital room?: A Little Help needed climbing 3-5 steps with a railing? : A Little 6 Click Score: 19    End of Session Equipment Utilized During Treatment: Gait belt Activity Tolerance: Patient tolerated treatment well Patient left: in chair;with call bell/phone within reach;with chair alarm set Nurse Communication: Mobility status PT Visit Diagnosis: Unsteadiness on feet (R26.81);Muscle weakness (generalized) (M62.81);Difficulty in walking, not elsewhere classified (R26.2);Dizziness and giddiness (R42)     Time: 0175-1025 PT Time Calculation (min) (ACUTE ONLY): 23 min  Charges:  $Gait Training: 23-37 mins                     Gloriann Loan., PT  Office # [phone]    Lorriane Shire 09/24/2022, 8:39 AM

## 2022-09-24 NOTE — Progress Notes (Signed)
Mobility Specialist Progress Note    09/24/22 1116  Mobility  Activity Ambulated with assistance to bathroom;Ambulated with assistance in hallway  Level of Assistance Contact guard assist, steadying assist  Assistive Device Front wheel walker  Distance Ambulated (ft) 120 ft (10+110)  Activity Response Tolerated well  Mobility Referral Yes  $Mobility charge 1 Mobility   Pt received getting up from EOB to use BR. C/o headache. Returned to sitting EOB with call bell in reach.   Hildred Alamin Mobility Specialist  Please Psychologist, sport and exercise or Rehab Office at [phone]

## 2022-09-24 NOTE — TOC Initial Note (Signed)
Transition of Care (TOC) - Initial/Assessment Note   Spoke to patient at bedside and daughter Nira Conn via phone. Anticipated discharge date 09/25/22.    Patient lives with her mother and takes care of her. Currently her sister is staying with her mother. They lost power during the rain but it is back on now.   Discussed HHPT , patient and daughter in agreement.   Left message with Amy with [facility].   Ordered walker with Erasmo Downer with [place]  Patient Details  Name: Jenny Cardenas MRN: [id number] Date of Birth: 1950/11/27  Transition of Care [facility]) CM/SW Contact:    Marilu Favre, RN Phone Number: 09/24/2022, 2:49 PM  Clinical Narrative:                   Expected Discharge Plan: [place]     Patient Goals and CMS Choice Patient states their goals for this hospitalization and ongoing recovery are:: to return to home CMS Medicare.gov Compare Post Acute Care list provided to:: Patient Choice offered to / list presented to : Patient [place] ownership interest in [facility].provided to:: Patient    Expected Discharge Plan and Services   Discharge Planning Services: CM Consult Post Acute Care Choice: [place] arrangements for the past 2 months: Single Family Home                 DME Arranged: Walker rolling DME Agency: [employer] Date DME Agency Contacted: 09/24/22 Time DME Agency Contacted: 1440 Representative spoke with at DME Agency: Erasmo Downer HH Arranged: PT [place]: [place] Date [place]: 09/24/22 Time [place]: 1440 Representative spoke with at [place]: Amy  Prior Living Arrangements/Services Living arrangements for the past 2 months: [place] with:: Parents Patient language and need for interpreter reviewed:: Yes Do you feel safe going back to the place where you live?: Yes      Need for Family Participation in Patient Care: Yes (Comment) Care giver support  system in place?: Yes (comment)   Criminal Activity/Legal Involvement Pertinent to Current Situation/Hospitalization: No - Comment as needed  Activities of Daily Living Home Assistive Devices/Equipment: Eyeglasses    Permission Sought/Granted   Permission granted to share information with : Yes, Verbal Permission Granted  Share Information with NAME: daughters Nira Conn and [place]           Emotional Assessment Appearance:: Appears stated age Attitude/Demeanor/Rapport: Engaged Affect (typically observed): Accepting Orientation: : Oriented to Self, Oriented to Place, Oriented to  Time, Oriented to Situation Alcohol / Substance Use: Not Applicable Psych Involvement: No (comment)  Admission diagnosis:  Hypokalemia [E87.6] Confusion [R41.0] UTI (urinary tract infection) [N39.0] Weakness [R53.1] Patient Active Problem List   Diagnosis Date Noted   UTI (urinary tract infection) 09/22/2022   Hypokalemia 16/08/930   Acute metabolic encephalopathy 35/57/3220   Weakness 09/22/2022   Osteoporosis 09/22/2022   Prolonged QT interval 09/22/2022   Primary adenocarcinoma of upper lobe of right lung ([place]) 09/19/2021   Right lower lobe pulmonary nodule 06/29/2021   Pulmonary nodule 1 cm or greater in diameter 04/07/2021   Calcaneal fracture 07/14/2019   Fall 07/13/2019   Diabetes mellitus type 2 in obese ([place]) 07/13/2019   Closed right calcaneal fracture 07/13/2019   Acute pyelonephritis 10/01/2018   Acute diverticulitis 10/01/2018   Primary osteoarthritis of both hands 04/26/2017   History of vertebral fracture s/p kyphoplasty  04/26/2017   Hyperthyroidism 09/28/2013   Multinodular goiter  09/28/2013   Rash and nonspecific skin eruption 01/31/2013   Rash and other nonspecific skin eruption 10/16/2010   DIAB W/OTH MANIFESTS TYPE II/UNS TYPE UNCNTRL 04/10/2010   OSTEOPENIA 08/27/2008   UNS ADVRS EFF UNS RX MEDICINAL&BIOLOGICAL SBSTNC 09/18/2007   ACTINIC KERATOSIS 03/04/2007    Anxiety and depression 02/13/2007   Hyperlipidemia 12/25/2006   OBESITY 12/25/2006   Essential hypertension 12/25/2006   PCP:  Dorna Mai, MD Pharmacy:   [id number] - [place], [address] AT [address] [place] [address] Phone: [phone] Fax: [phone]     Social Determinants of Health (SDOH) Social History: SDOH Screenings   Food Insecurity: No Food Insecurity (09/22/2022)  Housing: Low Risk  (09/22/2022)  Transportation Needs: No Transportation Needs (09/22/2022)  Utilities: Not At Risk (09/22/2022)  Alcohol Screen: Low Risk  (05/18/2022)  Depression (PHQ2-9): High Risk (05/18/2022)  Financial Resource Strain: Low Risk  (05/18/2022)  Physical Activity: Insufficiently Active (05/18/2022)  Social Connections: Moderately Isolated (05/18/2022)  Stress: Stress Concern Present (05/18/2022)  Tobacco Use: Medium Risk (09/22/2022)   SDOH Interventions:     Readmission Risk Interventions     No data to display

## 2022-09-25 DIAGNOSIS — R600 Localized edema: Secondary | ICD-10-CM

## 2022-09-25 DIAGNOSIS — G9341 Metabolic encephalopathy: Secondary | ICD-10-CM | POA: Diagnosis not present

## 2022-09-25 DIAGNOSIS — E876 Hypokalemia: Secondary | ICD-10-CM | POA: Diagnosis not present

## 2022-09-25 DIAGNOSIS — R410 Disorientation, unspecified: Secondary | ICD-10-CM | POA: Diagnosis not present

## 2022-09-25 LAB — BASIC METABOLIC PANEL
Anion gap: 5 (ref 5–15)
BUN: 8 mg/dL (ref 8–23)
CO2: 29 mmol/L (ref 22–32)
Calcium: 8.5 mg/dL — ABNORMAL LOW (ref 8.9–10.3)
Chloride: 106 mmol/L (ref 98–111)
Creatinine, Ser: 1.11 mg/dL — ABNORMAL HIGH (ref 0.44–1.00)
GFR, Estimated: 53 mL/min — ABNORMAL LOW (ref >60)
Glucose, Bld: 111 mg/dL — ABNORMAL HIGH (ref 70–99)
Potassium: 4.1 mmol/L (ref 3.5–5.1)
Sodium: 140 mmol/L (ref 135–145)

## 2022-09-25 MED ORDER — ALBUTEROL SULFATE HFA 108 (90 BASE) MCG/ACT IN AERS: 1.0000 | INHALATION_SPRAY | Freq: Four times a day (QID) | RESPIRATORY_TRACT | 0 refills | Status: DC | PRN

## 2022-09-25 MED ORDER — FUROSEMIDE 20 MG PO TABS: 20.0000 mg | ORAL_TABLET | Freq: Every day | ORAL | 1 refills | Status: DC | PRN

## 2022-09-25 NOTE — Progress Notes (Signed)
Occupational Therapy Treatment Patient Details Name: Jenny Cardenas MRN: [id number] DOB: 04/25/1951 Today's Date: 09/25/2022   History of present illness Patient is a 72 y/o female who presents on 2/10 with AMS, confusion, weakness and falls. CT negative for acute abnormailities. Found to have acute metabolic encephalopathy and possible UTI. PMH includes DM, ADHD, CKD, depression, HTN, lung ca status post right upper lobectomy, PTSD.   OT comments  Pt with improved ability to perform LB ADL this session, min A for LB dressing in preparation to go home. Then Pt agreeable to sink level grooming completed at min guard. Pt tangential, pleasant, but remains at high risk of falls- twice during session she turned suddenly with LOB that OT had to assist with. Pt and daughter educated on letting Pt get inside and sit before letting dogs out so that she is not tripped by them. 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    Recommendations for follow up therapy are one component of a multi-disciplinary discharge planning process, led by the attending physician.  Recommendations may be updated based on patient status, additional functional criteria and insurance authorization.    Follow Up Recommendations  Home health OT     Assistance Recommended at Discharge Intermittent Supervision/Assistance  Patient can return home with the following  A little help with walking and/or transfers;A lot of help with bathing/dressing/bathroom;Assistance with cooking/housework;Direct supervision/assist for medications management;Direct supervision/assist for financial management;Assist for transportation;Help with stairs or ramp for entrance   Equipment Recommendations  Other (comment) (RW)    Recommendations for Other Services PT consult    Precautions / Restrictions  Precautions Precautions: Fall Restrictions Weight Bearing Restrictions: No       Mobility Bed Mobility               General bed mobility comments: OOB in recliner when OT entered the room    Transfers Overall transfer level: Needs assistance Equipment used: Rolling walker (2 wheels) Transfers: Sit to/from Stand Sit to Stand: Min guard           General transfer comment: increased time to power up     Balance Overall balance assessment: Needs assistance Sitting-balance support: Feet supported, No upper extremity supported Sitting balance-Leahy Scale: Good Sitting balance - Comments: can reach outside BOS without LOB   Standing balance support: During functional activity, Bilateral upper extremity supported, No upper extremity supported Standing balance-Leahy Scale: Fair Standing balance comment: RW for amb                           ADL either performed or assessed with clinical judgement   ADL Overall ADL's : Needs assistance/impaired     Grooming: Min guard;Oral care;Wash/dry face;Standing Grooming Details (indicate cue type and reason): sink level         Upper Body Dressing : Minimal assistance;Sitting   Lower Body Dressing: Minimal assistance;Sit to/from stand   Toilet Transfer: Min guard;Ambulation;Regular Toilet;Rolling walker (2 wheels) Toilet Transfer Details (indicate cue type and reason): several LOB backwards with directional changes Toileting- Clothing Manipulation and Hygiene: Min guard;Sit to/from stand       Functional mobility during ADLs: Min guard;Rolling walker (2 wheels) General ADL Comments: increased time required, cues for task completion (cognition remains impacted)    Extremity/Trunk Assessment Upper Extremity Assessment Upper Extremity Assessment: Overall  WFL for tasks assessed            Vision   Vision Assessment?: No apparent visual deficits   Perception     Praxis      Cognition  Arousal/Alertness: Awake/alert Behavior During Therapy: WFL for tasks assessed/performed Overall Cognitive Status: Impaired/Different from baseline Area of Impairment: Memory, Problem solving                     Memory: Decreased short-term memory       Problem Solving: Difficulty sequencing, Requires verbal cues, Slow processing General Comments: Tangential. Pleasantly confused. UNaware of several LOB backwards        Exercises      Shoulder Instructions       General Comments could not figure out how to work iphone to find photos of her dogs, assisted and educated. Pt stated "I have a masters of hospital administration how can I not figure this out?"    Pertinent Vitals/ Pain       Pain Assessment Pain Assessment: No/denies pain Pain Intervention(s): Monitored during session, Repositioned  Home Living                                          Prior Functioning/Environment              Frequency  Min 2X/week        Progress Toward Goals  OT Goals(current goals can now be found in the care plan section)  Progress towards OT goals: Progressing toward goals  Acute Rehab OT Goals Patient Stated Goal: get home to her mom and dogs OT Goal Formulation: With patient Time For Goal Achievement: 10/07/22 Potential to Achieve Goals: Good  Plan Discharge plan remains appropriate    Co-evaluation                 AM-PAC OT "6 Clicks" Daily Activity     Outcome Measure   Help from another person eating meals?: None Help from another person taking care of personal grooming?: A Little Help from another person toileting, which includes using toliet, bedpan, or urinal?: A Little Help from another person bathing (including washing, rinsing, drying)?: A Little Help from another person to put on and taking off regular upper body clothing?: A Little Help from another person to put on and taking off regular lower body clothing?: A Little 6  Click Score: 19    End of Session Equipment Utilized During Treatment: Gait belt;Rolling walker (2 wheels)  OT Visit Diagnosis: Unsteadiness on feet (R26.81);Other abnormalities of gait and mobility (R26.89);Muscle weakness (generalized) (M62.81)   Activity Tolerance Patient tolerated treatment well   Patient Left Seated EOB with daughter present and RN in room for discharge paperwork   Nurse Communication Mobility status (daughter in room and ready for discharge)        Time: 1010-1031 OT Time Calculation (min): 21 min  Charges: OT General Charges $OT Visit: 1 Visit OT Treatments $Self Care/Home Management : 8-22 mins  Jesse Sans OTR/L Acute Rehabilitation Services Office: [place] 09/25/2022, 10:38 AM

## 2022-09-25 NOTE — Progress Notes (Signed)
Patient is being discharged home. Patients discharge instructions including changes to medications reviewed with patient and her daughter Earnest Bailey. Patient and daughter verbalized a full understanding of instructions. Patient daughter is her ride home.

## 2022-09-25 NOTE — Care Management Important Message (Signed)
Important Message  Patient Details  Name: Jenny Cardenas MRN: [id number] Date of Birth: 09/24/1950   Medicare Important Message Given:  Yes     Hannah Beat 09/25/2022, 2:15 PM

## 2022-09-25 NOTE — Discharge Summary (Signed)
Physician Discharge Summary  Jenny Cardenas UDJ:[id number] DOB: 1951-04-03 DOA: 09/22/2022  PCP: Dorna Mai, MD  Admit date: 09/22/2022 Discharge date: 09/25/2022  Admitted From: Home Disposition:  Home  Recommendations for Outpatient Follow-up:  Follow up with PCP in 1-2 weeks, check blood pressure and titrate medications as tolerated Please obtain BMP/CBC in one week   Home Health:Yes Equipment/Devices:None  Discharge Condition:Stable CODE STATUS:Full Diet recommendation: Heart Healthy Brief/Interim Summary: 72 y.o. female past medical history significant for essential hypertension hypothyroidism, diabetes mellitus type 2, history of lung cancer status post right upper lobectomy, depression comes in after being noted by family that he started getting confused about a week prior to admission.  Patient care for her 3 year old mother, family reports that over the last 5 months her mental status is declining, she is also been getting weak and fallen, has not been started on any new medications, the family reports that he is she has not been taking her Adderall for over a year.  CT of the head showed no acute abnormalities along with x-ray.  MRI of the brain showed no acute abnormalities, just chronic ischemic changes.   Discharge Diagnoses:  Principal Problem:   Acute metabolic encephalopathy Active Problems:   UTI (urinary tract infection)   Fall   Weakness   Hypokalemia   Essential hypertension   Hyperthyroidism   Anxiety and depression   Hyperlipidemia   Osteoporosis   Prolonged QT interval  Acute metabolic encephalopathy: In the setting of daily use of diuretics, the patient was possibly on Lasix as needed but she was taking it daily. It was stopped she was hydrated and her encephalopathy resolved. Physical therapy evaluated the patient she will need home health as needed. Urinary tract infection has been ruled out.  Chronic kidney stage IIIa: Noted.  Metabolic  alkalosis: Likely due to overdiuresis is resolved with IV fluid resuscitation.  Fall and weakness: Likely multifactorial due to dehydration she was hydrated physical therapy evaluated the patient recommended home health PT. B12 was 200 she was started on IV repletion, TSH was 1.7. Orthostatic vitals were negative.  Hypokalemia: Likely due to Lasix this was repleted now resolved.  Essential hypertension: Blood pressure is well-controlled off diuretics she will need to follow-up with PCP as an outpatient.  Hypothyroidism: Continue Synthroid.  Anxiety/depression: Continue Celexa and Xanax.  Hyperlipidemia Continue statins.  Osteoporosis: Follow-up with PCP.  Prolonged QTc: Likely due to hypokalemia and was repleted, we try to keep potassium greater than 4 and magnesium greater than 2  Discharge Instructions  Discharge Instructions     Diet - low sodium heart healthy   Complete by: As directed    Increase activity slowly   Complete by: As directed       Allergies as of 09/25/2022       Reactions   Ethylene Oxide    Other reaction(s): scarred cornea   Other    Etholine Oxide: causes itching and hives, and temporary blindness        Medication List     TAKE these medications    albuterol 108 (90 Base) MCG/ACT inhaler Commonly known as: VENTOLIN HFA Inhale 1-2 puffs into the lungs every 6 (six) hours as needed for wheezing or shortness of breath. This is a one-time refill from me until the patient establish care with her primary care physician.  Please do not send request for refill to me in the future.   albuterol (2.5 MG/3ML) 0.083% nebulizer solution Commonly known as: PROVENTIL Take 3  mLs (2.5 mg total) by nebulization every 4 (four) hours as needed for wheezing or shortness of breath.   ALPRAZolam 0.5 MG tablet Commonly known as: XANAX Take 1 tablet (0.5 mg total) by mouth 3 (three) times daily as needed for anxiety.   amphetamine-dextroamphetamine 10  MG tablet Commonly known as: ADDERALL Take 10 mg by mouth 2 (two) times daily.   buPROPion 150 MG 24 hr tablet Commonly known as: WELLBUTRIN XL Take 1 tablet (150 mg total) by mouth daily.   diphenhydramine-acetaminophen 25-500 MG Tabs tablet Commonly known as: TYLENOL PM Take 2 tablets by mouth at bedtime as needed.   furosemide 20 MG tablet Commonly known as: LASIX Take 1 tablet (20 mg total) by mouth daily as needed. May take an additional dose if needed for swelling What changed:  when to take this reasons to take this   guaiFENesin 600 MG 12 hr tablet Commonly known as: MUCINEX Take 1 tablet (600 mg total) by mouth 2 (two) times daily. What changed:  when to take this reasons to take this   ibuprofen 200 MG tablet Commonly known as: ADVIL Take 200 mg by mouth as needed.   levocetirizine 5 MG tablet Commonly known as: XYZAL Take 5 mg by mouth daily as needed for allergies.   levothyroxine 25 MCG tablet Commonly known as: SYNTHROID Take 1 tablet (25 mcg total) by mouth daily before breakfast.   pantoprazole 40 MG tablet Commonly known as: PROTONIX Take 1 tablet (40 mg total) by mouth daily.   pravastatin 10 MG tablet Commonly known as: PRAVACHOL Take 1 tablet (10 mg total) by mouth daily.   PRESCRIPTION MEDICATION Natrol memory complex daily   PRESERVISION AREDS 2 PO Take 1 capsule by mouth in the morning and at bedtime.   PROBIOTIC DAILY PO Take by mouth daily.   RSV vaccine recomb adjuvanted 120 MCG/0.5ML injection Commonly known as: AREXVY Inject into the muscle.   sertraline 100 MG tablet Commonly known as: ZOLOFT Take 1 tablet (100 mg total) by mouth daily.   traMADol 50 MG tablet Commonly known as: ULTRAM Take 1 tablet (50 mg total) by mouth every 6 (six) hours as needed (mild pain).   traZODone 50 MG tablet Commonly known as: DESYREL Take 50 mg by mouth at bedtime.               Durable Medical Equipment  (From admission,  onward)           Start     Ordered   09/24/22 1432  For home use only DME Walker rolling  Once       Question Answer Comment  Walker: With 5 Inch Wheels   Patient needs a walker to treat with the following condition Weakness      09/24/22 1431            Follow-up Information     [facility] Follow up.   Specialty: Home Health Services Why: [phone] Contact information: [place] [address] [phone]                Allergies  Allergen Reactions   Ethylene Oxide     Other reaction(s): scarred cornea   Other     Etholine Oxide: causes itching and hives, and temporary blindness    Consultations: None   Procedures/Studies: MR BRAIN WO CONTRAST  Result Date: 09/22/2022 CLINICAL DATA:  Initial evaluation for mental status change, unknown cause. EXAM: MRI HEAD WITHOUT CONTRAST TECHNIQUE: Multiplanar,  multiecho pulse sequences of the brain and surrounding structures were obtained without intravenous contrast. COMPARISON:  CT from earlier the same day. FINDINGS: Brain: Cerebral volume within normal limits. Mild scattered patchy T2/FLAIR hyperintensity involving the periventricular deep white matter both cerebral hemispheres, most consistent with chronic small vessel ischemic disease, mild for age. No evidence for acute or subacute ischemia. Gray-white matter differentiation maintained. No areas of chronic cortical infarction. No acute or chronic intracranial blood products. No mass lesion, midline shift or mass effect no hydrocephalus or extra-axial fluid collection. Pituitary gland and suprasellar region within normal limits. Vascular: Major intracranial vascular flow voids are maintained. Skull and upper cervical spine: Craniocervical junction within normal limits. Bone marrow signal intensity normal. No scalp soft tissue abnormality. Sinuses/Orbits: Globes and orbital soft tissues demonstrate no acute finding. Prior bilateral ocular lens  replacement. Paranasal sinuses are clear. No significant mastoid effusion. Other: None. IMPRESSION: 1. No acute intracranial abnormality. 2. Mild chronic microvascular ischemic disease for age. Electronically Signed   By: Jeannine Boga M.D.   On: 09/22/2022 23:08   CT Head Wo Contrast  Result Date: 09/22/2022 CLINICAL DATA:  Altered mental status EXAM: CT HEAD WITHOUT CONTRAST TECHNIQUE: Contiguous axial images were obtained from the base of the skull through the vertex without intravenous contrast. RADIATION DOSE REDUCTION: This exam was performed according to the departmental dose-optimization program which includes automated exposure control, adjustment of the mA and/or kV according to patient size and/or use of iterative reconstruction technique. COMPARISON:  07/12/2019 FINDINGS: Brain: No evidence of acute infarction, hemorrhage, hydrocephalus, extra-axial collection or mass lesion/mass effect. Vascular: No hyperdense vessel or unexpected calcification. Skull: Normal. Negative for fracture or focal lesion. Sinuses/Orbits: No acute finding. Other: None. IMPRESSION: No acute intracranial abnormality. Electronically Signed   By: Davina Poke D.O.   On: 09/22/2022 13:18   DG Pelvis 1-2 Views  Result Date: 09/22/2022 CLINICAL DATA:  Fall. EXAM: PELVIS - 1 VIEW COMPARISON:  None Available. FINDINGS: There is no evidence of pelvic fracture or diastasis. No pelvic bone lesions are seen. Bilateral hip degenerative changes identified with joint space narrowing and osteophytes. Lumbosacral degenerative changes with sclerosis. IMPRESSION: Degenerative changes.  No acute osseous abnormalities. Electronically Signed   By: Sammie Bench M.D.   On: 09/22/2022 12:34   DG Chest 2 View  Result Date: 09/22/2022 CLINICAL DATA:  Altered mental status EXAM: CHEST - 2 VIEW COMPARISON:  Chest x-ray August 18, 2021 FINDINGS: The cardiomediastinal silhouette is unchanged in contour. No focal pulmonary opacity.  No pleural effusion or pneumothorax. The visualized upper abdomen is unremarkable. Cement augmentation of multiple thoracic vertebral bodies. No acute osseous abnormality. IMPRESSION: No acute cardiopulmonary abnormality. Electronically Signed   By: Beryle Flock M.D.   On: 09/22/2022 12:32   DG Bone Density  Result Date: 09/19/2022 EXAM: DUAL X-RAY ABSORPTIOMETRY (DXA) FOR BONE MINERAL DENSITY IMPRESSION: Referring Physician:  AMY E [place] Your patient completed a bone mineral density test using GE Lunar iDXA system (analysis version: 16). Technologist: [email or web address] PATIENT: Name: Jenny Cardenas, Jenny Cardenas Patient ID: [id number] Birth Date: 01/21/1951 Height: 61.0 in. Sex: Female Measured: 09/19/2022 Weight: 167.4 lbs. Indications: Advanced Age, Bilateral Ovariectomy (65.51), Caucasian, Depression, Diabetic non insulin, Estrogen Deficient, History of Fracture (Adult) (V15.51), Hyperthyroid (242.9), Hysterectomy, Levothyroxine, Postmenopausal, Wellbutrin, Zoloft Fractures: Calcaneus Treatments: None ASSESSMENT: The BMD measured at Forearm Radius 33% is 0.659 g/cm2 with a T-score of -2.5. This patient is considered osteoporotic according to [facility]) criteria. The quality of the exam is good.  L3 was excluded due to degenerative changes. Site Region Measured Date Measured Age [age]A BMD Significant CHANGE T-score Left Forearm Radius 33% 09/19/2022 72.5 -2.4 0.659 g/cm2 AP Spine L1-L4 (L3) 09/19/2022 72.5 0.3 1.218 g/cm2 DualFemur Neck Left 09/19/2022 72.5 -1.8 0.794 g/cm2 DualFemur Total Mean 09/19/2022 72.5 -0.5 0.948 g/cm2 World Health Organization [facility]) criteria for post-menopausal, Caucasian Women: Normal       T-score at or above -1 SD Osteopenia   T-score between -1 and -2.5 SD Osteoporosis T-score at or below -2.5 SD RECOMMENDATION: 1. All patients should optimize calcium and vitamin D intake. 2. Consider FDA-approved medical therapies in postmenopausal women and men aged 44 years and older, based on the  following: a. A hip or vertebral (clinical or morphometric) fracture. b. T-score = -2.5 at the femoral neck or spine after appropriate evaluation to exclude secondary causes. c. Low bone mass (T-score between -1.0 and -2.5 at the femoral neck or spine) and a 10-year probability of a hip fracture = 3% or a 10-year probability of a major osteoporosis-related fracture = 20% based on the [place]-adapted WHO algorithm. d. Clinician judgment and/or patient preferences may indicate treatment for people with 10-year fracture probabilities above or below these levels. FOLLOW-UP: Patients with diagnosis of osteoporosis or at high risk for fracture should have regular bone mineral density tests. Patients eligible for Medicare are allowed routine testing every 2 years. The testing frequency can be increased to one year for patients who have rapidly progressing disease, are receiving or discontinuing medical therapy to restore bone mass, or have additional risk factors. I have reviewed this study and agree with the findings. [facility] Electronically Signed   By: Ammie Ferrier M.D.   On: 09/19/2022 14:14   OCT, Retina - OU - Both Eyes  Result Date: 08/31/2022 Right Eye Quality was good. Central Foveal Thickness: 286. Progression has been stable. Findings include normal foveal contour, no IRF, no SRF, retinal drusen (Rare drusen nasal macula / peripapillary). Left Eye Quality was good. Central Foveal Thickness: 286. Progression has been stable. Findings include normal foveal contour, no IRF, no SRF, retinal drusen (Rare, focal drusen). 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 CME - cystoid macular edema. PED - pigment epithelial detachment. IRF - intraretinal fluid. SRF - subretinal fluid. EZ - ellipsoid zone. ERM - epiretinal membrane. ORA - outer  retinal atrophy. ORT - outer retinal tubulation. SRHM - subretinal hyper-reflective material. IRHM - intraretinal hyper-reflective material   (Echo, Carotid, EGD, Colonoscopy, ERCP)    Subjective: No complaints  Discharge Exam: Vitals:   09/24/22 2006 09/25/22 0449  BP: (!) 115/56 105/60  Pulse: 70 65  Resp: 17 16  Temp: 98.6 F (37 C) 98.2 F (36.8 C)  SpO2: 97% 98%   Vitals:   09/24/22 0935 09/24/22 1622 09/24/22 2006 09/25/22 0449  BP: 124/60 (!) 114/56 (!) 115/56 105/60  Pulse: 72 73 70 65  Resp: 18 18 17 16   Temp: 98.9 F (37.2 C) 99 F (37.2 C) 98.6 F (37 C) 98.2 F (36.8 C)  TempSrc: Oral Oral Oral Oral  SpO2: 98% 98% 97% 98%  Weight:      Height:        General: Pt is alert, awake, not in acute distress Cardiovascular: RRR, S1/S2 +, no rubs, no gallops Respiratory: CTA bilaterally, no wheezing, no rhonchi Abdominal: Soft,  NT, ND, bowel sounds + Extremities: no edema, no cyanosis    The results of significant diagnostics from this hospitalization (including imaging, microbiology, ancillary and laboratory) are listed below for reference.     Microbiology: Recent Results (from the past 240 hour(s))  Urine Culture (for pregnant, neutropenic or urologic patients or patients with an indwelling urinary catheter)     Status: Abnormal   Collection Time: 09/22/22 12:08 PM   Specimen: Urine, Clean Catch  Result Value Ref Range Status   Specimen Description URINE, CLEAN CATCH  Final   Special Requests   Final    Normal Performed at [facility], [place] [address], [place], [place] [address]    Culture (A)  Final    70,000 COLONIES/mL ESCHERICHIA COLI Confirmed Extended Spectrum Beta-Lactamase Producer (ESBL).  In bloodstream infections from ESBL organisms, carbapenems are preferred over piperacillin/tazobactam. They are shown to have a lower risk of mortality.    Report Status 09/24/2022 FINAL  Final   Organism ID, Bacteria ESCHERICHIA COLI (A)  Final       Susceptibility   Escherichia coli - MIC*    AMPICILLIN >=32 RESISTANT Resistant     CEFAZOLIN >=64 RESISTANT Resistant     CEFEPIME 2 SENSITIVE Sensitive     CEFTRIAXONE >=64 RESISTANT Resistant     CIPROFLOXACIN >=4 RESISTANT Resistant     GENTAMICIN >=16 RESISTANT Resistant     IMIPENEM <=0.25 SENSITIVE Sensitive     NITROFURANTOIN <=16 SENSITIVE Sensitive     TRIMETH/SULFA >=320 RESISTANT Resistant     AMPICILLIN/SULBACTAM 4 SENSITIVE Sensitive     PIP/TAZO <=4 SENSITIVE Sensitive     * 70,000 COLONIES/mL ESCHERICHIA COLI     Labs: BNP (last 3 results) No results for input(s): "BNP" in the last 8760 hours. Basic Metabolic Panel: Recent Labs  Lab 09/22/22 1202 09/22/22 1210 09/22/22 1641 09/23/22 0245 09/24/22 0306 09/25/22 0406  NA 137  --   --  142 144 140  K 2.3*  --  2.5* 2.9* 4.1 4.1  CL 90*  --   --  98 105 106  CO2 32  --   --  33* 31 29  GLUCOSE 148*  --   --  129* 144* 111*  BUN 9  --   --  8 7* 8  CREATININE 1.10*  --   --  1.12* 1.04* 1.11*  CALCIUM 9.1  --   --  8.6* 8.6* 8.5*  MG  --  1.8  --   --   --   --    Liver Function Tests: Recent Labs  Lab 09/22/22 1202  AST 24  ALT 17  ALKPHOS 95  BILITOT 0.8  PROT 7.3  ALBUMIN 3.9   No results for input(s): "LIPASE", "AMYLASE" in the last 168 hours. No results for input(s): "AMMONIA" in the last 168 hours. CBC: Recent Labs  Lab 09/22/22 1202 09/23/22 0245  WBC 9.5 6.9  NEUTROABS 6.4  --   HGB 14.6 12.1  HCT 44.1 36.4  MCV 82.7 82.7  PLT 265 266   Cardiac Enzymes: Recent Labs  Lab 09/23/22 0245  CKTOTAL 36*   BNP: Invalid input(s): "POCBNP" CBG: No results for input(s): "GLUCAP" in the last 168 hours. D-Dimer No results for input(s): "DDIMER" in the last 72 hours. Hgb A1c No results for input(s): "HGBA1C" in the last 72 hours. Lipid Profile No results for input(s): "CHOL", "HDL", "LDLCALC", "TRIG", "CHOLHDL", "LDLDIRECT" in the last 72 hours. Thyroid function studies Recent Labs  09/22/22 1641  TSH 1.762   Anemia work up Recent Labs    09/22/22 1200  VITAMINB12 264   Urinalysis    Component Value Date/Time   COLORURINE STRAW (A) 09/22/2022 1208   APPEARANCEUR CLEAR 09/22/2022 1208   APPEARANCEUR Clear 04/21/2019 1456   LABSPEC 1.006 09/22/2022 1208   PHURINE 5.0 09/22/2022 [place] 09/22/2022 1208   HGBUR SMALL (A) 09/22/2022 1208   HGBUR 2+ 04/04/2010 0843   BILIRUBINUR NEGATIVE 09/22/2022 1208   BILIRUBINUR Negative 04/21/2019 [place] 09/22/2022 1208   PROTEINUR NEGATIVE 09/22/2022 1208   UROBILINOGEN 0.2 06/01/2015 1347   NITRITE NEGATIVE 09/22/2022 1208   LEUKOCYTESUR SMALL (A) 09/22/2022 1208   Sepsis Labs Recent Labs  Lab 09/22/22 1202 09/23/22 0245  WBC 9.5 6.9   Microbiology Recent Results (from the past 240 hour(s))  Urine Culture (for pregnant, neutropenic or urologic patients or patients with an indwelling urinary catheter)     Status: Abnormal   Collection Time: 09/22/22 12:08 PM   Specimen: Urine, Clean Catch  Result Value Ref Range Status   Specimen Description URINE, CLEAN CATCH  Final   Special Requests   Final    Normal Performed at [facility], [address], [place],  [address]    Culture (A)  Final    70,000 COLONIES/mL ESCHERICHIA COLI Confirmed Extended Spectrum Beta-Lactamase Producer (ESBL).  In bloodstream infections from ESBL organisms, carbapenems are preferred over piperacillin/tazobactam. They are shown to have a lower risk of mortality.    Report Status 09/24/2022 FINAL  Final   Organism ID, Bacteria ESCHERICHIA COLI (A)  Final      Susceptibility   Escherichia coli - MIC*    AMPICILLIN >=32 RESISTANT Resistant     CEFAZOLIN >=64 RESISTANT Resistant     CEFEPIME 2 SENSITIVE Sensitive     CEFTRIAXONE >=64 RESISTANT Resistant     CIPROFLOXACIN >=4 RESISTANT Resistant     GENTAMICIN >=16 RESISTANT Resistant     IMIPENEM <=0.25 SENSITIVE Sensitive      NITROFURANTOIN <=16 SENSITIVE Sensitive     TRIMETH/SULFA >=320 RESISTANT Resistant     AMPICILLIN/SULBACTAM 4 SENSITIVE Sensitive     PIP/TAZO <=4 SENSITIVE Sensitive     * 70,000 COLONIES/mL ESCHERICHIA COLI      SIGNED:   Charlynne Cousins, MD  [facility] 09/25/2022, 7:59 AM Pager   If 7PM-7AM, please contact night-coverage [email or web address] Password [email or web address]

## 2022-09-26 DIAGNOSIS — F4310 Post-traumatic stress disorder, unspecified: Secondary | ICD-10-CM | POA: Diagnosis not present

## 2022-09-26 DIAGNOSIS — E039 Hypothyroidism, unspecified: Secondary | ICD-10-CM | POA: Diagnosis not present

## 2022-09-26 DIAGNOSIS — K219 Gastro-esophageal reflux disease without esophagitis: Secondary | ICD-10-CM | POA: Diagnosis not present

## 2022-09-26 DIAGNOSIS — N189 Chronic kidney disease, unspecified: Secondary | ICD-10-CM | POA: Diagnosis not present

## 2022-09-26 DIAGNOSIS — M6281 Muscle weakness (generalized): Secondary | ICD-10-CM | POA: Diagnosis not present

## 2022-09-26 DIAGNOSIS — I129 Hypertensive chronic kidney disease with stage 1 through stage 4 chronic kidney disease, or unspecified chronic kidney disease: Secondary | ICD-10-CM | POA: Diagnosis not present

## 2022-09-26 DIAGNOSIS — Z1152 Encounter for screening for COVID-19: Secondary | ICD-10-CM | POA: Diagnosis not present

## 2022-09-26 DIAGNOSIS — E1122 Type 2 diabetes mellitus with diabetic chronic kidney disease: Secondary | ICD-10-CM | POA: Diagnosis not present

## 2022-09-26 DIAGNOSIS — R296 Repeated falls: Secondary | ICD-10-CM | POA: Diagnosis not present

## 2022-09-26 DIAGNOSIS — M810 Age-related osteoporosis without current pathological fracture: Secondary | ICD-10-CM | POA: Diagnosis not present

## 2022-09-26 DIAGNOSIS — E669 Obesity, unspecified: Secondary | ICD-10-CM | POA: Diagnosis not present

## 2022-09-26 DIAGNOSIS — Z20822 Contact with and (suspected) exposure to covid-19: Secondary | ICD-10-CM | POA: Diagnosis not present

## 2022-09-26 DIAGNOSIS — Z6832 Body mass index (BMI) 32.0-32.9, adult: Secondary | ICD-10-CM | POA: Diagnosis not present

## 2022-09-26 DIAGNOSIS — F32A Depression, unspecified: Secondary | ICD-10-CM | POA: Diagnosis not present

## 2022-09-26 DIAGNOSIS — E785 Hyperlipidemia, unspecified: Secondary | ICD-10-CM | POA: Diagnosis not present

## 2022-09-26 DIAGNOSIS — Z85118 Personal history of other malignant neoplasm of bronchus and lung: Secondary | ICD-10-CM | POA: Diagnosis not present

## 2022-09-26 DIAGNOSIS — M1990 Unspecified osteoarthritis, unspecified site: Secondary | ICD-10-CM | POA: Diagnosis not present

## 2022-09-26 DIAGNOSIS — F419 Anxiety disorder, unspecified: Secondary | ICD-10-CM | POA: Diagnosis not present

## 2022-09-26 DIAGNOSIS — N390 Urinary tract infection, site not specified: Secondary | ICD-10-CM | POA: Diagnosis not present

## 2022-09-26 DIAGNOSIS — F909 Attention-deficit hyperactivity disorder, unspecified type: Secondary | ICD-10-CM | POA: Diagnosis not present

## 2022-10-01 DIAGNOSIS — I129 Hypertensive chronic kidney disease with stage 1 through stage 4 chronic kidney disease, or unspecified chronic kidney disease: Secondary | ICD-10-CM | POA: Diagnosis not present

## 2022-10-01 DIAGNOSIS — F419 Anxiety disorder, unspecified: Secondary | ICD-10-CM | POA: Diagnosis not present

## 2022-10-01 DIAGNOSIS — N390 Urinary tract infection, site not specified: Secondary | ICD-10-CM | POA: Diagnosis not present

## 2022-10-01 DIAGNOSIS — E1122 Type 2 diabetes mellitus with diabetic chronic kidney disease: Secondary | ICD-10-CM | POA: Diagnosis not present

## 2022-10-01 DIAGNOSIS — N189 Chronic kidney disease, unspecified: Secondary | ICD-10-CM | POA: Diagnosis not present

## 2022-10-01 DIAGNOSIS — M6281 Muscle weakness (generalized): Secondary | ICD-10-CM | POA: Diagnosis not present

## 2022-10-03 ENCOUNTER — Ambulatory Visit (INDEPENDENT_AMBULATORY_CARE_PROVIDER_SITE_OTHER): Payer: Medicare Other | Admitting: Family Medicine

## 2022-10-03 VITALS — BP 113/76 | HR 72 | Temp 98.2°F | Resp 16 | Wt 170.8 lb

## 2022-10-03 DIAGNOSIS — Z0389 Encounter for observation for other suspected diseases and conditions ruled out: Secondary | ICD-10-CM | POA: Insufficient documentation

## 2022-10-03 DIAGNOSIS — E1169 Type 2 diabetes mellitus with other specified complication: Secondary | ICD-10-CM

## 2022-10-03 DIAGNOSIS — E042 Nontoxic multinodular goiter: Secondary | ICD-10-CM

## 2022-10-03 DIAGNOSIS — M4850XA Collapsed vertebra, not elsewhere classified, site unspecified, initial encounter for fracture: Secondary | ICD-10-CM | POA: Insufficient documentation

## 2022-10-03 DIAGNOSIS — F419 Anxiety disorder, unspecified: Secondary | ICD-10-CM | POA: Diagnosis not present

## 2022-10-03 DIAGNOSIS — F331 Major depressive disorder, recurrent, moderate: Secondary | ICD-10-CM | POA: Insufficient documentation

## 2022-10-03 DIAGNOSIS — B009 Herpesviral infection, unspecified: Secondary | ICD-10-CM | POA: Insufficient documentation

## 2022-10-03 DIAGNOSIS — Z6830 Body mass index (BMI) 30.0-30.9, adult: Secondary | ICD-10-CM

## 2022-10-03 DIAGNOSIS — F988 Other specified behavioral and emotional disorders with onset usually occurring in childhood and adolescence: Secondary | ICD-10-CM | POA: Insufficient documentation

## 2022-10-03 DIAGNOSIS — F32A Depression, unspecified: Secondary | ICD-10-CM | POA: Diagnosis not present

## 2022-10-03 DIAGNOSIS — M62838 Other muscle spasm: Secondary | ICD-10-CM | POA: Diagnosis not present

## 2022-10-03 DIAGNOSIS — E7800 Pure hypercholesterolemia, unspecified: Secondary | ICD-10-CM | POA: Diagnosis not present

## 2022-10-03 DIAGNOSIS — Z86010 Personal history of colonic polyps: Secondary | ICD-10-CM | POA: Insufficient documentation

## 2022-10-03 DIAGNOSIS — E2839 Other primary ovarian failure: Secondary | ICD-10-CM | POA: Insufficient documentation

## 2022-10-03 DIAGNOSIS — E669 Obesity, unspecified: Secondary | ICD-10-CM

## 2022-10-03 DIAGNOSIS — E538 Deficiency of other specified B group vitamins: Secondary | ICD-10-CM | POA: Insufficient documentation

## 2022-10-03 DIAGNOSIS — E119 Type 2 diabetes mellitus without complications: Secondary | ICD-10-CM | POA: Insufficient documentation

## 2022-10-03 MED ORDER — TRAZODONE HCL 50 MG PO TABS: 50.0000 mg | ORAL_TABLET | Freq: Every day | ORAL | 1 refills | Status: DC

## 2022-10-03 MED ORDER — PRAVASTATIN SODIUM 10 MG PO TABS: 10.0000 mg | ORAL_TABLET | Freq: Every day | ORAL | 1 refills | Status: DC

## 2022-10-03 MED ORDER — TRAMADOL HCL 50 MG PO TABS: 50.0000 mg | ORAL_TABLET | Freq: Four times a day (QID) | ORAL | 0 refills | Status: DC | PRN

## 2022-10-03 MED ORDER — SERTRALINE HCL 100 MG PO TABS: 100.0000 mg | ORAL_TABLET | Freq: Every day | ORAL | 1 refills | Status: DC

## 2022-10-03 MED ORDER — BUPROPION HCL ER (XL) 150 MG PO TB24: 150.0000 mg | ORAL_TABLET | Freq: Every day | ORAL | 1 refills | Status: DC

## 2022-10-03 MED ORDER — ALBUTEROL SULFATE HFA 108 (90 BASE) MCG/ACT IN AERS: 1.0000 | INHALATION_SPRAY | Freq: Four times a day (QID) | RESPIRATORY_TRACT | 0 refills | Status: AC | PRN

## 2022-10-03 MED ORDER — LEVOTHYROXINE SODIUM 25 MCG PO TABS: 25.0000 ug | ORAL_TABLET | Freq: Every day | ORAL | 1 refills | Status: DC

## 2022-10-03 MED ORDER — ALPRAZOLAM 0.5 MG PO TABS: 0.5000 mg | ORAL_TABLET | Freq: Three times a day (TID) | ORAL | 0 refills | Status: DC | PRN

## 2022-10-04 ENCOUNTER — Encounter: Payer: Self-pay | Admitting: Family Medicine

## 2022-10-04 LAB — POCT GLYCOSYLATED HEMOGLOBIN (HGB A1C): Hemoglobin A1C: 6.4 % — AB (ref 4.0–5.6)

## 2022-10-04 NOTE — Progress Notes (Signed)
Established Patient Office Visit  Subjective    Patient ID: Jenny Cardenas, female    DOB: 08-Nov-1950  Age: 72 y.o. MRN: [id number]  CC: No chief complaint on file.   HPI [name] presents for follow up of chronic med issues. Patient was recently hospitalized for hypokalemia as she was taking her diuretic daily instead of prn and not drinking fluids. She is improved and back to baseline. She denies acute complaints.    Outpatient Encounter Medications as of 10/03/2022  Medication Sig   albuterol (PROVENTIL) (2.5 MG/3ML) 0.083% nebulizer solution Take 3 mLs (2.5 mg total) by nebulization every 4 (four) hours as needed for wheezing or shortness of breath.   amphetamine-dextroamphetamine (ADDERALL) 10 MG tablet Take 10 mg by mouth 2 (two) times daily.   diphenhydramine-acetaminophen (TYLENOL PM) 25-500 MG TABS tablet Take 2 tablets by mouth at bedtime as needed.   furosemide (LASIX) 20 MG tablet Take 1 tablet (20 mg total) by mouth daily as needed. May take an additional dose if needed for swelling   guaiFENesin (MUCINEX) 600 MG 12 hr tablet Take 1 tablet (600 mg total) by mouth 2 (two) times daily. (Patient taking differently: Take 600 mg by mouth 2 (two) times daily as needed for to loosen phlegm or cough.)   ibuprofen (ADVIL) 200 MG tablet Take 200 mg by mouth as needed.   levocetirizine (XYZAL) 5 MG tablet Take 5 mg by mouth daily as needed for allergies.   Multiple Vitamins-Minerals (PRESERVISION AREDS 2 PO) Take 1 capsule by mouth in the morning and at bedtime.   pantoprazole (PROTONIX) 40 MG tablet Take 1 tablet (40 mg total) by mouth daily.   PRESCRIPTION MEDICATION Natrol memory complex daily   Probiotic Product (PROBIOTIC DAILY PO) Take by mouth daily.   RSV vaccine recomb adjuvanted (AREXVY) 120 MCG/0.5ML injection Inject into the muscle.   [DISCONTINUED] albuterol (VENTOLIN HFA) 108 (90 Base) MCG/ACT inhaler Inhale 1-2 puffs into the lungs every 6 (six) hours as needed for  wheezing or shortness of breath. This is a one-time refill from me until the patient establish care with her primary care physician.  Please do not send request for refill to me in the future.   [DISCONTINUED] ALPRAZolam (XANAX) 0.5 MG tablet Take 1 tablet (0.5 mg total) by mouth 3 (three) times daily as needed for anxiety.   [DISCONTINUED] buPROPion (WELLBUTRIN XL) 150 MG 24 hr tablet Take 1 tablet (150 mg total) by mouth daily.   [DISCONTINUED] levothyroxine (SYNTHROID) 25 MCG tablet Take 1 tablet (25 mcg total) by mouth daily before breakfast.   [DISCONTINUED] pravastatin (PRAVACHOL) 10 MG tablet Take 1 tablet (10 mg total) by mouth daily.   [DISCONTINUED] sertraline (ZOLOFT) 100 MG tablet Take 1 tablet (100 mg total) by mouth daily.   [DISCONTINUED] traMADol (ULTRAM) 50 MG tablet Take 1 tablet (50 mg total) by mouth every 6 (six) hours as needed (mild pain).   [DISCONTINUED] traZODone (DESYREL) 50 MG tablet Take 50 mg by mouth at bedtime.   albuterol (VENTOLIN HFA) 108 (90 Base) MCG/ACT inhaler Inhale 1-2 puffs into the lungs every 6 (six) hours as needed for wheezing or shortness of breath. This is a one-time refill from me until the patient establish care with her primary care physician.  Please do not send request for refill to me in the future.   ALPRAZolam (XANAX) 0.5 MG tablet Take 1 tablet (0.5 mg total) by mouth 3 (three) times daily as needed for anxiety.  buPROPion (WELLBUTRIN XL) 150 MG 24 hr tablet Take 1 tablet (150 mg total) by mouth daily.   levothyroxine (SYNTHROID) 25 MCG tablet Take 1 tablet (25 mcg total) by mouth daily before breakfast.   pravastatin (PRAVACHOL) 10 MG tablet Take 1 tablet (10 mg total) by mouth daily.   sertraline (ZOLOFT) 100 MG tablet Take 1 tablet (100 mg total) by mouth daily.   traMADol (ULTRAM) 50 MG tablet Take 1 tablet (50 mg total) by mouth every 6 (six) hours as needed (mild pain).   traZODone (DESYREL) 50 MG tablet Take 1 tablet (50 mg total) by  mouth at bedtime.   No facility-administered encounter medications on file as of 10/03/2022.    Past Medical History:  Diagnosis Date   ADHD (attention deficit hyperactivity disorder) 2020   ADD   Anxiety    Arthritis    Broken heart syndrome 2013   Carpal tunnel syndrome    Cataract    Chronic kidney disease    Depression    Diabetes mellitus without complication ([place])    Type 2   Dysrhythmia 1976   tachycardia   Fall 04/10/2015   GERD (gastroesophageal reflux disease)    Goiter    History of shingles    On abdomen, left ring finger, and left leg; Pt takes Valtrex   Hyperlipidemia    Hypertension    Hypothyroidism    lung ca 04/2021   Obesity    PTSD (post-traumatic stress disorder)    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   Shortness of breath dyspnea    pt stated its related to the back problems she has   Thyroid disease    Ulcer     Past Surgical History:  Procedure Laterality Date   ABDOMINAL HYSTERECTOMY  1996   BRONCHIAL BRUSHINGS  06/29/2021   Procedure: BRONCHIAL BRUSHINGS;  Surgeon: Collene Gobble, MD;  Location: [facility] ENDOSCOPY;  Service: Pulmonary;;   BRONCHIAL NEEDLE ASPIRATION BIOPSY  06/29/2021   Procedure: BRONCHIAL NEEDLE ASPIRATION BIOPSIES;  Surgeon: Collene Gobble, MD;  Location: [place] ENDOSCOPY;  Service: Pulmonary;;   CATARACT EXTRACTION, BILATERAL     CESAREAN [place], 1984   COLONOSCOPY W/ POLYPECTOMY     EYE SURGERY Bilateral 2014   cataratact and lasik surgery   FIDUCIAL MARKER PLACEMENT  06/29/2021   Procedure: FIDUCIAL DYE MARKING;  Surgeon: Collene Gobble, MD;  Location: [place] ENDOSCOPY;  Service: Pulmonary;;   INTERCOSTAL NERVE BLOCK  06/29/2021   Procedure: INTERCOSTAL NERVE BLOCK;  Surgeon: Lajuana Matte, MD;  Location: [place];  Service: Thoracic;;   KYPHOPLASTY N/A 06/02/2015   Procedure: KYPHOPLASTY;  Surgeon: Phylliss Bob, MD;  Location: [place];  Service: Orthopedics;  Laterality: N/A;  Thoracic 3, 8, 10 kyphoplasty    LOBECTOMY  06/29/2021   Procedure: RIGHT UPPER LOBECTOMY;  Surgeon: Lajuana Matte, MD;  Location: [place] OR;  Service: Thoracic;;   LYMPH NODE DISSECTION  06/29/2021   Procedure: LYMPH NODE DISSECTION;  Surgeon: Lajuana Matte, MD;  Location: [facility] OR;  Service: Thoracic;;    Family History  Problem Relation Age of Onset   COPD Mother    Depression Mother    Hypertension Mother    Hyperlipidemia Mother    Breast cancer Mother 29   Diverticulosis Mother    Heart disease Father    Dementia Father    Breast cancer Sister 64   Non-Hodgkin's lymphoma Sister    Colon cancer Neg Hx  Social History   Socioeconomic History   Marital status: Widowed    Spouse name: Not on file   Number of children: 2   Years of education: Not on file   Highest education level: Not on file  Occupational History   Not on file  Tobacco Use   Smoking status: Former    Packs/day: 0.50    Years: 4.00    Total pack years: 2.00    Types: Cigarettes    Quit date: 06/28/2016    Years since quitting: 6.2   Smokeless tobacco: Never  Vaping Use   Vaping Use: Never used  Substance and Sexual Activity   Alcohol use: Not Currently    Comment: rare glass of wine (holidays only)   Drug use: Never   Sexual activity: Not Currently  Other Topics Concern   Not on file  Social History Narrative   Not on file   Social Determinants of Health   Financial Resource Strain: Low Risk  (05/18/2022)   Overall Financial Resource Strain (CARDIA)    Difficulty of Paying Living Expenses: Not hard at all  Food Insecurity: No Food Insecurity (09/22/2022)   Hunger Vital Sign    Worried About Running Out of Food in the Last Year: Never true    Ran Out of Food in the Last Year: Never true  Transportation Needs: No Transportation Needs (09/22/2022)   PRAPARE - Hydrologist (Medical): No    Lack of Transportation (Non-Medical): No  Physical Activity: Insufficiently Active (05/18/2022)    Exercise Vital Sign    Days of Exercise per Week: 2 days    Minutes of Exercise per Session: 30 min  Stress: Stress Concern Present (05/18/2022)   [place]    Feeling of Stress : To some extent  Social Connections: Moderately Isolated (05/18/2022)   Social Connection and Isolation Panel [NHANES]    Frequency of Communication with Friends and Family: More than three times a week    Frequency of Social Gatherings with Friends and Family: Once a week    Attends Religious Services: More than 4 times per year    Active Member of [employer] or Organizations: No    Attends Archivist Meetings: Never    Marital Status: Widowed  Intimate Partner Violence: Not At Risk (09/22/2022)   Humiliation, Afraid, Rape, and Kick questionnaire    Fear of Current or Ex-Partner: No    Emotionally Abused: No    Physically Abused: No    Sexually Abused: No    Review of Systems  All other systems reviewed and are negative.       Objective    BP 113/76   Pulse 72   Temp 98.2 F (36.8 C) (Oral)   Resp 16   Wt 170 lb 12.8 oz (77.5 kg)   SpO2 97%   BMI 30.26 kg/m   Physical Exam Vitals and nursing note reviewed.  Constitutional:      General: She is not in acute distress. Cardiovascular:     Rate and Rhythm: Normal rate and regular rhythm.  Pulmonary:     Effort: Pulmonary effort is normal.     Breath sounds: Normal breath sounds.  Abdominal:     Palpations: Abdomen is soft.     Tenderness: There is no abdominal tenderness.  Neurological:     General: No focal deficit present.     Mental Status: She is alert and oriented to  person, place, and time.         Assessment & Plan:   1. Diabetes mellitus type 2 in obese [facility]) Continue present management. Discussed compliance - Microalbumin / creatinine urine ratio - POCT glycosylated hemoglobin (Hb A1C)  2. Multinodular goiter Meds refilled. continue -  levothyroxine (SYNTHROID) 25 MCG tablet; Take 1 tablet (25 mcg total) by mouth daily before breakfast.  Dispense: 90 tablet; Refill: 1  3. Anxiety and depression Doing well with present management. Continue. Xanax also refilled for prn usage only - buPROPion (WELLBUTRIN XL) 150 MG 24 hr tablet; Take 1 tablet (150 mg total) by mouth daily.  Dispense: 90 tablet; Refill: 1 - sertraline (ZOLOFT) 100 MG tablet; Take 1 tablet (100 mg total) by mouth daily.  Dispense: 90 tablet; Refill: 1  4. Pure hypercholesterolemia Continue.  - pravastatin (PRAVACHOL) 10 MG tablet; Take 1 tablet (10 mg total) by mouth daily.  Dispense: 90 tablet; Refill: 1  5. Night muscle spasms Continue  - traMADol (ULTRAM) 50 MG tablet; Take 1 tablet (50 mg total) by mouth every 6 (six) hours as needed (mild pain).  Dispense: 30 tablet; Refill: 0    Return in about 6 weeks (around 11/14/2022) for follow up.   Becky Sax, MD

## 2022-10-05 DIAGNOSIS — F419 Anxiety disorder, unspecified: Secondary | ICD-10-CM | POA: Diagnosis not present

## 2022-10-05 DIAGNOSIS — N189 Chronic kidney disease, unspecified: Secondary | ICD-10-CM | POA: Diagnosis not present

## 2022-10-05 DIAGNOSIS — E1122 Type 2 diabetes mellitus with diabetic chronic kidney disease: Secondary | ICD-10-CM | POA: Diagnosis not present

## 2022-10-05 DIAGNOSIS — M6281 Muscle weakness (generalized): Secondary | ICD-10-CM | POA: Diagnosis not present

## 2022-10-05 DIAGNOSIS — N390 Urinary tract infection, site not specified: Secondary | ICD-10-CM | POA: Diagnosis not present

## 2022-10-05 DIAGNOSIS — I129 Hypertensive chronic kidney disease with stage 1 through stage 4 chronic kidney disease, or unspecified chronic kidney disease: Secondary | ICD-10-CM | POA: Diagnosis not present

## 2022-10-08 DIAGNOSIS — N390 Urinary tract infection, site not specified: Secondary | ICD-10-CM | POA: Diagnosis not present

## 2022-10-08 DIAGNOSIS — I129 Hypertensive chronic kidney disease with stage 1 through stage 4 chronic kidney disease, or unspecified chronic kidney disease: Secondary | ICD-10-CM | POA: Diagnosis not present

## 2022-10-08 DIAGNOSIS — N189 Chronic kidney disease, unspecified: Secondary | ICD-10-CM | POA: Diagnosis not present

## 2022-10-08 DIAGNOSIS — E1122 Type 2 diabetes mellitus with diabetic chronic kidney disease: Secondary | ICD-10-CM | POA: Diagnosis not present

## 2022-10-08 DIAGNOSIS — F419 Anxiety disorder, unspecified: Secondary | ICD-10-CM | POA: Diagnosis not present

## 2022-10-08 DIAGNOSIS — M6281 Muscle weakness (generalized): Secondary | ICD-10-CM | POA: Diagnosis not present

## 2022-10-16 ENCOUNTER — Ambulatory Visit: Payer: Medicare Other | Admitting: Family Medicine

## 2022-10-16 DIAGNOSIS — N189 Chronic kidney disease, unspecified: Secondary | ICD-10-CM | POA: Diagnosis not present

## 2022-10-16 DIAGNOSIS — N390 Urinary tract infection, site not specified: Secondary | ICD-10-CM | POA: Diagnosis not present

## 2022-10-16 DIAGNOSIS — M6281 Muscle weakness (generalized): Secondary | ICD-10-CM | POA: Diagnosis not present

## 2022-10-16 DIAGNOSIS — F419 Anxiety disorder, unspecified: Secondary | ICD-10-CM | POA: Diagnosis not present

## 2022-10-16 DIAGNOSIS — I129 Hypertensive chronic kidney disease with stage 1 through stage 4 chronic kidney disease, or unspecified chronic kidney disease: Secondary | ICD-10-CM | POA: Diagnosis not present

## 2022-10-16 DIAGNOSIS — E1122 Type 2 diabetes mellitus with diabetic chronic kidney disease: Secondary | ICD-10-CM | POA: Diagnosis not present

## 2022-10-18 DIAGNOSIS — I129 Hypertensive chronic kidney disease with stage 1 through stage 4 chronic kidney disease, or unspecified chronic kidney disease: Secondary | ICD-10-CM | POA: Diagnosis not present

## 2022-10-18 DIAGNOSIS — M6281 Muscle weakness (generalized): Secondary | ICD-10-CM | POA: Diagnosis not present

## 2022-10-18 DIAGNOSIS — F419 Anxiety disorder, unspecified: Secondary | ICD-10-CM | POA: Diagnosis not present

## 2022-10-18 DIAGNOSIS — E1122 Type 2 diabetes mellitus with diabetic chronic kidney disease: Secondary | ICD-10-CM | POA: Diagnosis not present

## 2022-10-18 DIAGNOSIS — N189 Chronic kidney disease, unspecified: Secondary | ICD-10-CM | POA: Diagnosis not present

## 2022-10-18 DIAGNOSIS — N390 Urinary tract infection, site not specified: Secondary | ICD-10-CM | POA: Diagnosis not present

## 2022-10-22 DIAGNOSIS — Z20822 Contact with and (suspected) exposure to covid-19: Secondary | ICD-10-CM | POA: Diagnosis not present

## 2022-10-22 DIAGNOSIS — Z1152 Encounter for screening for COVID-19: Secondary | ICD-10-CM | POA: Diagnosis not present

## 2022-10-23 ENCOUNTER — Ambulatory Visit (HOSPITAL_COMMUNITY)
Admission: RE | Admit: 2022-10-23 | Discharge: 2022-10-23 | Disposition: A | Discharge status: Home / Self Care | Payer: Medicare Other | Source: Ambulatory Visit | Attending: Internal Medicine | Admitting: Internal Medicine

## 2022-10-23 DIAGNOSIS — C3490 Malignant neoplasm of unspecified part of unspecified bronchus or lung: Secondary | ICD-10-CM | POA: Insufficient documentation

## 2022-10-23 DIAGNOSIS — R918 Other nonspecific abnormal finding of lung field: Secondary | ICD-10-CM | POA: Diagnosis not present

## 2022-10-23 MED ORDER — IOHEXOL 300 MG/ML  SOLN
75.0000 mL | Freq: Once | INTRAMUSCULAR | Status: AC | PRN
  Administered 2022-10-23: 75 mL via INTRAVENOUS

## 2022-10-24 ENCOUNTER — Inpatient Hospital Stay (HOSPITAL_BASED_OUTPATIENT_CLINIC_OR_DEPARTMENT_OTHER): Payer: Medicare Other | Admitting: Internal Medicine

## 2022-10-24 ENCOUNTER — Inpatient Hospital Stay: Payer: Medicare Other | Attending: Internal Medicine

## 2022-10-24 DIAGNOSIS — E1122 Type 2 diabetes mellitus with diabetic chronic kidney disease: Secondary | ICD-10-CM | POA: Insufficient documentation

## 2022-10-24 DIAGNOSIS — N189 Chronic kidney disease, unspecified: Secondary | ICD-10-CM | POA: Diagnosis not present

## 2022-10-24 DIAGNOSIS — Z79899 Other long term (current) drug therapy: Secondary | ICD-10-CM | POA: Diagnosis not present

## 2022-10-24 DIAGNOSIS — C3490 Malignant neoplasm of unspecified part of unspecified bronchus or lung: Secondary | ICD-10-CM

## 2022-10-24 DIAGNOSIS — Z902 Acquired absence of lung [part of]: Secondary | ICD-10-CM | POA: Insufficient documentation

## 2022-10-24 DIAGNOSIS — Z85118 Personal history of other malignant neoplasm of bronchus and lung: Secondary | ICD-10-CM | POA: Insufficient documentation

## 2022-10-24 DIAGNOSIS — I129 Hypertensive chronic kidney disease with stage 1 through stage 4 chronic kidney disease, or unspecified chronic kidney disease: Secondary | ICD-10-CM | POA: Diagnosis not present

## 2022-10-24 LAB — CMP (CANCER CENTER ONLY)
ALT: 12 U/L (ref 0–44)
AST: 19 U/L (ref 15–41)
Albumin: 4.2 g/dL (ref 3.5–5.0)
Alkaline Phosphatase: 92 U/L (ref 38–126)
Anion gap: 7 (ref 5–15)
BUN: 14 mg/dL (ref 8–23)
CO2: 29 mmol/L (ref 22–32)
Calcium: 9.2 mg/dL (ref 8.9–10.3)
Chloride: 105 mmol/L (ref 98–111)
Creatinine: 1.16 mg/dL — ABNORMAL HIGH (ref 0.44–1.00)
GFR, Estimated: 50 mL/min — ABNORMAL LOW (ref >60)
Glucose, Bld: 128 mg/dL — ABNORMAL HIGH (ref 70–99)
Potassium: 4.1 mmol/L (ref 3.5–5.1)
Sodium: 141 mmol/L (ref 135–145)
Total Bilirubin: 0.5 mg/dL (ref 0.3–1.2)
Total Protein: 7.1 g/dL (ref 6.5–8.1)

## 2022-10-24 LAB — CBC WITH DIFFERENTIAL (CANCER CENTER ONLY)
Abs Immature Granulocytes: 0.02 10*3/uL (ref 0.00–0.07)
Basophils Absolute: 0 10*3/uL (ref 0.0–0.1)
Basophils Relative: 1 %
Eosinophils Absolute: 0.2 10*3/uL (ref 0.0–0.5)
Eosinophils Relative: 2 %
HCT: 40.9 % (ref 36.0–46.0)
Hemoglobin: 13.4 g/dL (ref 12.0–15.0)
Immature Granulocytes: 0 %
Lymphocytes Relative: 25 %
Lymphs Abs: 1.9 10*3/uL (ref 0.7–4.0)
MCH: 28.2 pg (ref 26.0–34.0)
MCHC: 32.8 g/dL (ref 30.0–36.0)
MCV: 86.1 fL (ref 80.0–100.0)
Monocytes Absolute: 0.4 10*3/uL (ref 0.1–1.0)
Monocytes Relative: 4 %
Neutro Abs: 5.4 10*3/uL (ref 1.7–7.7)
Neutrophils Relative %: 68 %
Platelet Count: 261 10*3/uL (ref 150–400)
RBC: 4.75 MIL/uL (ref 3.87–5.11)
RDW: 13.8 % (ref 11.5–15.5)
WBC Count: 7.9 10*3/uL (ref 4.0–10.5)
nRBC: 0 % (ref 0.0–0.2)

## 2022-10-24 NOTE — Progress Notes (Signed)
Jenny Cardenas [phone]   [phone]  OFFICE PROGRESS NOTE  [address], [address] [place] [address]  DIAGNOSIS: Stage IA (T1 a, N0, M0) non-small cell lung cancer, adenocarcinoma  PRIOR THERAPY:  status post right upper lobectomy with lymph node dissection under the care of Dr. Kipp Brood on June 29, 2021.  The tumor size was 0.8 cm.  CURRENT THERAPY: Observation.  INTERVAL HISTORY: Jenny Cardenas 72 y.o. female returns to clinic today for follow-up visit.  Her daughter Jenny Cardenas was available by phone during the visit.  The patient is feeling fine today with no concerning complaints except for mild fatigue and shortness of breath with exertion but no significant chest pain, cough or hemoptysis.  She has no nausea, vomiting, diarrhea or constipation.  She has no headache or visual changes.  She denied having any significant weight loss or night sweats.  She is here today for evaluation after repeating CT scan of the chest for restaging of her disease.  MEDICAL HISTORY: Past Medical History:  Diagnosis Date   ADHD (attention deficit hyperactivity disorder) 2020   ADD   Anxiety    Arthritis    Broken heart syndrome 2013   Carpal tunnel syndrome    Cataract    Chronic kidney disease    Depression    Diabetes mellitus without complication ([place])    Type 2   Dysrhythmia 1976   tachycardia   Fall 04/10/2015   GERD (gastroesophageal reflux disease)    Goiter    History of shingles    On abdomen, left ring finger, and left leg; Pt takes Valtrex   Hyperlipidemia    Hypertension    Hypothyroidism    lung ca 04/2021   Obesity    PTSD (post-traumatic stress disorder)    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   Shortness of breath dyspnea    pt stated its related to the back problems she has   Thyroid disease    Ulcer     ALLERGIES:  is allergic to ethylene oxide and other.  MEDICATIONS:  Current Outpatient  Medications  Medication Sig Dispense Refill   albuterol (PROVENTIL) (2.5 MG/3ML) 0.083% nebulizer solution Take 3 mLs (2.5 mg total) by nebulization every 4 (four) hours as needed for wheezing or shortness of breath. 75 mL 10   albuterol (VENTOLIN HFA) 108 (90 Base) MCG/ACT inhaler Inhale 1-2 puffs into the lungs every 6 (six) hours as needed for wheezing or shortness of breath. This is a one-time refill from me until the patient establish care with her primary care physician.  Please do not send request for refill to me in the future. 8 g 0   ALPRAZolam (XANAX) 0.5 MG tablet Take 1 tablet (0.5 mg total) by mouth 3 (three) times daily as needed for anxiety. 10 tablet 0   buPROPion (WELLBUTRIN XL) 150 MG 24 hr tablet Take 1 tablet (150 mg total) by mouth daily. 90 tablet 1   diphenhydramine-acetaminophen (TYLENOL PM) 25-500 MG TABS tablet Take 2 tablets by mouth at bedtime as needed.     guaiFENesin (MUCINEX) 600 MG 12 hr tablet Take 1 tablet (600 mg total) by mouth 2 (two) times daily. (Patient taking differently: Take 600 mg by mouth 2 (two) times daily as needed for to loosen phlegm or cough.)     ibuprofen (ADVIL) 200 MG tablet Take 200 mg by mouth as needed.     levocetirizine (XYZAL)  5 MG tablet Take 5 mg by mouth daily as needed for allergies.     levothyroxine (SYNTHROID) 25 MCG tablet Take 1 tablet (25 mcg total) by mouth daily before breakfast. 90 tablet 1   Multiple Vitamins-Minerals (PRESERVISION AREDS 2 PO) Take 1 capsule by mouth in the morning and at bedtime.     pantoprazole (PROTONIX) 40 MG tablet Take 1 tablet (40 mg total) by mouth daily. 90 tablet 1   pravastatin (PRAVACHOL) 10 MG tablet Take 1 tablet (10 mg total) by mouth daily. 90 tablet 1   PRESCRIPTION MEDICATION Natrol memory complex daily     Probiotic Product (PROBIOTIC DAILY PO) Take by mouth daily.     sertraline (ZOLOFT) 100 MG tablet Take 1 tablet (100 mg total) by mouth daily. 90 tablet 1   traMADol (ULTRAM) 50 MG  tablet Take 1 tablet (50 mg total) by mouth every 6 (six) hours as needed (mild pain). 30 tablet 0   traZODone (DESYREL) 50 MG tablet Take 1 tablet (50 mg total) by mouth at bedtime. 90 tablet 1   No current facility-administered medications for this visit.    SURGICAL HISTORY:  Past Surgical History:  Procedure Laterality Date   ABDOMINAL HYSTERECTOMY  1996   BRONCHIAL BRUSHINGS  06/29/2021   Procedure: BRONCHIAL BRUSHINGS;  Surgeon: Collene Gobble, MD;  Location: [facility] ENDOSCOPY;  Service: Pulmonary;;   BRONCHIAL NEEDLE ASPIRATION BIOPSY  06/29/2021   Procedure: BRONCHIAL NEEDLE ASPIRATION BIOPSIES;  Surgeon: Collene Gobble, MD;  Location: [place] ENDOSCOPY;  Service: Pulmonary;;   CATARACT EXTRACTION, BILATERAL     CESAREAN [place], 1984   COLONOSCOPY W/ POLYPECTOMY     EYE SURGERY Bilateral 2014   cataratact and lasik surgery   FIDUCIAL MARKER PLACEMENT  06/29/2021   Procedure: FIDUCIAL DYE MARKING;  Surgeon: Collene Gobble, MD;  Location: [facility] ENDOSCOPY;  Service: Pulmonary;;   INTERCOSTAL NERVE BLOCK  06/29/2021   Procedure: INTERCOSTAL NERVE BLOCK;  Surgeon: Lajuana Matte, MD;  Location: [place];  Service: Thoracic;;   KYPHOPLASTY N/A 06/02/2015   Procedure: KYPHOPLASTY;  Surgeon: Phylliss Bob, MD;  Location: [place];  Service: Orthopedics;  Laterality: N/A;  Thoracic 3, 8, 10 kyphoplasty   LOBECTOMY  06/29/2021   Procedure: RIGHT UPPER LOBECTOMY;  Surgeon: Lajuana Matte, MD;  Location: [place];  Service: Thoracic;;   LYMPH NODE DISSECTION  06/29/2021   Procedure: LYMPH NODE DISSECTION;  Surgeon: Lajuana Matte, MD;  Location: [place];  Service: Thoracic;;    REVIEW OF SYSTEMS:  A comprehensive review of systems was negative except for: Constitutional: positive for fatigue Respiratory: positive for dyspnea on exertion   PHYSICAL EXAMINATION: General appearance: alert, cooperative, appears stated age, and fatigued Head: Normocephalic, without obvious  abnormality, atraumatic Neck: no adenopathy, no JVD, supple, symmetrical, trachea midline, and thyroid not enlarged, symmetric, no tenderness/mass/nodules Lymph nodes: Cervical, supraclavicular, and axillary nodes normal. Resp: clear to auscultation bilaterally Back: symmetric, no curvature. ROM normal. No CVA tenderness. Cardio: regular rate and rhythm, S1, S2 normal, no murmur, click, rub or gallop GI: soft, non-tender; bowel sounds normal; no masses,  no organomegaly Extremities: extremities normal, atraumatic, no cyanosis or edema  ECOG PERFORMANCE STATUS: 1 - Symptomatic but completely ambulatory  There were no vitals taken for this visit.  LABORATORY DATA: Lab Results  Component Value Date   WBC 7.9 10/24/2022   HGB 13.4 10/24/2022   HCT 40.9 10/24/2022   MCV 86.1 10/24/2022   PLT 261 10/24/2022  Chemistry      Component Value Date/Time   NA 140 09/25/2022 0406   K 4.1 09/25/2022 0406   CL 106 09/25/2022 0406   CO2 29 09/25/2022 0406   BUN 8 09/25/2022 0406   CREATININE 1.11 (H) 09/25/2022 0406   CREATININE 1.25 (H) 06/25/2022 0726   CREATININE 0.83 10/26/2016 1039      Component Value Date/Time   CALCIUM 8.5 (L) 09/25/2022 0406   ALKPHOS 95 09/22/2022 1202   AST 24 09/22/2022 1202   AST 19 06/25/2022 0726   ALT 17 09/22/2022 1202   ALT 14 06/25/2022 0726   BILITOT 0.8 09/22/2022 1202   BILITOT 0.4 06/25/2022 0726       RADIOGRAPHIC STUDIES: No results found.  ASSESSMENT AND PLAN: This is a very pleasant 72 years old white female with stage IA (T1 a, N0, M0) non-small cell lung cancer, adenocarcinoma status post right upper lobectomy with lymph node dissection under the care of Dr. Kipp Brood on June 29, 2021. The patient is currently on observation and she is feeling fine. She was found on previous CT scan of the chest to have a suspicious 6 mm pleural-based nodule. I ordered a CT scan of the chest which was performed yesterday but the final report  is still pending.  I personally and independently reviewed the scan and discussed the result and showed the images to the patient today. Her scan showed no other concerning findings but there was persistent of the right pleural-based nodule that probably slightly increased from the previous scan. If the final report showed no other concerning findings, I will see the patient back for follow-up visit in 6 months with repeat CT scan of the chest for restaging of her disease. She was advised to call immediately if she has any other concerning symptoms in the interval. The patient voices understanding of current disease status and treatment options and is in agreement with the current care plan.  All questions were answered. The patient knows to call the clinic with any problems, questions or concerns. We can certainly see the patient much sooner if necessary. The total time spent in the appointment was 20 minutes.  Disclaimer: This note was dictated with voice recognition software. Similar sounding words can inadvertently be transcribed and may not be corrected upon review.

## 2022-10-25 ENCOUNTER — Ambulatory Visit: Payer: Medicare Other | Admitting: Family Medicine

## 2022-10-26 DIAGNOSIS — E1122 Type 2 diabetes mellitus with diabetic chronic kidney disease: Secondary | ICD-10-CM | POA: Diagnosis not present

## 2022-10-26 DIAGNOSIS — E669 Obesity, unspecified: Secondary | ICD-10-CM | POA: Diagnosis not present

## 2022-10-26 DIAGNOSIS — F909 Attention-deficit hyperactivity disorder, unspecified type: Secondary | ICD-10-CM | POA: Diagnosis not present

## 2022-10-26 DIAGNOSIS — N189 Chronic kidney disease, unspecified: Secondary | ICD-10-CM | POA: Diagnosis not present

## 2022-10-26 DIAGNOSIS — I129 Hypertensive chronic kidney disease with stage 1 through stage 4 chronic kidney disease, or unspecified chronic kidney disease: Secondary | ICD-10-CM | POA: Diagnosis not present

## 2022-10-26 DIAGNOSIS — M1990 Unspecified osteoarthritis, unspecified site: Secondary | ICD-10-CM | POA: Diagnosis not present

## 2022-10-26 DIAGNOSIS — Z6832 Body mass index (BMI) 32.0-32.9, adult: Secondary | ICD-10-CM | POA: Diagnosis not present

## 2022-10-26 DIAGNOSIS — F32A Depression, unspecified: Secondary | ICD-10-CM | POA: Diagnosis not present

## 2022-10-26 DIAGNOSIS — F419 Anxiety disorder, unspecified: Secondary | ICD-10-CM | POA: Diagnosis not present

## 2022-10-26 DIAGNOSIS — K219 Gastro-esophageal reflux disease without esophagitis: Secondary | ICD-10-CM | POA: Diagnosis not present

## 2022-10-26 DIAGNOSIS — R296 Repeated falls: Secondary | ICD-10-CM | POA: Diagnosis not present

## 2022-10-26 DIAGNOSIS — E785 Hyperlipidemia, unspecified: Secondary | ICD-10-CM | POA: Diagnosis not present

## 2022-10-26 DIAGNOSIS — Z85118 Personal history of other malignant neoplasm of bronchus and lung: Secondary | ICD-10-CM | POA: Diagnosis not present

## 2022-10-26 DIAGNOSIS — F4310 Post-traumatic stress disorder, unspecified: Secondary | ICD-10-CM | POA: Diagnosis not present

## 2022-10-26 DIAGNOSIS — M6281 Muscle weakness (generalized): Secondary | ICD-10-CM | POA: Diagnosis not present

## 2022-10-26 DIAGNOSIS — N390 Urinary tract infection, site not specified: Secondary | ICD-10-CM | POA: Diagnosis not present

## 2022-10-26 DIAGNOSIS — E039 Hypothyroidism, unspecified: Secondary | ICD-10-CM | POA: Diagnosis not present

## 2022-10-26 DIAGNOSIS — M810 Age-related osteoporosis without current pathological fracture: Secondary | ICD-10-CM | POA: Diagnosis not present

## 2022-10-29 DIAGNOSIS — E1122 Type 2 diabetes mellitus with diabetic chronic kidney disease: Secondary | ICD-10-CM | POA: Diagnosis not present

## 2022-10-29 DIAGNOSIS — N390 Urinary tract infection, site not specified: Secondary | ICD-10-CM | POA: Diagnosis not present

## 2022-10-29 DIAGNOSIS — F419 Anxiety disorder, unspecified: Secondary | ICD-10-CM | POA: Diagnosis not present

## 2022-10-29 DIAGNOSIS — M6281 Muscle weakness (generalized): Secondary | ICD-10-CM | POA: Diagnosis not present

## 2022-10-29 DIAGNOSIS — N189 Chronic kidney disease, unspecified: Secondary | ICD-10-CM | POA: Diagnosis not present

## 2022-10-29 DIAGNOSIS — I129 Hypertensive chronic kidney disease with stage 1 through stage 4 chronic kidney disease, or unspecified chronic kidney disease: Secondary | ICD-10-CM | POA: Diagnosis not present

## 2022-11-01 DIAGNOSIS — N189 Chronic kidney disease, unspecified: Secondary | ICD-10-CM | POA: Diagnosis not present

## 2022-11-01 DIAGNOSIS — E1122 Type 2 diabetes mellitus with diabetic chronic kidney disease: Secondary | ICD-10-CM | POA: Diagnosis not present

## 2022-11-01 DIAGNOSIS — F419 Anxiety disorder, unspecified: Secondary | ICD-10-CM | POA: Diagnosis not present

## 2022-11-01 DIAGNOSIS — N390 Urinary tract infection, site not specified: Secondary | ICD-10-CM | POA: Diagnosis not present

## 2022-11-01 DIAGNOSIS — M6281 Muscle weakness (generalized): Secondary | ICD-10-CM | POA: Diagnosis not present

## 2022-11-01 DIAGNOSIS — I129 Hypertensive chronic kidney disease with stage 1 through stage 4 chronic kidney disease, or unspecified chronic kidney disease: Secondary | ICD-10-CM | POA: Diagnosis not present

## 2022-11-07 DIAGNOSIS — N189 Chronic kidney disease, unspecified: Secondary | ICD-10-CM | POA: Diagnosis not present

## 2022-11-07 DIAGNOSIS — N390 Urinary tract infection, site not specified: Secondary | ICD-10-CM | POA: Diagnosis not present

## 2022-11-07 DIAGNOSIS — I129 Hypertensive chronic kidney disease with stage 1 through stage 4 chronic kidney disease, or unspecified chronic kidney disease: Secondary | ICD-10-CM | POA: Diagnosis not present

## 2022-11-07 DIAGNOSIS — E1122 Type 2 diabetes mellitus with diabetic chronic kidney disease: Secondary | ICD-10-CM | POA: Diagnosis not present

## 2022-11-07 DIAGNOSIS — M6281 Muscle weakness (generalized): Secondary | ICD-10-CM | POA: Diagnosis not present

## 2022-11-07 DIAGNOSIS — F419 Anxiety disorder, unspecified: Secondary | ICD-10-CM | POA: Diagnosis not present

## 2022-11-08 DIAGNOSIS — L821 Other seborrheic keratosis: Secondary | ICD-10-CM | POA: Diagnosis not present

## 2022-11-08 DIAGNOSIS — L814 Other melanin hyperpigmentation: Secondary | ICD-10-CM | POA: Diagnosis not present

## 2022-11-08 DIAGNOSIS — D225 Melanocytic nevi of trunk: Secondary | ICD-10-CM | POA: Diagnosis not present

## 2022-11-12 ENCOUNTER — Encounter: Payer: Self-pay | Admitting: *Deleted

## 2022-11-15 ENCOUNTER — Ambulatory Visit: Payer: Medicare Other | Admitting: Family Medicine

## 2022-11-15 ENCOUNTER — Ambulatory Visit (INDEPENDENT_AMBULATORY_CARE_PROVIDER_SITE_OTHER): Payer: Medicare Other | Admitting: Family Medicine

## 2022-11-15 DIAGNOSIS — F419 Anxiety disorder, unspecified: Secondary | ICD-10-CM

## 2022-11-15 DIAGNOSIS — E7800 Pure hypercholesterolemia, unspecified: Secondary | ICD-10-CM

## 2022-11-15 DIAGNOSIS — E042 Nontoxic multinodular goiter: Secondary | ICD-10-CM | POA: Diagnosis not present

## 2022-11-15 DIAGNOSIS — F32A Depression, unspecified: Secondary | ICD-10-CM | POA: Diagnosis not present

## 2022-11-16 DIAGNOSIS — Z1152 Encounter for screening for COVID-19: Secondary | ICD-10-CM | POA: Diagnosis not present

## 2022-11-16 DIAGNOSIS — Z20822 Contact with and (suspected) exposure to covid-19: Secondary | ICD-10-CM | POA: Diagnosis not present

## 2022-11-21 ENCOUNTER — Encounter: Payer: Self-pay | Admitting: Family Medicine

## 2022-11-21 MED ORDER — TRAZODONE HCL 50 MG PO TABS: 50.0000 mg | ORAL_TABLET | Freq: Every day | ORAL | 1 refills | Status: DC

## 2022-11-21 MED ORDER — PRAVASTATIN SODIUM 10 MG PO TABS: 10.0000 mg | ORAL_TABLET | Freq: Every day | ORAL | 1 refills | Status: DC

## 2022-11-21 MED ORDER — SERTRALINE HCL 100 MG PO TABS: 100.0000 mg | ORAL_TABLET | Freq: Every day | ORAL | 1 refills | Status: DC

## 2022-11-21 MED ORDER — BUPROPION HCL ER (XL) 150 MG PO TB24: 150.0000 mg | ORAL_TABLET | Freq: Every day | ORAL | 1 refills | Status: DC

## 2022-11-21 MED ORDER — LEVOTHYROXINE SODIUM 25 MCG PO TABS: 25.0000 ug | ORAL_TABLET | Freq: Every day | ORAL | 1 refills | Status: DC

## 2022-11-21 NOTE — Progress Notes (Signed)
Established Patient Office Visit  Subjective    Patient ID: Jenny Cardenas, female    DOB: [birth year]  Age: 72 y.o. MRN: [id number]  CC: No chief complaint on file.   HPI Jenny Cardenas presents for routine follow up of chronic med issues. Patient denies acute complaints or concerns.    Outpatient Encounter Medications as of 11/15/2022  Medication Sig   albuterol (PROVENTIL) (2.5 MG/3ML) 0.083% nebulizer solution Take 3 mLs (2.5 mg total) by nebulization every 4 (four) hours as needed for wheezing or shortness of breath.   albuterol (VENTOLIN HFA) 108 (90 Base) MCG/ACT inhaler Inhale 1-2 puffs into the lungs every 6 (six) hours as needed for wheezing or shortness of breath. This is a one-time refill from me until the patient establish care with her primary care physician.  Please do not send request for refill to me in the future.   ALPRAZolam (XANAX) 0.5 MG tablet Take 1 tablet (0.5 mg total) by mouth 3 (three) times daily as needed for anxiety.   buPROPion (WELLBUTRIN XL) 150 MG 24 hr tablet Take 1 tablet (150 mg total) by mouth daily.   diphenhydramine-acetaminophen (TYLENOL PM) 25-500 MG TABS tablet Take 2 tablets by mouth at bedtime as needed.   guaiFENesin (MUCINEX) 600 MG 12 hr tablet Take 1 tablet (600 mg total) by mouth 2 (two) times daily. (Patient taking differently: Take 600 mg by mouth 2 (two) times daily as needed for to loosen phlegm or cough.)   ibuprofen (ADVIL) 200 MG tablet Take 200 mg by mouth as needed.   levocetirizine (XYZAL) 5 MG tablet Take 5 mg by mouth daily as needed for allergies.   levothyroxine (SYNTHROID) 25 MCG tablet Take 1 tablet (25 mcg total) by mouth daily before breakfast.   Multiple Vitamins-Minerals (PRESERVISION AREDS 2 PO) Take 1 capsule by mouth in the morning and at bedtime.   pantoprazole (PROTONIX) 40 MG tablet Take 1 tablet (40 mg total) by mouth daily.   pravastatin (PRAVACHOL) 10 MG tablet Take 1 tablet (10 mg total) by mouth daily.    PRESCRIPTION MEDICATION Natrol memory complex daily   Probiotic Product (PROBIOTIC DAILY PO) Take by mouth daily.   sertraline (ZOLOFT) 100 MG tablet Take 1 tablet (100 mg total) by mouth daily.   traMADol (ULTRAM) 50 MG tablet Take 1 tablet (50 mg total) by mouth every 6 (six) hours as needed (mild pain).   traZODone (DESYREL) 50 MG tablet Take 1 tablet (50 mg total) by mouth at bedtime.   [DISCONTINUED] buPROPion (WELLBUTRIN XL) 150 MG 24 hr tablet Take 1 tablet (150 mg total) by mouth daily.   [DISCONTINUED] levothyroxine (SYNTHROID) 25 MCG tablet Take 1 tablet (25 mcg total) by mouth daily before breakfast.   [DISCONTINUED] pravastatin (PRAVACHOL) 10 MG tablet Take 1 tablet (10 mg total) by mouth daily.   [DISCONTINUED] sertraline (ZOLOFT) 100 MG tablet Take 1 tablet (100 mg total) by mouth daily.   [DISCONTINUED] traZODone (DESYREL) 50 MG tablet Take 1 tablet (50 mg total) by mouth at bedtime.   No facility-administered encounter medications on file as of 11/15/2022.    Past Medical History:  Diagnosis Date   ADHD (attention deficit hyperactivity disorder) 2020   ADD   Anxiety    Arthritis    Broken heart syndrome 2013   Carpal tunnel syndrome    Cataract    Chronic kidney disease    Depression    Diabetes mellitus without complication    Type 2  Dysrhythmia 1976   tachycardia   Fall 04/10/2015   GERD (gastroesophageal reflux disease)    Goiter    History of shingles    On abdomen, left ring finger, and left leg; Pt takes Valtrex   Hyperlipidemia    Hypertension    Hypothyroidism    lung ca 04/2021   Obesity    PTSD (post-traumatic stress disorder)    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   Shortness of breath dyspnea    pt stated its related to the back problems she has   Thyroid disease    Ulcer     Past Surgical History:  Procedure Laterality Date   ABDOMINAL HYSTERECTOMY  1996   BRONCHIAL BRUSHINGS  06/29/2021   Procedure: BRONCHIAL BRUSHINGS;  Surgeon: Leslye Peer, MD;  Location: [facility] ENDOSCOPY;  Service: Pulmonary;;   BRONCHIAL NEEDLE ASPIRATION BIOPSY  06/29/2021   Procedure: BRONCHIAL NEEDLE ASPIRATION BIOPSIES;  Surgeon: Leslye Peer, MD;  Location: [facility] ENDOSCOPY;  Service: Pulmonary;;   CATARACT EXTRACTION, BILATERAL     CESAREAN SECTION  1976, 1983, 1984   COLONOSCOPY W/ POLYPECTOMY     EYE SURGERY Bilateral 2014   cataratact and lasik surgery   FIDUCIAL MARKER PLACEMENT  06/29/2021   Procedure: FIDUCIAL DYE MARKING;  Surgeon: Leslye Peer, MD;  Location: [facility] ENDOSCOPY;  Service: Pulmonary;;   INTERCOSTAL NERVE BLOCK  06/29/2021   Procedure: INTERCOSTAL NERVE BLOCK;  Surgeon: Corliss Skains, MD;  Location: [facility] OR;  Service: Thoracic;;   KYPHOPLASTY N/A 06/02/2015   Procedure: KYPHOPLASTY;  Surgeon: Estill Bamberg, MD;  Location: [facility] OR;  Service: Orthopedics;  Laterality: N/A;  Thoracic 3, 8, 10 kyphoplasty   LOBECTOMY  06/29/2021   Procedure: RIGHT UPPER LOBECTOMY;  Surgeon: Corliss Skains, MD;  Location: [facility] OR;  Service: Thoracic;;   LYMPH NODE DISSECTION  06/29/2021   Procedure: LYMPH NODE DISSECTION;  Surgeon: Corliss Skains, MD;  Location: [facility] OR;  Service: Thoracic;;    Family History  Problem Relation Age of Onset   COPD Mother    Depression Mother    Hypertension Mother    Hyperlipidemia Mother    Breast cancer Mother 43   Diverticulosis Mother    Heart disease Father    Dementia Father    Breast cancer Sister 82   Non-Hodgkin's lymphoma Sister    Colon cancer Neg Hx     Social History   Socioeconomic History   Marital status: Widowed    Spouse name: Not on file   Number of children: 2   Years of education: Not on file   Highest education level: Not on file  Occupational History   Not on file  Tobacco Use   Smoking status: Former    Packs/day: 0.50    Years: 4.00    Additional pack years: 0.00    Total pack years: 2.00    Types: Cigarettes    Quit date: 06/28/2016    Years since  quitting: 6.4   Smokeless tobacco: Never  Vaping Use   Vaping Use: Never used  Substance and Sexual Activity   Alcohol use: Not Currently    Comment: rare glass of wine (holidays only)   Drug use: Never   Sexual activity: Not Currently  Other Topics Concern   Not on file  Social History Narrative   Not on file   Social Determinants of Health   Financial Resource Strain: Low Risk  (05/18/2022)   Overall Financial Resource Strain (CARDIA)  Difficulty of Paying Living Expenses: Not hard at all  Food Insecurity: No Food Insecurity (09/22/2022)   Hunger Vital Sign    Worried About Running Out of Food in the Last Year: Never true    Ran Out of Food in the Last Year: Never true  Transportation Needs: No Transportation Needs (09/22/2022)   PRAPARE - Administrator, Civil ServiceTransportation    Lack of Transportation (Medical): No    Lack of Transportation (Non-Medical): No  Physical Activity: Insufficiently Active (05/18/2022)   Exercise Vital Sign    Days of Exercise per Week: 2 days    Minutes of Exercise per Session: 30 min  Stress: Stress Concern Present (05/18/2022)   [employer] - Occupational Stress Questionnaire    Feeling of Stress : To some extent  Social Connections: Moderately Isolated (05/18/2022)   Social Connection and Isolation Panel [NHANES]    Frequency of Communication with Friends and Family: More than three times a week    Frequency of Social Gatherings with Friends and Family: Once a week    Attends Religious Services: More than 4 times per year    Active Member of [employer] Financial[employer]ubs or Organizations: No    Attends BankerClub or Organization Meetings: Never    Marital Status: Widowed  Intimate Partner Violence: Not At Risk (09/22/2022)   Humiliation, Afraid, Rape, and Kick questionnaire    Fear of Current or Ex-Partner: No    Emotionally Abused: No    Physically Abused: No    Sexually Abused: No    Review of Systems  All other systems reviewed and are negative.        Objective    BP (!) 116/58   Pulse 82   Temp 98.1 F (36.7 C) (Oral)   Resp 16   Wt 161 lb (73 kg)   SpO2 96%   BMI 28.52 kg/m   Physical Exam Vitals and nursing note reviewed.  Constitutional:      General: She is not in acute distress. Cardiovascular:     Rate and Rhythm: Normal rate and regular rhythm.  Pulmonary:     Effort: Pulmonary effort is normal.     Breath sounds: Normal breath sounds.  Abdominal:     Palpations: Abdomen is soft.     Tenderness: There is no abdominal tenderness.  Neurological:     General: No focal deficit present.     Mental Status: She is alert and oriented to person, place, and time.         Assessment & Plan:   1. Anxiety and depression Meds refilled. Continue. Appears stable. - sertraline (ZOLOFT) 100 MG tablet; Take 1 tablet (100 mg total) by mouth daily.  Dispense: 90 tablet; Refill: 1 - buPROPion (WELLBUTRIN XL) 150 MG 24 hr tablet; Take 1 tablet (150 mg total) by mouth daily.  Dispense: 90 tablet; Refill: 1  2. Pure hypercholesterolemia Continue. Meds refilled.  - pravastatin (PRAVACHOL) 10 MG tablet; Take 1 tablet (10 mg total) by mouth daily.  Dispense: 90 tablet; Refill: 1  3. Multinodular goiter Continue meds refilled.  - levothyroxine (SYNTHROID) 25 MCG tablet; Take 1 tablet (25 mcg total) by mouth daily before breakfast.  Dispense: 90 tablet; Refill: 1    Return in about 3 months (around 02/14/2023) for follow up.   Tommie RaymondWilson, Laquanna Veazey P, MD

## 2022-12-06 ENCOUNTER — Telehealth: Payer: Self-pay | Admitting: Internal Medicine

## 2022-12-06 NOTE — Telephone Encounter (Signed)
Patient called to check time and date of her appointment.

## 2022-12-06 NOTE — Telephone Encounter (Signed)
Patient left voicemail regarding her upcoming appointments, left voicemail.

## 2023-02-19 ENCOUNTER — Ambulatory Visit (INDEPENDENT_AMBULATORY_CARE_PROVIDER_SITE_OTHER): Payer: Medicare Other | Admitting: Family Medicine

## 2023-02-19 ENCOUNTER — Encounter: Payer: Self-pay | Admitting: Family Medicine

## 2023-02-19 VITALS — BP 123/78 | HR 78 | Temp 98.1°F | Resp 16 | Wt 159.0 lb

## 2023-02-19 DIAGNOSIS — Z794 Long term (current) use of insulin: Secondary | ICD-10-CM

## 2023-02-19 DIAGNOSIS — F32A Depression, unspecified: Secondary | ICD-10-CM

## 2023-02-19 DIAGNOSIS — F419 Anxiety disorder, unspecified: Secondary | ICD-10-CM

## 2023-02-19 DIAGNOSIS — E1169 Type 2 diabetes mellitus with other specified complication: Secondary | ICD-10-CM | POA: Diagnosis not present

## 2023-02-19 DIAGNOSIS — I10 Essential (primary) hypertension: Secondary | ICD-10-CM | POA: Diagnosis not present

## 2023-02-19 DIAGNOSIS — R413 Other amnesia: Secondary | ICD-10-CM

## 2023-02-19 NOTE — Progress Notes (Unsigned)
Patient is here for their 3 month follow-up Patient has no concerns today Care gaps have been discussed with patient  

## 2023-02-19 NOTE — Progress Notes (Unsigned)
Established Patient Office Visit  Subjective    Patient ID: Jenny Cardenas, female    DOB: 1950/09/09  Age: 72 y.o. MRN: [id number]  CC: No chief complaint on file.   HPI Jenny Cardenas presents for follow up of chronic med issues. Patient also reports that she feels that she is having memory difficulties.    Outpatient Encounter Medications as of 02/19/2023  Medication Sig   albuterol (PROVENTIL) (2.5 MG/3ML) 0.083% nebulizer solution Take 3 mLs (2.5 mg total) by nebulization every 4 (four) hours as needed for wheezing or shortness of breath.   albuterol (VENTOLIN HFA) 108 (90 Base) MCG/ACT inhaler Inhale 1-2 puffs into the lungs every 6 (six) hours as needed for wheezing or shortness of breath. This is a one-time refill from me until the patient establish care with her primary care physician.  Please do not send request for refill to me in the future.   ALPRAZolam (XANAX) 0.5 MG tablet Take 1 tablet (0.5 mg total) by mouth 3 (three) times daily as needed for anxiety.   buPROPion (WELLBUTRIN XL) 150 MG 24 hr tablet Take 1 tablet (150 mg total) by mouth daily.   diphenhydramine-acetaminophen (TYLENOL PM) 25-500 MG TABS tablet Take 2 tablets by mouth at bedtime as needed.   guaiFENesin (MUCINEX) 600 MG 12 hr tablet Take 1 tablet (600 mg total) by mouth 2 (two) times daily. (Patient taking differently: Take 600 mg by mouth 2 (two) times daily as needed for to loosen phlegm or cough.)   ibuprofen (ADVIL) 200 MG tablet Take 200 mg by mouth as needed.   levocetirizine (XYZAL) 5 MG tablet Take 5 mg by mouth daily as needed for allergies.   levothyroxine (SYNTHROID) 25 MCG tablet Take 1 tablet (25 mcg total) by mouth daily before breakfast.   Multiple Vitamins-Minerals (PRESERVISION AREDS 2 PO) Take 1 capsule by mouth in the morning and at bedtime.   pantoprazole (PROTONIX) 40 MG tablet Take 1 tablet (40 mg total) by mouth daily.   pravastatin (PRAVACHOL) 10 MG tablet Take 1 tablet (10 mg total) by  mouth daily.   PRESCRIPTION MEDICATION Natrol memory complex daily   Probiotic Product (PROBIOTIC DAILY PO) Take by mouth daily.   sertraline (ZOLOFT) 100 MG tablet Take 1 tablet (100 mg total) by mouth daily.   traMADol (ULTRAM) 50 MG tablet Take 1 tablet (50 mg total) by mouth every 6 (six) hours as needed (mild pain).   traZODone (DESYREL) 50 MG tablet Take 1 tablet (50 mg total) by mouth at bedtime.   No facility-administered encounter medications on file as of 02/19/2023.    Past Medical History:  Diagnosis Date   ADHD (attention deficit hyperactivity disorder) 2020   ADD   Anxiety    Arthritis    Broken heart syndrome 2013   Carpal tunnel syndrome    Cataract    Chronic kidney disease    Depression    Diabetes mellitus without complication (HCC)    Type 2   Dysrhythmia 1976   tachycardia   Fall 04/10/2015   GERD (gastroesophageal reflux disease)    Goiter    History of shingles    On abdomen, left ring finger, and left leg; Pt takes Valtrex   Hyperlipidemia    Hypertension    Hypothyroidism    lung ca 04/2021   Obesity    PTSD (post-traumatic stress disorder)    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   Shortness of breath dyspnea  pt stated its related to the back problems she has   Thyroid disease    Ulcer     Past Surgical History:  Procedure Laterality Date   ABDOMINAL HYSTERECTOMY  1996   BRONCHIAL BRUSHINGS  06/29/2021   Procedure: BRONCHIAL BRUSHINGS;  Surgeon: Leslye Peer, MD;  Location: [facility] ENDOSCOPY;  Service: Pulmonary;;   BRONCHIAL NEEDLE ASPIRATION BIOPSY  06/29/2021   Procedure: BRONCHIAL NEEDLE ASPIRATION BIOPSIES;  Surgeon: Leslye Peer, MD;  Location: [facility] ENDOSCOPY;  Service: Pulmonary;;   CATARACT EXTRACTION, BILATERAL     CESAREAN SECTION  1976, 1983, 1984   COLONOSCOPY W/ POLYPECTOMY     EYE SURGERY Bilateral 2014   cataratact and lasik surgery   FIDUCIAL MARKER PLACEMENT  06/29/2021   Procedure: FIDUCIAL DYE MARKING;  Surgeon: Leslye Peer, MD;  Location: [facility] ENDOSCOPY;  Service: Pulmonary;;   INTERCOSTAL NERVE BLOCK  06/29/2021   Procedure: INTERCOSTAL NERVE BLOCK;  Surgeon: Corliss Skains, MD;  Location: [facility] OR;  Service: Thoracic;;   KYPHOPLASTY N/A 06/02/2015   Procedure: KYPHOPLASTY;  Surgeon: Estill Bamberg, MD;  Location: [facility] OR;  Service: Orthopedics;  Laterality: N/A;  Thoracic 3, 8, 10 kyphoplasty   LOBECTOMY  06/29/2021   Procedure: RIGHT UPPER LOBECTOMY;  Surgeon: Corliss Skains, MD;  Location: [facility] OR;  Service: Thoracic;;   LYMPH NODE DISSECTION  06/29/2021   Procedure: LYMPH NODE DISSECTION;  Surgeon: Corliss Skains, MD;  Location: [facility] OR;  Service: Thoracic;;    Family History  Problem Relation Age of Onset   COPD Mother    Depression Mother    Hypertension Mother    Hyperlipidemia Mother    Breast cancer Mother 67   Diverticulosis Mother    Heart disease Father    Dementia Father    Breast cancer Sister 95   Non-Hodgkin's lymphoma Sister    Colon cancer Neg Hx     Social History   Socioeconomic History   Marital status: Widowed    Spouse name: Not on file   Number of children: 2   Years of education: Not on file   Highest education level: Not on file  Occupational History   Not on file  Tobacco Use   Smoking status: Former    Packs/day: 0.50    Years: 4.00    Additional pack years: 0.00    Total pack years: 2.00    Types: Cigarettes    Quit date: 06/28/2016    Years since quitting: 6.6   Smokeless tobacco: Never  Vaping Use   Vaping Use: Never used  Substance and Sexual Activity   Alcohol use: Not Currently    Comment: rare glass of wine (holidays only)   Drug use: Never   Sexual activity: Not Currently  Other Topics Concern   Not on file  Social History Narrative   Not on file   Social Determinants of Health   Financial Resource Strain: Low Risk  (05/18/2022)   Overall Financial Resource Strain (CARDIA)    Difficulty of Paying Living Expenses: Not hard  at all  Food Insecurity: No Food Insecurity (09/22/2022)   Hunger Vital Sign    Worried About Running Out of Food in the Last Year: Never true    Ran Out of Food in the Last Year: Never true  Transportation Needs: No Transportation Needs (09/22/2022)   PRAPARE - Administrator, Civil Service (Medical): No    Lack of Transportation (Non-Medical): No  Physical Activity: Insufficiently Active (  05/18/2022)   Exercise Vital Sign    Days of Exercise per Week: 2 days    Minutes of Exercise per Session: 30 min  Stress: Stress Concern Present (05/18/2022)   [employer] of Occupational Health - Occupational Stress Questionnaire    Feeling of Stress : To some extent  Social Connections: Moderately Isolated (05/18/2022)   Social Connection and Isolation Panel [NHANES]    Frequency of Communication with Friends and Family: More than three times a week    Frequency of Social Gatherings with Friends and Family: Once a week    Attends Religious Services: More than 4 times per year    Active Member of [employer] or Organizations: No    Attends Banker Meetings: Never    Marital Status: Widowed  Intimate Partner Violence: Not At Risk (09/22/2022)   Humiliation, Afraid, Rape, and Kick questionnaire    Fear of Current or Ex-Partner: No    Emotionally Abused: No    Physically Abused: No    Sexually Abused: No    Review of Systems  All other systems reviewed and are negative.       Objective    BP 123/78   Pulse 78   Temp 98.1 F (36.7 C) (Oral)   Resp 16   Wt 159 lb (72.1 kg)   SpO2 96%   BMI 28.17 kg/m   Physical Exam Vitals and nursing note reviewed.  Constitutional:      General: She is not in acute distress. Cardiovascular:     Rate and Rhythm: Normal rate and regular rhythm.  Pulmonary:     Effort: Pulmonary effort is normal.     Breath sounds: Normal breath sounds.  Abdominal:     Palpations: Abdomen is soft.     Tenderness: There is no abdominal  tenderness.  Neurological:     General: No focal deficit present.     Mental Status: She is alert and oriented to person, place, and time.         Assessment & Plan:   1. Type 2 diabetes mellitus with other specified complication, with long-term current use of insulin (HCC) Continue  - Microalbumin / creatinine urine ratio  2. Essential hypertension Appears stable  3. Memory difficulties Referral for further eval/mgt - Ambulatory referral to Neurology  4. Anxiety and depression Appears stable. continue    No follow-ups on file.   Tommie Raymond, MD

## 2023-02-20 LAB — MICROALBUMIN / CREATININE URINE RATIO
Creatinine, Urine: 191.5 mg/dL
Microalb/Creat Ratio: 54 mg/g creat — ABNORMAL HIGH (ref 0–29)
Microalbumin, Urine: 103.1 ug/mL

## 2023-02-25 ENCOUNTER — Telehealth: Payer: Self-pay | Admitting: Family Medicine

## 2023-02-25 NOTE — Telephone Encounter (Signed)
Copied from CRM [id number]. Topic: General - Other >> Feb 25, 2023 10:27 AM Macon Large wrote: Reason for CRM: Pt requests handicap placard. Pt stated her mother is 49 and she is her primary care giver and she has trouble walking far distances. Cb# [phone]

## 2023-02-25 NOTE — Telephone Encounter (Signed)
Form given to pcp to fill out. ?

## 2023-04-05 ENCOUNTER — Other Ambulatory Visit: Payer: Self-pay | Admitting: *Deleted

## 2023-04-05 ENCOUNTER — Telehealth: Payer: Self-pay | Admitting: Family Medicine

## 2023-04-05 DIAGNOSIS — E042 Nontoxic multinodular goiter: Secondary | ICD-10-CM

## 2023-04-05 MED ORDER — LEVOTHYROXINE SODIUM 25 MCG PO TABS: 25.0000 ug | ORAL_TABLET | Freq: Every day | ORAL | 1 refills | Status: DC

## 2023-04-05 NOTE — Telephone Encounter (Signed)
Pt has only 4 left of Levothyroxine and wants a small supply sent to [facility] pharmacy until her refill supply from delivery pharmacy gets to her.

## 2023-04-05 NOTE — Telephone Encounter (Signed)
Pt wants a refill for Levothyroxine sent to [facility].

## 2023-04-05 NOTE — Telephone Encounter (Signed)
Done

## 2023-04-08 ENCOUNTER — Other Ambulatory Visit: Payer: Self-pay | Admitting: *Deleted

## 2023-04-08 ENCOUNTER — Telehealth: Payer: Self-pay | Admitting: Family Medicine

## 2023-04-08 ENCOUNTER — Ambulatory Visit: Payer: Self-pay

## 2023-04-08 DIAGNOSIS — E042 Nontoxic multinodular goiter: Secondary | ICD-10-CM

## 2023-04-08 MED ORDER — LEVOTHYROXINE SODIUM 25 MCG PO TABS: 25.0000 ug | ORAL_TABLET | Freq: Every day | ORAL | 0 refills | Status: DC

## 2023-04-08 NOTE — Telephone Encounter (Signed)
Medication has been sent to pharmacy for 30 days

## 2023-04-08 NOTE — Telephone Encounter (Signed)
Second attempt to reach pt. Left message to call back. 

## 2023-04-08 NOTE — Telephone Encounter (Signed)
Copied from CRM [id number]. Topic: General - Other >> Apr 08, 2023  1:50 PM Jenny Cardenas wrote: Reason for CRM: Pt states that she has switched insurance to [facility], that will deliver her medication. Pt is wanting to see if the PCP can send her medication list to   [facility] - [place], [place] - [address]  Phone: [phone] Fax: [phone]

## 2023-04-08 NOTE — Telephone Encounter (Signed)
Able to reach pt. States breakout of Eczema right  leg, from ankle to calf. "Burns and itching." States she has been using her mothers medication "Fluocinonide." States that is the only thing that helps. Also states she has pictures of breakout. States would like all meds sent to mail order unsure of exact place "But will find out in meantime." After hours call. Please advise.

## 2023-04-08 NOTE — Telephone Encounter (Addendum)
Summary: Eczema?   Pt is calling to see if her PCP will call in medication for her Eczema. Pt is wanting Fluocinonide called in for her. Pt states that her lets feel like they are on fire from her Eczema. Please advise.   No answer.

## 2023-04-08 NOTE — Telephone Encounter (Signed)
I have attempted without success to contact this patient by phone to I left a message on answering machine.

## 2023-04-09 ENCOUNTER — Other Ambulatory Visit: Payer: Self-pay | Admitting: Family Medicine

## 2023-04-09 ENCOUNTER — Telehealth: Payer: Self-pay | Admitting: Physician Assistant

## 2023-04-09 NOTE — Telephone Encounter (Signed)
Medication Refill - Medication:  traZODone (DESYREL) 50 MG tablet less than 10 tablets  ALSO NOTE: Patient wanted to make sure all medications are UP TO DATE on refills via [facility]  Has the patient contacted their pharmacy? Yes.  Advised to contact PCP   Preferred Pharmacy (with phone number or street name):  [facility] - [place], [place] - [address]  Phone: [phone] Fax: [phone]   Has the patient been seen for an appointment in the last year OR does the patient have an upcoming appointment? YES  follow up on 05/23/2023

## 2023-04-09 NOTE — Telephone Encounter (Signed)
I have attempted without success to contact this patient by phone to return their call and I left a message on answering machine.

## 2023-04-09 NOTE — Telephone Encounter (Signed)
Called patient regarding rescheduled September appointments, left a voicemail.

## 2023-04-10 MED ORDER — TRAZODONE HCL 50 MG PO TABS: 50.0000 mg | ORAL_TABLET | Freq: Every day | ORAL | 1 refills | Status: DC

## 2023-04-10 NOTE — Telephone Encounter (Signed)
Requested Prescriptions  Pending Prescriptions Disp Refills   traZODone (DESYREL) 50 MG tablet 90 tablet 1    Sig: Take 1 tablet (50 mg total) by mouth at bedtime.     Psychiatry: Antidepressants - Serotonin Modulator Passed - 04/09/2023 11:33 AM      Passed - Completed PHQ-2 or PHQ-9 in the last 360 days      Passed - Valid encounter within last 6 months    Recent Outpatient Visits           1 month ago Type 2 diabetes mellitus with other specified complication, with long-term current use of insulin (HCC)   [place] [facility], MD   4 months ago Anxiety and depression   [place] [facility], Lauris Poag, MD   6 months ago Diabetes mellitus type 2 in obese [facility])   [place] [facility], MD   8 months ago Essential hypertension   [place] [facility], MD   11 months ago Prediabetes   [place] [facility], MD       Future Appointments             In 1 month Georganna Skeans, MD [facility]

## 2023-04-11 ENCOUNTER — Telehealth: Payer: Self-pay | Admitting: Family Medicine

## 2023-04-11 ENCOUNTER — Other Ambulatory Visit: Payer: Self-pay | Admitting: *Deleted

## 2023-04-11 DIAGNOSIS — F419 Anxiety disorder, unspecified: Secondary | ICD-10-CM

## 2023-04-11 DIAGNOSIS — M62838 Other muscle spasm: Secondary | ICD-10-CM

## 2023-04-11 DIAGNOSIS — E042 Nontoxic multinodular goiter: Secondary | ICD-10-CM

## 2023-04-11 DIAGNOSIS — E7800 Pure hypercholesterolemia, unspecified: Secondary | ICD-10-CM

## 2023-04-11 MED ORDER — LEVOTHYROXINE SODIUM 25 MCG PO TABS: 25.0000 ug | ORAL_TABLET | Freq: Every day | ORAL | 0 refills | Status: DC

## 2023-04-11 MED ORDER — SERTRALINE HCL 100 MG PO TABS: 100.0000 mg | ORAL_TABLET | Freq: Every day | ORAL | 1 refills | Status: DC

## 2023-04-11 MED ORDER — PRAVASTATIN SODIUM 10 MG PO TABS: 10.0000 mg | ORAL_TABLET | Freq: Every day | ORAL | 1 refills | Status: DC

## 2023-04-11 MED ORDER — BUPROPION HCL ER (XL) 150 MG PO TB24: 150.0000 mg | ORAL_TABLET | Freq: Every day | ORAL | 1 refills | Status: DC

## 2023-04-11 NOTE — Telephone Encounter (Signed)
Call dropped, I called pt back and gave her the info, that meds have been sent to [facility]

## 2023-04-11 NOTE — Telephone Encounter (Signed)
Copied from CRM [id number]. Topic: General - Other >> Apr 10, 2023  3:52 PM Ja-Kwan M wrote: Reason for CRM: Pt requests to speak with Magda Paganini. Cb# [phone]

## 2023-04-11 NOTE — Telephone Encounter (Signed)
I spoke with patient and medication has been ordered.

## 2023-04-22 ENCOUNTER — Ambulatory Visit (HOSPITAL_COMMUNITY)
Admission: RE | Admit: 2023-04-22 | Discharge: 2023-04-22 | Disposition: A | Discharge status: Home / Self Care | Payer: Medicare PPO | Source: Ambulatory Visit | Attending: Internal Medicine | Admitting: Internal Medicine

## 2023-04-22 ENCOUNTER — Inpatient Hospital Stay: Payer: PRIVATE HEALTH INSURANCE

## 2023-04-22 ENCOUNTER — Inpatient Hospital Stay: Payer: Medicare PPO | Attending: Internal Medicine

## 2023-04-22 ENCOUNTER — Encounter: Payer: Self-pay | Admitting: Internal Medicine

## 2023-04-22 DIAGNOSIS — C3490 Malignant neoplasm of unspecified part of unspecified bronchus or lung: Secondary | ICD-10-CM | POA: Insufficient documentation

## 2023-04-22 DIAGNOSIS — Z902 Acquired absence of lung [part of]: Secondary | ICD-10-CM | POA: Insufficient documentation

## 2023-04-22 DIAGNOSIS — Z79899 Other long term (current) drug therapy: Secondary | ICD-10-CM | POA: Insufficient documentation

## 2023-04-22 DIAGNOSIS — I700 Atherosclerosis of aorta: Secondary | ICD-10-CM | POA: Diagnosis not present

## 2023-04-22 DIAGNOSIS — J984 Other disorders of lung: Secondary | ICD-10-CM | POA: Diagnosis not present

## 2023-04-22 DIAGNOSIS — E041 Nontoxic single thyroid nodule: Secondary | ICD-10-CM | POA: Diagnosis not present

## 2023-04-22 DIAGNOSIS — Z85118 Personal history of other malignant neoplasm of bronchus and lung: Secondary | ICD-10-CM | POA: Insufficient documentation

## 2023-04-22 LAB — CMP (CANCER CENTER ONLY)
ALT: 12 U/L (ref 0–44)
AST: 18 U/L (ref 15–41)
Albumin: 4.2 g/dL (ref 3.5–5.0)
Alkaline Phosphatase: 77 U/L (ref 38–126)
Anion gap: 7 (ref 5–15)
BUN: 17 mg/dL (ref 8–23)
CO2: 27 mmol/L (ref 22–32)
Calcium: 9.2 mg/dL (ref 8.9–10.3)
Chloride: 107 mmol/L (ref 98–111)
Creatinine: 1.11 mg/dL — ABNORMAL HIGH (ref 0.44–1.00)
GFR, Estimated: 52 mL/min — ABNORMAL LOW (ref >60)
Glucose, Bld: 108 mg/dL — ABNORMAL HIGH (ref 70–99)
Potassium: 4.5 mmol/L (ref 3.5–5.1)
Sodium: 141 mmol/L (ref 135–145)
Total Bilirubin: 0.6 mg/dL (ref 0.3–1.2)
Total Protein: 7.1 g/dL (ref 6.5–8.1)

## 2023-04-22 LAB — CBC WITH DIFFERENTIAL (CANCER CENTER ONLY)
Abs Immature Granulocytes: 0.02 10*3/uL (ref 0.00–0.07)
Basophils Absolute: 0 10*3/uL (ref 0.0–0.1)
Basophils Relative: 1 %
Eosinophils Absolute: 0.2 10*3/uL (ref 0.0–0.5)
Eosinophils Relative: 2 %
HCT: 39.2 % (ref 36.0–46.0)
Hemoglobin: 13 g/dL (ref 12.0–15.0)
Immature Granulocytes: 0 %
Lymphocytes Relative: 27 %
Lymphs Abs: 2.2 10*3/uL (ref 0.7–4.0)
MCH: 29.1 pg (ref 26.0–34.0)
MCHC: 33.2 g/dL (ref 30.0–36.0)
MCV: 87.7 fL (ref 80.0–100.0)
Monocytes Absolute: 0.4 10*3/uL (ref 0.1–1.0)
Monocytes Relative: 5 %
Neutro Abs: 5.5 10*3/uL (ref 1.7–7.7)
Neutrophils Relative %: 65 %
Platelet Count: 263 10*3/uL (ref 150–400)
RBC: 4.47 MIL/uL (ref 3.87–5.11)
RDW: 12.6 % (ref 11.5–15.5)
WBC Count: 8.4 10*3/uL (ref 4.0–10.5)
nRBC: 0 % (ref 0.0–0.2)

## 2023-04-22 MED ORDER — IOHEXOL 300 MG/ML  SOLN
75.0000 mL | Freq: Once | INTRAMUSCULAR | Status: AC | PRN
  Administered 2023-04-22: 75 mL via INTRAVENOUS

## 2023-04-22 MED ORDER — SODIUM CHLORIDE (PF) 0.9 % IJ SOLN
INTRAMUSCULAR | Status: AC
  Filled 2023-04-22: qty 50

## 2023-04-24 ENCOUNTER — Inpatient Hospital Stay: Payer: PRIVATE HEALTH INSURANCE | Admitting: Internal Medicine

## 2023-04-26 ENCOUNTER — Telehealth: Payer: Self-pay

## 2023-04-26 NOTE — Telephone Encounter (Signed)
Pt called and wanted to keep An appointment that was schedule but change the times, due to her not having someone to watch her elderly mother. I was able to get that time change for her. Pt verbalize understanding.  Rondel Jumbo, CMA

## 2023-04-27 NOTE — Progress Notes (Unsigned)
[facility] OFFICE PROGRESS NOTE  Georganna Skeans, MD [address] [place] [address]  DIAGNOSIS: Stage IA (T1 a, N0, M0) non-small cell lung cancer, adenocarcinoma   PRIOR THERAPY:  status post right upper lobectomy with lymph node dissection under the care of Dr. Cliffton Asters on June 29, 2021.  The tumor size was 0.8 cm.   CURRENT THERAPY: Observation   INTERVAL HISTORY: ROSELLEN HILLESTAD 72 y.o. female returns to the clinic today for a 66-month follow-up visit accompanied by her daughter. She was last seen by Dr. Arbutus Ped in March 2024.  She has a history of stage I non-small cell lung cancer and she is currently on observation and feeling well.  She tells me that she is seeing a neurologist later this month due to some memory concerns.  The patient was hospitalized due to severe dehydration from Lasix and electrolyte derangements and she states that she has never been quite back to her baseline since that time cognitively.  There is a 6 mm pleural-based nodule that Dr. Arbutus Ped is monitoring closely, her most recent CT scan of the chest mention that it has been stable and is likely benign.  She denies any fever, chills, night sweats, or unexplained weight loss.  She reports stable dyspnea on exertion without any significant chest pain, cough, or hemoptysis.  She lives with her 34 year old mother and the patient does all the house hold chores and has several acres of property.  She denies any nausea, vomiting, diarrhea, or constipation.  The patient has chronic frequent headaches which have been going on for several years.  Her most recent brain MRI was in February 2024 without any concerns of any metastatic disease.  She recently had a restaging CT scan performed.  She is here today for evaluation to review her scan results.   MEDICAL HISTORY: Past Medical History:  Diagnosis Date   ADHD (attention deficit hyperactivity disorder) 2020   ADD   Anxiety    Arthritis     Broken heart syndrome 2013   Carpal tunnel syndrome    Cataract    Chronic kidney disease    Depression    Diabetes mellitus without complication (HCC)    Type 2   Dysrhythmia 1976   tachycardia   Fall 04/10/2015   GERD (gastroesophageal reflux disease)    Goiter    History of shingles    On abdomen, left ring finger, and left leg; Pt takes Valtrex   Hyperlipidemia    Hypertension    Hypothyroidism    lung ca 04/2021   Obesity    PTSD (post-traumatic stress disorder)    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   Shortness of breath dyspnea    pt stated its related to the back problems she has   Thyroid disease    Ulcer     ALLERGIES:  is allergic to ethylene oxide and other.  MEDICATIONS:  Current Outpatient Medications  Medication Sig Dispense Refill   fluocinonide cream (LIDEX) 0.05 % Apply 1 Application topically 2 (two) times daily.     Ibuprofen-diphenhydrAMINE Cit (MOTRIN PM PO) Take 1 tablet by mouth at bedtime as needed.     albuterol (PROVENTIL) (2.5 MG/3ML) 0.083% nebulizer solution Take 3 mLs (2.5 mg total) by nebulization every 4 (four) hours as needed for wheezing or shortness of breath. 75 mL 10   albuterol (VENTOLIN HFA) 108 (90 Base) MCG/ACT inhaler Inhale 1-2 puffs into the lungs every 6 (six) hours as  needed for wheezing or shortness of breath. This is a one-time refill from me until the patient establish care with her primary care physician.  Please do not send request for refill to me in the future. 8 g 0   ALPRAZolam (XANAX) 0.5 MG tablet Take 1 tablet (0.5 mg total) by mouth 3 (three) times daily as needed for anxiety. 10 tablet 0   buPROPion (WELLBUTRIN XL) 150 MG 24 hr tablet Take 1 tablet (150 mg total) by mouth daily. 90 tablet 1   ibuprofen (ADVIL) 200 MG tablet Take 200 mg by mouth as needed.     levocetirizine (XYZAL) 5 MG tablet Take 5 mg by mouth daily as needed for allergies.     levothyroxine (SYNTHROID) 25 MCG tablet Take 1 tablet (25 mcg total)  by mouth daily before breakfast. 90 tablet 0   Multiple Vitamins-Minerals (PRESERVISION AREDS 2 PO) Take 1 capsule by mouth in the morning and at bedtime.     pantoprazole (PROTONIX) 40 MG tablet Take 1 tablet (40 mg total) by mouth daily. 90 tablet 1   pravastatin (PRAVACHOL) 10 MG tablet Take 1 tablet (10 mg total) by mouth daily. 90 tablet 1   PRESCRIPTION MEDICATION Natrol memory complex daily     Probiotic Product (PROBIOTIC DAILY PO) Take by mouth daily.     sertraline (ZOLOFT) 100 MG tablet Take 1 tablet (100 mg total) by mouth daily. 90 tablet 1   traMADol (ULTRAM) 50 MG tablet Take 1 tablet (50 mg total) by mouth every 6 (six) hours as needed (mild pain). (Patient not taking: Reported on 05/02/2023) 30 tablet 0   traZODone (DESYREL) 50 MG tablet Take 1 tablet (50 mg total) by mouth at bedtime. 90 tablet 1   No current facility-administered medications for this visit.    SURGICAL HISTORY:  Past Surgical History:  Procedure Laterality Date   ABDOMINAL HYSTERECTOMY  1996   BRONCHIAL BRUSHINGS  06/29/2021   Procedure: BRONCHIAL BRUSHINGS;  Surgeon: Leslye Peer, MD;  Location: [facility] ENDOSCOPY;  Service: Pulmonary;;   BRONCHIAL NEEDLE ASPIRATION BIOPSY  06/29/2021   Procedure: BRONCHIAL NEEDLE ASPIRATION BIOPSIES;  Surgeon: Leslye Peer, MD;  Location: [facility] ENDOSCOPY;  Service: Pulmonary;;   CATARACT EXTRACTION, BILATERAL     CESAREAN SECTION  1976, 1983, 1984   COLONOSCOPY W/ POLYPECTOMY     EYE SURGERY Bilateral 2014   cataratact and lasik surgery   FIDUCIAL MARKER PLACEMENT  06/29/2021   Procedure: FIDUCIAL DYE MARKING;  Surgeon: Leslye Peer, MD;  Location: [facility] ENDOSCOPY;  Service: Pulmonary;;   INTERCOSTAL NERVE BLOCK  06/29/2021   Procedure: INTERCOSTAL NERVE BLOCK;  Surgeon: Corliss Skains, MD;  Location: [facility] OR;  Service: Thoracic;;   KYPHOPLASTY N/A 06/02/2015   Procedure: KYPHOPLASTY;  Surgeon: Estill Bamberg, MD;  Location: [facility] OR;  Service: Orthopedics;   Laterality: N/A;  Thoracic 3, 8, 10 kyphoplasty   LOBECTOMY  06/29/2021   Procedure: RIGHT UPPER LOBECTOMY;  Surgeon: Corliss Skains, MD;  Location: [facility] OR;  Service: Thoracic;;   LYMPH NODE DISSECTION  06/29/2021   Procedure: LYMPH NODE DISSECTION;  Surgeon: Corliss Skains, MD;  Location: [facility] OR;  Service: Thoracic;;    REVIEW OF SYSTEMS:   Review of Systems  Constitutional: Negative for appetite change, chills, fatigue, fever and unexpected weight change.  HENT: Negative for mouth sores, nosebleeds, sore throat and trouble swallowing.   Eyes: Negative for eye problems and icterus.  Respiratory: Positive for mild stable dyspnea on exertion.  Negative for cough, hemoptysis, and wheezing.   Cardiovascular: Negative for chest pain and leg swelling.  Gastrointestinal: Negative for abdominal pain, constipation, diarrhea, nausea and vomiting.  Genitourinary: Negative for bladder incontinence, difficulty urinating, dysuria, frequency and hematuria.   Musculoskeletal: Negative for back pain, gait problem, neck pain and neck stiffness.  Skin: Negative for itching and rash.  Neurological: Positive for chronic frequent headaches.  Negative for dizziness, extremity weakness, gait problem, light-headedness and seizures.  Hematological: Negative for adenopathy. Does not bruise/bleed easily.  Psychiatric/Behavioral: Negative for confusion, depression and sleep disturbance. The patient is not nervous/anxious.     PHYSICAL EXAMINATION:  There were no vitals taken for this visit.  ECOG PERFORMANCE STATUS: 1  Physical Exam  Constitutional: Oriented to person, place, and time and well-developed, well-nourished, and in no distress.  HENT:  Head: Normocephalic and atraumatic.  Mouth/Throat: Oropharynx is clear and moist. No oropharyngeal exudate.  Eyes: Conjunctivae are normal. Right eye exhibits no discharge. Left eye exhibits no discharge. No scleral icterus.  Neck: Normal range of motion.  Neck supple.  Cardiovascular: Normal rate, regular rhythm, normal heart sounds and intact distal pulses.   Pulmonary/Chest: Effort normal and breath sounds normal. No respiratory distress. No wheezes. No rales.  Abdominal: Soft. Bowel sounds are normal. Exhibits no distension and no mass. There is no tenderness.  Musculoskeletal: Normal range of motion. Exhibits no edema.  Lymphadenopathy:    No cervical adenopathy.  Neurological: Alert and oriented to person, place, and time. Exhibits normal muscle tone. Gait normal. Coordination normal.  Skin: Skin is warm and dry. No rash noted. Not diaphoretic. No erythema. No pallor.  Psychiatric: Mood, memory and judgment normal.  Vitals reviewed.  LABORATORY DATA: Lab Results  Component Value Date   WBC 8.4 04/22/2023   HGB 13.0 04/22/2023   HCT 39.2 04/22/2023   MCV 87.7 04/22/2023   PLT 263 04/22/2023      Chemistry      Component Value Date/Time   NA 141 04/22/2023 1311   K 4.5 04/22/2023 1311   CL 107 04/22/2023 1311   CO2 27 04/22/2023 1311   BUN 17 04/22/2023 1311   CREATININE 1.11 (H) 04/22/2023 1311   CREATININE 0.83 10/26/2016 1039      Component Value Date/Time   CALCIUM 9.2 04/22/2023 1311   ALKPHOS 77 04/22/2023 1311   AST 18 04/22/2023 1311   ALT 12 04/22/2023 1311   BILITOT 0.6 04/22/2023 1311       RADIOGRAPHIC STUDIES:  CT Chest W Contrast  Result Date: 05/02/2023 CLINICAL DATA:  Non-small cell lung cancer (NSCLC), staging. * Tracking Code: BO * EXAM: CT CHEST WITH CONTRAST TECHNIQUE: Multidetector CT imaging of the chest was performed during intravenous contrast administration. RADIATION DOSE REDUCTION: This exam was performed according to the departmental dose-optimization program which includes automated exposure control, adjustment of the mA and/or kV according to patient size and/or use of iterative reconstruction technique. CONTRAST:  75mL OMNIPAQUE IOHEXOL 300 MG/ML  SOLN COMPARISON:  CT scan chest from  10/23/2022. FINDINGS: Cardiovascular: Normal cardiac size. No pericardial effusion. Small amount of fluid is again seen in the periaortic recesses. No aortic aneurysm. There are coronary artery calcifications, in keeping with coronary artery disease. There are also mild peripheral atherosclerotic vascular calcifications of thoracic aorta and its major branches. Mediastinum/Nodes: There are at least 1 each hypoattenuating nodules in the both thyroid lobes with largest in the right superior thyroid lobe measuring up to 1.5 x 1.7 cm, which exhibits central calcifications.  This nodule is incompletely characterized on the current examination and appears similar to the prior study however, meets the criteria for follow-up nonemergent thyroid ultrasound exam, unless recently performed. No solid / cystic mediastinal masses. The esophagus is nondistended precluding optimal assessment. No axillary, mediastinal or hilar lymphadenopathy by size criteria. Lungs/Pleura: The central tracheo-bronchial tree is patent. Stable postsurgical changes in the right lung from prior upper lobe lobectomy. There are multiple stable calcified and noncalcified lung nodules with largest in the middle lobe which is a pleural-based 4.3 x 6.5 mm nodule, essentially unchanged since the prior study when measured in similar fashion. There are several additional, sub-4 mm, calcified and noncalcified nodules throughout bilateral lungs (marked with electronic arrow sign on series 100). No new or suspicious lung nodule. No mass or consolidation. No pleural effusion or pneumothorax. Upper Abdomen: There are multiple diverticula in the visualized portion of transverse splenic flexure of colon without imaging signs of diverticulitis. Multiple areas of focal scarring noted in the visualized portion of left kidney. Remaining visualized upper abdominal viscera within normal limits. Musculoskeletal: The visualized soft tissues of the chest wall are grossly  unremarkable. No suspicious osseous lesions. There are mild to moderate multilevel degenerative changes in the visualized spine. Redemonstration of T3, T8 and T10 vertebroplasties. IMPRESSION: 1. Stable postsurgical changes from prior right upper lobe lobectomy. No evidence of local recurrence or metastatic disease. 2. Multiple stable calcified and noncalcified lung nodules. 3. A 1.7 cm incidental right thyroid nodule. 4. Multiple other nonacute observations, as described above. Aortic Atherosclerosis (ICD10-I70.0). Electronically Signed   By: Jules Schick M.D.   On: 05/02/2023 08:47     ASSESSMENT/PLAN:  This is a very pleasant 72 year old Caucasian female with a history of stage Ia (T1a, N0, M0) non-small cell lung cancer, adenocarcinoma.  She is status post a right upper lobectomy with lymph node dissection under the care of Dr. Cliffton Asters on 06/29/2021.  She has been on observation and feeling fine.  She is found to have a suspicious 6 cm pleural-based nodule which Dr. Arbutus Ped is monitoring closely.  The patient was seen with Dr. Arbutus Ped today.  Dr. Arbutus Ped personally and independently reviewed the scan and discussed the results with the patient today.  The scan showed no evidence of disease progression.  The scan was stable.  Dr. Arbutus Ped recommends that she continue on observation with a repeat CT scan of the chest in 1 year.  He is scheduled to see a neurologist on 9/30 to discuss her cognitive changes since her February hospitalization and her chronic headaches.  The patient was advised to call immediately if she has any concerning symptoms in the interval. The patient voices understanding of current disease status and treatment options and is in agreement with the current care plan. All questions were answered. The patient knows to call the clinic with any problems, questions or concerns. We can certainly see the patient much sooner if necessary   Orders Placed This Encounter   Procedures   CT Chest W Contrast    Standing Status:   Future    Standing Expiration Date:   05/01/2024    Order Specific Question:   If indicated for the ordered procedure, I authorize the administration of contrast media per Radiology protocol    Answer:   Yes    Order Specific Question:   Does the patient have a contrast media/X-ray dye allergy?    Answer:   No    Order Specific Question:   Preferred imaging location?  Answer:   [facility]   CBC with Differential (Cancer Center Only)    Standing Status:   Future    Standing Expiration Date:   05/01/2024   CMP (Cancer Center only)    Standing Status:   Future    Standing Expiration Date:   05/01/2024      Johnette Abraham Leanndra Pember, PA-C 05/02/23  ADDENDUM: Hematology/Oncology Attending: I had a face-to-face encounter with the patient today.  I reviewed her record, lab, scan and recommended her care plan.  This is a very pleasant 72 years old white female with a stage Ia non-small cell lung cancer, adenocarcinoma status post right upper lobectomy with lymph node dissection on June 29, 2021 with tumor size of 0.8 cm.  The patient has been on observation since that time and she is feeling fine with no concerning complaints except for mild fatigue and some neurological issues and dehydration followed by neurology. She had repeat CT scan of the chest performed recently.  I personally and independently reviewed the scan and discussed the result with the patient and her daughter. Her scan showed no concerning findings for disease recurrence or metastasis. I recommended for her to continue on observation with repeat CT scan of the chest in 1 year. The patient was advised to call immediately if she has any other concerning symptoms in the interval. The total time spent in the appointment was 20 minutes. Disclaimer: This note was dictated with voice recognition software. Similar sounding words can inadvertently be transcribed and  may be missed upon review. Lajuana Matte, MD

## 2023-05-02 ENCOUNTER — Inpatient Hospital Stay: Payer: Medicare PPO | Admitting: Physician Assistant

## 2023-05-02 ENCOUNTER — Inpatient Hospital Stay (HOSPITAL_BASED_OUTPATIENT_CLINIC_OR_DEPARTMENT_OTHER): Payer: Medicare PPO | Admitting: Physician Assistant

## 2023-05-02 DIAGNOSIS — Z85118 Personal history of other malignant neoplasm of bronchus and lung: Secondary | ICD-10-CM | POA: Diagnosis not present

## 2023-05-02 DIAGNOSIS — C3411 Malignant neoplasm of upper lobe, right bronchus or lung: Secondary | ICD-10-CM | POA: Diagnosis not present

## 2023-05-02 DIAGNOSIS — Z902 Acquired absence of lung [part of]: Secondary | ICD-10-CM | POA: Diagnosis not present

## 2023-05-02 DIAGNOSIS — C3490 Malignant neoplasm of unspecified part of unspecified bronchus or lung: Secondary | ICD-10-CM

## 2023-05-02 DIAGNOSIS — Z79899 Other long term (current) drug therapy: Secondary | ICD-10-CM | POA: Diagnosis not present

## 2023-05-13 ENCOUNTER — Ambulatory Visit (INDEPENDENT_AMBULATORY_CARE_PROVIDER_SITE_OTHER): Payer: Medicare PPO | Admitting: Neurology

## 2023-05-13 ENCOUNTER — Encounter: Payer: Self-pay | Admitting: Neurology

## 2023-05-13 VITALS — BP 119/71 | HR 75 | Ht 60.0 in | Wt 157.5 lb

## 2023-05-13 DIAGNOSIS — G301 Alzheimer's disease with late onset: Secondary | ICD-10-CM | POA: Diagnosis not present

## 2023-05-13 DIAGNOSIS — E538 Deficiency of other specified B group vitamins: Secondary | ICD-10-CM | POA: Diagnosis not present

## 2023-05-13 DIAGNOSIS — F02A0 Dementia in other diseases classified elsewhere, mild, without behavioral disturbance, psychotic disturbance, mood disturbance, and anxiety: Secondary | ICD-10-CM | POA: Diagnosis not present

## 2023-05-13 DIAGNOSIS — G3184 Mild cognitive impairment, so stated: Secondary | ICD-10-CM

## 2023-05-13 MED ORDER — VITAMIN B-12 1000 MCG PO TABS: 1000.0000 ug | ORAL_TABLET | Freq: Every day | ORAL | 3 refills | Status: DC

## 2023-05-13 NOTE — Patient Instructions (Addendum)
ATN profile to look for Alzheimer disease biomarker Continue your other medications Start B12 supplement  Continue follow-up PCP Return in 1 year or sooner if worse.  There are well-accepted and sensible ways to reduce risk for Alzheimers disease and other degenerative brain disorders .  Exercise Daily Walk A daily 20 minute walk should be part of your routine. Disease related apathy can be a significant roadblock to exercise and the only way to overcome this is to make it a daily routine and perhaps have a reward at the end (something your loved one loves to eat or drink perhaps) or a personal trainer coming to the home can also be very useful. Most importantly, the patient is much more likely to exercise if the caregiver / spouse does it with him/her. In general a structured, repetitive schedule is best.  General Health: Any diseases which effect your body will effect your brain such as a pneumonia, urinary infection, blood clot, heart attack or stroke. Keep contact with your primary care doctor for regular follow ups.  Sleep. A good nights sleep is healthy for the brain. Seven hours is recommended. If you have insomnia or poor sleep habits we can give you some instructions. If you have sleep apnea wear your mask.  Diet: Eating a heart healthy diet is also a good idea; fish and poultry instead of red meat, nuts (mostly non-peanuts), vegetables, fruits, olive oil or canola oil (instead of butter), minimal salt (use other spices to flavor foods), whole grain rice, bread, cereal and pasta and wine in moderation.Research is now showing that the MIND diet, which is a combination of The Mediterranean diet and the DASH diet, is beneficial for cognitive processing and longevity. Information about this diet can be found in The MIND Diet, a book by Alonna Minium, MS, RDN, and online at [employer]  Finances, Power of [address] and Advance Directives: You should consider  putting legal safeguards in place with regard to financial and medical decision making. While the spouse always has power of attorney for medical and financial issues in the absence of any form, you should consider what you want in case the spouse / caregiver is no longer around or capable of making decisions.

## 2023-05-13 NOTE — Progress Notes (Signed)
[facility]  PATIENT: Jenny Cardenas DOB: Nov 18, 1950  REQUESTING CLINICIAN: Georganna Skeans, MD HISTORY FROM: Patient, daughter  REASON FOR VISIT: Memory loss    HISTORICAL  CHIEF COMPLAINT:  Chief Complaint  Patient presents with   New Patient (Initial Visit)    Rm 12. Patient with Daughter and POA, Daughter states pt father had dementia.  internal referral for memory concerns:  MOCA    HISTORY OF PRESENT ILLNESS:  This is a 72 year old woman past medical history of hypothyroidism, hyperlipidemia, history of lung cancer status post right upper lobectomy, anxiety and depression who is presenting with memory loss for the past few years.  Patient lives with her 47 year old mother and takes care of her.  She reports memory problem described mainly as word finding difficulty, she is forgetful, sometimes need reminders and worried about developing dementia like her father.  Daughter tells me that her memory is getting worse, she is forgetful, needing reminders, back in the beginning of the year she was making mistakes with her medication, and in February she was admitted to the hospital for altered mental status.  At that time she was found to have hypokalemia in the setting of taking daily Lasix.  Since discharge from the hospital, it seems like patient is getting better.  She is now compliant with her medications, does have a reminder on her phone, she completes her grocery shopping, still drives, denies any recent accident or getting lost in familiar places but back in January, February she had episode where it was difficult with directions.    TBI:  No past history of TBI Stroke:   no past history of stroke Seizures:   no past history of seizures Sleep:   no history of sleep apnea Mood:  Yes, on Wellbutrin and Zoloft Family history of Dementia: Frontotemporal dementia   Functional status: Patient lives with mother. Cooking: yes Cleaning: yes  Shopping: yes   Bathing: yes no issues  Toileting: no issues  Driving: Driving, got lost but prior to her hospital admission;, no other episode since her recent admission  Bills: no issues   Ever left the stove on by accident?: Denies  Forget how to use items around the house?: Denies Getting lost going to familiar places?: Denies  Forgetting loved ones names?: Denies  Word finding difficulty? Yes  Sleep: Not very well    OTHER MEDICAL CONDITIONS: Hyperlipidemia, Hypothyroidism, History of lung Ca s/p surgery    REVIEW OF SYSTEMS: Full 14 system review of systems performed and negative with exception of: As noted in the HPI   ALLERGIES: Allergies  Allergen Reactions   Ethylene Oxide     Other reaction(s): scarred cornea   Other     Etholine Oxide: causes itching and hives, and temporary blindness    HOME MEDICATIONS: Outpatient Medications Prior to Visit  Medication Sig Dispense Refill   albuterol (PROVENTIL) (2.5 MG/3ML) 0.083% nebulizer solution Take 3 mLs (2.5 mg total) by nebulization every 4 (four) hours as needed for wheezing or shortness of breath. 75 mL 10   albuterol (VENTOLIN HFA) 108 (90 Base) MCG/ACT inhaler Inhale 1-2 puffs into the lungs every 6 (six) hours as needed for wheezing or shortness of breath. This is a one-time refill from me until the patient establish care with her primary care physician.  Please do not send request for refill to me in the future. 8 g 0   ALPRAZolam (XANAX) 0.5 MG tablet Take 1 tablet (0.5 mg total) by mouth  3 (three) times daily as needed for anxiety. 10 tablet 0   buPROPion (WELLBUTRIN XL) 150 MG 24 hr tablet Take 1 tablet (150 mg total) by mouth daily. 90 tablet 1   fluocinonide cream (LIDEX) 0.05 % Apply 1 Application topically 2 (two) times daily.     ibuprofen (ADVIL) 200 MG tablet Take 200 mg by mouth as needed.     Ibuprofen-diphenhydrAMINE Cit (MOTRIN PM PO) Take 1 tablet by mouth at bedtime as needed.     levocetirizine (XYZAL) 5 MG tablet  Take 5 mg by mouth daily as needed for allergies.     levothyroxine (SYNTHROID) 25 MCG tablet Take 1 tablet (25 mcg total) by mouth daily before breakfast. 90 tablet 0   Multiple Vitamins-Minerals (PRESERVISION AREDS 2 PO) Take 1 capsule by mouth in the morning and at bedtime.     pantoprazole (PROTONIX) 40 MG tablet Take 1 tablet (40 mg total) by mouth daily. 90 tablet 1   pravastatin (PRAVACHOL) 10 MG tablet Take 1 tablet (10 mg total) by mouth daily. 90 tablet 1   PRESCRIPTION MEDICATION Natrol memory complex daily     Probiotic Product (PROBIOTIC DAILY PO) Take by mouth daily.     sertraline (ZOLOFT) 100 MG tablet Take 1 tablet (100 mg total) by mouth daily. 90 tablet 1   traMADol (ULTRAM) 50 MG tablet Take 1 tablet (50 mg total) by mouth every 6 (six) hours as needed (mild pain). 30 tablet 0   traZODone (DESYREL) 50 MG tablet Take 1 tablet (50 mg total) by mouth at bedtime. 90 tablet 1   No facility-administered medications prior to visit.    PAST MEDICAL HISTORY: Past Medical History:  Diagnosis Date   ADHD (attention deficit hyperactivity disorder) 2020   ADD   Anxiety    Arthritis    Broken heart syndrome 2013   Carpal tunnel syndrome    Cataract    Chronic kidney disease    Depression    Diabetes mellitus without complication (HCC)    Type 2   Dysrhythmia 1976   tachycardia   Fall 04/10/2015   GERD (gastroesophageal reflux disease)    Goiter    History of shingles    On abdomen, left ring finger, and left leg; Pt takes Valtrex   Hyperlipidemia    Hypertension    Hypothyroidism    lung ca 04/2021   Obesity    PTSD (post-traumatic stress disorder)    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   Shortness of breath dyspnea    pt stated its related to the back problems she has   Thyroid disease    Ulcer     PAST SURGICAL HISTORY: Past Surgical History:  Procedure Laterality Date   ABDOMINAL HYSTERECTOMY  1996   BRONCHIAL BRUSHINGS  06/29/2021   Procedure: BRONCHIAL  BRUSHINGS;  Surgeon: Leslye Peer, MD;  Location: [facility];  Service: Pulmonary;;   BRONCHIAL NEEDLE ASPIRATION BIOPSY  06/29/2021   Procedure: BRONCHIAL NEEDLE ASPIRATION BIOPSIES;  Surgeon: Leslye Peer, MD;  Location: [facility] ENDOSCOPY;  Service: Pulmonary;;   CATARACT EXTRACTION, BILATERAL     CESAREAN SECTION  1976, 1983, 1984   COLONOSCOPY W/ POLYPECTOMY     EYE SURGERY Bilateral 2014   cataratact and lasik surgery   FIDUCIAL MARKER PLACEMENT  06/29/2021   Procedure: FIDUCIAL DYE MARKING;  Surgeon: Leslye Peer, MD;  Location: [facility] ENDOSCOPY;  Service: Pulmonary;;   INTERCOSTAL NERVE BLOCK  06/29/2021   Procedure: INTERCOSTAL NERVE BLOCK;  Surgeon: Corliss Skains, MD;  Location: [facility] OR;  Service: Thoracic;;   KYPHOPLASTY N/A 06/02/2015   Procedure: KYPHOPLASTY;  Surgeon: Estill Bamberg, MD;  Location: [facility] OR;  Service: Orthopedics;  Laterality: N/A;  Thoracic 3, 8, 10 kyphoplasty   LOBECTOMY  06/29/2021   Procedure: RIGHT UPPER LOBECTOMY;  Surgeon: Corliss Skains, MD;  Location: [facility] OR;  Service: Thoracic;;   LYMPH NODE DISSECTION  06/29/2021   Procedure: LYMPH NODE DISSECTION;  Surgeon: Corliss Skains, MD;  Location: [facility] OR;  Service: Thoracic;;    FAMILY HISTORY: Family History  Problem Relation Age of Onset   COPD Mother    Depression Mother    Hypertension Mother    Hyperlipidemia Mother    Breast cancer Mother 12   Diverticulosis Mother    Heart disease Father    Dementia Father    Breast cancer Sister 58   Non-Hodgkin's lymphoma Sister    Colon cancer Neg Hx     SOCIAL HISTORY: Social History   Socioeconomic History   Marital status: Widowed    Spouse name: Not on file   Number of children: 2   Years of education: Not on file   Highest education level: Not on file  Occupational History   Not on file  Tobacco Use   Smoking status: Former    Current packs/day: 0.00    Average packs/day: 0.5 packs/day for 4.0 years (2.0 ttl pk-yrs)     Types: Cigarettes    Start date: 06/28/2012    Quit date: 06/28/2016    Years since quitting: 6.8   Smokeless tobacco: Never  Vaping Use   Vaping status: Never Used  Substance and Sexual Activity   Alcohol use: Not Currently    Comment: rare glass of wine (holidays only)   Drug use: Never   Sexual activity: Not Currently  Other Topics Concern   Not on file  Social History Narrative   Not on file   Social Determinants of Health   Financial Resource Strain: Low Risk  (05/18/2022)   Overall Financial Resource Strain (CARDIA)    Difficulty of Paying Living Expenses: Not hard at all  Food Insecurity: No Food Insecurity (09/22/2022)   Hunger Vital Sign    Worried About Running Out of Food in the Last Year: Never true    Ran Out of Food in the Last Year: Never true  Transportation Needs: No Transportation Needs (09/22/2022)   PRAPARE - Administrator, Civil Service (Medical): No    Lack of Transportation (Non-Medical): No  Physical Activity: Insufficiently Active (05/18/2022)   Exercise Vital Sign    Days of Exercise per Week: 2 days    Minutes of Exercise per Session: 30 min  Stress: Stress Concern Present (05/18/2022)   [employer] of Occupational Health - Occupational Stress Questionnaire    Feeling of Stress : To some extent  Social Connections: Moderately Isolated (05/18/2022)   Social Connection and Isolation Panel [NHANES]    Frequency of Communication with Friends and Family: More than three times a week    Frequency of Social Gatherings with Friends and Family: Once a week    Attends Religious Services: More than 4 times per year    Active Member of [employer] or Organizations: No    Attends Banker Meetings: Never    Marital Status: Widowed  Intimate Partner Violence: Not At Risk (09/22/2022)   Humiliation, Afraid, Rape, and Kick questionnaire    Fear of Current or  Ex-Partner: No    Emotionally Abused: No    Physically Abused: No    Sexually  Abused: No    PHYSICAL EXAM  GENERAL EXAM/CONSTITUTIONAL: Vitals:  Vitals:   05/13/23 1033  BP: 119/71  Pulse: 75  Weight: 157 lb 8 oz (71.4 kg)  Height: 5' (1.524 m)   Body mass index is 30.76 kg/m. Wt Readings from Last 3 Encounters:  05/13/23 157 lb 8 oz (71.4 kg)  02/19/23 159 lb (72.1 kg)  11/15/22 161 lb (73 kg)   Patient is in no distress; well developed, nourished and groomed; neck is supple  MUSCULOSKELETAL: Gait, strength, tone, movements noted in Neurologic exam below  NEUROLOGIC: MENTAL STATUS:     03/29/2022    2:36 PM  MMSE - Mini Mental State Exam  Orientation to time 5  Orientation to Place 5  Registration 3  Attention/ Calculation 5  Recall 3  Language- name 2 objects 2  Language- repeat 1  Language- follow 3 step command 3  Language- read & follow direction 1  Write a sentence 1  Copy design 1  Total score 30      05/13/2023   10:34 AM  Montreal Cognitive Assessment   Visuospatial/ Executive (0/5) 4  Naming (0/3) 3  Attention: Read list of digits (0/2) 2  Attention: Read list of letters (0/1) 1  Attention: Serial 7 subtraction starting at 100 (0/3) 0  Language: Repeat phrase (0/2) 0  Language : Fluency (0/1) 1  Abstraction (0/2) 2  Delayed Recall (0/5) 3  Orientation (0/6) 6  Total 22  Adjusted Score (based on education) 22    CRANIAL NERVE:  2nd, 3rd, 4th, 6th- visual fields full to confrontation, extraocular muscles intact, no nystagmus 5th - facial sensation symmetric 7th - facial strength symmetric 8th - hearing intact 9th - palate elevates symmetrically, uvula midline 11th - shoulder shrug symmetric 12th - tongue protrusion midline  MOTOR:  normal bulk and tone, full strength in the BUE, BLE  SENSORY:  normal and symmetric to light touch  COORDINATION:  finger-nose-finger, fine finger movements normal  GAIT/STATION:  normal   DIAGNOSTIC DATA (LABS, IMAGING, TESTING) - I reviewed patient records, labs, notes,  testing and imaging myself where available.  Lab Results  Component Value Date   WBC 8.4 04/22/2023   HGB 13.0 04/22/2023   HCT 39.2 04/22/2023   MCV 87.7 04/22/2023   PLT 263 04/22/2023      Component Value Date/Time   NA 141 04/22/2023 1311   K 4.5 04/22/2023 1311   CL 107 04/22/2023 1311   CO2 27 04/22/2023 1311   GLUCOSE 108 (H) 04/22/2023 1311   BUN 17 04/22/2023 1311   CREATININE 1.11 (H) 04/22/2023 1311   CREATININE 0.83 10/26/2016 1039   CALCIUM 9.2 04/22/2023 1311   PROT 7.1 04/22/2023 1311   ALBUMIN 4.2 04/22/2023 1311   AST 18 04/22/2023 1311   ALT 12 04/22/2023 1311   ALKPHOS 77 04/22/2023 1311   BILITOT 0.6 04/22/2023 1311   GFRNONAA 52 (L) 04/22/2023 1311   GFRAA >60 07/14/2019 0412   Lab Results  Component Value Date   CHOL 220 (H) 07/21/2012   HDL 27.70 (L) 07/21/2012   LDLCALC 59 04/17/2011   LDLDIRECT 139.6 07/21/2012   TRIG 350.0 (H) 07/21/2012   CHOLHDL 8 07/21/2012   Lab Results  Component Value Date   HGBA1C 6.4 (A) 10/04/2022   Lab Results  Component Value Date   VITAMINB12 264 09/22/2022   Lab  Results  Component Value Date   TSH 1.762 09/22/2022   MRI Brain 09/22/2022 1. No acute intracranial abnormality. 2. Mild chronic microvascular ischemic disease for age    ASSESSMENT AND PLAN  72 y.o. year old female with history of lung cancer status post right upper lobectomy, hypothyroidism, hyperlipidemia, anxiety and depression who is presenting with memory loss described as word finding difficulty, forgetfulness, needing reminders.  Patient does live with her 42 year old mother and she does take care of her.  She is still independent activity of daily living.  On exam today she scored a 22 out of 30 on the MoCA when compared to a year ago when she had a perfect score on the Mini-Mental status exam.  I have informed patient and daughter that she likely has mild cognitive impairment, plan will for now is to obtain ATN profile to look for  Alzheimer disease biomarker.  Her most recent brain MRI did not show any acute abnormality other than mild chronic microvascular disease.  If ATN profile is positive then we will bring the patient back to discuss medication and if ATN is negative then we will just continue to monitor her.  This was discussed with the patient and daughter and they are comfortable with plans.  We also discussed ways to reduce the risk of having dementia including exercise, maintaining good sleep, good health and good diet.  They will they voiced understanding.  I will see them in 1 year for follow-up or sooner if worse.   1. Mild cognitive impairment   2. Vitamin B12 deficiency      Patient Instructions  ATN profile to look for Alzheimer disease biomarker Continue your other medications Start B12 supplement  Continue follow-up PCP Return in 1 year or sooner if worse.  There are well-accepted and sensible ways to reduce risk for Alzheimers disease and other degenerative brain disorders .  Exercise Daily Walk A daily 20 minute walk should be part of your routine. Disease related apathy can be a significant roadblock to exercise and the only way to overcome this is to make it a daily routine and perhaps have a reward at the end (something your loved one loves to eat or drink perhaps) or a personal trainer coming to the home can also be very useful. Most importantly, the patient is much more likely to exercise if the caregiver / spouse does it with him/her. In general a structured, repetitive schedule is best.  General Health: Any diseases which effect your body will effect your brain such as a pneumonia, urinary infection, blood clot, heart attack or stroke. Keep contact with your primary care doctor for regular follow ups.  Sleep. A good nights sleep is healthy for the brain. Seven hours is recommended. If you have insomnia or poor sleep habits we can give you some instructions. If you have sleep apnea wear your  mask.  Diet: Eating a heart healthy diet is also a good idea; fish and poultry instead of red meat, nuts (mostly non-peanuts), vegetables, fruits, olive oil or canola oil (instead of butter), minimal salt (use other spices to flavor foods), whole grain rice, bread, cereal and pasta and wine in moderation.Research is now showing that the MIND diet, which is a combination of The Mediterranean diet and the DASH diet, is beneficial for cognitive processing and longevity. Information about this diet can be found in The MIND Diet, a book by Alonna Minium, MS, RDN, and online at [employer]Wild[employer].es  Finances, Power of [address] and  Advance Directives: You should consider putting legal safeguards in place with regard to financial and medical decision making. While the spouse always has power of attorney for medical and financial issues in the absence of any form, you should consider what you want in case the spouse / caregiver is no longer around or capable of making decisions.    Orders Placed This Encounter  Procedures   ATN PROFILE    Meds ordered this encounter  Medications   cyanocobalamin (VITAMIN B12) 1000 MCG tablet    Sig: Take 1 tablet (1,000 mcg total) by mouth daily.    Dispense:  90 tablet    Refill:  3    Return in about 1 year (around 05/12/2024).  I have spent a total of 60 minutes dedicated to this patient today, preparing to see patient, performing a medically appropriate examination and evaluation, ordering tests and/or medications and procedures, and counseling and educating the patient/family/caregiver; independently interpreting result and communicating results to the family/patient/caregiver; and documenting clinical information in the electronic medical record.   Windell Norfolk, MD 05/13/2023, 1:00 PM  [facility] [address] [place], [place] [address] [phone]

## 2023-05-16 LAB — ATN PROFILE
A -- Beta-amyloid 42/40 Ratio: 0.09 — ABNORMAL LOW (ref >0.102)
Beta-amyloid 40: 229.97 pg/mL
Beta-amyloid 42: 20.72 pg/mL
N -- NfL, Plasma: 5.89 pg/mL (ref 0.00–7.64)
T -- p-tau181: 3.61 pg/mL — ABNORMAL HIGH (ref 0.00–0.97)

## 2023-05-20 MED ORDER — RIVASTIGMINE TARTRATE 1.5 MG PO CAPS: 1.5000 mg | ORAL_CAPSULE | Freq: Two times a day (BID) | ORAL | 11 refills | Status: DC

## 2023-05-20 NOTE — Addendum Note (Signed)
Addended byWindell Norfolk on: 05/20/2023 01:13 PM   Modules accepted: Orders

## 2023-05-23 ENCOUNTER — Ambulatory Visit: Payer: Medicare Other | Admitting: Family Medicine

## 2023-05-23 NOTE — Progress Notes (Signed)
[facility] - Clinic Note  05/29/2023    CHIEF COMPLAINT Patient presents for Retina Follow Up   HISTORY OF PRESENT ILLNESS: Jenny Cardenas is a 72 y.o. female who presents to the clinic today for:   HPI     Retina Follow Up   Patient presents with  Dry AMD.  In both eyes.  This started 9 months ago.  I, the attending physician,  performed the HPI with the patient and updated documentation appropriately.        Comments   Patient here for 9 months retina follow up for non exu ARMD OU. Patient states vision good. Eyes are dry. Sometimes when wakes up they are sore. Like something in eye and scratches. Last February went to ER was dehydrated. Was given potassium caused are to feel like it was burning from inside. At night can't read anything. They get tired.      Last edited by Rennis Chris, MD on 06/01/2023  1:57 AM.     Patient states she has a new pair of glasses. Patient was in the hospital for four days being treated for dehydration.   Referring physician: Georganna Skeans, MD [address] [place],  [place] [address]  HISTORICAL INFORMATION:   Selected notes from the MEDICAL RECORD NUMBER Referred by Dr. Conley Rolls for concern of macular degeneration OU   CURRENT MEDICATIONS: No current outpatient medications on file. (Ophthalmic Drugs)   No current facility-administered medications for this visit. (Ophthalmic Drugs)   Current Outpatient Medications (Other)  Medication Sig   albuterol (PROVENTIL) (2.5 MG/3ML) 0.083% nebulizer solution Take 3 mLs (2.5 mg total) by nebulization every 4 (four) hours as needed for wheezing or shortness of breath.   albuterol (VENTOLIN HFA) 108 (90 Base) MCG/ACT inhaler Inhale 1-2 puffs into the lungs every 6 (six) hours as needed for wheezing or shortness of breath. This is a one-time refill from me until the patient establish care with her primary care physician.  Please do not send request for refill to me in the  future.   ALPRAZolam (XANAX) 0.5 MG tablet Take 1 tablet (0.5 mg total) by mouth 3 (three) times daily as needed for anxiety.   buPROPion (WELLBUTRIN XL) 150 MG 24 hr tablet Take 1 tablet (150 mg total) by mouth daily.   cyanocobalamin (VITAMIN B12) 1000 MCG tablet Take 1 tablet (1,000 mcg total) by mouth daily.   fluocinonide cream (LIDEX) 0.05 % Apply 1 Application topically 2 (two) times daily.   ibuprofen (ADVIL) 200 MG tablet Take 200 mg by mouth as needed.   Ibuprofen-diphenhydrAMINE Cit (MOTRIN PM PO) Take 1 tablet by mouth at bedtime as needed.   levocetirizine (XYZAL) 5 MG tablet Take 5 mg by mouth daily as needed for allergies.   levothyroxine (SYNTHROID) 25 MCG tablet Take 1 tablet (25 mcg total) by mouth daily before breakfast.   Multiple Vitamins-Minerals (PRESERVISION AREDS 2 PO) Take 1 capsule by mouth in the morning and at bedtime.   pantoprazole (PROTONIX) 40 MG tablet Take 1 tablet (40 mg total) by mouth daily.   pravastatin (PRAVACHOL) 10 MG tablet Take 1 tablet (10 mg total) by mouth daily.   PRESCRIPTION MEDICATION Natrol memory complex daily   Probiotic Product (PROBIOTIC DAILY PO) Take by mouth daily.   rivastigmine (EXELON) 1.5 MG capsule Take 1 capsule (1.5 mg total) by mouth 2 (two) times daily.   sertraline (ZOLOFT) 100 MG tablet Take 1 tablet (100 mg total) by mouth  daily.   traMADol (ULTRAM) 50 MG tablet Take 1 tablet (50 mg total) by mouth every 6 (six) hours as needed (mild pain).   traZODone (DESYREL) 50 MG tablet Take 1 tablet (50 mg total) by mouth at bedtime.   No current facility-administered medications for this visit. (Other)   REVIEW OF SYSTEMS: ROS   Positive for: Gastrointestinal, Musculoskeletal, Endocrine, Eyes, Psychiatric Negative for: Constitutional, Neurological, Skin, Genitourinary, HENT, Cardiovascular, Respiratory, Allergic/Imm, Heme/Lymph Last edited by Laddie Aquas, COA on 05/29/2023 12:59 PM.     ALLERGIES Allergies  Allergen  Reactions   Ethylene Oxide     Other reaction(s): scarred cornea   Other     Etholine Oxide: causes itching and hives, and temporary blindness   PAST MEDICAL HISTORY Past Medical History:  Diagnosis Date   ADHD (attention deficit hyperactivity disorder) 2020   ADD   Anxiety    Arthritis    Broken heart syndrome 2013   Carpal tunnel syndrome    Cataract    Chronic kidney disease    Depression    Diabetes mellitus without complication (HCC)    Type 2   Dysrhythmia 1976   tachycardia   Fall 04/10/2015   GERD (gastroesophageal reflux disease)    Goiter    History of shingles    On abdomen, left ring finger, and left leg; Pt takes Valtrex   Hyperlipidemia    Hypertension    Hypothyroidism    lung ca 04/2021   Obesity    PTSD (post-traumatic stress disorder)    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   Shortness of breath dyspnea    pt stated its related to the back problems she has   Thyroid disease    Ulcer    Past Surgical History:  Procedure Laterality Date   ABDOMINAL HYSTERECTOMY  1996   BRONCHIAL BRUSHINGS  06/29/2021   Procedure: BRONCHIAL BRUSHINGS;  Surgeon: Leslye Peer, MD;  Location: [facility] ENDOSCOPY;  Service: Pulmonary;;   BRONCHIAL NEEDLE ASPIRATION BIOPSY  06/29/2021   Procedure: BRONCHIAL NEEDLE ASPIRATION BIOPSIES;  Surgeon: Leslye Peer, MD;  Location: [facility] ENDOSCOPY;  Service: Pulmonary;;   CATARACT EXTRACTION, BILATERAL     CESAREAN SECTION  1976, 1983, 1984   COLONOSCOPY W/ POLYPECTOMY     EYE SURGERY Bilateral 2014   cataratact and lasik surgery   FIDUCIAL MARKER PLACEMENT  06/29/2021   Procedure: FIDUCIAL DYE MARKING;  Surgeon: Leslye Peer, MD;  Location: [facility] ENDOSCOPY;  Service: Pulmonary;;   INTERCOSTAL NERVE BLOCK  06/29/2021   Procedure: INTERCOSTAL NERVE BLOCK;  Surgeon: Corliss Skains, MD;  Location: [facility] OR;  Service: Thoracic;;   KYPHOPLASTY N/A 06/02/2015   Procedure: KYPHOPLASTY;  Surgeon: Estill Bamberg, MD;  Location: [facility] OR;   Service: Orthopedics;  Laterality: N/A;  Thoracic 3, 8, 10 kyphoplasty   LOBECTOMY  06/29/2021   Procedure: RIGHT UPPER LOBECTOMY;  Surgeon: Corliss Skains, MD;  Location: [facility] OR;  Service: Thoracic;;   LYMPH NODE DISSECTION  06/29/2021   Procedure: LYMPH NODE DISSECTION;  Surgeon: Corliss Skains, MD;  Location: [facility] OR;  Service: Thoracic;;   FAMILY HISTORY Family History  Problem Relation Age of Onset   COPD Mother    Depression Mother    Hypertension Mother    Hyperlipidemia Mother    Breast cancer Mother 33   Diverticulosis Mother    Heart disease Father    Dementia Father    Breast cancer Sister 9   Non-Hodgkin's lymphoma Sister  Colon cancer Neg Hx    SOCIAL HISTORY Social History   Tobacco Use   Smoking status: Former    Current packs/day: 0.00    Average packs/day: 0.5 packs/day for 4.0 years (2.0 ttl pk-yrs)    Types: Cigarettes    Start date: 06/28/2012    Quit date: 06/28/2016    Years since quitting: 6.9   Smokeless tobacco: Never  Vaping Use   Vaping status: Never Used  Substance Use Topics   Alcohol use: Not Currently    Comment: rare glass of wine (holidays only)   Drug use: Never       OPHTHALMIC EXAM: Base Eye Exam     Visual Acuity (Snellen - Linear)       Right Left   Dist cc 20/30 -2 20/40   Dist ph cc 20/25 -2 20/25 -2    Correction: Glasses         Tonometry (Tonopen, 12:55 PM)       Right Left   Pressure 15 16         Pupils       Dark Light Shape React APD   Right 3 2 Round Brisk None   Left 3 2 Round Brisk None         Visual Fields (Counting fingers)       Left Right    Full Full         Extraocular Movement       Right Left    Full, Ortho Full, Ortho         Neuro/Psych     Oriented x3: Yes   Mood/Affect: Normal         Dilation     Both eyes: 1.0% Mydriacyl, 2.5% Phenylephrine @ 12:55 PM           Slit Lamp and Fundus Exam     Slit Lamp Exam       Right Left    Lids/Lashes Mild Dermatochalasis - upper lid Mild Dermatochalasis - upper lid, Meibomian gland dysfunction   Conjunctiva/Sclera White and quiet prominent epi scleral vessels temporally   Cornea Mild arcus, trace Punctate epithelial erosions, well healed cataract wounds, superior / inferior LRIs, barely visible lasik flap, trace tear film debris arcus, trace Punctate epithelial erosions, well healed cataract wounds, superior / inferior LRIs, well healed lasik flap, mild corneal haze / Salzmann's nodule at 0400, trace tear film debris   Anterior Chamber deep and clear deep and clear   Iris Round and dilated Round and dilated   Lens PC IOL in good position with open PC PC IOL in good position with open PC   Anterior Vitreous Vitreous syneresis, Posterior vitreous detachment, mild vitreous condensations Vitreous syneresis, Posterior vitreous detachment, vitreous condensations         Fundus Exam       Right Left   Disc mild Pallor, Sharp rim, Compact trace Pallor, Sharp rim   C/D Ratio 0.2 0.3   Macula Flat, Good foveal reflex, rare Drusen, mild RPE mottling, No heme or edema Flat, Good foveal reflex, +Drusen, mild RPE mottling, No heme or edema   Vessels mild attenuation mild attenuation   Periphery Attached, mild reticular degeneration, No heme Attached, mild reticular degeneration, No heme           Refraction     Wearing Rx       Sphere Cylinder Axis Add   Right +0.50 +0.75 085 +3.75   Left [place] +0.75 085 +3.75  IMAGING AND PROCEDURES  Imaging and Procedures for 05/29/2023  OCT, Retina - OU - Both Eyes       Right Eye Quality was good. Central Foveal Thickness: 284. Progression has been stable. Findings include normal foveal contour, no IRF, no SRF, retinal drusen (Rare drusen nasal macula / peripapillary).   Left Eye Quality was good. Central Foveal Thickness: 286. Progression has been stable. Findings include normal foveal contour, no IRF, no SRF, retinal  drusen (Rare, focal drusen-- prominent druse IN mac).   Notes *Images captured and stored on drive  Diagnosis / Impression:  NFP, no IRF/SRF OU rare focal drusen / early non-exu ARMD OU  Clinical management:  See below  Abbreviations: NFP - Normal foveal profile. CME - cystoid macular edema. PED - pigment epithelial detachment. IRF - intraretinal fluid. SRF - subretinal fluid. EZ - ellipsoid zone. ERM - epiretinal membrane. ORA - outer retinal atrophy. ORT - outer retinal tubulation. SRHM - subretinal hyper-reflective material. IRHM - intraretinal hyper-reflective material            ASSESSMENT/PLAN:   ICD-10-CM   1. Early dry stage nonexudative age-related macular degeneration of both eyes  H35.3131 OCT, Retina - OU - Both Eyes    2. Diabetes mellitus type 2 without retinopathy (HCC)  E11.9     3. Pseudophakia, both eyes  Z96.1     4. S/P LASIK (laser assisted in situ keratomileusis) of both eyes  Z98.890     5. Dry eyes  H04.123       1. Age related macular degeneration, non-exudative, OU  - exam and OCT with rare focal drusen OU -- early stage, stable - The incidence, anatomy, and pathology of dry AMD, risk of progression, and the AREDS and AREDS 2 studies including smoking risks discussed with patient.  - Continue amsler grid monitoring  - f/u 9 months, sooner prn -- DFE, OCT  2. Diabetes mellitus, type 2 without retinopathy - The incidence, risk factors for progression, natural history and treatment options for diabetic retinopathy  were discussed with patient.   - The need for close monitoring of blood glucose, blood pressure, and serum lipids, avoiding cigarette or any type of tobacco, and the need for long term follow up was also discussed with patient. - f/u in 1 year, sooner prn  3. Pseudophakia OU  - s/p CE/IOL OU (Dr. Delaney Meigs)  - IOLs in good position, doing well  - monitor  4. History of Lasik  - stable  - mild corneal scarring OS  5. Dry eyes  OU  - recommend artificial tears and lubricating ointment as needed  Ophthalmic Meds Ordered this visit:  No orders of the defined types were placed in this encounter.    Return in about 9 months (around 02/26/2024) for f/u Non Ex. AMD OU , DFE, OCT.  There are no Patient Instructions on file for this visit.  Explained the diagnoses, plan, and follow up with the patient and they expressed understanding.  Patient expressed understanding of the importance of proper follow up care.   This document serves as a record of services personally performed by Karie Chimera, MD, PhD. It was created on their behalf by De Blanch, an ophthalmic technician. The creation of this record is the provider's dictation and/or activities during the visit.    Electronically signed by: De Blanch, OA, 06/01/23  1:59 AM  This document serves as a record of services personally performed by Karie Chimera, MD, PhD. It was  created on their behalf by Charlette Caffey, COT an ophthalmic technician. The creation of this record is the provider's dictation and/or activities during the visit.    Electronically signed by:  Charlette Caffey, COT  06/01/23 1:59 AM  Karie Chimera, M.D., Ph.D. Diseases & Surgery of the Retina and Vitreous [facility]  I have reviewed the above documentation for accuracy and completeness, and I agree with the above. Karie Chimera, M.D., Ph.D. 06/01/23 2:02 AM   Abbreviations: M myopia (nearsighted); A astigmatism; H hyperopia (farsighted); P presbyopia; Mrx spectacle prescription;  CTL contact lenses; OD right eye; OS left eye; OU both eyes  XT exotropia; ET esotropia; PEK punctate epithelial keratitis; PEE punctate epithelial erosions; DES dry eye syndrome; MGD meibomian gland dysfunction; ATs artificial tears; PFAT's preservative free artificial tears; NSC nuclear sclerotic cataract; PSC posterior subcapsular cataract; ERM epi-retinal membrane;  PVD posterior vitreous detachment; RD retinal detachment; DM diabetes mellitus; DR diabetic retinopathy; NPDR non-proliferative diabetic retinopathy; PDR proliferative diabetic retinopathy; CSME clinically significant macular edema; DME diabetic macular edema; dbh dot blot hemorrhages; CWS cotton wool spot; POAG primary open angle glaucoma; C/D cup-to-disc ratio; HVF humphrey visual field; GVF goldmann visual field; OCT optical coherence tomography; IOP intraocular pressure; BRVO Branch retinal vein occlusion; CRVO central retinal vein occlusion; CRAO central retinal artery occlusion; BRAO branch retinal artery occlusion; RT retinal tear; SB scleral buckle; PPV pars plana vitrectomy; VH Vitreous hemorrhage; PRP panretinal laser photocoagulation; IVK intravitreal kenalog; VMT vitreomacular traction; MH Macular hole;  NVD neovascularization of the disc; NVE neovascularization elsewhere; AREDS age related eye disease study; ARMD age related macular degeneration; POAG primary open angle glaucoma; EBMD epithelial/anterior basement membrane dystrophy; ACIOL anterior chamber intraocular lens; IOL intraocular lens; PCIOL posterior chamber intraocular lens; Phaco/IOL phacoemulsification with intraocular lens placement; PRK photorefractive keratectomy; LASIK laser assisted in situ keratomileusis; HTN hypertension; DM diabetes mellitus; COPD chronic obstructive pulmonary disease

## 2023-05-27 ENCOUNTER — Telehealth: Payer: Self-pay | Admitting: Family Medicine

## 2023-05-27 NOTE — Telephone Encounter (Signed)
Patient called stated she was told by another provider she may need to up the dosage to 2 day. One at 5pm and another at bedtime. Please f/u with patient

## 2023-05-28 NOTE — Telephone Encounter (Signed)
The medication pt is speaking of is Traezodone 50mg . Please advise.

## 2023-05-28 NOTE — Telephone Encounter (Signed)
Not clear as to what medication patient is speaking of

## 2023-05-29 ENCOUNTER — Ambulatory Visit (INDEPENDENT_AMBULATORY_CARE_PROVIDER_SITE_OTHER): Payer: Medicare HMO | Admitting: Ophthalmology

## 2023-05-29 ENCOUNTER — Encounter (INDEPENDENT_AMBULATORY_CARE_PROVIDER_SITE_OTHER): Payer: Self-pay | Admitting: Ophthalmology

## 2023-05-29 DIAGNOSIS — Z961 Presence of intraocular lens: Secondary | ICD-10-CM

## 2023-05-29 DIAGNOSIS — H04123 Dry eye syndrome of bilateral lacrimal glands: Secondary | ICD-10-CM

## 2023-05-29 DIAGNOSIS — E119 Type 2 diabetes mellitus without complications: Secondary | ICD-10-CM

## 2023-05-29 DIAGNOSIS — H353131 Nonexudative age-related macular degeneration, bilateral, early dry stage: Secondary | ICD-10-CM

## 2023-05-29 DIAGNOSIS — Z9889 Other specified postprocedural states: Secondary | ICD-10-CM | POA: Diagnosis not present

## 2023-05-29 NOTE — Telephone Encounter (Signed)
Called patient no answer left voicemail to call office back if she needs sooner appointment for medication adjustment or we can see patient on scheduled day and make video visit.

## 2023-05-29 NOTE — Telephone Encounter (Signed)
Schedule video visit please for medication adjustment

## 2023-06-01 ENCOUNTER — Encounter (INDEPENDENT_AMBULATORY_CARE_PROVIDER_SITE_OTHER): Payer: Self-pay | Admitting: Ophthalmology

## 2023-06-04 ENCOUNTER — Ambulatory Visit (INDEPENDENT_AMBULATORY_CARE_PROVIDER_SITE_OTHER): Payer: Medicare HMO

## 2023-06-04 ENCOUNTER — Ambulatory Visit: Payer: Self-pay | Admitting: *Deleted

## 2023-06-04 ENCOUNTER — Telehealth: Payer: Self-pay | Admitting: Family Medicine

## 2023-06-04 DIAGNOSIS — Z0000 Encounter for general adult medical examination without abnormal findings: Secondary | ICD-10-CM | POA: Diagnosis not present

## 2023-06-04 NOTE — Patient Instructions (Addendum)
Jenny Cardenas , Thank you for taking time to come for your Medicare Wellness Visit. I appreciate your ongoing commitment to your health goals. Please review the following plan we discussed and let me know if I can assist you in the future.   Referrals/Orders/Follow-Ups/Clinician Recommendations: none  This is a list of the screening recommended for you and due dates:  Health Maintenance  Topic Date Due   Hepatitis C Screening  Never done   Flu Shot  03/14/2023   Hemoglobin A1C  04/04/2023   COVID-19 Vaccine (5 - 2023-24 season) 04/14/2023   Mammogram  08/11/2023*   Complete foot exam   08/12/2023*   Eye exam for diabetics  08/13/2023*   Colon Cancer Screening  08/13/2023*   Yearly kidney health urinalysis for diabetes  02/19/2024   Yearly kidney function blood test for diabetes  04/21/2024   Medicare Annual Wellness Visit  06/03/2024   Pneumonia Vaccine  Completed   DEXA scan (bone density measurement)  Completed   HPV Vaccine  Aged Out   DTaP/Tdap/Td vaccine  Discontinued   Zoster (Shingles) Vaccine  Discontinued  *Topic was postponed. The date shown is not the original due date.    Advanced directives: (Copy Requested) Please bring a copy of your health care power of attorney and living will to the office to be added to your chart at your convenience.  Next Medicare Annual Wellness Visit scheduled for next year: No, schedule is not open at this time  Insert Preventive Care attachment Insert FALL PREVENTION attachment if needed

## 2023-06-04 NOTE — Telephone Encounter (Signed)
  Chief Complaint: medication dose concern- Jenny Cardenas was advised per neurologist- Jenny Cardenas to stop OTC Motrin to protect kidney function Symptoms: Jenny Cardenas states she was advised to take 2 trazodone for sleep- Jenny Cardenas does spread dosing out 5/10pm and states she is able to stay asleep throughout the night.  Frequency: Jenny Cardenas needs provider review on medication- will need early refill with #/instruction changes  Disposition: [] ED /[] Urgent Care (no appt availability in office) / [] Appointment(In office/virtual)/ []  [facility]/ [] Home Care/ [] Refused Recommended Disposition /[] [facility]/ [x]  Follow-up with PCP Additional Notes: Spoke with Jenny Cardenas- advised her provider would like to discuss this with her- Jenny Cardenas has upcoming appointment 10/29 and reports she should be fine with the medication she has on hand. She will in the office to discuss the recommended change advised and her response.

## 2023-06-04 NOTE — Telephone Encounter (Signed)
Summary: Pt requests to increase Rx dosage   Pt stated that her neurologist told her that she should not be taking otc medications with prescription medication so she should have the Rx for traZODone (DESYREL) 50 MG tablet increased to 2 pills instead of one. Pt stated it helps her to be able to sleep. Pt requests call back. Cb# [phone]       Reason for Disposition . [1] Caller has NON-URGENT medicine question about med that PCP prescribed AND [2] triager unable to answer question  Answer Assessment - Initial Assessment Questions 1. NAME of MEDICINE: "What medicine(s) are you calling about?"     Patient was seen at neurology recently- she states she takes OTC Motrin at night- she was advised to stop Motrin at night to protect her kidney 2. QUESTION: "What is your question?" (e.g., double dose of medicine, side effect)     Patient wants to know if she can take her trazodone- 2/night. Patient was advised to take trazodone- 5pm and 9-10pm- sleeping much better  3. PRESCRIBER: "Who prescribed the medicine?" Reason: if prescribed by specialist, call should be referred to that group.     PCP  Protocols used: Medication Question Call-A-AH

## 2023-06-04 NOTE — Progress Notes (Signed)
Subjective:   Jenny Cardenas is a 72 y.o. female who presents for Medicare Annual (Subsequent) preventive examination.  Visit Complete: Virtual I connected with  Lorretta Harp on 06/04/23 by a audio enabled telemedicine application and verified that I am speaking with the correct person using two identifiers.  Patient Location: Home  Provider Location: Office/Clinic  I discussed the limitations of evaluation and management by telemedicine. The patient expressed understanding and agreed to proceed.  Vital Signs: Because this visit was a virtual/telehealth visit, some criteria may be missing or patient reported. Any vitals not documented were not able to be obtained and vitals that have been documented are patient reported.    Cardiac Risk Factors include: advanced age (>72men, >36 women);diabetes mellitus;dyslipidemia;hypertension     Objective:    Today's Vitals   There is no height or weight on file to calculate BMI.     06/04/2023    2:15 PM 03/29/2022    1:28 PM 09/19/2021   11:59 AM 07/02/2021    6:00 AM 06/27/2021    1:33 PM 07/13/2019    4:13 AM 07/12/2019    7:21 PM  Advanced Directives  Does Patient Have a Medical Advance Directive? Yes Yes Yes Yes Yes Yes No  Type of Estate agent of [employer] Power of [place];Living will Living will;Healthcare Power of [employer] Power of [place];Living will Healthcare Power of [place];Living will Living will;Healthcare Power of Attorney   Does patient want to make changes to medical advance directive?  No - Patient declined  No - Patient declined No - Patient declined No - Patient declined   Copy of Healthcare Power of Attorney in Chart? No - copy requested    No - copy requested No - copy requested   Would patient like information on creating a medical advance directive?      No - Patient declined No - Patient declined    Current Medications (verified) Outpatient Encounter Medications as  of 06/04/2023  Medication Sig   albuterol (PROVENTIL) (2.5 MG/3ML) 0.083% nebulizer solution Take 3 mLs (2.5 mg total) by nebulization every 4 (four) hours as needed for wheezing or shortness of breath.   albuterol (VENTOLIN HFA) 108 (90 Base) MCG/ACT inhaler Inhale 1-2 puffs into the lungs every 6 (six) hours as needed for wheezing or shortness of breath. This is a one-time refill from me until the patient establish care with her primary care physician.  Please do not send request for refill to me in the future.   buPROPion (WELLBUTRIN XL) 150 MG 24 hr tablet Take 1 tablet (150 mg total) by mouth daily.   cyanocobalamin (VITAMIN B12) 1000 MCG tablet Take 1 tablet (1,000 mcg total) by mouth daily.   fluocinonide cream (LIDEX) 0.05 % Apply 1 Application topically 2 (two) times daily.   Ibuprofen-diphenhydrAMINE Cit (MOTRIN PM PO) Take 1 tablet by mouth at bedtime as needed.   levothyroxine (SYNTHROID) 25 MCG tablet Take 1 tablet (25 mcg total) by mouth daily before breakfast.   Multiple Vitamins-Minerals (PRESERVISION AREDS 2 PO) Take 1 capsule by mouth in the morning and at bedtime.   pravastatin (PRAVACHOL) 10 MG tablet Take 1 tablet (10 mg total) by mouth daily.   Probiotic Product (PROBIOTIC DAILY PO) Take by mouth daily.   rivastigmine (EXELON) 1.5 MG capsule Take 1 capsule (1.5 mg total) by mouth 2 (two) times daily.   sertraline (ZOLOFT) 100 MG tablet Take 1 tablet (100 mg total) by mouth daily.  traZODone (DESYREL) 50 MG tablet Take 1 tablet (50 mg total) by mouth at bedtime.   ALPRAZolam (XANAX) 0.5 MG tablet Take 1 tablet (0.5 mg total) by mouth 3 (three) times daily as needed for anxiety. (Patient not taking: Reported on 06/04/2023)   ibuprofen (ADVIL) 200 MG tablet Take 200 mg by mouth as needed. (Patient not taking: Reported on 06/04/2023)   levocetirizine (XYZAL) 5 MG tablet Take 5 mg by mouth daily as needed for allergies.   pantoprazole (PROTONIX) 40 MG tablet Take 1 tablet (40 mg  total) by mouth daily. (Patient not taking: Reported on 06/04/2023)   PRESCRIPTION MEDICATION Natrol memory complex daily (Patient not taking: Reported on 06/04/2023)   traMADol (ULTRAM) 50 MG tablet Take 1 tablet (50 mg total) by mouth every 6 (six) hours as needed (mild pain). (Patient not taking: Reported on 06/04/2023)   No facility-administered encounter medications on file as of 06/04/2023.    Allergies (verified) Ethylene oxide and Other   History: Past Medical History:  Diagnosis Date   ADHD (attention deficit hyperactivity disorder) 2020   ADD   Anxiety    Arthritis    Broken heart syndrome 2013   Carpal tunnel syndrome    Cataract    Chronic kidney disease    Depression    Diabetes mellitus without complication (HCC)    Type 2   Dysrhythmia 1976   tachycardia   Fall 04/10/2015   GERD (gastroesophageal reflux disease)    Goiter    History of shingles    On abdomen, left ring finger, and left leg; Pt takes Valtrex   Hyperlipidemia    Hypertension    Hypothyroidism    lung ca 04/2021   Obesity    PTSD (post-traumatic stress disorder)    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   Shortness of breath dyspnea    pt stated its related to the back problems she has   Thyroid disease    Ulcer    Past Surgical History:  Procedure Laterality Date   ABDOMINAL HYSTERECTOMY  1996   BRONCHIAL BRUSHINGS  06/29/2021   Procedure: BRONCHIAL BRUSHINGS;  Surgeon: Leslye Peer, MD;  Location[facility] ENDOSCOPY;  Service: Pulmonary;;   BRONCHIAL NEEDLE ASPIRATION BIOPSY  06/29/2021   Procedure: BRONCHIAL NEEDLE ASPIRATION BIOPSIES;  Surgeon: Leslye Peer, MD;  Location: [facility] ENDOSCOPY;  Service: Pulmonary;;   CATARACT EXTRACTION, BILATERAL     CESAREAN SECTION  1976, 1983, 1984   COLONOSCOPY W/ POLYPECTOMY     EYE SURGERY Bilateral 2014   cataratact and lasik surgery   FIDUCIAL MARKER PLACEMENT  06/29/2021   Procedure: FIDUCIAL DYE MARKING;  Surgeon: Leslye Peer, MD;  Location:  [facility] ENDOSCOPY;  Service: Pulmonary;;   INTERCOSTAL NERVE BLOCK  06/29/2021   Procedure: INTERCOSTAL NERVE BLOCK;  Surgeon: Corliss Skains, MD;  Location: [facility] OR;  Service: Thoracic;;   KYPHOPLASTY N/A 06/02/2015   Procedure: KYPHOPLASTY;  Surgeon: Estill Bamberg, MD;  Location: [facility] OR;  Service: Orthopedics;  Laterality: N/A;  Thoracic 3, 8, 10 kyphoplasty   LOBECTOMY  06/29/2021   Procedure: RIGHT UPPER LOBECTOMY;  Surgeon: Corliss Skains, MD;  Location: [facility] OR;  Service: Thoracic;;   LYMPH NODE DISSECTION  06/29/2021   Procedure: LYMPH NODE DISSECTION;  Surgeon: Corliss Skains, MD;  Location: [facility] OR;  Service: Thoracic;;   Family History  Problem Relation Age of Onset   COPD Mother    Depression Mother    Hypertension Mother  Hyperlipidemia Mother    Breast cancer Mother 43   Diverticulosis Mother    Heart disease Father    Dementia Father    Breast cancer Sister 49   Non-Hodgkin's lymphoma Sister    Colon cancer Neg Hx    Social History   Socioeconomic History   Marital status: Widowed    Spouse name: Not on file   Number of children: 2   Years of education: Not on file   Highest education level: Not on file  Occupational History   Not on file  Tobacco Use   Smoking status: Former    Current packs/day: 0.00    Average packs/day: 0.5 packs/day for 4.0 years (2.0 ttl pk-yrs)    Types: Cigarettes    Start date: 06/28/2012    Quit date: 06/28/2016    Years since quitting: 6.9   Smokeless tobacco: Never  Vaping Use   Vaping status: Never Used  Substance and Sexual Activity   Alcohol use: Not Currently    Comment: rare glass of wine (holidays only)   Drug use: Never   Sexual activity: Not Currently  Other Topics Concern   Not on file  Social History Narrative   Not on file   Social Determinants of Health   Financial Resource Strain: Low Risk  (06/04/2023)   Overall Financial Resource Strain (CARDIA)    Difficulty of Paying Living Expenses: Not  hard at all  Food Insecurity: No Food Insecurity (06/04/2023)   Hunger Vital Sign    Worried About Running Out of Food in the Last Year: Never true    Ran Out of Food in the Last Year: Never true  Transportation Needs: No Transportation Needs (06/04/2023)   PRAPARE - Administrator, Civil Service (Medical): No    Lack of Transportation (Non-Medical): No  Physical Activity: Inactive (06/04/2023)   Exercise Vital Sign    Days of Exercise per Week: 0 days    Minutes of Exercise per Session: 0 min  Stress: Stress Concern Present (06/04/2023)   [employer] of Occupational Health - Occupational Stress Questionnaire    Feeling of Stress : To some extent  Social Connections: Moderately Isolated (06/04/2023)   Social Connection and Isolation Panel [NHANES]    Frequency of Communication with Friends and Family: More than three times a week    Frequency of Social Gatherings with Friends and Family: More than three times a week    Attends Religious Services: More than 4 times per year    Active Member of [employer] or Organizations: No    Attends Banker Meetings: Never    Marital Status: Widowed    Tobacco Counseling Counseling given: Not Answered   Clinical Intake:  Pre-visit preparation completed: Yes  Pain : No/denies pain     Nutritional Risks: None Diabetes: Yes CBG done?: No Did pt. bring in CBG monitor from home?: No  How often do you need to have someone help you when you read instructions, pamphlets, or other written materials from your doctor or pharmacy?: 1 - Never  Interpreter Needed?: No  Information entered by :: NAllen LPN   Activities of Daily Living    06/04/2023    2:03 PM 09/24/2022    4:36 PM  In your present state of health, do you have any difficulty performing the following activities:  Hearing? 0 0  Vision? 0 0  Difficulty concentrating or making decisions? 1 1  Walking or climbing stairs? 0   Dressing or  bathing? 0 0   Doing errands, shopping? 0 1  Preparing Food and eating ? N   Using the Toilet? N   In the past six months, have you accidently leaked urine? N   Do you have problems with loss of bowel control? N   Managing your Medications? N   Managing your Finances? N   Housekeeping or managing your Housekeeping? N     Patient Care Team: Georganna Skeans, MD as PCP - General (Family Medicine) Syliva Overman, RN as Oncology Nurse Navigator (Oncology) Estill Bamberg, MD as Consulting Physician (Orthopedic Surgery)  Indicate any recent Medical Services you may have received from other than [facility] providers in the past year (date may be approximate).     Assessment:   This is a routine wellness examination for [place].  Hearing/Vision screen Hearing Screening - Comments:: Denies hearing issues Vision Screening - Comments:: Regular eye exams, [facility]   Goals Addressed             This Visit's Progress    Patient Stated       06/04/2023, drink more water       Depression Screen    06/04/2023    2:17 PM 02/19/2023    2:19 PM 10/03/2022    1:39 PM 05/18/2022   12:01 PM 04/23/2022    9:37 AM  PHQ 2/9 Scores  PHQ - 2 Score 3 0 5 4 1   PHQ- 9 Score 4 0 11 14 5     Fall Risk    05/18/2022   12:05 PM 03/29/2022    1:27 PM 10/26/2016    9:21 AM  Fall Risk   Falls in the past year? 1 0 Yes  Number falls in past yr: 0 0 2 or more  Injury with Fall? 0 0 Yes  Risk Factor Category    High Fall Risk  Risk for fall due to : History of fall(s) History of fall(s)   Follow up Falls evaluation completed  Falls evaluation completed    MEDICARE RISK AT HOME: Medicare Risk at Home Any stairs in or around the home?: Yes (ramp) If so, are there any without handrails?: No Home free of loose throw rugs in walkways, pet beds, electrical cords, etc?: Yes Adequate lighting in your home to reduce risk of falls?: Yes Life alert?: No Use of a cane, walker or w/c?: No Grab bars in the bathroom?:  Yes Shower chair or bench in shower?: Yes Elevated toilet seat or a handicapped toilet?: Yes  TIMED UP AND GO:  Was the test performed?  No    Cognitive Function:    03/29/2022    2:36 PM  MMSE - Mini Mental State Exam  Orientation to time 5  Orientation to Place 5  Registration 3  Attention/ Calculation 5  Recall 3  Language- name 2 objects 2  Language- repeat 1  Language- follow 3 step command 3  Language- read & follow direction 1  Write a sentence 1  Copy design 1  Total score 30      05/13/2023   10:34 AM  Montreal Cognitive Assessment   Visuospatial/ Executive (0/5) 4  Naming (0/3) 3  Attention: Read list of digits (0/2) 2  Attention: Read list of letters (0/1) 1  Attention: Serial 7 subtraction starting at 100 (0/3) 0  Language: Repeat phrase (0/2) 0  Language : Fluency (0/1) 1  Abstraction (0/2) 2  Delayed Recall (0/5) 3  Orientation (0/6) 6  Total 22  Adjusted Score (based on education) 22      06/04/2023    2:19 PM 05/18/2022   12:07 PM  6CIT Screen  What Year? 0 points 0 points  What month? 0 points   What time? 0 points 3 points  Count back from 20 0 points 0 points  Months in reverse 4 points 2 points  Repeat phrase 4 points 10 points  Total Score 8 points     Immunizations Immunization History  Administered Date(s) Administered   Influenza Split 04/21/2012   Influenza Whole 06/11/2007, 05/25/2008, 05/06/2009, 04/10/2010   Influenza,inj,Quad PF,6+ Mos 04/20/2013, 05/01/2016, 05/03/2017, 04/25/2018, 04/07/2019, 04/13/2020   Influenza,inj,quad, With Preservative 05/11/2014, 05/16/2015   Moderna Sars-Covid-2 Vaccination 10/08/2019, 11/05/2019, 06/22/2020, 09/27/2020   Pneumococcal Conjugate-13 12/26/2015   Pneumococcal Polysaccharide-23 02/15/2017   RSV,unspecified 08/04/2022   Td 12/13/1994, 04/10/2010   Td (Adult) 04/10/2010    TDAP status: Due, Education has been provided regarding the importance of this vaccine. Advised may receive  this vaccine at local pharmacy or Health Dept. Aware to provide a copy of the vaccination record if obtained from local pharmacy or Health Dept. Verbalized acceptance and understanding.  Flu Vaccine status: Due, Education has been provided regarding the importance of this vaccine. Advised may receive this vaccine at local pharmacy or Health Dept. Aware to provide a copy of the vaccination record if obtained from local pharmacy or Health Dept. Verbalized acceptance and understanding.  Pneumococcal vaccine status: Up to date  Covid-19 vaccine status: Information provided on how to obtain vaccines.   Qualifies for Shingles Vaccine? Yes   Zostavax completed No   Shingrix Completed?: No.    Education has been provided regarding the importance of this vaccine. Patient has been advised to call insurance company to determine out of pocket expense if they have not yet received this vaccine. Advised may also receive vaccine at local pharmacy or Health Dept. Verbalized acceptance and understanding.  Screening Tests Health Maintenance  Topic Date Due   Hepatitis C Screening  Never done   INFLUENZA VACCINE  03/14/2023   HEMOGLOBIN A1C  04/04/2023   COVID-19 Vaccine (5 - 2023-24 season) 04/14/2023   MAMMOGRAM  08/11/2023 (Originally 05/02/2022)   FOOT EXAM  08/12/2023 (Originally 09/14/2014)   OPHTHALMOLOGY EXAM  08/13/2023 (Originally 02/26/1961)   Colonoscopy  08/13/2023 (Originally 09/18/2018)   Diabetic kidney evaluation - Urine ACR  02/19/2024   Diabetic kidney evaluation - eGFR measurement  04/21/2024   Medicare Annual Wellness (AWV)  06/03/2024   Pneumonia Vaccine 74+ Years old  Completed   DEXA SCAN  Completed   HPV VACCINES  Aged Out   DTaP/Tdap/Td  Discontinued   Zoster Vaccines- Shingrix  Discontinued    Health Maintenance  Health Maintenance Due  Topic Date Due   Hepatitis C Screening  Never done   INFLUENZA VACCINE  03/14/2023   HEMOGLOBIN A1C  04/04/2023   COVID-19 Vaccine (5 -  2023-24 season) 04/14/2023    Colorectal cancer screening: No longer required.   Mammogram status: patient to schedule  Bone Density status: Completed 09/19/2022.   Lung Cancer Screening: (Low Dose CT Chest recommended if Age 36-80 years, 20 pack-year currently smoking OR have quit w/in 15years.) does not qualify.   Lung Cancer Screening Referral: no  Additional Screening:  Hepatitis C Screening: does qualify;  Vision Screening: Recommended annual ophthalmology exams for early detection of glaucoma and other disorders of the eye. Is the patient up to date with their annual eye exam?  Yes  Who  is the provider or what is the name of the office in which the patient attends annual eye exams? [facility] If pt is not established with a provider, would they like to be referred to a provider to establish care? No .   Dental Screening: Recommended annual dental exams for proper oral hygiene  Diabetic Foot Exam: Diabetic Foot Exam: Overdue, Pt has been advised about the importance in completing this exam. Pt is scheduled for diabetic foot exam on next appointment.  Community Resource Referral / Chronic Care Management: CRR required this visit?  No   CCM required this visit?  No     Plan:     I have personally reviewed and noted the following in the patient's chart:   Medical and social history Use of alcohol, tobacco or illicit drugs  Current medications and supplements including opioid prescriptions. Patient is not currently taking opioid prescriptions. Functional ability and status Nutritional status Physical activity Advanced directives List of other physicians Hospitalizations, surgeries, and ER visits in previous 12 months Vitals Screenings to include cognitive, depression, and falls Referrals and appointments  In addition, I have reviewed and discussed with patient certain preventive protocols, quality metrics, and best practice recommendations. A written  personalized care plan for preventive services as well as general preventive health recommendations were provided to patient.     Barb Merino, LPN   [id number]   After Visit Summary: (Pick Up) Due to this being a telephonic visit, with patients personalized plan was offered to patient and patient has requested to Pick up at office.  Nurse Notes: none

## 2023-06-04 NOTE — Telephone Encounter (Signed)
pt called in wanting new script for pharmacy to take  of the traZODone because it helps her sleep better instead of just one.

## 2023-06-05 ENCOUNTER — Other Ambulatory Visit: Payer: Self-pay | Admitting: Family Medicine

## 2023-06-05 MED ORDER — TRAZODONE HCL 100 MG PO TABS: 100.0000 mg | ORAL_TABLET | Freq: Every day | ORAL | 1 refills | Status: DC

## 2023-06-05 NOTE — Telephone Encounter (Signed)
Provider will discuss medication w/pt at appointment on 06/11/23

## 2023-06-11 ENCOUNTER — Encounter: Payer: Self-pay | Admitting: Family Medicine

## 2023-06-11 ENCOUNTER — Ambulatory Visit: Payer: Medicare HMO | Admitting: Family Medicine

## 2023-06-11 VITALS — BP 121/72 | HR 80 | Temp 98.4°F | Resp 16 | Ht 61.0 in | Wt 156.4 lb

## 2023-06-11 DIAGNOSIS — F419 Anxiety disorder, unspecified: Secondary | ICD-10-CM

## 2023-06-11 DIAGNOSIS — K219 Gastro-esophageal reflux disease without esophagitis: Secondary | ICD-10-CM

## 2023-06-11 DIAGNOSIS — F32A Depression, unspecified: Secondary | ICD-10-CM

## 2023-06-11 DIAGNOSIS — E042 Nontoxic multinodular goiter: Secondary | ICD-10-CM | POA: Diagnosis not present

## 2023-06-11 DIAGNOSIS — E7800 Pure hypercholesterolemia, unspecified: Secondary | ICD-10-CM | POA: Diagnosis not present

## 2023-06-11 DIAGNOSIS — M25559 Pain in unspecified hip: Secondary | ICD-10-CM

## 2023-06-11 DIAGNOSIS — Z23 Encounter for immunization: Secondary | ICD-10-CM

## 2023-06-11 DIAGNOSIS — I10 Essential (primary) hypertension: Secondary | ICD-10-CM

## 2023-06-11 MED ORDER — LEVOTHYROXINE SODIUM 25 MCG PO TABS
25.0000 ug | ORAL_TABLET | Freq: Every day | ORAL | 1 refills | Status: DC
Start: 2023-06-11 — End: 2023-12-09

## 2023-06-11 MED ORDER — PANTOPRAZOLE SODIUM 40 MG PO TBEC
40.0000 mg | DELAYED_RELEASE_TABLET | Freq: Every day | ORAL | 1 refills | Status: DC
Start: 2023-06-11 — End: 2023-08-19

## 2023-06-11 MED ORDER — LIDOCAINE 5 % EX PTCH: 1.0000 | MEDICATED_PATCH | CUTANEOUS | 5 refills | Status: DC

## 2023-06-11 MED ORDER — BUPROPION HCL ER (XL) 150 MG PO TB24: 150.0000 mg | ORAL_TABLET | Freq: Every day | ORAL | 1 refills | Status: DC

## 2023-06-11 MED ORDER — PRAVASTATIN SODIUM 10 MG PO TABS
10.0000 mg | ORAL_TABLET | Freq: Every day | ORAL | 1 refills | Status: DC
Start: 2023-06-11 — End: 2023-12-09

## 2023-06-11 MED ORDER — SERTRALINE HCL 100 MG PO TABS
100.0000 mg | ORAL_TABLET | Freq: Every day | ORAL | 1 refills | Status: DC
Start: 2023-06-11 — End: 2023-12-11

## 2023-06-13 ENCOUNTER — Encounter: Payer: Self-pay | Admitting: Family Medicine

## 2023-06-13 NOTE — Progress Notes (Signed)
Established Patient Office Visit  Subjective    Patient ID: Jenny Cardenas, female    DOB: 07/24/1951  Age: 72 y.o. MRN: [id number]  CC:  Chief Complaint  Patient presents with   Follow-up    6 months    HPI Jenny Cardenas presents for follow up of multiple chronic med issues including hypertension, anxiety and Gerd. She also reports that she has had intermittent back pain that is helped with lidocaine patches.   Outpatient Encounter Medications as of 06/11/2023  Medication Sig   albuterol (PROVENTIL) (2.5 MG/3ML) 0.083% nebulizer solution Take 3 mLs (2.5 mg total) by nebulization every 4 (four) hours as needed for wheezing or shortness of breath.   albuterol (VENTOLIN HFA) 108 (90 Base) MCG/ACT inhaler Inhale 1-2 puffs into the lungs every 6 (six) hours as needed for wheezing or shortness of breath. This is a one-time refill from me until the patient establish care with her primary care physician.  Please do not send request for refill to me in the future.   cyanocobalamin (VITAMIN B12) 1000 MCG tablet Take 1 tablet (1,000 mcg total) by mouth daily.   lidocaine (LIDODERM) 5 % Place 1 patch onto the skin daily. Remove & Discard patch within 12 hours or as directed by MD   Multiple Vitamins-Minerals (PRESERVISION AREDS 2 PO) Take 1 capsule by mouth in the morning and at bedtime.   rivastigmine (EXELON) 1.5 MG capsule Take 1 capsule (1.5 mg total) by mouth 2 (two) times daily.   traZODone (DESYREL) 100 MG tablet Take 1 tablet (100 mg total) by mouth at bedtime.   [DISCONTINUED] buPROPion (WELLBUTRIN XL) 150 MG 24 hr tablet Take 1 tablet (150 mg total) by mouth daily.   [DISCONTINUED] levothyroxine (SYNTHROID) 25 MCG tablet Take 1 tablet (25 mcg total) by mouth daily before breakfast.   [DISCONTINUED] pravastatin (PRAVACHOL) 10 MG tablet Take 1 tablet (10 mg total) by mouth daily.   [DISCONTINUED] sertraline (ZOLOFT) 100 MG tablet Take 1 tablet (100 mg total) by mouth daily.   ALPRAZolam  (XANAX) 0.5 MG tablet Take 1 tablet (0.5 mg total) by mouth 3 (three) times daily as needed for anxiety. (Patient not taking: Reported on 06/04/2023)   buPROPion (WELLBUTRIN XL) 150 MG 24 hr tablet Take 1 tablet (150 mg total) by mouth daily.   fluocinonide cream (LIDEX) 0.05 % Apply 1 Application topically 2 (two) times daily. (Patient not taking: Reported on 06/11/2023)   ibuprofen (ADVIL) 200 MG tablet Take 200 mg by mouth as needed. (Patient not taking: Reported on 06/04/2023)   Ibuprofen-diphenhydrAMINE Cit (MOTRIN PM PO) Take 1 tablet by mouth at bedtime as needed.   levocetirizine (XYZAL) 5 MG tablet Take 5 mg by mouth daily as needed for allergies. (Patient not taking: Reported on 06/11/2023)   levothyroxine (SYNTHROID) 25 MCG tablet Take 1 tablet (25 mcg total) by mouth daily before breakfast.   pantoprazole (PROTONIX) 40 MG tablet Take 1 tablet (40 mg total) by mouth daily.   pravastatin (PRAVACHOL) 10 MG tablet Take 1 tablet (10 mg total) by mouth daily.   PRESCRIPTION MEDICATION Natrol memory complex daily (Patient not taking: Reported on 06/04/2023)   Probiotic Product (PROBIOTIC DAILY PO) Take by mouth daily.   sertraline (ZOLOFT) 100 MG tablet Take 1 tablet (100 mg total) by mouth daily.   traMADol (ULTRAM) 50 MG tablet Take 1 tablet (50 mg total) by mouth every 6 (six) hours as needed (mild pain). (Patient not taking: Reported on 06/04/2023)   [  DISCONTINUED] pantoprazole (PROTONIX) 40 MG tablet Take 1 tablet (40 mg total) by mouth daily. (Patient not taking: Reported on 06/04/2023)   No facility-administered encounter medications on file as of 06/11/2023.    Past Medical History:  Diagnosis Date   ADHD (attention deficit hyperactivity disorder) 2020   ADD   Anxiety    Arthritis    Broken heart syndrome 2013   Carpal tunnel syndrome    Cataract    Chronic kidney disease    Depression    Diabetes mellitus without complication (HCC)    Type 2   Dysrhythmia 1976    tachycardia   Fall 04/10/2015   GERD (gastroesophageal reflux disease)    Goiter    History of shingles    On abdomen, left ring finger, and left leg; Pt takes Valtrex   Hyperlipidemia    Hypertension    Hypothyroidism    lung ca 04/2021   Obesity    PTSD (post-traumatic stress disorder)    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   Shortness of breath dyspnea    pt stated its related to the back problems she has   Thyroid disease    Ulcer     Past Surgical History:  Procedure Laterality Date   ABDOMINAL HYSTERECTOMY  1996   BRONCHIAL BRUSHINGS  06/29/2021   Procedure: BRONCHIAL BRUSHINGS;  Surgeon: Leslye Peer, MD;  Location: [facility] ENDOSCOPY;  Service: Pulmonary;;   BRONCHIAL NEEDLE ASPIRATION BIOPSY  06/29/2021   Procedure: BRONCHIAL NEEDLE ASPIRATION BIOPSIES;  Surgeon: Leslye Peer, MD;  Location: [facility] ENDOSCOPY;  Service: Pulmonary;;   CATARACT EXTRACTION, BILATERAL     CESAREAN SECTION  1976, 1983, 1984   COLONOSCOPY W/ POLYPECTOMY     EYE SURGERY Bilateral 2014   cataratact and lasik surgery   FIDUCIAL MARKER PLACEMENT  06/29/2021   Procedure: FIDUCIAL DYE MARKING;  Surgeon: Leslye Peer, MD;  Location: [facility] ENDOSCOPY;  Service: Pulmonary;;   INTERCOSTAL NERVE BLOCK  06/29/2021   Procedure: INTERCOSTAL NERVE BLOCK;  Surgeon: Corliss Skains, MD;  Location: [facility] OR;  Service: Thoracic;;   KYPHOPLASTY N/A 06/02/2015   Procedure: KYPHOPLASTY;  Surgeon: Estill Bamberg, MD;  Location: [facility] OR;  Service: Orthopedics;  Laterality: N/A;  Thoracic 3, 8, 10 kyphoplasty   LOBECTOMY  06/29/2021   Procedure: RIGHT UPPER LOBECTOMY;  Surgeon: Corliss Skains, MD;  Location: [facility] OR;  Service: Thoracic;;   LYMPH NODE DISSECTION  06/29/2021   Procedure: LYMPH NODE DISSECTION;  Surgeon: Corliss Skains, MD;  Location: [facility] OR;  Service: Thoracic;;    Family History  Problem Relation Age of Onset   COPD Mother    Depression Mother    Hypertension Mother    Hyperlipidemia Mother     Breast cancer Mother 65   Diverticulosis Mother    Heart disease Father    Dementia Father    Breast cancer Sister 25   Non-Hodgkin's lymphoma Sister    Colon cancer Neg Hx     Social History   Socioeconomic History   Marital status: Widowed    Spouse name: Not on file   Number of children: 2   Years of education: Not on file   Highest education level: Master's degree (e.g., MA, MS, MEng, MEd, MSW, MBA)  Occupational History   Not on file  Tobacco Use   Smoking status: Former    Current packs/day: 0.00    Average packs/day: 0.5 packs/day for 4.0 years (2.0 ttl pk-yrs)  Types: Cigarettes    Start date: 06/28/2012    Quit date: 06/28/2016    Years since quitting: 6.9   Smokeless tobacco: Never  Vaping Use   Vaping status: Never Used  Substance and Sexual Activity   Alcohol use: Not Currently    Comment: rare glass of wine (holidays only)   Drug use: Never   Sexual activity: Not Currently  Other Topics Concern   Not on file  Social History Narrative   Not on file   Social Determinants of Health   Financial Resource Strain: Low Risk  (06/04/2023)   Overall Financial Resource Strain (CARDIA)    Difficulty of Paying Living Expenses: Not hard at all  Food Insecurity: No Food Insecurity (06/04/2023)   Hunger Vital Sign    Worried About Running Out of Food in the Last Year: Never true    Ran Out of Food in the Last Year: Never true  Transportation Needs: No Transportation Needs (06/04/2023)   PRAPARE - Administrator, Civil Service (Medical): No    Lack of Transportation (Non-Medical): No  Physical Activity: Sufficiently Active (06/04/2023)   Exercise Vital Sign    Days of Exercise per Week: 6 days    Minutes of Exercise per Session: 60 min  Recent Concern: Physical Activity - Inactive (06/04/2023)   Exercise Vital Sign    Days of Exercise per Week: 0 days    Minutes of Exercise per Session: 0 min  Stress: No Stress Concern Present (06/04/2023)    [employer] of Occupational Health - Occupational Stress Questionnaire    Feeling of Stress : Only a little  Recent Concern: Stress - Stress Concern Present (06/04/2023)   [employer] of Occupational Health - Occupational Stress Questionnaire    Feeling of Stress : To some extent  Social Connections: Moderately Isolated (06/04/2023)   Social Connection and Isolation Panel [NHANES]    Frequency of Communication with Friends and Family: More than three times a week    Frequency of Social Gatherings with Friends and Family: More than three times a week    Attends Religious Services: More than 4 times per year    Active Member of [employer] or Organizations: No    Attends Banker Meetings: Never    Marital Status: Widowed  Intimate Partner Violence: Not At Risk (06/04/2023)   Humiliation, Afraid, Rape, and Kick questionnaire    Fear of Current or Ex-Partner: No    Emotionally Abused: No    Physically Abused: No    Sexually Abused: No    Review of Systems  All other systems reviewed and are negative.       Objective    BP 121/72 (BP Location: Right Arm, Patient Position: Sitting, Cuff Size: Normal)   Pulse 80   Temp 98.4 F (36.9 C) (Oral)   Resp 16   Ht 5\' 1"  (1.549 m)   Wt 156 lb 6.4 oz (70.9 kg)   SpO2 95%   BMI 29.55 kg/m   Physical Exam Vitals and nursing note reviewed.  Constitutional:      General: She is not in acute distress. Cardiovascular:     Rate and Rhythm: Normal rate and regular rhythm.  Pulmonary:     Effort: Pulmonary effort is normal.     Breath sounds: Normal breath sounds.  Abdominal:     Palpations: Abdomen is soft.     Tenderness: There is no abdominal tenderness.  Neurological:     General: No focal deficit  present.     Mental Status: She is alert and oriented to person, place, and time.         Assessment & Plan:   Essential hypertension  Anxiety and depression -     Sertraline HCl; Take 1 tablet (100 mg total)  by mouth daily.  Dispense: 90 tablet; Refill: 1 -     buPROPion HCl ER (XL); Take 1 tablet (150 mg total) by mouth daily.  Dispense: 90 tablet; Refill: 1  Pure hypercholesterolemia -     Pravastatin Sodium; Take 1 tablet (10 mg total) by mouth daily.  Dispense: 90 tablet; Refill: 1  Gastroesophageal reflux disease without esophagitis -     Pantoprazole Sodium; Take 1 tablet (40 mg total) by mouth daily.  Dispense: 90 tablet; Refill: 1  Multinodular goiter -     Levothyroxine Sodium; Take 1 tablet (25 mcg total) by mouth daily before breakfast.  Dispense: 90 tablet; Refill: 1  Hip pain, unspecified laterality  Encounter for immunization -     Flu Vaccine Trivalent High Dose (Fluad)  Other orders -     Lidocaine; Place 1 patch onto the skin daily. Remove & Discard patch within 12 hours or as directed by MD  Dispense: 30 patch; Refill: 5     No follow-ups on file.   Tommie Raymond, MD

## 2023-06-14 ENCOUNTER — Other Ambulatory Visit: Payer: Self-pay

## 2023-06-17 ENCOUNTER — Telehealth: Payer: Self-pay | Admitting: Family Medicine

## 2023-06-17 NOTE — Telephone Encounter (Signed)
Copied from CRM [id number]. Topic: General - Other >> Jun 17, 2023  9:49 AM Franchot Heidelberg wrote: Reason for CRM: Pt called requesting that her PCP provides a prior authorization approval for her Lidocaine prescription. Pt also says she is open to getting this from a different pharmacy >> Jun 17, 2023  9:52 AM Franchot Heidelberg wrote: [facility] - [place], [place] - [address]  [address] [place] [address]  Phone: [phone] Fax: [phone]   >> Jun 17, 2023  9:51 AM Franchot Heidelberg wrote: This is for lidocaine (LIDODERM) 5 %

## 2023-06-17 NOTE — Telephone Encounter (Signed)
I have attempted without success to contact this patient by phone to return their call and I left a message on answering machine  Lidocaine patches has been approved .

## 2023-06-18 ENCOUNTER — Other Ambulatory Visit: Payer: Self-pay

## 2023-08-19 ENCOUNTER — Other Ambulatory Visit: Payer: Self-pay | Admitting: Family Medicine

## 2023-08-19 DIAGNOSIS — K219 Gastro-esophageal reflux disease without esophagitis: Secondary | ICD-10-CM

## 2023-08-19 NOTE — Telephone Encounter (Signed)
 Medication Refill -  Most Recent Primary Care Visit:  Provider: TANDA BLEACHER  Department: [facility]  Visit Type: OFFICE VISIT  Date: 06/11/2023  Medication: pantoprazole  (PROTONIX ) 40 MG tablet   Has the patient contacted their pharmacy? Yes   Is this the correct pharmacy for this prescription? Yes  This is the patient's preferred pharmacy:  [facility] - [place], [place] - [address] [place] [address] Phone: [phone] Fax: [phone]   Has the prescription been filled recently? Yes  Is the patient out of the medication? Yes  Has the patient been seen for an appointment in the last year OR does the patient have an upcoming appointment? Yes  Can we respond through MyChart? No  Agent: Please be advised that Rx refills may take up to 3 business days. We ask that you follow-up with your pharmacy.

## 2023-08-21 ENCOUNTER — Other Ambulatory Visit: Payer: Self-pay | Admitting: Family Medicine

## 2023-08-21 DIAGNOSIS — Z1231 Encounter for screening mammogram for malignant neoplasm of breast: Secondary | ICD-10-CM

## 2023-08-21 MED ORDER — PANTOPRAZOLE SODIUM 40 MG PO TBEC: 40.0000 mg | DELAYED_RELEASE_TABLET | Freq: Every day | ORAL | 0 refills | Status: DC

## 2023-08-21 NOTE — Telephone Encounter (Signed)
 Medication Refill -  Most Recent Primary Care Visit:  Provider: TANDA BLEACHER  Department: [facility]  Visit Type: OFFICE VISIT  Date: 06/11/2023  Medication: lidocaine  (LIDODERM ) 5 %   Has the patient contacted their pharmacy? no Pt mentioned adding this med, when calling about a different med  Is this the correct pharmacy for this prescription? yes  This is the patient's preferred pharmacy:  [facility] - [place], [place] - [address] [place] [address] Phone: [phone] Fax: [phone]   Has the prescription been filled recently? no  Is the patient out of the medication? No has 6 left  Has the patient been seen for an appointment in the last year OR does the patient have an upcoming appointment? yes  Can we respond through MyChart? yes  Agent: Please be advised that Rx refills may take up to 3 business days. We ask that you follow-up with your pharmacy.

## 2023-08-21 NOTE — Telephone Encounter (Signed)
 Requested Prescriptions  Pending Prescriptions Disp Refills   pantoprazole  (PROTONIX ) 40 MG tablet 90 tablet 0    Sig: Take 1 tablet (40 mg total) by mouth daily.     Gastroenterology: Proton Pump Inhibitors Passed - 08/21/2023  3:48 PM      Passed - Valid encounter within last 12 months    Recent Outpatient Visits           2 months ago Essential hypertension   [place] [facility] Rag[place], MD   6 months ago Type 2 diabetes mellitus with other specified complication, with long-term current use of insulin  [facility])   [place] [facility], MD   9 months ago Anxiety and depression   [place] [facility] Raguel, MD   10 months ago Diabetes mellitus type 2 in obese [facility])   [place] [facility], MD   1 year ago Essential hypertension   [place] [facility], MD       Future Appointments             In 3 weeks Tanda Raguel, MD [facility]

## 2023-09-11 ENCOUNTER — Encounter: Payer: Self-pay | Admitting: Family Medicine

## 2023-09-11 ENCOUNTER — Ambulatory Visit: Payer: Medicare HMO | Admitting: Family Medicine

## 2023-09-11 VITALS — BP 114/66 | HR 89 | Temp 98.7°F | Resp 16 | Ht 60.0 in | Wt 156.0 lb

## 2023-09-11 DIAGNOSIS — I10 Essential (primary) hypertension: Secondary | ICD-10-CM

## 2023-09-11 DIAGNOSIS — G3184 Mild cognitive impairment, so stated: Secondary | ICD-10-CM

## 2023-09-11 DIAGNOSIS — R7303 Prediabetes: Secondary | ICD-10-CM | POA: Diagnosis not present

## 2023-09-11 DIAGNOSIS — F32A Depression, unspecified: Secondary | ICD-10-CM

## 2023-09-11 DIAGNOSIS — R413 Other amnesia: Secondary | ICD-10-CM

## 2023-09-11 DIAGNOSIS — F419 Anxiety disorder, unspecified: Secondary | ICD-10-CM | POA: Diagnosis not present

## 2023-09-11 LAB — POCT GLYCOSYLATED HEMOGLOBIN (HGB A1C): Hemoglobin A1C: 6 % — AB (ref 4.0–5.6)

## 2023-09-11 MED ORDER — BUPROPION HCL ER (XL) 300 MG PO TB24: 300.0000 mg | ORAL_TABLET | Freq: Every day | ORAL | 0 refills | Status: DC

## 2023-09-12 ENCOUNTER — Encounter: Payer: Self-pay | Admitting: Family Medicine

## 2023-09-12 NOTE — Progress Notes (Signed)
Established Patient Office Visit  Subjective    Patient ID: Jenny Cardenas, female    DOB: 1950-08-29  Age: 73 y.o. MRN: [id number]  CC:  Chief Complaint  Patient presents with   Follow-up    3 month    HPI Jenny Cardenas presents for routine follow up of chronic med issues including hypertension and anxiety. Patient reports that she has increased social stressors and feels that her anxiety is worsening. She is also concerned as she feels that her memory is worsening. She was seen by neuro recently who diagnosed her with mild cognitive disorder.   Outpatient Encounter Medications as of 09/11/2023  Medication Sig   albuterol (PROVENTIL) (2.5 MG/3ML) 0.083% nebulizer solution Take 3 mLs (2.5 mg total) by nebulization every 4 (four) hours as needed for wheezing or shortness of breath.   albuterol (VENTOLIN HFA) 108 (90 Base) MCG/ACT inhaler Inhale 1-2 puffs into the lungs every 6 (six) hours as needed for wheezing or shortness of breath. This is a one-time refill from me until the patient establish care with her primary care physician.  Please do not send request for refill to me in the future.   buPROPion (WELLBUTRIN XL) 150 MG 24 hr tablet Take 1 tablet (150 mg total) by mouth daily.   buPROPion (WELLBUTRIN XL) 300 MG 24 hr tablet Take 1 tablet (300 mg total) by mouth daily.   cyanocobalamin (VITAMIN B12) 1000 MCG tablet Take 1 tablet (1,000 mcg total) by mouth daily.   levothyroxine (SYNTHROID) 25 MCG tablet Take 1 tablet (25 mcg total) by mouth daily before breakfast.   lidocaine (LIDODERM) 5 % Place 1 patch onto the skin daily. Remove & Discard patch within 12 hours or as directed by MD   Multiple Vitamins-Minerals (PRESERVISION AREDS 2 PO) Take 1 capsule by mouth in the morning and at bedtime.   pantoprazole (PROTONIX) 40 MG tablet Take 1 tablet (40 mg total) by mouth daily.   pravastatin (PRAVACHOL) 10 MG tablet Take 1 tablet (10 mg total) by mouth daily.   Probiotic Product (PROBIOTIC  DAILY PO) Take by mouth daily.   rivastigmine (EXELON) 1.5 MG capsule Take 1 capsule (1.5 mg total) by mouth 2 (two) times daily.   sertraline (ZOLOFT) 100 MG tablet Take 1 tablet (100 mg total) by mouth daily.   traZODone (DESYREL) 100 MG tablet Take 1 tablet (100 mg total) by mouth at bedtime.   ALPRAZolam (XANAX) 0.5 MG tablet Take 1 tablet (0.5 mg total) by mouth 3 (three) times daily as needed for anxiety. (Patient not taking: Reported on 06/04/2023)   fluocinonide cream (LIDEX) 0.05 % Apply 1 Application topically 2 (two) times daily. (Patient not taking: Reported on 06/11/2023)   ibuprofen (ADVIL) 200 MG tablet Take 200 mg by mouth as needed. (Patient not taking: Reported on 06/04/2023)   Ibuprofen-diphenhydrAMINE Cit (MOTRIN PM PO) Take 1 tablet by mouth at bedtime as needed.   levocetirizine (XYZAL) 5 MG tablet Take 5 mg by mouth daily as needed for allergies. (Patient not taking: Reported on 06/11/2023)   PRESCRIPTION MEDICATION Natrol memory complex daily (Patient not taking: Reported on 06/04/2023)   traMADol (ULTRAM) 50 MG tablet Take 1 tablet (50 mg total) by mouth every 6 (six) hours as needed (mild pain). (Patient not taking: Reported on 09/11/2023)   No facility-administered encounter medications on file as of 09/11/2023.    Past Medical History:  Diagnosis Date   ADHD (attention deficit hyperactivity disorder) 2020   ADD  Anxiety    Arthritis    Broken heart syndrome 2013   Carpal tunnel syndrome    Cataract    Chronic kidney disease    Depression    Diabetes mellitus without complication (HCC)    Type 2   Dysrhythmia 1976   tachycardia   Fall 04/10/2015   GERD (gastroesophageal reflux disease)    Goiter    History of shingles    On abdomen, left ring finger, and left leg; Pt takes Valtrex   Hyperlipidemia    Hypertension    Hypothyroidism    lung ca 04/2021   Obesity    PTSD (post-traumatic stress disorder)    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   Shortness  of breath dyspnea    pt stated its related to the back problems she has   Thyroid disease    Ulcer     Past Surgical History:  Procedure Laterality Date   ABDOMINAL HYSTERECTOMY  1996   BRONCHIAL BRUSHINGS  06/29/2021   Procedure: BRONCHIAL BRUSHINGS;  Surgeon: Leslye Peer, MD;  Location: [facility] END[facility]COPY;  Service: Pulmonary;;   BRONCHIAL NEEDLE ASPIRATION BIOPSY  06/29/2021   Procedure: BRONCHIAL NEEDLE ASPIRATION BIOPSIES;  Surgeon: Leslye Peer, MD;  Location: [facility] ENDOSCOPY;  Service: Pulmonary;;   CATARACT EXTRACTION, BILATERAL     CESAREAN SECTION  1976, 1983, 1984   COLONOSCOPY W/ POLYPECTOMY     EYE SURGERY Bilateral 2014   cataratact and lasik surgery   FIDUCIAL MARKER PLACEMENT  06/29/2021   Procedure: FIDUCIAL DYE MARKING;  Surgeon: Leslye Peer, MD;  Location: [facility] ENDOSCOPY;  Service: Pulmonary;;   INTERCOSTAL NERVE BLOCK  06/29/2021   Procedure: INTERCOSTAL NERVE BLOCK;  Surgeon: Corliss Skains, MD;  Location: [facility] OR;  Service: Thoracic;;   KYPHOPLASTY N/A 06/02/2015   Procedure: KYPHOPLASTY;  Surgeon: Estill Bamberg, MD;  Location: [facility] OR;  Service: Orthopedics;  Laterality: N/A;  Thoracic 3, 8, 10 kyphoplasty   LOBECTOMY  06/29/2021   Procedure: RIGHT UPPER LOBECTOMY;  Surgeon: Corliss Skains, MD;  Location: [facility] OR;  Service: Thoracic;;   LYMPH NODE DISSECTION  06/29/2021   Procedure: LYMPH NODE DISSECTION;  Surgeon: Corliss Skains, MD;  Location: [facility] OR;  Service: Thoracic;;    Family History  Problem Relation Age of Onset   COPD Mother    Depression Mother    Hypertension Mother    Hyperlipidemia Mother    Breast cancer Mother 38   Diverticulosis Mother    Heart disease Father    Dementia Father    Breast cancer Sister 98   Non-Hodgkin's lymphoma Sister    Colon cancer Neg Hx     Social History   Socioeconomic History   Marital status: Widowed    Spouse name: Not on file   Number of children: 2   Years of education: Not on file    Highest education level: Master's degree (e.g., MA, MS, MEng, MEd, MSW, MBA)  Occupational History   Not on file  Tobacco Use   Smoking status: Former    Current packs/day: 0.00    Average packs/day: 0.5 packs/day for 4.0 years (2.0 ttl pk-yrs)    Types: Cigarettes    Start date: 06/28/2012    Quit date: 06/28/2016    Years since quitting: 7.2   Smokeless tobacco: Never  Vaping Use   Vaping status: Never Used  Substance and Sexual Activity   Alcohol use: Not Currently    Comment: rare glass of wine (  holidays only)   Drug use: Never   Sexual activity: Not Currently  Other Topics Concern   Not on file  Social History Narrative   Not on file   Social Drivers of Health   Financial Resource Strain: Low Risk  (06/04/2023)   Overall Financial Resource Strain (CARDIA)    Difficulty of Paying Living Expenses: Not hard at all  Food Insecurity: No Food Insecurity (06/04/2023)   Hunger Vital Sign    Worried About Running Out of Food in the Last Year: Never true    Ran Out of Food in the Last Year: Never true  Transportation Needs: No Transportation Needs (06/04/2023)   PRAPARE - Administrator, Civil Service (Medical): No    Lack of Transportation (Non-Medical): No  Physical Activity: Sufficiently Active (06/04/2023)   Exercise Vital Sign    Days of Exercise per Week: 6 days    Minutes of Exercise per Session: 60 min  Recent Concern: Physical Activity - Inactive (06/04/2023)   Exercise Vital Sign    Days of Exercise per Week: 0 days    Minutes of Exercise per Session: 0 min  Stress: No Stress Concern Present (06/04/2023)   [employer] of Occupational Health - Occupational Stress Questionnaire    Feeling of Stress : Only a little  Recent Concern: Stress - Stress Concern Present (06/04/2023)   [employer] of Occupational Health - Occupational Stress Questionnaire    Feeling of Stress : To some extent  Social Connections: Moderately Isolated (06/04/2023)    Social Connection and Isolation Panel [NHANES]    Frequency of Communication with Friends and Family: More than three times a week    Frequency of Social Gatherings with Friends and Family: More than three times a week    Attends Religious Services: More than 4 times per year    Active Member of [employer] or Organizations: No    Attends Banker Meetings: Never    Marital Status: Widowed  Intimate Partner Violence: Not At Risk (06/04/2023)   Humiliation, Afraid, Rape, and Kick questionnaire    Fear of Current or Ex-Partner: No    Emotionally Abused: No    Physically Abused: No    Sexually Abused: No    Review of Systems  Psychiatric/Behavioral:  Positive for depression. Negative for suicidal ideas.   All other systems reviewed and are negative.       Objective    BP 114/66 (BP Location: Left Arm, Patient Position: Sitting, Cuff Size: Normal)   Pulse 89   Temp 98.7 F (37.1 C) (Oral)   Resp 16   Ht 5' (1.524 m)   Wt 156 lb (70.8 kg)   SpO2 94%   BMI 30.47 kg/m   Physical Exam Vitals and nursing note reviewed.  Constitutional:      General: She is not in acute distress. Cardiovascular:     Rate and Rhythm: Normal rate and regular rhythm.  Pulmonary:     Effort: Pulmonary effort is normal.     Breath sounds: Normal breath sounds.  Abdominal:     Palpations: Abdomen is soft.     Tenderness: There is no abdominal tenderness.  Neurological:     General: No focal deficit present.     Mental Status: She is alert and oriented to person, place, and time.  Psychiatric:        Mood and Affect: Mood normal.        Behavior: Behavior normal.  Assessment & Plan:  1. Essential hypertension (Primary) Appears stable. Continue   2. Anxiety and depression Will increase wellbutrin from 150 to 300mg  daily  3. Mild cognitive impairment Management as per neuro.  4. Prediabetes Improved A1c and at goal. continue - POCT glycosylated hemoglobin (Hb  A1C)  Return in about 3 months (around 12/10/2023) for follow up.   Tommie Raymond, MD

## 2023-09-18 ENCOUNTER — Ambulatory Visit: Payer: Self-pay | Admitting: Family Medicine

## 2023-09-18 NOTE — Telephone Encounter (Signed)
 Copied from CRM [id number]. Topic: Clinical - Medication Question >> Sep 18, 2023  2:16 PM Ivette P wrote: Reason for CRM: Pt told daughter that she should be taking 2 pills a day but is unsure. Daughter would like a call back to be able to explain the medications and which medication she needs to take and how many. Please Call [phone]  buPROPion  (WELLBUTRIN  XL) 300 MG 24 hr tablet   Chief Complaint: Information Only Call Symptoms: None Frequency: None Disposition: [] ED /[] Urgent Care (no appt availability in office) / [] Appointment(In office/virtual)/ []  [facility]/ [] Home Care/ [] Refused Recommended Disposition /[]  Mobile Bus/ [x]  Follow-up with PCP Additional Notes: Spoke with daughter, Powell, regarding medication list to clarify the order for Wellbutrin  XL 300 mg. Clarified order.   Contact Heather regarding orders for a request, Home Health Aide at [phone], per family request. POC asks for STRICT confidentiality so please contact Heather directly to not cause confusion with the patient.     Reason for Disposition  Caller has medicine question only, adult not sick, AND triager answers question  Answer Assessment - Initial Assessment Questions 1. REASON FOR CALL or QUESTION: What is your reason for calling today? or How can I best help you? or What question do you have that I can help answer?     Medication Question  Answer Assessment - Initial Assessment Questions 1. NAME of MEDICINE: What medicine(s) are you calling about?     Wellbutrin   2. QUESTION: What is your question? (e.g., double dose of medicine, side effect)     How many is my mom supposed to take?  3. PRESCRIBER: Who prescribed the medicine? Reason: if prescribed by specialist, call should be referred to that group.     Dr. Hanford Blush  4. SYMPTOMS: Do you have any symptoms? If Yes, ask: What symptoms are you having?  How bad are the symptoms (e.g., mild, moderate,  severe)     No  5. PREGNANCY:  Is there any chance that you are pregnant? When was your last menstrual period?     No  Protocols used: Information Only Call - No Triage-A-AH, Medication Question Call-A-AH

## 2023-09-23 NOTE — Telephone Encounter (Signed)
 I have attempted without success to contact this patient by phone to return their call and I left a message on answering machine.

## 2023-09-24 ENCOUNTER — Ambulatory Visit: Payer: Medicare HMO

## 2023-10-03 ENCOUNTER — Telehealth: Payer: Self-pay | Admitting: Family Medicine

## 2023-10-03 NOTE — Telephone Encounter (Signed)
 Copied from CRM [id number]. Topic: General - Other >> Oct 03, 2023  1:08 PM Priscille Loveless wrote: Reason for CRM: pts daughter called and asked for a note to give to the post office so that the carrier can deliver to a mail box at her front door. She is elderly and is afraid of her falling at the mailbox and getting hurt at the road. Please call and advise either way.   [place], [phone]

## 2023-10-03 NOTE — Telephone Encounter (Signed)
 Are you able to send a letter for pt? Pt needs accomodation letter from provider to give to mail services so pt does not have to walk too far off from her home door to pick up home mailed letters. Pt daughter is concerned about pt having a fall. Mailbox is across the street from her home. The mail services can put it on the door if pt can get a medical note from provider.

## 2023-10-07 NOTE — Telephone Encounter (Signed)
 Daughter Herbert Seta calling to follow up on request for letter concerning to have her mailbox moved to her door.  Family is concerned she may fall going to the mailbox at the road.

## 2023-10-07 NOTE — Telephone Encounter (Signed)
 Copied from CRM [id number]. Topic: Referral - Request for Referral >> Oct 07, 2023 11:18 AM Payton Doughty wrote: Did the patient discuss referral with their provider in the last year? Yes (If No - schedule appointment) (If Yes - send message)  Appointment offered? No  Type of order/referral and detailed reason for visit: family is requesting home health aid  Preference of office, provider, location: Any  If referral order, have you been seen by this specialty before? No (If Yes, this issue or another issue? When? Where?  Can we respond through MyChart? No call daughter Herbert Seta at [phone] She said her has multiple Medical illustrator and thought we should know.

## 2023-10-08 ENCOUNTER — Telehealth: Payer: Self-pay | Admitting: Emergency Medicine

## 2023-10-08 NOTE — Telephone Encounter (Signed)
 Copied from CRM [id number]. Topic: General - Other >> Oct 03, 2023  1:08 PM Priscille Loveless wrote: Reason for CRM: pts daughter called and asked for a note to give to the post office so that the carrier can deliver to a mail box at her front door. She is elderly and is afraid of her falling at the mailbox and getting hurt at the road. Please call and advise either way.   [place], [phone]

## 2023-10-17 ENCOUNTER — Ambulatory Visit
Admission: RE | Admit: 2023-10-17 | Discharge: 2023-10-17 | Disposition: A | Discharge status: Home / Self Care | Source: Ambulatory Visit | Attending: Family Medicine | Admitting: Family Medicine

## 2023-10-17 DIAGNOSIS — Z1231 Encounter for screening mammogram for malignant neoplasm of breast: Secondary | ICD-10-CM | POA: Diagnosis not present

## 2023-10-17 NOTE — Telephone Encounter (Signed)
 Copied from CRM [id number]. Topic: General - Other >> Oct 17, 2023  8:55 AM Higinio Roger wrote: Reason for CRM: Patient lost wallet and would like a copy of her [facility] Medicare card. Please print out. She will be by to pick it up today before her appointment at 3p. Callback #: [phone]

## 2023-10-17 NOTE — Telephone Encounter (Signed)
 Copied from CRM [id number]. Topic: General - Other >> Oct 17, 2023  9:00 AM Jenny Cardenas wrote: Reason for CRM: Pt asking if her insurance cards, and info can be printed out today. She'll like to pick it up around 2pm. She need it for her breast exam.  Best call back [phone]

## 2023-10-22 NOTE — Telephone Encounter (Signed)
 Daughter is calling again to get some information on if referral has been placed and information about order

## 2023-10-29 NOTE — Telephone Encounter (Signed)
 Can you advise patients daughter is wanting information about getting mailbox  moved patient stated they need a letter from provider stating patient will need it moved due to physical demands. Also spoke with patients daughter about home health aide she is aware that moms medicare will not cover this so she will find somewhere herself.

## 2023-10-30 ENCOUNTER — Encounter: Payer: Self-pay | Admitting: Family Medicine

## 2023-11-06 NOTE — Telephone Encounter (Signed)
 Letter was sent to patient 10/30/23

## 2023-11-08 ENCOUNTER — Other Ambulatory Visit: Payer: Self-pay | Admitting: Family Medicine

## 2023-12-09 ENCOUNTER — Other Ambulatory Visit: Payer: Self-pay | Admitting: Family Medicine

## 2023-12-09 DIAGNOSIS — E7800 Pure hypercholesterolemia, unspecified: Secondary | ICD-10-CM

## 2023-12-09 DIAGNOSIS — E042 Nontoxic multinodular goiter: Secondary | ICD-10-CM

## 2023-12-09 NOTE — Telephone Encounter (Signed)
 Copied from CRM [id number]. Topic: Clinical - Medication Refill >> Dec 09, 2023  9:18 AM Artemio Larry wrote: Most Recent Primary Care Visit:  Provider: Abraham Abo  Department[facility]  Visit Type: OFFICE VISIT  Date: 09/11/2023  Medication:  buPROPion  (WELLBUTRIN  XL) 300 MG 24 hr tablet  Pravastatin  levothyroxine     Has the patient contacted their pharmacy? Yes (Agent: If no, request that the patient contact the pharmacy for the refill. If patient does not wish to contact the pharmacy document the reason why and proceed with request.) (Agent: If yes, when and what did the pharmacy advise?)  Is this the correct pharmacy for this prescription? Yes If no, delete pharmacy and type the correct one.  This is the patient's preferred pharmacy:   [facility] - [place], [place] - [address] [place] [address] Phone: [phone] Fax: [phone]   Has the prescription been filled recently? Yes  Is the patient out of the medication? Yes  Has the patient been seen for an appointment in the last year OR does the patient have an upcoming appointment? Yes  Can we respond through MyChart? No  Agent: Please be advised that Rx refills may take up to 3 business days. We ask that you follow-up with your pharmacy.

## 2023-12-11 ENCOUNTER — Ambulatory Visit (INDEPENDENT_AMBULATORY_CARE_PROVIDER_SITE_OTHER): Payer: Medicare HMO | Admitting: Family Medicine

## 2023-12-11 VITALS — BP 130/75 | HR 88 | Wt 150.8 lb

## 2023-12-11 DIAGNOSIS — E1169 Type 2 diabetes mellitus with other specified complication: Secondary | ICD-10-CM

## 2023-12-11 DIAGNOSIS — E785 Hyperlipidemia, unspecified: Secondary | ICD-10-CM | POA: Diagnosis not present

## 2023-12-11 DIAGNOSIS — I10 Essential (primary) hypertension: Secondary | ICD-10-CM | POA: Diagnosis not present

## 2023-12-11 DIAGNOSIS — E039 Hypothyroidism, unspecified: Secondary | ICD-10-CM

## 2023-12-11 DIAGNOSIS — F32A Depression, unspecified: Secondary | ICD-10-CM

## 2023-12-11 DIAGNOSIS — E042 Nontoxic multinodular goiter: Secondary | ICD-10-CM | POA: Diagnosis not present

## 2023-12-11 DIAGNOSIS — F419 Anxiety disorder, unspecified: Secondary | ICD-10-CM

## 2023-12-11 DIAGNOSIS — Z794 Long term (current) use of insulin: Secondary | ICD-10-CM

## 2023-12-11 MED ORDER — LEVOTHYROXINE SODIUM 25 MCG PO TABS
25.0000 ug | ORAL_TABLET | Freq: Every day | ORAL | 0 refills | Status: DC
Start: 2023-12-11 — End: 2023-12-11

## 2023-12-11 MED ORDER — PRAVASTATIN SODIUM 10 MG PO TABS: 10.0000 mg | ORAL_TABLET | Freq: Every day | ORAL | 0 refills | Status: DC

## 2023-12-11 MED ORDER — LEVOTHYROXINE SODIUM 25 MCG PO TABS: 25.0000 ug | ORAL_TABLET | Freq: Every day | ORAL | 3 refills | Status: AC

## 2023-12-11 MED ORDER — LIDOCAINE 5 % EX PTCH: 1.0000 | MEDICATED_PATCH | CUTANEOUS | 3 refills | Status: AC

## 2023-12-11 MED ORDER — BUPROPION HCL ER (XL) 300 MG PO TB24: 300.0000 mg | ORAL_TABLET | Freq: Every day | ORAL | 0 refills | Status: DC

## 2023-12-11 MED ORDER — SERTRALINE HCL 100 MG PO TABS
100.0000 mg | ORAL_TABLET | Freq: Every day | ORAL | 1 refills | Status: DC
Start: 2023-12-11 — End: 2024-01-14

## 2023-12-11 NOTE — Telephone Encounter (Signed)
 Requested Prescriptions  Pending Prescriptions Disp Refills   buPROPion  (WELLBUTRIN  XL) 300 MG 24 hr tablet 90 tablet 0    Sig: Take 1 tablet (300 mg total) by mouth daily.     Psychiatry: Antidepressants - bupropion  Failed - 12/11/2023 10:24 AM      Failed - Cr in normal range and within 360 days    Creatinine  Date Value Ref Range Status  04/22/2023 1.11 (H) 0.44 - 1.00 mg/dL Final   Creat  Date Value Ref Range Status  10/26/2016 0.83 0.50 - 0.99 mg/dL Final    Comment:      For patients > or = 74 years of age: The upper reference limit for Creatinine is approximately 13% higher for people identified as African-American.      Creatinine, Urine  Date Value Ref Range Status  10/26/2016 CANCELED 20 - 320 mg/dL     Comment:    Test not performed, no urine was received.    Result canceled by the ancillary   10/26/2016 188 20 - 320 mg/dL Final         Passed - AST in normal range and within 360 days    AST  Date Value Ref Range Status  04/22/2023 18 15 - 41 U/L Final         Passed - ALT in normal range and within 360 days    ALT  Date Value Ref Range Status  04/22/2023 12 0 - 44 U/L Final         Passed - Completed PHQ-2 or PHQ-9 in the last 360 days      Passed - Last BP in normal range    BP Readings from Last 1 Encounters:  09/11/23 114/66         Passed - Valid encounter within last 6 months    Recent Outpatient Visits           3 months ago Essential hypertension   [place] [facility], MD   6 months ago Essential hypertension   [place] [facility] Ray Caffey, MD   9 months ago Type 2 diabetes mellitus with other specified complication, with long-term current use of insulin  [facility])   [place] [facility], MD   1 year ago Anxiety and depression   [place] [facility], MD   1 year ago Diabetes mellitus type 2 in obese  [facility])   [place] [facility], Ray Caffey, MD       Future Appointments             Today Abraham Abo, MD [facility]             levothyroxine  (SYNTHROID ) 25 MCG tablet 90 tablet 0    Sig: Take 1 tablet (25 mcg total) by mouth daily before breakfast.     Endocrinology:  Hypothyroid Agents Failed - 12/11/2023 10:24 AM      Failed - TSH in normal range and within 360 days    TSH  Date Value Ref Range Status  09/22/2022 1.762 0.350 - 4.500 uIU/mL Final    Comment:    Performed by a 3rd Generation assay with a functional sensitivity of <=0.01 uIU/mL. Performed at [facility], [address], [place], [place] [address]   10/26/2016 3.70 mIU/L Final    Comment:  Reference Range   > or = 20 Years  0.40-4.50   Pregnancy Range First trimester  0.26-2.66 Second trimester 0.55-2.73 Third trimester  0.43-2.91            Passed - Valid encounter within last 12 months    Recent Outpatient Visits           3 months ago Essential hypertension   [place] [facility], MD   6 months ago Essential hypertension   [place] [facility], Ray Caffey, MD   9 months ago Type 2 diabetes mellitus with other specified complication, with long-term current use of insulin  [facility])   [place] [facility], MD   1 year ago Anxiety and depression   [place] [facility], MD   1 year ago Diabetes mellitus type 2 in obese [facility])   [place] [facility], MD       Future Appointments             Today Abraham Abo, MD [facility]             pravastatin  (PRAVACHOL ) 10 MG tablet 90 tablet 0    Sig: Take 1 tablet (10 mg total) by mouth daily.     Cardiovascular:  Antilipid - Statins Failed - 12/11/2023 10:24 AM       Failed - Lipid Panel in normal range within the last 12 months    Cholesterol  Date Value Ref Range Status  07/21/2012 220 (H) 0 - 200 mg/dL Final    Comment:    ATP III Classification       Desirable:  < 200 mg/dL               Borderline High:  200 - 239 mg/dL          High:  > = 409 mg/dL   LDL Cholesterol  Date Value Ref Range Status  04/17/2011 59 0 - 99 mg/dL Final   Direct LDL  Date Value Ref Range Status  07/21/2012 139.6 mg/dL Final    Comment:    Optimal:  <100 mg/dLNear or Above Optimal:  100-129 mg/dLBorderline High:  130-159 mg/dLHigh:  160-189 mg/dLVery High:  >190 mg/dL   HDL  Date Value Ref Range Status  07/21/2012 27.70 (L) >39.00 mg/dL Final   Triglycerides  Date Value Ref Range Status  07/21/2012 350.0 (H) 0.0 - 149.0 mg/dL Final    Comment:    Normal:  <150 mg/dLBorderline High:  150 - 199 mg/dL         Passed - Patient is not pregnant      Passed - Valid encounter within last 12 months    Recent Outpatient Visits           3 months ago Essential hypertension   [place] [facility], MD   6 months ago Essential hypertension   [place] [facility], MD   9 months ago Type 2 diabetes mellitus with other specified complication, with long-term current use of insulin  [facility])   [place] [facility], MD   1 year ago Anxiety and depression   [place] [facility], MD   1 year ago Diabetes mellitus type 2 in obese [facility])  [place] [facility], MD       Future Appointments             Today Abraham Abo, MD [facility]

## 2023-12-12 ENCOUNTER — Telehealth: Payer: Self-pay

## 2023-12-12 NOTE — Telephone Encounter (Signed)
 Copied from CRM [id number]. Topic: Clinical - Lab/Test Results >> Dec 12, 2023 11:40 AM Alpha Arts wrote: Reason for CRM: Tonya from [facility] stated they received an extra sample for the patient and would like to know what it is for.  Callback #: [id number] [phone]

## 2023-12-13 LAB — CMP14+EGFR
ALT: 10 IU/L (ref 0–32)
AST: 13 IU/L (ref 0–40)
Albumin: 4.2 g/dL (ref 3.8–4.8)
Alkaline Phosphatase: 105 IU/L (ref 44–121)
BUN/Creatinine Ratio: 12 (ref 12–28)
BUN: 14 mg/dL (ref 8–27)
Bilirubin Total: 0.4 mg/dL (ref 0.0–1.2)
CO2: 25 mmol/L (ref 20–29)
Calcium: 9.3 mg/dL (ref 8.7–10.3)
Chloride: 102 mmol/L (ref 96–106)
Creatinine, Ser: 1.18 mg/dL — ABNORMAL HIGH (ref 0.57–1.00)
Globulin, Total: 2.6 g/dL (ref 1.5–4.5)
Glucose: 84 mg/dL (ref 70–99)
Potassium: 3.9 mmol/L (ref 3.5–5.2)
Sodium: 141 mmol/L (ref 134–144)
Total Protein: 6.8 g/dL (ref 6.0–8.5)
eGFR: 49 mL/min/{1.73_m2} — ABNORMAL LOW (ref >59)

## 2023-12-13 LAB — LIPID PANEL
Chol/HDL Ratio: 4.9 ratio — ABNORMAL HIGH (ref 0.0–4.4)
Cholesterol, Total: 204 mg/dL — ABNORMAL HIGH (ref 100–199)
HDL: 42 mg/dL (ref >39)
LDL Chol Calc (NIH): 133 mg/dL — ABNORMAL HIGH (ref 0–99)
Triglycerides: 162 mg/dL — ABNORMAL HIGH (ref 0–149)
VLDL Cholesterol Cal: 29 mg/dL (ref 5–40)

## 2023-12-13 LAB — SPECIMEN STATUS REPORT

## 2023-12-13 LAB — TSH+FREE T4
Free T4: 1.43 ng/dL (ref 0.82–1.77)
TSH: 2.54 u[IU]/mL (ref 0.450–4.500)

## 2023-12-13 NOTE — Telephone Encounter (Signed)
 Tonya from [employer] [employer] calling back about the extra sample, looking to speak to the CMA. Please follow up. Callback #: [id number] ext [phone]

## 2023-12-13 NOTE — Telephone Encounter (Signed)
 Is there any extra lab work you want done ?

## 2023-12-16 ENCOUNTER — Encounter: Payer: Self-pay | Admitting: Family Medicine

## 2023-12-16 NOTE — Telephone Encounter (Signed)
 Called Jenny Cardenas and she is aware

## 2023-12-16 NOTE — Progress Notes (Signed)
 Established Patient Office Visit  Subjective    Patient ID: Jenny Cardenas, female    DOB: 04/16/1951  Age: 73 y.o. MRN: [id number]  CC:  Chief Complaint  Patient presents with   Medical Management of Chronic Issues    Anxious feeling    Medication Refill    HPI Jenny Cardenas presents for routine follow up of chronic med issues. Patient denies acute complaints.   Outpatient Encounter Medications as of 12/11/2023  Medication Sig   albuterol  (PROVENTIL ) (2.5 MG/3ML) 0.083% nebulizer solution Take 3 mLs (2.5 mg total) by nebulization every 4 (four) hours as needed for wheezing or shortness of breath.   albuterol  (VENTOLIN  HFA) 108 (90 Base) MCG/ACT inhaler Inhale 1-2 puffs into the lungs every 6 (six) hours as needed for wheezing or shortness of breath. This is a one-time refill from me until the patient establish care with her primary care physician.  Please do not send request for refill to me in the future.   buPROPion  (WELLBUTRIN  XL) 300 MG 24 hr tablet Take 1 tablet (300 mg total) by mouth daily.   cyanocobalamin  (VITAMIN B12) 1000 MCG tablet Take 1 tablet (1,000 mcg total) by mouth daily.   fluocinonide cream (LIDEX) 0.05 % Apply 1 Application topically 2 (two) times daily.   ibuprofen  (ADVIL ) 200 MG tablet Take 200 mg by mouth as needed.   Ibuprofen -diphenhydrAMINE  Cit (MOTRIN  PM PO) Take 1 tablet by mouth at bedtime as needed.   levocetirizine (XYZAL ) 5 MG tablet Take 5 mg by mouth daily as needed for allergies.   Multiple Vitamins-Minerals (PRESERVISION AREDS 2 PO) Take 1 capsule by mouth in the morning and at bedtime.   pantoprazole  (PROTONIX ) 40 MG tablet Take 1 tablet (40 mg total) by mouth daily.   pravastatin  (PRAVACHOL ) 10 MG tablet Take 1 tablet (10 mg total) by mouth daily.   PRESCRIPTION MEDICATION Natrol memory complex daily   Probiotic Product (PROBIOTIC DAILY PO) Take by mouth daily.   rivastigmine  (EXELON ) 1.5 MG capsule Take 1 capsule (1.5 mg total) by mouth 2  (two) times daily.   traMADol  (ULTRAM ) 50 MG tablet Take 1 tablet (50 mg total) by mouth every 6 (six) hours as needed (mild pain).   traZODone  (DESYREL ) 100 MG tablet TAKE 1 TABLET AT BEDTIME   [DISCONTINUED] ALPRAZolam  (XANAX ) 0.5 MG tablet Take 1 tablet (0.5 mg total) by mouth 3 (three) times daily as needed for anxiety.   [DISCONTINUED] buPROPion  (WELLBUTRIN  XL) 150 MG 24 hr tablet Take 1 tablet (150 mg total) by mouth daily.   [DISCONTINUED] levothyroxine  (SYNTHROID ) 25 MCG tablet Take 1 tablet (25 mcg total) by mouth daily before breakfast.   [DISCONTINUED] lidocaine  (LIDODERM ) 5 % Place 1 patch onto the skin daily. Remove & Discard patch within 12 hours or as directed by MD   [DISCONTINUED] sertraline  (ZOLOFT ) 100 MG tablet Take 1 tablet (100 mg total) by mouth daily.   levothyroxine  (SYNTHROID ) 25 MCG tablet Take 1 tablet (25 mcg total) by mouth daily before breakfast.   lidocaine  (LIDODERM ) 5 % Place 1 patch onto the skin daily. Remove & Discard patch within 12 hours or as directed by MD   sertraline  (ZOLOFT ) 100 MG tablet Take 1 tablet (100 mg total) by mouth daily.   No facility-administered encounter medications on file as of 12/11/2023.    Past Medical History:  Diagnosis Date   ADHD (attention deficit hyperactivity disorder) 2020   ADD   Anxiety    Arthritis  Broken heart syndrome 2013   Carpal tunnel syndrome    Cataract    Chronic kidney disease    Depression    Diabetes mellitus without complication (HCC)    Type 2   Dysrhythmia 1976   tachycardia   Fall 04/10/2015   GERD (gastroesophageal reflux disease)    Goiter    History of shingles    On abdomen, left ring finger, and left leg; Pt takes Valtrex    Hyperlipidemia    Hypertension    Hypothyroidism    lung ca 04/2021   Obesity    PTSD (post-traumatic stress disorder)    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   Shortness of breath dyspnea    pt stated its related to the back problems she has   Thyroid   disease    Ulcer     Past Surgical History:  Procedure Laterality Date   ABDOMINAL HYSTERECTOMY  1996   BRONCHIAL BRUSHINGS  06/29/2021   Procedure: BRONCHIAL BRUSHINGS;  Surgeon: Denson Flake, MD;  Location: [facility] ENDOSCOPY;  Service: Pulmonary;;   BRONCHIAL NEEDLE ASPIRATION BIOPSY  06/29/2021   Procedure: BRONCHIAL NEEDLE ASPIRATION BIOPSIES;  Surgeon: Denson Flake, MD;  Location: [facility] ENDOSCOPY;  Service: Pulmonary;;   CATARACT EXTRACTION, BILATERAL     CESAREAN SECTION  1976, 1983, 1984   COLONOSCOPY W/ POLYPECTOMY     EYE SURGERY Bilateral 2014   cataratact and lasik surgery   FIDUCIAL MARKER PLACEMENT  06/29/2021   Procedure: FIDUCIAL DYE MARKING;  Surgeon: Denson Flake, MD;  Location: [facility] ENDOSCOPY;  Service: Pulmonary;;   INTERCOSTAL NERVE BLOCK  06/29/2021   Procedure: INTERCOSTAL NERVE BLOCK;  Surgeon: Hilarie Lovely, MD;  Location: [facility] OR;  Service: Thoracic;;   KYPHOPLASTY N/A 06/02/2015   Procedure: KYPHOPLASTY;  Surgeon: Virl Grimes, MD;  Location: [facility] OR;  Service: Orthopedics;  Laterality: N/A;  Thoracic 3, 8, 10 kyphoplasty   LOBECTOMY  06/29/2021   Procedure: RIGHT UPPER LOBECTOMY;  Surgeon: Hilarie Lovely, MD;  Location: [facility] OR;  Service: Thoracic;;   LYMPH NODE DISSECTION  06/29/2021   Procedure: LYMPH NODE DISSECTION;  Surgeon: Hilarie Lovely, MD;  Location: [facility] OR;  Service: Thoracic;;    Family History  Problem Relation Age of Onset   COPD Mother    Depression Mother    Hypertension Mother    Hyperlipidemia Mother    Breast cancer Mother 66   Diverticulosis Mother    Heart disease Father    Dementia Father    Breast cancer Sister 83   Breast cancer Sister 36 - 12   Non-Hodgkin's lymphoma Sister    Colon cancer Neg Hx     Social History   Socioeconomic History   Marital status: Widowed    Spouse name: Not on file   Number of children: 2   Years of education: Not on file   Highest education level: Master's degree (e.g., MA,  MS, MEng, MEd, MSW, MBA)  Occupational History   Not on file  Tobacco Use   Smoking status: Former    Current packs/day: 0.00    Average packs/day: 0.5 packs/day for 4.0 years (2.0 ttl pk-yrs)    Types: Cigarettes    Start date: 06/28/2012    Quit date: 06/28/2016    Years since quitting: 7.4   Smokeless tobacco: Never  Vaping Use   Vaping status: Never Used  Substance and Sexual Activity   Alcohol  use: Not Currently    Comment: rare glass of wine (  holidays only)   Drug use: Never   Sexual activity: Not Currently  Other Topics Concern   Not on file  Social History Narrative   Not on file   Social Drivers of Health   Financial Resource Strain: Low Risk  (06/04/2023)   Overall Financial Resource Strain (CARDIA)    Difficulty of Paying Living Expenses: Not hard at all  Food Insecurity: No Food Insecurity (06/04/2023)   Hunger Vital Sign    Worried About Running Out of Food in the Last Year: Never true    Ran Out of Food in the Last Year: Never true  Transportation Needs: No Transportation Needs (06/04/2023)   PRAPARE - Administrator, Civil Service (Medical): No    Lack of Transportation (Non-Medical): No  Physical Activity: Sufficiently Active (06/04/2023)   Exercise Vital Sign    Days of Exercise per Week: 6 days    Minutes of Exercise per Session: 60 min  Recent Concern: Physical Activity - Inactive (06/04/2023)   Exercise Vital Sign    Days of Exercise per Week: 0 days    Minutes of Exercise per Session: 0 min  Stress: No Stress Concern Present (06/04/2023)   [employer] of Occupational Health - Occupational Stress Questionnaire    Feeling of Stress : Only a little  Recent Concern: Stress - Stress Concern Present (06/04/2023)   [employer] of Occupational Health - Occupational Stress Questionnaire    Feeling of Stress : To some extent  Social Connections: Moderately Isolated (06/04/2023)   Social Connection and Isolation Panel [NHANES]     Frequency of Communication with Friends and Family: More than three times a week    Frequency of Social Gatherings with Friends and Family: More than three times a week    Attends Religious Services: More than 4 times per year    Active Member of [employer] or Organizations: No    Attends Banker Meetings: Never    Marital Status: Widowed  Intimate Partner Violence: Not At Risk (06/04/2023)   Humiliation, Afraid, Rape, and Kick questionnaire    Fear of Current or Ex-Partner: No    Emotionally Abused: No    Physically Abused: No    Sexually Abused: No    Review of Systems  All other systems reviewed and are negative.       Objective    BP 130/75 (BP Location: Right Arm, Patient Position: Sitting, Cuff Size: Normal)   Pulse 88   Wt 150 lb 12.8 oz (68.4 kg)   SpO2 98%   BMI 29.45 kg/m   Physical Exam Vitals and nursing note reviewed.  Constitutional:      General: She is not in acute distress. Cardiovascular:     Rate and Rhythm: Normal rate and regular rhythm.  Pulmonary:     Effort: Pulmonary effort is normal.     Breath sounds: Normal breath sounds.  Abdominal:     Palpations: Abdomen is soft.     Tenderness: There is no abdominal tenderness.  Neurological:     General: No focal deficit present.     Mental Status: She is alert and oriented to person, place, and time.  Psychiatric:        Mood and Affect: Mood normal.        Behavior: Behavior normal.         Assessment & Plan:   Type 2 diabetes mellitus with other specified complication, with long-term current use of insulin  (HCC) -  CMP14+EGFR  Anxiety and depression -     Sertraline  HCl; Take 1 tablet (100 mg total) by mouth daily.  Dispense: 90 tablet; Refill: 1  Multinodular goiter -     Levothyroxine  Sodium; Take 1 tablet (25 mcg total) by mouth daily before breakfast.  Dispense: 90 tablet; Refill: 3  Essential hypertension  Hypothyroidism, unspecified type -     TSH + free  T4  Hyperlipidemia, unspecified hyperlipidemia type -     Lipid panel  Other orders -     Lidocaine ; Place 1 patch onto the skin daily. Remove & Discard patch within 12 hours or as directed by MD  Dispense: 90 patch; Refill: 3 -     Specimen status report     Return in about 3 months (around 03/11/2024) for follow up, chronic med issues.   Arlo Lama, MD

## 2023-12-29 ENCOUNTER — Emergency Department (HOSPITAL_COMMUNITY)

## 2023-12-29 ENCOUNTER — Other Ambulatory Visit: Payer: Self-pay

## 2023-12-29 ENCOUNTER — Encounter (HOSPITAL_COMMUNITY): Payer: Self-pay | Admitting: *Deleted

## 2023-12-29 ENCOUNTER — Observation Stay (HOSPITAL_COMMUNITY)
Admission: EM | Admit: 2023-12-29 | Discharge: 2023-12-30 | Disposition: A | Discharge status: Home / Self Care | Attending: Internal Medicine | Admitting: Internal Medicine

## 2023-12-29 DIAGNOSIS — F0390 Unspecified dementia without behavioral disturbance: Secondary | ICD-10-CM | POA: Diagnosis not present

## 2023-12-29 DIAGNOSIS — I471 Supraventricular tachycardia, unspecified: Secondary | ICD-10-CM | POA: Diagnosis not present

## 2023-12-29 DIAGNOSIS — E7849 Other hyperlipidemia: Secondary | ICD-10-CM

## 2023-12-29 DIAGNOSIS — Z743 Need for continuous supervision: Secondary | ICD-10-CM | POA: Diagnosis not present

## 2023-12-29 DIAGNOSIS — R55 Syncope and collapse: Secondary | ICD-10-CM | POA: Insufficient documentation

## 2023-12-29 DIAGNOSIS — R0789 Other chest pain: Secondary | ICD-10-CM | POA: Diagnosis present

## 2023-12-29 DIAGNOSIS — E039 Hypothyroidism, unspecified: Secondary | ICD-10-CM | POA: Insufficient documentation

## 2023-12-29 DIAGNOSIS — R7989 Other specified abnormal findings of blood chemistry: Secondary | ICD-10-CM

## 2023-12-29 DIAGNOSIS — I10 Essential (primary) hypertension: Secondary | ICD-10-CM | POA: Diagnosis not present

## 2023-12-29 DIAGNOSIS — F32A Depression, unspecified: Secondary | ICD-10-CM | POA: Insufficient documentation

## 2023-12-29 DIAGNOSIS — I214 Non-ST elevation (NSTEMI) myocardial infarction: Secondary | ICD-10-CM | POA: Insufficient documentation

## 2023-12-29 DIAGNOSIS — R0602 Shortness of breath: Secondary | ICD-10-CM | POA: Diagnosis not present

## 2023-12-29 DIAGNOSIS — N189 Chronic kidney disease, unspecified: Secondary | ICD-10-CM | POA: Insufficient documentation

## 2023-12-29 DIAGNOSIS — I129 Hypertensive chronic kidney disease with stage 1 through stage 4 chronic kidney disease, or unspecified chronic kidney disease: Secondary | ICD-10-CM | POA: Diagnosis not present

## 2023-12-29 DIAGNOSIS — Z85118 Personal history of other malignant neoplasm of bronchus and lung: Secondary | ICD-10-CM | POA: Insufficient documentation

## 2023-12-29 DIAGNOSIS — E785 Hyperlipidemia, unspecified: Secondary | ICD-10-CM | POA: Insufficient documentation

## 2023-12-29 DIAGNOSIS — Z87891 Personal history of nicotine dependence: Secondary | ICD-10-CM | POA: Insufficient documentation

## 2023-12-29 DIAGNOSIS — I499 Cardiac arrhythmia, unspecified: Secondary | ICD-10-CM | POA: Diagnosis not present

## 2023-12-29 DIAGNOSIS — I2489 Other forms of acute ischemic heart disease: Secondary | ICD-10-CM | POA: Diagnosis not present

## 2023-12-29 DIAGNOSIS — Z9889 Other specified postprocedural states: Secondary | ICD-10-CM | POA: Diagnosis not present

## 2023-12-29 DIAGNOSIS — E1122 Type 2 diabetes mellitus with diabetic chronic kidney disease: Secondary | ICD-10-CM | POA: Insufficient documentation

## 2023-12-29 DIAGNOSIS — R6889 Other general symptoms and signs: Secondary | ICD-10-CM | POA: Diagnosis not present

## 2023-12-29 DIAGNOSIS — R778 Other specified abnormalities of plasma proteins: Secondary | ICD-10-CM | POA: Diagnosis not present

## 2023-12-29 DIAGNOSIS — Z7982 Long term (current) use of aspirin: Secondary | ICD-10-CM | POA: Diagnosis not present

## 2023-12-29 DIAGNOSIS — R0989 Other specified symptoms and signs involving the circulatory and respiratory systems: Secondary | ICD-10-CM | POA: Diagnosis not present

## 2023-12-29 HISTORY — DX: Unspecified dementia, unspecified severity, without behavioral disturbance, psychotic disturbance, mood disturbance, and anxiety: F03.90

## 2023-12-29 LAB — COMPREHENSIVE METABOLIC PANEL WITH GFR
ALT: 8 U/L (ref 0–44)
AST: 19 U/L (ref 15–41)
Albumin: 3.3 g/dL — ABNORMAL LOW (ref 3.5–5.0)
Alkaline Phosphatase: 66 U/L (ref 38–126)
Anion gap: 12 (ref 5–15)
BUN: 11 mg/dL (ref 8–23)
CO2: 23 mmol/L (ref 22–32)
Calcium: 8.6 mg/dL — ABNORMAL LOW (ref 8.9–10.3)
Chloride: 104 mmol/L (ref 98–111)
Creatinine, Ser: 1.29 mg/dL — ABNORMAL HIGH (ref 0.44–1.00)
GFR, Estimated: 44 mL/min — ABNORMAL LOW (ref >60)
Glucose, Bld: 202 mg/dL — ABNORMAL HIGH (ref 70–99)
Potassium: 3.7 mmol/L (ref 3.5–5.1)
Sodium: 139 mmol/L (ref 135–145)
Total Bilirubin: 0.7 mg/dL (ref 0.0–1.2)
Total Protein: 6.2 g/dL — ABNORMAL LOW (ref 6.5–8.1)

## 2023-12-29 LAB — CBC WITH DIFFERENTIAL/PLATELET
Abs Immature Granulocytes: 0.01 10*3/uL (ref 0.00–0.07)
Basophils Absolute: 0 10*3/uL (ref 0.0–0.1)
Basophils Relative: 0 %
Eosinophils Absolute: 0.1 10*3/uL (ref 0.0–0.5)
Eosinophils Relative: 1 %
HCT: 39.4 % (ref 36.0–46.0)
Hemoglobin: 12.4 g/dL (ref 12.0–15.0)
Immature Granulocytes: 0 %
Lymphocytes Relative: 11 %
Lymphs Abs: 0.8 10*3/uL (ref 0.7–4.0)
MCH: 28.2 pg (ref 26.0–34.0)
MCHC: 31.5 g/dL (ref 30.0–36.0)
MCV: 89.7 fL (ref 80.0–100.0)
Monocytes Absolute: 0.3 10*3/uL (ref 0.1–1.0)
Monocytes Relative: 5 %
Neutro Abs: 6.2 10*3/uL (ref 1.7–7.7)
Neutrophils Relative %: 83 %
Platelets: 252 10*3/uL (ref 150–400)
RBC: 4.39 MIL/uL (ref 3.87–5.11)
RDW: 12.8 % (ref 11.5–15.5)
WBC: 7.5 10*3/uL (ref 4.0–10.5)
nRBC: 0 % (ref 0.0–0.2)

## 2023-12-29 LAB — CBC
HCT: 37.2 % (ref 36.0–46.0)
Hemoglobin: 11.9 g/dL — ABNORMAL LOW (ref 12.0–15.0)
MCH: 28.3 pg (ref 26.0–34.0)
MCHC: 32 g/dL (ref 30.0–36.0)
MCV: 88.6 fL (ref 80.0–100.0)
Platelets: 261 10*3/uL (ref 150–400)
RBC: 4.2 MIL/uL (ref 3.87–5.11)
RDW: 12.9 % (ref 11.5–15.5)
WBC: 9.2 10*3/uL (ref 4.0–10.5)
nRBC: 0 % (ref 0.0–0.2)

## 2023-12-29 LAB — MAGNESIUM: Magnesium: 2 mg/dL (ref 1.7–2.4)

## 2023-12-29 LAB — TROPONIN I (HIGH SENSITIVITY)
Troponin I (High Sensitivity): 34 ng/L — ABNORMAL HIGH (ref <18)
Troponin I (High Sensitivity): 153 ng/L (ref <18)
Troponin I (High Sensitivity): 209 ng/L (ref <18)
Troponin I (High Sensitivity): 245 ng/L (ref <18)

## 2023-12-29 LAB — I-STAT VENOUS BLOOD GAS, ED
Acid-base deficit: 2 mmol/L (ref 0.0–2.0)
Bicarbonate: 23.7 mmol/L (ref 20.0–28.0)
Calcium, Ion: 1.14 mmol/L — ABNORMAL LOW (ref 1.15–1.40)
HCT: 38 % (ref 36.0–46.0)
Hemoglobin: 12.9 g/dL (ref 12.0–15.0)
O2 Saturation: 70 %
Potassium: 3.7 mmol/L (ref 3.5–5.1)
Sodium: 142 mmol/L (ref 135–145)
TCO2: 25 mmol/L (ref 22–32)
pCO2, Ven: 42.3 mmHg — ABNORMAL LOW (ref 44–60)
pH, Ven: 7.356 (ref 7.25–7.43)
pO2, Ven: 38 mmHg (ref 32–45)

## 2023-12-29 LAB — CREATININE, SERUM
Creatinine, Ser: 1.07 mg/dL — ABNORMAL HIGH (ref 0.44–1.00)
GFR, Estimated: 55 mL/min — ABNORMAL LOW (ref >60)

## 2023-12-29 LAB — D-DIMER, QUANTITATIVE: D-Dimer, Quant: 0.49 ug{FEU}/mL (ref 0.00–0.50)

## 2023-12-29 MED ORDER — METOPROLOL TARTRATE 12.5 MG HALF TABLET
12.5000 mg | ORAL_TABLET | Freq: Two times a day (BID) | ORAL | Status: DC
  Administered 2023-12-29 – 2023-12-30 (×2): 12.5 mg via ORAL
  Filled 2023-12-29 (×2): qty 1

## 2023-12-29 MED ORDER — ENOXAPARIN SODIUM 40 MG/0.4ML IJ SOSY
40.0000 mg | PREFILLED_SYRINGE | INTRAMUSCULAR | Status: DC
  Administered 2023-12-29: 40 mg via SUBCUTANEOUS
  Filled 2023-12-29: qty 0.4

## 2023-12-29 MED ORDER — RIVASTIGMINE TARTRATE 1.5 MG PO CAPS
1.5000 mg | ORAL_CAPSULE | Freq: Two times a day (BID) | ORAL | Status: DC
  Administered 2023-12-29 – 2023-12-30 (×2): 1.5 mg via ORAL
  Filled 2023-12-29 (×3): qty 1

## 2023-12-29 MED ORDER — SERTRALINE HCL 100 MG PO TABS
100.0000 mg | ORAL_TABLET | Freq: Every day | ORAL | Status: DC
  Administered 2023-12-29 – 2023-12-30 (×2): 100 mg via ORAL
  Filled 2023-12-29 (×2): qty 1

## 2023-12-29 MED ORDER — LEVOTHYROXINE SODIUM 25 MCG PO TABS
25.0000 ug | ORAL_TABLET | Freq: Every day | ORAL | Status: DC
  Administered 2023-12-30: 25 ug via ORAL
  Filled 2023-12-29: qty 1

## 2023-12-29 MED ORDER — PANTOPRAZOLE SODIUM 40 MG PO TBEC
40.0000 mg | DELAYED_RELEASE_TABLET | Freq: Every day | ORAL | Status: DC
  Administered 2023-12-30: 40 mg via ORAL
  Filled 2023-12-29: qty 1

## 2023-12-29 MED ORDER — SODIUM CHLORIDE 0.9 % IV BOLUS
1000.0000 mL | Freq: Once | INTRAVENOUS | Status: AC
  Administered 2023-12-29: 1000 mL via INTRAVENOUS

## 2023-12-29 MED ORDER — ASPIRIN 325 MG PO TABS
325.0000 mg | ORAL_TABLET | Freq: Every day | ORAL | Status: DC
  Administered 2023-12-29 – 2023-12-30 (×2): 325 mg via ORAL
  Filled 2023-12-29 (×2): qty 1

## 2023-12-29 MED ORDER — ORAL CARE MOUTH RINSE: 15.0000 mL | OROMUCOSAL | Status: DC | PRN

## 2023-12-29 MED ORDER — ACETAMINOPHEN 325 MG PO TABS
975.0000 mg | ORAL_TABLET | Freq: Four times a day (QID) | ORAL | Status: DC | PRN
  Administered 2023-12-29: 975 mg via ORAL
  Filled 2023-12-29: qty 3

## 2023-12-29 MED ORDER — MELATONIN 3 MG PO TABS
3.0000 mg | ORAL_TABLET | Freq: Every evening | ORAL | Status: DC | PRN
  Administered 2023-12-29: 3 mg via ORAL
  Filled 2023-12-29: qty 1

## 2023-12-29 MED ORDER — PRAVASTATIN SODIUM 10 MG PO TABS
10.0000 mg | ORAL_TABLET | Freq: Every day | ORAL | Status: DC
  Administered 2023-12-29 – 2023-12-30 (×2): 10 mg via ORAL
  Filled 2023-12-29 (×2): qty 1

## 2023-12-29 MED ORDER — BUPROPION HCL ER (XL) 150 MG PO TB24
300.0000 mg | ORAL_TABLET | Freq: Every day | ORAL | Status: DC
  Administered 2023-12-30: 300 mg via ORAL
  Filled 2023-12-29: qty 2

## 2023-12-29 NOTE — Consult Note (Signed)
 CARDIOLOGY CONSULT NOTE    Patient ID: Jenny Cardenas; [id number]; [birth year]   Admit date: 12/29/2023 Date of Consult: 12/29/2023  Primary Care Provider: Abraham Abo, MD Primary Cardiologist:  Primary Electrophysiologist:     History of Present Illness:   Jenny Cardenas is a 73 year old F known to have DM 2, CKD, questionable dementia presented to ER with SVT.  She was cleaning bathroom this afternoon when she suddenly felt dizzy, had headaches, chest pressure and thumping in her bilateral neck.  She thought it was coming from her lungs.  No palpitations per se.  Her family called 911, EMS arrived and she was noted to be in new onset SVT with HR 230s.  IV adenosine was administered, successfully converted to NSR, with HR 110 bpm.  Currently in NSR on telemetry.  Asymptomatic after arrival to the ER.  Past Medical History:  Diagnosis Date   ADHD (attention deficit hyperactivity disorder) 2020   ADD   Anxiety    Arthritis    Broken heart syndrome 2013   Carpal tunnel syndrome    Cataract    Chronic kidney disease    Dementia (HCC)    Depression    Diabetes mellitus without complication (HCC)    Type 2   Dysrhythmia 1976   tachycardia   Fall 04/10/2015   GERD (gastroesophageal reflux disease)    Goiter    History of shingles    On abdomen, left ring finger, and left leg; Pt takes Valtrex    Hyperlipidemia    Hypertension    Hypothyroidism    lung ca 04/2021   Obesity    PTSD (post-traumatic stress disorder)    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   Shortness of breath dyspnea    pt stated its related to the back problems she has   Thyroid  disease    Ulcer     Past Surgical History:  Procedure Laterality Date   ABDOMINAL HYSTERECTOMY  1996   BRONCHIAL BRUSHINGS  06/29/2021   Procedure: BRONCHIAL BRUSHINGS;  Surgeon: Denson Flake, MD;  Location: [facility] ENDOSCOPY;  Service: Pulmonary;;   BRONCHIAL NEEDLE ASPIRATION BIOPSY  06/29/2021   Procedure: BRONCHIAL NEEDLE  ASPIRATION BIOPSIES;  Surgeon: Denson Flake, MD;  Location: [facility] ENDOSCOPY;  Service: Pulmonary;;   CATARACT EXTRACTION, BILATERAL     CESAREAN SECTION  1976, 1983, 1984   COLONOSCOPY W/ POLYPECTOMY     EYE SURGERY Bilateral 2014   cataratact and lasik surgery   FIDUCIAL MARKER PLACEMENT  06/29/2021   Procedure: FIDUCIAL DYE MARKING;  Surgeon: Denson Flake, MD;  Location: [facility] ENDOSCOPY;  Service: Pulmonary;;   INTERCOSTAL NERVE BLOCK  06/29/2021   Procedure: INTERCOSTAL NERVE BLOCK;  Surgeon: Hilarie Lovely, MD;  Location: [facility] OR;  Service: Thoracic;;   KYPHOPLASTY N/A 06/02/2015   Procedure: KYPHOPLASTY;  Surgeon: Virl Grimes, MD;  Location: [facility] OR;  Service: Orthopedics;  Laterality: N/A;  Thoracic 3, 8, 10 kyphoplasty   LOBECTOMY  06/29/2021   Procedure: RIGHT UPPER LOBECTOMY;  Surgeon: Hilarie Lovely, MD;  Location: [facility] OR;  Service: Thoracic;;   LYMPH NODE DISSECTION  06/29/2021   Procedure: LYMPH NODE DISSECTION;  Surgeon: Hilarie Lovely, MD;  Location: [facility] OR;  Service: Thoracic;;       Inpatient Medications: Scheduled Meds:  aspirin  325 mg Oral Daily   enoxaparin  (LOVENOX ) injection  40 mg Subcutaneous Q24H   Continuous Infusions:  PRN Meds:   Allergies:    Allergies  Allergen  Reactions   Ethylene Oxide Hives, Itching and Other (See Comments)    Other reaction(s): scarred cornea, temporary blindness    Social History:   Social History   Socioeconomic History   Marital status: Widowed    Spouse name: Not on file   Number of children: 2   Years of education: Not on file   Highest education level: Master's degree (e.g., MA, MS, MEng, MEd, MSW, MBA)  Occupational History   Not on file  Tobacco Use   Smoking status: Former    Current packs/day: 0.00    Average packs/day: 0.5 packs/day for 4.0 years (2.0 ttl pk-yrs)    Types: Cigarettes    Start date: 06/28/2012    Quit date: 06/28/2016    Years since quitting: 7.5   Smokeless tobacco:  Never  Vaping Use   Vaping status: Never Used  Substance and Sexual Activity   Alcohol  use: Not Currently    Comment: rare glass of wine (holidays only)   Drug use: Never   Sexual activity: Not Currently  Other Topics Concern   Not on file  Social History Narrative   Not on file   Social Drivers of Health   Financial Resource Strain: Low Risk  (06/04/2023)   Overall Financial Resource Strain (CARDIA)    Difficulty of Paying Living Expenses: Not hard at all  Food Insecurity: No Food Insecurity (06/04/2023)   Hunger Vital Sign    Worried About Running Out of Food in the Last Year: Never true    Ran Out of Food in the Last Year: Never true  Transportation Needs: No Transportation Needs (06/04/2023)   PRAPARE - Administrator, Civil Service (Medical): No    Lack of Transportation (Non-Medical): No  Physical Activity: Sufficiently Active (06/04/2023)   Exercise Vital Sign    Days of Exercise per Week: 6 days    Minutes of Exercise per Session: 60 min  Recent Concern: Physical Activity - Inactive (06/04/2023)   Exercise Vital Sign    Days of Exercise per Week: 0 days    Minutes of Exercise per Session: 0 min  Stress: No Stress Concern Present (06/04/2023)   [employer] of Occupational Health - Occupational Stress Questionnaire    Feeling of Stress : Only a little  Recent Concern: Stress - Stress Concern Present (06/04/2023)   [employer] of Occupational Health - Occupational Stress Questionnaire    Feeling of Stress : To some extent  Social Connections: Moderately Isolated (06/04/2023)   Social Connection and Isolation Panel [NHANES]    Frequency of Communication with Friends and Family: More than three times a week    Frequency of Social Gatherings with Friends and Family: More than three times a week    Attends Religious Services: More than 4 times per year    Active Member of [employer] or Organizations: No    Attends Banker Meetings: Never     Marital Status: Widowed  Intimate Partner Violence: Not At Risk (06/04/2023)   Humiliation, Afraid, Rape, and Kick questionnaire    Fear of Current or Ex-Partner: No    Emotionally Abused: No    Physically Abused: No    Sexually Abused: No    Family History:    Family History  Problem Relation Age of Onset   COPD Mother    Depression Mother    Hypertension Mother    Hyperlipidemia Mother    Breast cancer Mother 75   Diverticulosis Mother    Heart  disease Father    Dementia Father    Breast cancer Sister 43   Breast cancer Sister 84 - 7   Non-Hodgkin's lymphoma Sister    Colon cancer Neg Hx      ROS:  Please see the history of present illness.  ROS  All other ROS reviewed and negative.     Physical Exam/Data:   Vitals:   12/29/23 1315 12/29/23 1345 12/29/23 1400 12/29/23 1445  BP: 102/63 105/63 (!) 102/57 (!) 105/54  Pulse: 81 78 76 78  Resp: 16 17 15 10   Temp:      TempSrc:      SpO2: 99% 97% 98% 95%  Weight:      Height:       No intake or output data in the 24 hours ending 12/29/23 1635 Filed Weights   12/29/23 1144  Weight: 68.4 kg   Body mass index is 29.45 kg/m.  General:  Well nourished, well developed, in no acute distress HEENT: normal Lymph: no adenopathy Neck: no JVD Endocrine:  No thryomegaly Vascular: No carotid bruits; FA pulses 2+ bilaterally without bruits  Cardiac:  normal S1, S2; RRR; no murmur  Lungs:  clear to auscultation bilaterally, no wheezing, rhonchi or rales  Abd: soft, nontender, no hepatomegaly  Ext: no edema Musculoskeletal:  No deformities, BUE and BLE strength normal and equal Skin: warm and dry  Neuro:  CNs 2-12 intact, no focal abnormalities noted Psych:  Normal affect   EKG:  The EKG was personally reviewed and demonstrates:   Telemetry:  Telemetry was personally reviewed and demonstrates:    Relevant CV Studies:   Laboratory Data:  Chemistry Recent Labs  Lab 12/29/23 1152 12/29/23 1201  NA 139 142   K 3.7 3.7  CL 104  --   CO2 23  --   GLUCOSE 202*  --   BUN 11  --   CREATININE 1.29*  --   CALCIUM  8.6*  --   GFRNONAA 44*  --   ANIONGAP 12  --     Recent Labs  Lab 12/29/23 1152  PROT 6.2*  ALBUMIN 3.3*  AST 19  ALT 8  ALKPHOS 66  BILITOT 0.7   Hematology Recent Labs  Lab 12/29/23 1152 12/29/23 1201  WBC 7.5  --   RBC 4.39  --   HGB 12.4 12.9  HCT 39.4 38.0  MCV 89.7  --   MCH 28.2  --   MCHC 31.5  --   RDW 12.8  --   PLT 252  --    Cardiac EnzymesNo results for input(s): "TROPONINI" in the last 168 hours. No results for input(s): "TROPIPOC" in the last 168 hours.  BNPNo results for input(s): "BNP", "PROBNP" in the last 168 hours.  DDimer  Recent Labs  Lab 12/29/23 1152  DDIMER 0.49    Radiology/Studies:  DG Chest Portable 1 View Result Date: 12/29/2023 CLINICAL DATA:  Shortness of breath. EXAM: PORTABLE CHEST 1 VIEW COMPARISON:  None Available. FINDINGS: The heart size and mediastinal contours are within normal limits. Low lung volumes are noted. Both lungs are clear. Multiple prior thoracic vertebroplasties are seen. IMPRESSION: Low lung volumes. No active disease. Electronically Signed   By: Marlyce Sine M.D.   On: 12/29/2023 12:25    Assessment and Plan:   SVT, adenosine responsive: Symptomatic, successful conversion to NSR with 1 dose of IV adenosine.  No symptoms of dizziness, chest pain, headaches and thumping in her neck after she converted.  Currently in  normal sinus rhythm.  Never had any prior events.  Start metoprolol tartrate 25 mg twice daily.  Obtain echocardiogram.  She is also going through a lot of stress, taking care of her 64 year old mother who was sick recently.  HLD, unknown values: Continue pravastatin  10 mg nightly.  Can follow with PCP.   For questions or updates, please contact [facility] Please consult [email or web address] for contact info under Cardiology/STEMI.   Signed, Jeydan Barner Priya Nattaly Yebra, MD 12/29/2023 4:35 PM

## 2023-12-29 NOTE — ED Notes (Signed)
 RT paged to collect Coox ordered as an ABG.

## 2023-12-29 NOTE — ED Triage Notes (Addendum)
 Pt states she was cleaning her toilet per norm and began feeling light-headed and sob.  Called 911 and was thought to be a carbon manoxide poisoning.  However, when [facility] arrived, pt was found to be in new onset SVT.  Given 6 mg adenosine with conversion from 230 to 110. Given 300 ns.  AO x 4.   118/64 Cbg 248 Hr 110   Pt also states fall over recliner 2 days prior

## 2023-12-29 NOTE — ED Notes (Signed)
 MD notified of pt requesting home Wellbutrin  as well as a diet order.

## 2023-12-29 NOTE — ED Provider Notes (Signed)
 [place] EMERGENCY DEPARTMENT AT [facility] Provider Note   CSN: [id number] Arrival date & time: 12/29/23  1129     History  Chief Complaint  Patient presents with   svt    Jenny Cardenas is a 73 y.o. female.  HPI   31 old female presenting to the emergency department with a chief complaint of heart palpitations and chest pain in addition to lightheadedness and near syncope.  The history is provided by the patient who states that she was cleaning her bathroom, and thinks she was using bleach chemicals when she began to feel sudden onset shortness of breath with associated lightheadedness.  She barely made it to the couch had to steady herself on the wall to prevent herself from passing out.  On EMS arrival the patient was found to be in SVT with heart rates in the 230s, was administered 6 mg of adenosine with subsequent conversion to sinus tachycardia to 110.  The patient was administered a 300 cc normal saline bolus.  She states that when she was experiencing the symptoms.  She had developed onset of chest discomfort which has since resolved with improvement in her heart rate.  Home Medications Prior to Admission medications   Medication Sig Start Date End Date Taking? Authorizing Provider  albuterol  (PROVENTIL ) (2.5 MG/3ML) 0.083% nebulizer solution Take 3 mLs (2.5 mg total) by nebulization every 4 (four) hours as needed for wheezing or shortness of breath. 03/29/22   Fargo, Amy E, NP  albuterol  (VENTOLIN  HFA) 108 (90 Base) MCG/ACT inhaler Inhale 1-2 puffs into the lungs every 6 (six) hours as needed for wheezing or shortness of breath. This is a one-time refill from me until the patient establish care with her primary care physician.  Please do not send request for refill to me in the future. 10/03/22   Abraham Abo, MD  buPROPion  (WELLBUTRIN  XL) 300 MG 24 hr tablet Take 1 tablet (300 mg total) by mouth daily. 12/11/23   Abraham Abo, MD  cyanocobalamin  (VITAMIN B12) 1000  MCG tablet Take 1 tablet (1,000 mcg total) by mouth daily. 05/13/23   Camara, Amadou, MD  fluocinonide cream (LIDEX) 0.05 % Apply 1 Application topically 2 (two) times daily. 04/12/23   [provider]  ibuprofen  (ADVIL ) 200 MG tablet Take 200 mg by mouth as needed.    [provider]  Ibuprofen -diphenhydrAMINE  Cit (MOTRIN  PM PO) Take 1 tablet by mouth at bedtime as needed.    [provider]  levocetirizine (XYZAL ) 5 MG tablet Take 5 mg by mouth daily as needed for allergies.    [provider]  levothyroxine  (SYNTHROID ) 25 MCG tablet Take 1 tablet (25 mcg total) by mouth daily before breakfast. 12/11/23   Abraham Abo, MD  lidocaine  (LIDODERM ) 5 % Place 1 patch onto the skin daily. Remove & Discard patch within 12 hours or as directed by MD 12/11/23   Abraham Abo, MD  Multiple Vitamins-Minerals (PRESERVISION AREDS 2 PO) Take 1 capsule by mouth in the morning and at bedtime.    [provider]  pantoprazole  (PROTONIX ) 40 MG tablet Take 1 tablet (40 mg total) by mouth daily. 08/21/23   Abraham Abo, MD  pravastatin  (PRAVACHOL ) 10 MG tablet Take 1 tablet (10 mg total) by mouth daily. 12/11/23   Abraham Abo, MD  PRESCRIPTION MEDICATION Natrol memory complex daily    [provider]  Probiotic Product (PROBIOTIC DAILY PO) Take by mouth daily.    [provider]  rivastigmine  (EXELON )  1.5 MG capsule Take 1 capsule (1.5 mg total) by mouth 2 (two) times daily. 05/20/23 05/14/24  Camara, Amadou, MD  sertraline  (ZOLOFT ) 100 MG tablet Take 1 tablet (100 mg total) by mouth daily. 12/11/23   Abraham Abo, MD  traMADol  (ULTRAM ) 50 MG tablet Take 1 tablet (50 mg total) by mouth every 6 (six) hours as needed (mild pain). 10/03/22   Abraham Abo, MD  traZODone  (DESYREL ) 100 MG tablet TAKE 1 TABLET AT BEDTIME 11/14/23   Abraham Abo, MD      Allergies    Ethylene oxide and Other    Review of Systems   Review of Systems  Cardiovascular:   Positive for chest pain and palpitations.  Neurological:  Positive for light-headedness.  All other systems reviewed and are negative.   Physical Exam Updated Vital Signs BP (!) 105/54   Pulse 78   Temp 98.2 F (36.8 C) (Oral)   Resp 10   Ht 5' (1.524 m)   Wt 68.4 kg   SpO2 95%   BMI 29.45 kg/m  Physical Exam Vitals and nursing note reviewed.  Constitutional:      General: She is not in acute distress.    Appearance: She is well-developed.  HENT:     Head: Normocephalic and atraumatic.  Eyes:     Conjunctiva/sclera: Conjunctivae normal.  Cardiovascular:     Rate and Rhythm: Regular rhythm. Tachycardia present.     Pulses: Normal pulses.  Pulmonary:     Effort: Pulmonary effort is normal. No respiratory distress.     Breath sounds: Normal breath sounds.  Abdominal:     Palpations: Abdomen is soft.     Tenderness: There is no abdominal tenderness.  Musculoskeletal:        General: No swelling.     Cervical back: Neck supple.  Skin:    General: Skin is warm and dry.     Capillary Refill: Capillary refill takes less than 2 seconds.  Neurological:     General: No focal deficit present.     Mental Status: She is alert and oriented to person, place, and time. Mental status is at baseline.  Psychiatric:        Mood and Affect: Mood normal.     ED Results / Procedures / Treatments   Labs (all labs ordered are listed, but only abnormal results are displayed) Labs Reviewed  COMPREHENSIVE METABOLIC PANEL WITH GFR - Abnormal; Notable for the following components:      Result Value   Glucose, Bld 202 (*)    Creatinine, Ser 1.29 (*)    Calcium  8.6 (*)    Total Protein 6.2 (*)    Albumin 3.3 (*)    GFR, Estimated 44 (*)    All other components within normal limits  I-STAT VENOUS BLOOD GAS, ED - Abnormal; Notable for the following components:   pCO2, Ven 42.3 (*)    Calcium , Ion 1.14 (*)    All other components within normal limits  TROPONIN I (HIGH SENSITIVITY) -  Abnormal; Notable for the following components:   Troponin I (High Sensitivity) 34 (*)    All other components within normal limits  TROPONIN I (HIGH SENSITIVITY) - Abnormal; Notable for the following components:   Troponin I (High Sensitivity) 153 (*)    All other components within normal limits  CBC WITH DIFFERENTIAL/PLATELET  D-DIMER, QUANTITATIVE  MAGNESIUM     EKG EKG Interpretation Date/Time:  Sunday Dec 29 2023 11:44:17 EDT Ventricular Rate:  101 PR Interval:  183 QRS Duration:  103 QT Interval:  349 QTC Calculation: 453 R Axis:   161  Text Interpretation: Sinus tachycardia Low voltage, precordial leads RVH with secondary repolarization abnrm Confirmed by Rosealee Concha ([id number]) on 12/29/2023 11:50:59 AM  Radiology DG Chest Portable 1 View Result Date: 12/29/2023 CLINICAL DATA:  Shortness of breath. EXAM: PORTABLE CHEST 1 VIEW COMPARISON:  None Available. FINDINGS: The heart size and mediastinal contours are within normal limits. Low lung volumes are noted. Both lungs are clear. Multiple prior thoracic vertebroplasties are seen. IMPRESSION: Low lung volumes. No active disease. Electronically Signed   By: Marlyce Sine M.D.   On: 12/29/2023 12:25    Procedures Procedures    Medications Ordered in ED Medications  aspirin tablet 325 mg (has no administration in time range)  sodium chloride  0.9 % bolus 1,000 mL (0 mLs Intravenous Stopped 12/29/23 1400)    ED Course/ Medical Decision Making/ A&P Clinical Course as of 12/29/23 1503  Sun Dec 29, 2023  1456 Troponin I (High Sensitivity)(!!): 153 [JL]    Clinical Course User Index [JL] Rosealee Concha, MD             HEART Score: 7                    Medical Decision Making Amount and/or Complexity of Data Reviewed Labs: ordered. Decision-making details documented in ED Course. Radiology: ordered.  Risk OTC drugs. Decision regarding hospitalization.    49 old female presenting to the emergency department with a chief  complaint of heart palpitations and chest pain in addition to lightheadedness and near syncope.  The history is provided by the patient who states that she was cleaning her bathroom, and thinks she was using bleach chemicals when she began to feel sudden onset shortness of breath with associated lightheadedness.  She barely made it to the couch had to steady herself on the wall to prevent herself from passing out.  On EMS arrival the patient was found to be in SVT with heart rates in the 230s, was administered 6 mg of adenosine with subsequent conversion to sinus tachycardia to 110.  The patient was administered a 300 cc normal saline bolus.  She states that when she was experiencing the symptoms.  She had developed onset of chest discomfort which has since resolved with improvement in her heart rate.  On arrival, the patient was afebrile, temperature 98.2, tachycardic, heart rate 103, confirmed on cardiac telemetry, not tachypneic RR 18, BP 110/65, saturating 99% on room air.  Patient presents with near syncopal episode, likely episode of SVT seen by EMS, subsequent conversion to sinus tachycardia after 6 mg of adenosine was administered.  Patient had been experiencing chest pain in the setting of her elevated heart rate in the 230s.  This pain has now resolved.  Differential diagnosis includes electrolyte abnormality, ACS, PE, cardiac arrhythmia such as SVT.  Initial EKG revealed sinus tachycardia, ventricular rate 101, nonspecific repolarization changes present, no STEMI.  Chest x-ray revealed low lung volumes with no active disease.  Laboratory evaluation revealed magnesium  normal, D-dimer negative, CBC without a leukocytosis or anemia, VBG without an acidosis or alkalosis, CMP with mild hyperglycemia to 202 with a normal anion gap and bicarbonate, creatinine mildly elevated at 1.29, close to the patient's baseline of 1.1, LFTs normal.  Initial troponin elevated at 34, repeat climbing to 153.  Patient is  without chest pain at this time, strongly suspect demand ischemia however given the jump in troponin  from 34 to over 100 will engage cardiology.  Will administer aspirin, hold on heparin  administration at this time given complete resolution of chest pain, will continue to trend cardiac troponin.  Informed the patient and her daughter regarding the results of her diagnostic testing and plan for likely admission for observation with specialist consultation.  Cardiology: Discussed with Dr. Mallipeddi, likely demand ischemia, recommended echocardiogram and initiation of 12.5 mg metoprolol twice daily.  Hospitalist medicine consulted for admission for echocardiogram and observation, will plan to continue trending troponins, Dr. Sulema Endo accepting.  Final Clinical Impression(s) / ED Diagnoses Final diagnoses:  SVT (supraventricular tachycardia) (HCC)  Near syncope  NSTEMI (non-ST elevated myocardial infarction) [facility])    Rx / DC Orders ED Discharge Orders     None         Rosealee Concha, MD 12/29/23 1648

## 2023-12-29 NOTE — H&P (Signed)
 History and Physical    Patient: Jenny Cardenas ZOX:[id number] DOB: [birth year] DOA: 12/29/2023 DOS: the patient was seen and examined on 12/29/2023 PCP: Abraham Abo, MD  Patient coming from: Home  Chief Complaint:  Chief Complaint  Patient presents with   svt   HPI: Jenny Cardenas is a 73 y.o. female with medical history significant of HTN, HLD, DM2, hypothyroidism, lung cancer status post right upper lobectomy, and depression p/w near syncope and now with elevated troponins c/f demand ischemia.  Pt states that she was in her USOH until cleaning her toilet this morning at 11am. Pt states that while bent over cleaning her toilet (possible bleach containing solution), she became lightheaded; as such, she left the bathroom and walked to the living room and sat on her recliner. She called both of her daughters and told them what had happened, and as they were in route to her home to check in on her, she called a nursing help line that activated EMS.   In the ED, the pt was tachycardic, tachypneic, and hypotensive. On EMS arrival the patient was found to be in SVT with heart rates in the 230s, was administered 6 mg of adenosine with subsequent conversion to sinus tachycardia to 110. Labs notable for troponin 34-->153, Cr 1.29, and D-dimer 0.49. CXR wnl. EKG w/ sinus tachycardia. Pt admitted to medicine for ongoing care.  Review of Systems: As mentioned in the history of present illness. All other systems reviewed and are negative. Past Medical History:  Diagnosis Date   ADHD (attention deficit hyperactivity disorder) 2020   ADD   Anxiety    Arthritis    Broken heart syndrome 2013   Carpal tunnel syndrome    Cataract    Chronic kidney disease    Dementia (HCC)    Depression    Diabetes mellitus without complication (HCC)    Type 2   Dysrhythmia 1976   tachycardia   Fall 04/10/2015   GERD (gastroesophageal reflux disease)    Goiter    History of shingles    On abdomen, left ring  finger, and left leg; Pt takes Valtrex    Hyperlipidemia    Hypertension    Hypothyroidism    lung ca 04/2021   Obesity    PTSD (post-traumatic stress disorder)    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   Shortness of breath dyspnea    pt stated its related to the back problems she has   Thyroid  disease    Ulcer    Past Surgical History:  Procedure Laterality Date   ABDOMINAL HYSTERECTOMY  1996   BRONCHIAL BRUSHINGS  06/29/2021   Procedure: BRONCHIAL BRUSHINGS;  Surgeon: Denson Flake, MD;  Location: [facility] ENDOSCOPY;  Service: Pulmonary;;   BRONCHIAL NEEDLE ASPIRATION BIOPSY  06/29/2021   Procedure: BRONCHIAL NEEDLE ASPIRATION BIOPSIES;  Surgeon: Denson Flake, MD;  Location: [facility] ENDOSCOPY;  Service: Pulmonary;;   CATARACT EXTRACTION, BILATERAL     CESAREAN SECTION  1976, 1983, 1984   COLONOSCOPY W/ POLYPECTOMY     EYE SURGERY Bilateral 2014   cataratact and lasik surgery   FIDUCIAL MARKER PLACEMENT  06/29/2021   Procedure: FIDUCIAL DYE MARKING;  Surgeon: Denson Flake, MD;  Location: [facility] ENDOSCOPY;  Service: Pulmonary;;   INTERCOSTAL NERVE BLOCK  06/29/2021   Procedure: INTERCOSTAL NERVE BLOCK;  Surgeon: Hilarie Lovely, MD;  Location: [facility] OR;  Service: Thoracic;;   KYPHOPLASTY N/A 06/02/2015   Procedure: KYPHOPLASTY;  Surgeon: Virl Grimes, MD;  Location:  [facility] OR;  Service: Orthopedics;  Laterality: N/A;  Thoracic 3, 8, 10 kyphoplasty   LOBECTOMY  06/29/2021   Procedure: RIGHT UPPER LOBECTOMY;  Surgeon: Hilarie Lovely, MD;  Location: [facility] OR;  Service: Thoracic;;   LYMPH NODE DISSECTION  06/29/2021   Procedure: LYMPH NODE DISSECTION;  Surgeon: Hilarie Lovely, MD;  Location: [facility] OR;  Service: Thoracic;;   Social History:  reports that she quit smoking about 7 years ago. Her smoking use included cigarettes. She started smoking about 11 years ago. She has a 2 pack-year smoking history. She has never used smokeless tobacco. She reports that she does not currently use  alcohol . She reports that she does not use drugs.  Allergies  Allergen Reactions   Ethylene Oxide Hives, Itching and Other (See Comments)    Other reaction(s): scarred cornea, temporary blindness    Family History  Problem Relation Age of Onset   COPD Mother    Depression Mother    Hypertension Mother    Hyperlipidemia Mother    Breast cancer Mother 37   Diverticulosis Mother    Heart disease Father    Dementia Father    Breast cancer Sister 24   Breast cancer Sister 41 - 67   Non-Hodgkin's lymphoma Sister    Colon cancer Neg Hx     Prior to Admission medications   Medication Sig Start Date End Date Taking? Authorizing Provider  albuterol  (PROVENTIL ) (2.5 MG/3ML) 0.083% nebulizer solution Take 3 mLs (2.5 mg total) by nebulization every 4 (four) hours as needed for wheezing or shortness of breath. 03/29/22   Fargo, Amy E, NP  albuterol  (VENTOLIN  HFA) 108 (90 Base) MCG/ACT inhaler Inhale 1-2 puffs into the lungs every 6 (six) hours as needed for wheezing or shortness of breath. This is a one-time refill from me until the patient establish care with her primary care physician.  Please do not send request for refill to me in the future. 10/03/22   Abraham Abo, MD  buPROPion  (WELLBUTRIN  XL) 300 MG 24 hr tablet Take 1 tablet (300 mg total) by mouth daily. 12/11/23   Abraham Abo, MD  cyanocobalamin  (VITAMIN B12) 1000 MCG tablet Take 1 tablet (1,000 mcg total) by mouth daily. 05/13/23   Camara, Amadou, MD  fluocinonide cream (LIDEX) 0.05 % Apply 1 Application topically 2 (two) times daily. 04/12/23   [provider]  ibuprofen  (ADVIL ) 200 MG tablet Take 200 mg by mouth as needed.    [provider]  Ibuprofen -diphenhydrAMINE  Cit (MOTRIN  PM PO) Take 1 tablet by mouth at bedtime as needed.    [provider]  levocetirizine (XYZAL ) 5 MG tablet Take 5 mg by mouth daily as needed for allergies.    [provider]  levothyroxine  (SYNTHROID ) 25 MCG tablet Take  1 tablet (25 mcg total) by mouth daily before breakfast. 12/11/23   Abraham Abo, MD  lidocaine  (LIDODERM ) 5 % Place 1 patch onto the skin daily. Remove & Discard patch within 12 hours or as directed by MD 12/11/23   Abraham Abo, MD  Multiple Vitamins-Minerals (PRESERVISION AREDS 2 PO) Take 1 capsule by mouth in the morning and at bedtime.    [provider]  pantoprazole  (PROTONIX ) 40 MG tablet Take 1 tablet (40 mg total) by mouth daily. 08/21/23   Abraham Abo, MD  pravastatin  (PRAVACHOL ) 10 MG tablet Take 1 tablet (10 mg total) by mouth daily. 12/11/23   Abraham Abo, MD  PRESCRIPTION MEDICATION Natrol memory complex daily    [provider]  Probiotic Product (PROBIOTIC DAILY PO) Take by mouth daily.    [provider]  rivastigmine  (EXELON ) 1.5 MG capsule Take 1 capsule (1.5 mg total) by mouth 2 (two) times daily. 05/20/23 05/14/24  Camara, Amadou, MD  sertraline  (ZOLOFT ) 100 MG tablet Take 1 tablet (100 mg total) by mouth daily. 12/11/23   Abraham Abo, MD  traMADol  (ULTRAM ) 50 MG tablet Take 1 tablet (50 mg total) by mouth every 6 (six) hours as needed (mild pain). 10/03/22   Abraham Abo, MD  traZODone  (DESYREL ) 100 MG tablet TAKE 1 TABLET AT BEDTIME 11/14/23   Abraham Abo, MD    Physical Exam: Vitals:   12/29/23 1400 12/29/23 1445 12/29/23 1630 12/29/23 1639  BP: (!) 102/57 (!) 105/54 123/64   Pulse: 76 78 80 83  Resp: 15 10 17 17   Temp:    97.9 F (36.6 C)  TempSrc:    Oral  SpO2: 98% 95% 98% 99%  Weight:      Height:       General: Alert, oriented x3, resting comfortably in no acute distress HEENT: EOMI, oropharynx clear, moist mucous membranes, hearing intact Neck: Trachea midline and no gross thyromegaly Respiratory: Lungs clear to auscultation bilaterally with normal respiratory effort; no w/r/r Cardiovascular: Regular rate and rhythm w/o m/r/g Abdomen: Soft, nontender, nondistended. Positive bowel sounds MSK: No obvious joint deformities  or swelling Skin: No obvious rashes or lesions Neurologic: Awake, alert, spontaneously moves all extremities, strength intact Psychiatric: Appropriate mood and affect, conversational and cooperative  Data Reviewed:  Lab Results  Component Value Date   WBC 7.5 12/29/2023   HGB 12.9 12/29/2023   HCT 38.0 12/29/2023   MCV 89.7 12/29/2023   PLT 252 12/29/2023   Lab Results  Component Value Date   GLUCOSE 202 (H) 12/29/2023   CALCIUM  8.6 (L) 12/29/2023   NA 142 12/29/2023   K 3.7 12/29/2023   CO2 23 12/29/2023   CL 104 12/29/2023   BUN 11 12/29/2023   CREATININE 1.29 (H) 12/29/2023   Lab Results  Component Value Date   ALT 8 12/29/2023   AST 19 12/29/2023   ALKPHOS 66 12/29/2023   BILITOT 0.7 12/29/2023   Lab Results  Component Value Date   INR 1.1 06/27/2021   INR 1.03 06/01/2015   INR 0.97 10/19/2010    Radiology: DG Chest Portable 1 View Result Date: 12/29/2023 CLINICAL DATA:  Shortness of breath. EXAM: PORTABLE CHEST 1 VIEW COMPARISON:  None Available. FINDINGS: The heart size and mediastinal contours are within normal limits. Low lung volumes are noted. Both lungs are clear. Multiple prior thoracic vertebroplasties are seen. IMPRESSION: Low lung volumes. No active disease. Electronically Signed   By: Marlyce Sine M.D.   On: 12/29/2023 12:25    Assessment and Plan: 46F h/o HTN, HLD, DM2, hypothyroidism, lung cancer status post right upper lobectomy, and depression p/w near syncope and SVT now with elevated troponins c/f demand ischemia.  NSTEMI, type 2 Demand ischemia Elevated troponins SVT High suspicion for demand ischemia iso  underlying CAD -Cards consulted; apprec eval/recs -Tele -Metoprolol 12.5mg  BID for now -F/u TTE -F/u troponins; consider starting heparin  gtt if further troponin elevation or cp  Hypothyroidism -PTA synthroid  25mcg daily  Depression -PTA Zoloft    Advance Care Planning:   Code Status: Full Code    Consults: Cards  Family  Communication: N/A  Severity of Illness: The appropriate patient status for this patient is INPATIENT. Inpatient status is judged to be reasonable and  necessary in order to provide the required intensity of service to ensure the patient's safety. The patient's presenting symptoms, physical exam findings, and initial radiographic and laboratory data in the context of their chronic comorbidities is felt to place them at high risk for further clinical deterioration. Furthermore, it is not anticipated that the patient will be medically stable for discharge from the hospital within 2 midnights of admission.   * I certify that at the point of admission it is my clinical judgment that the patient will require inpatient hospital care spanning beyond 2 midnights from the point of admission due to high intensity of service, high risk for further deterioration and high frequency of surveillance required.*   ------- I spent 55 minutes reviewing previous labs/notes, obtaining separate history at the bedside, counseling/discussing the treatment plan outlined above, ordering medications/tests, and performing clinical documentation.  Author: Arne Langdon, MD 12/29/2023 4:47 PM  For on call review [email or web address].

## 2023-12-29 NOTE — ED Notes (Signed)
 Patient placed onto a bedpan.

## 2023-12-30 ENCOUNTER — Inpatient Hospital Stay (HOSPITAL_COMMUNITY)

## 2023-12-30 ENCOUNTER — Other Ambulatory Visit (HOSPITAL_COMMUNITY): Payer: Self-pay

## 2023-12-30 DIAGNOSIS — R7989 Other specified abnormal findings of blood chemistry: Secondary | ICD-10-CM | POA: Diagnosis not present

## 2023-12-30 DIAGNOSIS — R55 Syncope and collapse: Secondary | ICD-10-CM | POA: Diagnosis not present

## 2023-12-30 DIAGNOSIS — E785 Hyperlipidemia, unspecified: Secondary | ICD-10-CM

## 2023-12-30 DIAGNOSIS — I471 Supraventricular tachycardia, unspecified: Secondary | ICD-10-CM | POA: Diagnosis not present

## 2023-12-30 LAB — ECHOCARDIOGRAM COMPLETE
AR max vel: 1.45 cm2
AV Area VTI: 1.6 cm2
AV Area mean vel: 1.4 cm2
AV Mean grad: 3 mmHg
AV Peak grad: 5.9 mmHg
Ao pk vel: 1.21 m/s
Area-P 1/2: 4.46 cm2
Calc EF: 73.9 %
S' Lateral: 2.7 cm
Single Plane A2C EF: 75.1 %
Single Plane A4C EF: 71.8 %

## 2023-12-30 LAB — BASIC METABOLIC PANEL WITH GFR
Anion gap: 6 (ref 5–15)
BUN: 12 mg/dL (ref 8–23)
CO2: 27 mmol/L (ref 22–32)
Calcium: 8.5 mg/dL — ABNORMAL LOW (ref 8.9–10.3)
Chloride: 108 mmol/L (ref 98–111)
Creatinine, Ser: 1.11 mg/dL — ABNORMAL HIGH (ref 0.44–1.00)
GFR, Estimated: 52 mL/min — ABNORMAL LOW (ref >60)
Glucose, Bld: 102 mg/dL — ABNORMAL HIGH (ref 70–99)
Potassium: 4.5 mmol/L (ref 3.5–5.1)
Sodium: 141 mmol/L (ref 135–145)

## 2023-12-30 LAB — GLUCOSE, CAPILLARY
Glucose-Capillary: 102 mg/dL — ABNORMAL HIGH (ref 70–99)
Glucose-Capillary: 103 mg/dL — ABNORMAL HIGH (ref 70–99)

## 2023-12-30 LAB — CBC
HCT: 36.4 % (ref 36.0–46.0)
Hemoglobin: 11.5 g/dL — ABNORMAL LOW (ref 12.0–15.0)
MCH: 28.3 pg (ref 26.0–34.0)
MCHC: 31.6 g/dL (ref 30.0–36.0)
MCV: 89.4 fL (ref 80.0–100.0)
Platelets: 252 10*3/uL (ref 150–400)
RBC: 4.07 MIL/uL (ref 3.87–5.11)
RDW: 13 % (ref 11.5–15.5)
WBC: 8 10*3/uL (ref 4.0–10.5)
nRBC: 0 % (ref 0.0–0.2)

## 2023-12-30 MED ORDER — INSULIN ASPART 100 UNIT/ML IJ SOLN: 0.0000 [IU] | Freq: Three times a day (TID) | INTRAMUSCULAR | Status: DC

## 2023-12-30 MED ORDER — METOPROLOL SUCCINATE ER 25 MG PO TB24
25.0000 mg | ORAL_TABLET | Freq: Every day | ORAL | 2 refills | Status: DC
  Filled 2023-12-30: qty 30, 30d supply, fill #0

## 2023-12-30 MED ORDER — METOPROLOL SUCCINATE ER 25 MG PO TB24: 25.0000 mg | ORAL_TABLET | Freq: Every day | ORAL | 0 refills | Status: DC

## 2023-12-30 MED ORDER — PERFLUTREN LIPID MICROSPHERE
1.0000 mL | INTRAVENOUS | Status: AC | PRN
  Administered 2023-12-30: 2 mL via INTRAVENOUS

## 2023-12-30 MED ORDER — METOPROLOL SUCCINATE ER 25 MG PO TB24: 25.0000 mg | ORAL_TABLET | Freq: Every day | ORAL | Status: DC

## 2023-12-30 MED ORDER — TRAZODONE HCL 100 MG PO TABS: 100.0000 mg | ORAL_TABLET | Freq: Every day | ORAL | Status: DC

## 2023-12-30 NOTE — Discharge Instructions (Signed)
 Vagal maneuvers are other ways to try to manage SVT. Here is some information for the [facility]: What are vagal maneuvers? Vagal maneuvers are physical actions that make your vagus nerve act on your heart's natural pacemaker, slowing down its electrical impulses. Your vagus nerve -- which goes from your brainstem to your belly -- plays a major role in your parasympathetic nervous system, which controls a number of things in your body, including heart rate.  Healthcare providers can do vagal maneuvers when it makes sense for a person with a fast heart rate. Don't try these yourself without talking to your healthcare provider first.  Types of vagal maneuvers Healthcare providers often use these:  Valsalva maneuver (bearing down like you're having a bowel movement (pooping). See below). Diving reflex. Carotid sinus massage. Gag reflex. Coughing. Handstand for 30 seconds. (In one study, healthcare providers taught parents how to help their kids do this.) Applied abdominal pressure. (Try lying on your back and folding your lower body toward your face until your feet are past your head. Take a breath and strain for 20 to 30 seconds.)  Why are vagal maneuvers used? Vagal maneuvers are a first-line (first choice) treatment for supraventricular tachycardia (SVT) (fast heart rate) because they're a low-risk, low-cost way to slow down a heart rate that's too fast. They can have a 20% to 40% success rate for getting certain fast heart rhythms (more than 100 beats a minute) back to normal rhythms.  Vagal maneuvers can also help your healthcare provider diagnose which type of arrhythmia (irregular or abnormal beat) you have, as certain types of heart rhythm disorders classically respond to this maneuver.  Who shouldn't have vagal maneuvers? Your healthcare provider will only use vagal maneuvers if you're considered stable. They won't do vagal maneuvers if you're unstable, meaning you have:  Low  blood pressure. Chest pain. Shortness of breath. A shortage of oxygen in your body. An inability to get enough blood to your organs. If you're unstable, your healthcare provider will do cardioversion (using medicine or an electrical shock) instead of vagal maneuvers. Anyone who's feeling unwell should go to an emergency room or call 911 immediately.  How commonly are vagal maneuvers used? Supraventricular tachycardia (SVT) is common in adults and children, and is the most common heart rhythm abnormality in children. An estimated 1 in 250 to 1 in 1,000 children have SVT. Since vagal maneuvers are the first treatment choice for SVT, they're commonly used.  Procedure Details What happens before vagal maneuvers? Your healthcare provider will do an electrocardiogram (EKG) to check your heart rhythm. They'll monitor your heart rate, blood pressure and oxygen level.  What happens during vagal maneuvers? Here's how healthcare providers do the three most common vagal maneuvers:  Diving reflex While sitting, you'll take several deep breaths, hold your breath and then quickly put your whole face into a container of ice water. Keep your face submerged as long as you can.  The alternative approach is putting a bag of ice water or an ice-cold, wet towel against your face.  Valsalva maneuver While lying on your back, take a deep breath and act like you're exhaling but with your nose and mouth closed for 10 to 30 seconds. It should feel like trying to breathe air out into a blocked straw.  In a modified version of this maneuver (which can work better than the original method), you can do this while sitting up and then have your healthcare provider quickly drop the part of  the bed supporting your upper body.  When they lower your bed, they bring your knees to your chest or put your legs in the air. Keep your legs in that position 30 to 45 seconds longer than holding your breath.  Another Valsalva  technique healthcare providers use for kids is to have them blow on their thumb without letting any air out.  What happens after vagal maneuvers? Hopefully, the arrhythmia (irregular or abnormal beat) resolves. Your healthcare provider will do another electrocardiogram (EKG) to see if the vagal maneuver was successful at bringing your heart rhythm back to normal. If they try vagal maneuvers two or three times and they don't work, they can give you medication to treat your arrhythmia. Medical or electrical cardioversion is another treatment option.

## 2023-12-30 NOTE — Care Management CC44 (Signed)
 Condition Code 44 Documentation Completed  Patient Details  Name: Jenny Cardenas MRN: [id number] Date of Birth: [birth year]   Condition Code 44 given:  Yes Patient signature on Condition Code 44 notice:  Yes Documentation of 2 MD's agreement:  Yes Code 44 added to claim:  Yes    Cosimo Diones, RN 12/30/2023, 11:36 AM

## 2023-12-30 NOTE — TOC CM/SW Note (Signed)
 Transition of Care [facility]) - Inpatient Brief Assessment   Patient Details  Name: Jenny Cardenas MRN: [id number] Date of Birth: 14-Jul-1951  Transition of Care [facility]) CM/SW Contact:    Cosimo Diones, RN Phone Number: 12/30/2023, 11:42 AM   Clinical Narrative: Patient presented for Supraventricular tachycardia (SVT). PTA patient was from home with her mother. Patient's daughter at the bedside during the visit. Patient has DME rolling walker in the home; however, does not use it. No home needs identified at this time. Daughter will provide transportation home once stable.    Transition of Care Asessment: Insurance and Status: Insurance coverage has been reviewed Patient has primary care physician: Yes Home environment has been reviewed: reviewed Prior level of function:: independent Prior/Current Home Services: No current home services Social Drivers of Health Review: SDOH reviewed no interventions necessary Readmission risk has been reviewed: Yes Transition of care needs: no transition of care needs at this time

## 2023-12-30 NOTE — Plan of Care (Signed)
   Problem: Education: Goal: Knowledge of General Education information will improve Description Including pain rating scale, medication(s)/side effects and non-pharmacologic comfort measures Outcome: Progressing

## 2023-12-30 NOTE — Discharge Summary (Signed)
 Physician Discharge Summary   Patient: Jenny Cardenas MRN: [id number] DOB: 12/14/1950  Admit date:     12/29/2023  Discharge date: 12/30/23  Discharge Physician: Danette Duos   PCP: Abraham Abo, MD   Recommendations at discharge:    Close follow up with cardiologist   Discharge Diagnoses: Principal Problem:   SVT (supraventricular tachycardia) (HCC)  Resolved Problems:   * No resolved hospital problems. *  Hospital Course: 73 year old with past medical history significant for hypertension, hyperlipidemia, diabetes type 2, hypothyroidism, lung cancer status post right upper lobectomy, depression, presents with near syncope and elevated troponin.  Patient reports that while she was cleaning her toilet she became lightheaded.  She went to the living room and sat in her recliner and called family for help.   In the ED she was tachycardic, tachypneic, hypotensive.  On EMS arrival she was found to be in SVT heart rate 230s.  She received 6 mg of adenosine with subsequent conversion to sinus tachycardia.  Troponin were elevated 34----153--245.  Cardiology was consulted  Assessment and Plan: 1-non-STEMI type II Demand ischemia Elevated troponins SVT Presents with near syncope and SVT. Received a dose of Adenosine by EMS  -LDL 133, troponin 34 subsequently to 45 -Cardiology consulted -Started on Metoprolol .  -ECHO: stable per cardio.  Ok to discharge home today.      Hypothyroidism -Continue with Synthroid  - TSH 2.5, free T41.4   Depression: -Continue with Zoloft , Wellbutrin  Resume trazodone  at bedtime   Dementia: Continue with rivastigmine    HLD;  -Continue Pravastatin .    Diabetes type 2: Carb Modified diet.  SSI            Consultants: Cardiology  Procedures performed: ECHO  Disposition: Home Diet recommendation:  Discharge Diet Orders (From admission, onward)     Start     Ordered   12/30/23 0000  Diet - low sodium heart healthy        12/30/23  1240           Cardiac diet DISCHARGE MEDICATION: Allergies as of 12/30/2023       Reactions   Ethylene Oxide Hives, Itching, Other (See Comments)   Other reaction(s): scarred cornea, temporary blindness        Medication List     TAKE these medications    albuterol  (2.5 MG/3ML) 0.083% nebulizer solution Commonly known as: PROVENTIL  Take 3 mLs (2.5 mg total) by nebulization every 4 (four) hours as needed for wheezing or shortness of breath.   albuterol  108 (90 Base) MCG/ACT inhaler Commonly known as: VENTOLIN  HFA Inhale 1-2 puffs into the lungs every 6 (six) hours as needed for wheezing or shortness of breath. This is a one-time refill from me until the patient establish care with her primary care physician.  Please do not send request for refill to me in the future.   buPROPion  300 MG 24 hr tablet Commonly known as: Wellbutrin  XL Take 1 tablet (300 mg total) by mouth daily. What changed: when to take this   cyanocobalamin  1000 MCG tablet Commonly known as: VITAMIN B12 Take 1 tablet (1,000 mcg total) by mouth daily.   ibuprofen  200 MG tablet Commonly known as: ADVIL  Take 200 mg by mouth every 8 (eight) hours as needed (pain).   levocetirizine 5 MG tablet Commonly known as: XYZAL  Take 5 mg by mouth daily as needed for allergies.   levothyroxine  25 MCG tablet Commonly known as: SYNTHROID  Take 1 tablet (25 mcg total) by mouth daily before breakfast.  lidocaine  5 % Commonly known as: Lidoderm  Place 1 patch onto the skin daily. Remove & Discard patch within 12 hours or as directed by MD   metoprolol  succinate 25 MG 24 hr tablet Commonly known as: TOPROL -XL Take 1 tablet (25 mg total) by mouth daily. Start taking on: Dec 31, 2023   pantoprazole  40 MG tablet Commonly known as: PROTONIX  Take 1 tablet (40 mg total) by mouth daily.   pravastatin  10 MG tablet Commonly known as: PRAVACHOL  Take 1 tablet (10 mg total) by mouth daily. What changed: when to take  this   PRESCRIPTION MEDICATION Natrol memory complex daily   PRESERVISION AREDS 2 PO Take 1 capsule by mouth in the morning and at bedtime.   PROBIOTIC DAILY PO Take by mouth daily.   rivastigmine  1.5 MG capsule Commonly known as: EXELON  Take 1 capsule (1.5 mg total) by mouth 2 (two) times daily.   sertraline  100 MG tablet Commonly known as: ZOLOFT  Take 1 tablet (100 mg total) by mouth daily. What changed: when to take this   traZODone  100 MG tablet Commonly known as: DESYREL  TAKE 1 TABLET AT BEDTIME        Discharge Exam: Filed Weights   12/29/23 1144  Weight: 68.4 kg   General; NAD  Condition at discharge: stable  The results of significant diagnostics from this hospitalization (including imaging, microbiology, ancillary and laboratory) are listed below for reference.   Imaging Studies: DG Chest Portable 1 View Result Date: 12/29/2023 CLINICAL DATA:  Shortness of breath. EXAM: PORTABLE CHEST 1 VIEW COMPARISON:  None Available. FINDINGS: The heart size and mediastinal contours are within normal limits. Low lung volumes are noted. Both lungs are clear. Multiple prior thoracic vertebroplasties are seen. IMPRESSION: Low lung volumes. No active disease. Electronically Signed   By: Marlyce Sine M.D.   On: 12/29/2023 12:25    Microbiology: Results for orders placed or performed during the hospital encounter of 09/22/22  Urine Culture (for pregnant, neutropenic or urologic patients or patients with an indwelling urinary catheter)     Status: Abnormal   Collection Time: 09/22/22 12:08 PM   Specimen: Urine, Clean Catch  Result Value Ref Range Status   Specimen Description URINE, CLEAN CATCH  Final   Special Requests   Final    Normal Performed at [facility], [address], [place], [place] [address]    Culture (A)  Final    70,000 COLONIES/mL ESCHERICHIA COLI Confirmed Extended Spectrum Beta-Lactamase Producer (ESBL).  In bloodstream infections from ESBL  organisms, carbapenems are preferred over piperacillin/tazobactam. They are shown to have a lower risk of mortality.    Report Status 09/24/2022 FINAL  Final   Organism ID, Bacteria ESCHERICHIA COLI (A)  Final      Susceptibility   Escherichia coli - MIC*    AMPICILLIN >=32 RESISTANT Resistant     CEFAZOLIN  >=64 RESISTANT Resistant     CEFEPIME 2 SENSITIVE Sensitive     CEFTRIAXONE  >=64 RESISTANT Resistant     CIPROFLOXACIN >=4 RESISTANT Resistant     GENTAMICIN >=16 RESISTANT Resistant     IMIPENEM <=0.25 SENSITIVE Sensitive     NITROFURANTOIN <=16 SENSITIVE Sensitive     TRIMETH/SULFA >=320 RESISTANT Resistant     AMPICILLIN/SULBACTAM 4 SENSITIVE Sensitive     PIP/TAZO <=4 SENSITIVE Sensitive     * 70,000 COLONIES/mL ESCHERICHIA COLI    Labs: CBC: Recent Labs  Lab 12/29/23 1152 12/29/23 1201 12/29/23 2046 12/30/23 1143  WBC 7.5  --  9.2 8.0  NEUTROABS 6.2  --   --   --   HGB 12.4 12.9 11.9* 11.5*  HCT 39.4 38.0 37.2 36.4  MCV 89.7  --  88.6 89.4  PLT 252  --  261 252   Basic Metabolic Panel: Recent Labs  Lab 12/29/23 1152 12/29/23 1201 12/29/23 1649 12/30/23 1143  NA 139 142  --  141  K 3.7 3.7  --  4.5  CL 104  --   --  108  CO2 23  --   --  27  GLUCOSE 202*  --   --  102*  BUN 11  --   --  12  CREATININE 1.29*  --  1.07* 1.11*  CALCIUM  8.6*  --   --  8.5*  MG 2.0  --   --   --    Liver Function Tests: Recent Labs  Lab 12/29/23 1152  AST 19  ALT 8  ALKPHOS 66  BILITOT 0.7  PROT 6.2*  ALBUMIN 3.3*   CBG: Recent Labs  Lab 12/30/23 0805 12/30/23 1151  GLUCAP 102* 103*    Discharge time spent: greater than 30 minutes.  Signed: Danette Duos, MD [facility] 12/30/2023

## 2023-12-30 NOTE — Care Management Obs Status (Signed)
 MEDICARE OBSERVATION STATUS NOTIFICATION   Patient Details  Name: Jenny Cardenas MRN: [id number] Date of Birth: Mar 04, 1951   Medicare Observation Status Notification Given:  Yes    Cosimo Diones, RN 12/30/2023, 11:36 AM

## 2023-12-30 NOTE — Progress Notes (Signed)
*  PRELIMINARY RESULTS* Echocardiogram 2D Echocardiogram has been performed.  Jenny Cardenas 12/30/2023, 11:14 AM

## 2023-12-30 NOTE — Progress Notes (Signed)
 PT Cancellation Note  Patient Details Name: Jenny Cardenas MRN: [id number] DOB: 1950-09-09   Cancelled Treatment:    Reason Eval/Treat Not Completed: Patient declined, no reason specified (pt and daughter [name] report pt walked earlier with mobility, performed well and no concern over return home, politely declined need for therapy eval. Will sign off)   [name] [name] 12/30/2023, 11:52 AM Annis Baseman, PT Acute Rehabilitation Services Office: [phone]

## 2023-12-30 NOTE — Progress Notes (Signed)
 Rounding Note    Patient Name: Jenny Cardenas Date of Encounter: 12/30/2023  [facility] Cardiologist: None   Subjective   Denies any CP or SOB. Mild throat pain.   Inpatient Medications    Scheduled Meds:  aspirin   325 mg Oral Daily   buPROPion   300 mg Oral Daily   enoxaparin  (LOVENOX ) injection  40 mg Subcutaneous Q24H   insulin  aspart  0-15 Units Subcutaneous TID WC   levothyroxine   25 mcg Oral QAC breakfast   metoprolol  tartrate  12.5 mg Oral BID   pantoprazole   40 mg Oral Daily   pravastatin   10 mg Oral Daily   rivastigmine   1.5 mg Oral BID   sertraline   100 mg Oral Daily   traZODone   100 mg Oral QHS   Continuous Infusions:  PRN Meds: acetaminophen , melatonin, mouth rinse   Vital Signs    Vitals:   12/29/23 2200 12/30/23 0000 12/30/23 0500 12/30/23 0737  BP: 99/60 (!) 98/58 (!) 93/54 (!) 101/59  Pulse: 70 65 60 64  Resp: 17 16 16 19   Temp:  98.1 F (36.7 C) 98.1 F (36.7 C) 98.7 F (37.1 C)  TempSrc:  Oral Oral Oral  SpO2: 97% 95% 96% 98%  Weight:      Height:        Intake/Output Summary (Last 24 hours) at 12/30/2023 0930 Last data filed at 12/29/2023 2000 Gross per 24 hour  Intake --  Output 350 ml  Net -350 ml      12/29/2023   11:44 AM 12/11/2023    2:35 PM 09/11/2023    2:08 PM  Last 3 Weights  Weight (lbs) 150 lb 12.8 oz 150 lb 12.8 oz 156 lb  Weight (kg) 68.402 kg 68.402 kg 70.761 kg      Telemetry    NSR without recurrent SVT - Personally Reviewed  ECG    NSR with RBBB - Personally Reviewed  Physical Exam   GEN: No acute distress.   Neck: No JVD Cardiac: RRR, no murmurs, rubs, or gallops.  Respiratory: Clear to auscultation bilaterally. GI: Soft, nontender, non-distended  MS: No edema; No deformity. Neuro:  Nonfocal  Psych: Normal affect   Labs    High Sensitivity Troponin:   Recent Labs  Lab 12/29/23 1152 12/29/23 1400 12/29/23 1649 12/29/23 1858  TROPONINIHS 34* 153* 209* 245*     Chemistry Recent  Labs  Lab 12/29/23 1152 12/29/23 1201 12/29/23 1649  NA 139 142  --   K 3.7 3.7  --   CL 104  --   --   CO2 23  --   --   GLUCOSE 202*  --   --   BUN 11  --   --   CREATININE 1.29*  --  1.07*  CALCIUM  8.6*  --   --   MG 2.0  --   --   PROT 6.2*  --   --   ALBUMIN 3.3*  --   --   AST 19  --   --   ALT 8  --   --   ALKPHOS 66  --   --   BILITOT 0.7  --   --   GFRNONAA 44*  --  55*  ANIONGAP 12  --   --     Lipids No results for input(s): "CHOL", "TRIG", "HDL", "LABVLDL", "LDLCALC", "CHOLHDL" in the last 168 hours.  Hematology Recent Labs  Lab 12/29/23 1152 12/29/23 1201 12/29/23 2046  WBC 7.5  --  9.2  RBC 4.39  --  4.20  HGB 12.4 12.9 11.9*  HCT 39.4 38.0 37.2  MCV 89.7  --  88.6  MCH 28.2  --  28.3  MCHC 31.5  --  32.0  RDW 12.8  --  12.9  PLT 252  --  261   Thyroid  No results for input(s): "TSH", "FREET4" in the last 168 hours.  BNPNo results for input(s): "BNP", "PROBNP" in the last 168 hours.  DDimer  Recent Labs  Lab 12/29/23 1152  DDIMER 0.49     Radiology    DG Chest Portable 1 View Result Date: 12/29/2023 CLINICAL DATA:  Shortness of breath. EXAM: PORTABLE CHEST 1 VIEW COMPARISON:  None Available. FINDINGS: The heart size and mediastinal contours are within normal limits. Low lung volumes are noted. Both lungs are clear. Multiple prior thoracic vertebroplasties are seen. IMPRESSION: Low lung volumes. No active disease. Electronically Signed   By: Marlyce Sine M.D.   On: 12/29/2023 12:25    Cardiac Studies   Pending echocardiogram  Patient Profile     73 y.o. female with PMH of CKD, DM II and questionable dementia who presented SVT with HR 230s. IV adenosine administered and converted her back to NSR. Metoprolol  started.    Assessment & Plan    SVT  - converted on adenosine. No recurrence. Continue metoprolol  12.5mg  BID. Pending echocardiogram. If normal, maybe discharged this afternoon.   Mildly elevated troponin: 34>>153>>209>>245 borderline  elevated, but flat, consistent with demand ischemia from SVT.   HLD: on pravastatin       For questions or updates, please contact [facility] Please consult [email or web address] for contact info under        Signed, Ervin Heath, PA  12/30/2023, 9:30 AM

## 2023-12-30 NOTE — Progress Notes (Signed)
 Mobility Specialist Progress Note;   12/30/23 0934  Mobility  Activity Ambulated with assistance in room  Level of Assistance Standby assist, set-up cues, supervision of patient - no hands on  Assistive Device Front wheel walker  Distance Ambulated (ft) 35 ft  Activity Response Tolerated well  Mobility Referral Yes  Mobility visit 1 Mobility  Mobility Specialist Start Time (ACUTE ONLY) 0934  Mobility Specialist Stop Time (ACUTE ONLY) 0943  Mobility Specialist Time Calculation (min) (ACUTE ONLY) 9 min   Pt agreeable to mobility. Able to perform all mobility at SV level. VSS throughout and no c/o when asked. Pt returned back to bed with all needs met. Daughter in room.   Janit Meline Mobility Specialist Please contact via SecureChat or [employer] [phone]

## 2023-12-31 ENCOUNTER — Telehealth: Payer: Self-pay

## 2023-12-31 NOTE — Transitions of Care (Post Inpatient/ED Visit) (Signed)
 12/31/2023  Name: Jenny Cardenas MRN: [id number] DOB: August 15, 1950  Today's TOC FU Call Status: Today's TOC FU Call Status:: Successful TOC FU Call Completed TOC FU Call Complete Date: 12/31/23 Patient's Name and Date of Birth confirmed.  Transition Care Management Follow-up Telephone Call Date of Discharge: 12/30/23 Discharge Facility: [facility]) Type of Discharge: Inpatient Admission Primary Inpatient Discharge Diagnosis:: syncope How have you been since you were released from the hospital?: Better Any questions or concerns?: No  Items Reviewed: Did you receive and understand the discharge instructions provided?: Yes Medications obtained,verified, and reconciled?: Yes (Medications Reviewed) Any new allergies since your discharge?: No Dietary orders reviewed?: Yes Do you have support at home?: No  Medications Reviewed Today: Medications Reviewed Today     Reviewed by Darrall Ellison, LPN (Licensed Practical Nurse) on 12/31/23 at 1010  Med List Status: <None>   Medication Order Taking? Sig Documenting Provider Last Dose Status Informant  albuterol  (PROVENTIL ) (2.5 MG/3ML) 0.083% nebulizer solution [id number] No Take 3 mLs (2.5 mg total) by nebulization every 4 (four) hours as needed for wheezing or shortness of breath. Arnetha Bhat, NP Past Month Active Self, Child, Pharmacy Records  albuterol  (VENTOLIN  HFA) 108 [phone] Base) MCG/ACT inhaler [id number] No Inhale 1-2 puffs into the lungs every 6 (six) hours as needed for wheezing or shortness of breath. This is a one-time refill from me until the patient establish care with her primary care physician.  Please do not send request for refill to me in the future. Abraham Abo, MD 12/29/2023 Active Self, Child, Pharmacy Records  buPROPion  (WELLBUTRIN  XL) 300 MG 24 hr tablet [id number] No Take 1 tablet (300 mg total) by mouth daily.  Patient taking differently: Take 300 mg by mouth daily with lunch.   Abraham Abo, MD 12/28/2023 Active  Self, Child, Pharmacy Records  cyanocobalamin  (VITAMIN B12) 1000 MCG tablet [id number] No Take 1 tablet (1,000 mcg total) by mouth daily. Cassandra Cleveland, MD 12/29/2023 Active Self, Child, Pharmacy Records  ibuprofen  (ADVIL ) 200 MG tablet [id number] No Take 200 mg by mouth every 8 (eight) hours as needed (pain). [provider] Past Month Active Self, Child, Pharmacy Records  levocetirizine (XYZAL ) 5 MG tablet [id number] No Take 5 mg by mouth daily as needed for allergies. [provider] 12/29/2023 Active Self, Child, Pharmacy Records  levothyroxine  (SYNTHROID ) 25 MCG tablet [id number] No Take 1 tablet (25 mcg total) by mouth daily before breakfast. Abraham Abo, MD 12/29/2023 Active Self, Child, Pharmacy Records  lidocaine  (LIDODERM ) 5 % [id number] No Place 1 patch onto the skin daily. Remove & Discard patch within 12 hours or as directed by MD Abraham Abo, MD 12/28/2023 Active Self, Child, Pharmacy Records  metoprolol  succinate (TOPROL -XL) 25 MG 24 hr tablet [id number]  Take 1 tablet (25 mg total) by mouth daily. Regalado, Belkys A, MD  Active   Multiple Vitamins-Minerals (PRESERVISION AREDS 2 PO) [id number] No Take 1 capsule by mouth in the morning and at bedtime. [provider] 12/29/2023 Active Self, Child, Pharmacy Records  pantoprazole  (PROTONIX ) 40 MG tablet [id number] No Take 1 tablet (40 mg total) by mouth daily. Abraham Abo, MD 12/29/2023 Active Self, Child, Pharmacy Records  pravastatin  (PRAVACHOL ) 10 MG tablet [id number] No Take 1 tablet (10 mg total) by mouth daily.  Patient taking differently: Take 10 mg by mouth daily with lunch.   Abraham Abo, MD 12/28/2023 Active Self, Child, Pharmacy Records  PRESCRIPTION MEDICATION [id number] No Natrol memory complex daily [provider] Unknown Active Self, Child, Pharmacy  Records  Probiotic Product (PROBIOTIC DAILY PO) [id number] No Take by mouth daily. [provider] 12/28/2023 Active Self, Child, Pharmacy  Records  rivastigmine  (EXELON ) 1.5 MG capsule [id number] No Take 1 capsule (1.5 mg total) by mouth 2 (two) times daily. Cassandra Cleveland, MD 12/29/2023 Active Self, Child, Pharmacy Records  sertraline  (ZOLOFT ) 100 MG tablet [id number] No Take 1 tablet (100 mg total) by mouth daily.  Patient taking differently: Take 100 mg by mouth daily with lunch.   Abraham Abo, MD 12/28/2023 Active Self, Child, Pharmacy Records  traZODone  (DESYREL ) 100 MG tablet [id number] No TAKE 1 TABLET AT BEDTIME Abraham Abo, MD 12/28/2023 Active Self, Child, Pharmacy Records            Home Care and Equipment/Supplies: Were Home Health Services Ordered?: NA Any new equipment or medical supplies ordered?: NA  Functional Questionnaire: Do you need assistance with bathing/showering or dressing?: No Do you need assistance with meal preparation?: No Do you need assistance with eating?: No Do you have difficulty maintaining continence: No Do you need assistance with getting out of bed/getting out of a chair/moving?: No Do you have difficulty managing or taking your medications?: No  Follow up appointments reviewed: PCP Follow-up appointment confirmed?: NA Specialist Hospital Follow-up appointment confirmed?: Yes Date of Specialist follow-up appointment?: 01/28/24 Follow-Up Specialty Provider:: cardio Do you need transportation to your follow-up appointment?: No Do you understand care options if your condition(s) worsen?: Yes-patient verbalized understanding    SIGNATURE Darrall Ellison, LPN [facility] Nurse Health Advisor Direct Dial [phone]

## 2024-01-08 ENCOUNTER — Other Ambulatory Visit: Payer: Self-pay | Admitting: Family Medicine

## 2024-01-08 DIAGNOSIS — E7800 Pure hypercholesterolemia, unspecified: Secondary | ICD-10-CM

## 2024-01-14 ENCOUNTER — Encounter: Payer: Self-pay | Admitting: Family

## 2024-01-14 ENCOUNTER — Ambulatory Visit (INDEPENDENT_AMBULATORY_CARE_PROVIDER_SITE_OTHER): Admitting: Family

## 2024-01-14 VITALS — BP 114/65 | HR 62 | Temp 99.0°F | Resp 16 | Ht 60.0 in | Wt 150.4 lb

## 2024-01-14 DIAGNOSIS — E785 Hyperlipidemia, unspecified: Secondary | ICD-10-CM | POA: Diagnosis not present

## 2024-01-14 DIAGNOSIS — F0390 Unspecified dementia without behavioral disturbance: Secondary | ICD-10-CM

## 2024-01-14 DIAGNOSIS — I213 ST elevation (STEMI) myocardial infarction of unspecified site: Secondary | ICD-10-CM | POA: Diagnosis not present

## 2024-01-14 DIAGNOSIS — I471 Supraventricular tachycardia, unspecified: Secondary | ICD-10-CM

## 2024-01-14 DIAGNOSIS — F32A Depression, unspecified: Secondary | ICD-10-CM | POA: Diagnosis not present

## 2024-01-14 DIAGNOSIS — I2489 Other forms of acute ischemic heart disease: Secondary | ICD-10-CM

## 2024-01-14 DIAGNOSIS — Z09 Encounter for follow-up examination after completed treatment for conditions other than malignant neoplasm: Secondary | ICD-10-CM | POA: Diagnosis not present

## 2024-01-14 DIAGNOSIS — R7989 Other specified abnormal findings of blood chemistry: Secondary | ICD-10-CM

## 2024-01-14 DIAGNOSIS — E1165 Type 2 diabetes mellitus with hyperglycemia: Secondary | ICD-10-CM

## 2024-01-14 DIAGNOSIS — E039 Hypothyroidism, unspecified: Secondary | ICD-10-CM

## 2024-01-14 MED ORDER — SERTRALINE HCL 100 MG PO TABS: 150.0000 mg | ORAL_TABLET | Freq: Every day | ORAL | 0 refills | Status: DC

## 2024-01-14 NOTE — Progress Notes (Signed)
 Do you have anything that would make her happy, Patient score a 17 on GAD7 and a 9 on the PHQ-9

## 2024-01-14 NOTE — Progress Notes (Signed)
 Patient ID: Jenny Cardenas, female    DOB: 10-Jan-1951  MRN: [id number]  CC: Hospital Discharge Follow-Up  Subjective: Jenny Cardenas is a 73 y.o. female who presents for hospital discharge follow-up. She is accompanied by her daughter.  Her concerns today include:  12/29/2023 - 12/30/2023 [facility] per MD note:  Recommendations at discharge:     Close follow up with cardiologist    Discharge Diagnoses: Principal Problem:   SVT (supraventricular tachycardia) (HCC)   Resolved Problems:   * No resolved hospital problems. *   Assessment and Plan: 1-non-STEMI type II Demand ischemia Elevated troponins SVT Presents with near syncope and SVT. Received a dose of Adenosine by EMS  -LDL 133, troponin 34 subsequently to 45 -Cardiology consulted -Started on Metoprolol .  -ECHO: stable per cardio.  Ok to discharge home today.      Hypothyroidism -Continue with Synthroid  - TSH 2.5, free T41.4   Depression: -Continue with Zoloft , Wellbutrin  Resume trazodone  at bedtime   Dementia: Continue with rivastigmine    HLD;  -Continue Pravastatin .    Diabetes type 2: Carb Modified diet.  SSI  Today's office visit 01/14/2024: Patient feeling improved since recent hospital discharge. Denies red flag symptoms. Doing well on medication regimen, no issues/concerns. Anxiety depression persisting and states "wants a medication to make me happy". Patient denies thoughts of self-harm, suicidal ideations, homicidal ideations. Daughter has question about whether it is ok for patient to drive. Daughter states a doctor at the hospital told her that patient cannot drive at this time due to heart medication. Daughter states patient has driven since hospital discharge because patient has to take her mother to get her hair done weekly. I discussed with patient and patient's daughter patient should not drive until medically cleared from Cardiology. Patient and patient's daughter verbalized  understanding. Patient has an upcoming appointment with Cardiology. No further issues/concerns for discussion today.  Patient Active Problem List   Diagnosis Date Noted   SVT (supraventricular tachycardia) (HCC) 12/29/2023   Anxiety 10/03/2022   Attention deficit disorder (ADD) without hyperactivity 10/03/2022   Collapsed vertebra, not elsewhere classified, site unspecified, initial encounter for fracture (HCC) 10/03/2022   Coronary artery disease (CAD) excluded 10/03/2022   Estrogen deficiency 10/03/2022   Herpes simplex viral infection 10/03/2022   History of adenomatous polyp of colon 10/03/2022   Moderate episode of recurrent major depressive disorder (HCC) 10/03/2022   Vitamin B12 deficiency 10/03/2022   Type 2 diabetes mellitus with other specified complication (HCC) 10/03/2022   Type 2 diabetes mellitus without complications (HCC) 10/03/2022   UTI (urinary tract infection) 09/22/2022   Hypokalemia 09/22/2022   Acute metabolic encephalopathy 09/22/2022   Weakness 09/22/2022   Osteoporosis 09/22/2022   Prolonged QT interval 09/22/2022   Primary adenocarcinoma of upper lobe of right lung (HCC) 09/19/2021   Right lower lobe pulmonary nodule 06/29/2021   Pulmonary nodule 1 cm or greater in diameter 04/07/2021   Calcaneal fracture 07/14/2019   Fall 07/13/2019   Type 2 diabetes mellitus with obesity (HCC) 07/13/2019   Closed right calcaneal fracture 07/13/2019   Acute pyelonephritis 10/01/2018   Acute diverticulitis 10/01/2018   Primary osteoarthritis of both hands 04/26/2017   History of vertebral fracture s/p kyphoplasty  04/26/2017   Hyperthyroidism 09/28/2013   Multinodular goiter 09/28/2013   Rash and nonspecific skin eruption 01/31/2013   Rash and other nonspecific skin eruption 10/16/2010   DIAB W/OTH MANIFESTS TYPE II/UNS TYPE UNCNTRL 04/10/2010   OSTEOPENIA 08/27/2008   UNS ADVRS EFF  UNS RX MEDICINAL&BIOLOGICAL SBSTNC 09/18/2007   ACTINIC KERATOSIS 03/04/2007    Anxiety and depression 02/13/2007   Hyperlipidemia 12/25/2006   OBESITY 12/25/2006   Essential hypertension 12/25/2006     Current Outpatient Medications on File Prior to Visit  Medication Sig Dispense Refill   albuterol  (PROVENTIL ) (2.5 MG/3ML) 0.083% nebulizer solution Take 3 mLs (2.5 mg total) by nebulization every 4 (four) hours as needed for wheezing or shortness of breath. 75 mL 10   albuterol  (VENTOLIN  HFA) 108 (90 Base) MCG/ACT inhaler Inhale 1-2 puffs into the lungs every 6 (six) hours as needed for wheezing or shortness of breath. This is a one-time refill from me until the patient establish care with her primary care physician.  Please do not send request for refill to me in the future. 8 g 0   buPROPion  (WELLBUTRIN  XL) 300 MG 24 hr tablet Take 1 tablet (300 mg total) by mouth daily. (Patient taking differently: Take 300 mg by mouth daily with lunch.) 90 tablet 0   cyanocobalamin  (VITAMIN B12) 1000 MCG tablet Take 1 tablet (1,000 mcg total) by mouth daily. 90 tablet 3   ibuprofen  (ADVIL ) 200 MG tablet Take 200 mg by mouth every 8 (eight) hours as needed (pain).     levocetirizine (XYZAL ) 5 MG tablet Take 5 mg by mouth daily as needed for allergies.     levothyroxine  (SYNTHROID ) 25 MCG tablet Take 1 tablet (25 mcg total) by mouth daily before breakfast. 90 tablet 3   lidocaine  (LIDODERM ) 5 % Place 1 patch onto the skin daily. Remove & Discard patch within 12 hours or as directed by MD 90 patch 3   metoprolol  succinate (TOPROL -XL) 25 MG 24 hr tablet Take 1 tablet (25 mg total) by mouth daily. 30 tablet 2   Multiple Vitamins-Minerals (PRESERVISION AREDS 2 PO) Take 1 capsule by mouth in the morning and at bedtime.     pantoprazole  (PROTONIX ) 40 MG tablet Take 1 tablet (40 mg total) by mouth daily. 90 tablet 0   pravastatin  (PRAVACHOL ) 10 MG tablet Take 1 tablet (10 mg total) by mouth daily. (Patient taking differently: Take 10 mg by mouth daily with lunch.) 90 tablet 0   PRESCRIPTION  MEDICATION Natrol memory complex daily     Probiotic Product (PROBIOTIC DAILY PO) Take by mouth daily.     rivastigmine  (EXELON ) 1.5 MG capsule Take 1 capsule (1.5 mg total) by mouth 2 (two) times daily. 60 capsule 11   traZODone  (DESYREL ) 100 MG tablet TAKE 1 TABLET AT BEDTIME 90 tablet 3   No current facility-administered medications on file prior to visit.    Allergies  Allergen Reactions   Ethylene Oxide Hives, Itching and Other (See Comments)    Other reaction(s): scarred cornea, temporary blindness    Social History   Socioeconomic History   Marital status: Widowed    Spouse name: Not on file   Number of children: 2   Years of education: Not on file   Highest education level: Master's degree (e.g., MA, MS, MEng, MEd, MSW, MBA)  Occupational History   Not on file  Tobacco Use   Smoking status: Former    Current packs/day: 0.00    Average packs/day: 0.5 packs/day for 4.0 years (2.0 ttl pk-yrs)    Types: Cigarettes    Start date: 06/28/2012    Quit date: 06/28/2016    Years since quitting: 7.5   Smokeless tobacco: Never  Vaping Use   Vaping status: Never Used  Substance and Sexual Activity  Alcohol  use: Not Currently    Comment: rare glass of wine (holidays only)   Drug use: Never   Sexual activity: Not Currently  Other Topics Concern   Not on file  Social History Narrative   Not on file   Social Drivers of Health   Financial Resource Strain: Low Risk  (06/04/2023)   Overall Financial Resource Strain (CARDIA)    Difficulty of Paying Living Expenses: Not hard at all  Food Insecurity: No Food Insecurity (12/29/2023)   Hunger Vital Sign    Worried About Running Out of Food in the Last Year: Never true    Ran Out of Food in the Last Year: Never true  Transportation Needs: No Transportation Needs (12/29/2023)   PRAPARE - Administrator, Civil Service (Medical): No    Lack of Transportation (Non-Medical): No  Physical Activity: Sufficiently Active  (06/04/2023)   Exercise Vital Sign    Days of Exercise per Week: 6 days    Minutes of Exercise per Session: 60 min  Recent Concern: Physical Activity - Inactive (06/04/2023)   Exercise Vital Sign    Days of Exercise per Week: 0 days    Minutes of Exercise per Session: 0 min  Stress: No Stress Concern Present (06/04/2023)   [employer] of Occupational Health - Occupational Stress Questionnaire    Feeling of Stress : Only a little  Recent Concern: Stress - Stress Concern Present (06/04/2023)   [employer] of Occupational Health - Occupational Stress Questionnaire    Feeling of Stress : To some extent  Social Connections: Unknown (12/29/2023)   Social Connection and Isolation Panel [NHANES]    Frequency of Communication with Friends and Family: More than three times a week    Frequency of Social Gatherings with Friends and Family: More than three times a week    Attends Religious Services: More than 4 times per year    Active Member of [employer] or Organizations: No    Attends Banker Meetings: Patient declined    Marital Status: Patient declined  Intimate Partner Violence: Not At Risk (12/29/2023)   Humiliation, Afraid, Rape, and Kick questionnaire    Fear of Current or Ex-Partner: No    Emotionally Abused: No    Physically Abused: No    Sexually Abused: No    Family History  Problem Relation Age of Onset   COPD Mother    Depression Mother    Hypertension Mother    Hyperlipidemia Mother    Breast cancer Mother 52   Diverticulosis Mother    Heart disease Father    Dementia Father    Breast cancer Sister 52   Breast cancer Sister 94 - 85   Non-Hodgkin's lymphoma Sister    Colon cancer Neg Hx     Past Surgical History:  Procedure Laterality Date   ABDOMINAL HYSTERECTOMY  1996   BRONCHIAL BRUSHINGS  06/29/2021   Procedure: BRONCHIAL BRUSHINGS;  Surgeon: Denson Flake, MD;  Location: [facility];  Service: Pulmonary;;   BRONCHIAL NEEDLE ASPIRATION  BIOPSY  06/29/2021   Procedure: BRONCHIAL NEEDLE ASPIRATION BIOPSIES;  Surgeon: Denson Flake, MD;  Location: [facility];  Service: Pulmonary;;   CATARACT EXTRACTION, BILATERAL     CESAREAN SECTION  1976, 1983, 1984   COLONOSCOPY W/ POLYPECTOMY     EYE SURGERY Bilateral 2014   cataratact and lasik surgery   FIDUCIAL MARKER PLACEMENT  06/29/2021   Procedure: FIDUCIAL DYE MARKING;  Surgeon: Denson Flake, MD;  Location:  [facility];  Service: Pulmonary;;   INTERCOSTAL NERVE BLOCK  06/29/2021   Procedure: INTERCOSTAL NERVE BLOCK;  Surgeon: Hilarie Lovely, MD;  Location: [facility] OR;  Service: Thoracic;;   KYPHOPLASTY N/A 06/02/2015   Procedure: KYPHOPLASTY;  Surgeon: Virl Grimes, MD;  Location: [facility] OR;  Service: Orthopedics;  Laterality: N/A;  Thoracic 3, 8, 10 kyphoplasty   LOBECTOMY  06/29/2021   Procedure: RIGHT UPPER LOBECTOMY;  Surgeon: Hilarie Lovely, MD;  Location: [facility] OR;  Service: Thoracic;;   LYMPH NODE DISSECTION  06/29/2021   Procedure: LYMPH NODE DISSECTION;  Surgeon: Hilarie Lovely, MD;  Location: [facility] OR;  Service: Thoracic;;    ROS: Review of Systems Negative except as stated above  PHYSICAL EXAM: BP 114/65   Pulse 62   Temp 99 F (37.2 C) (Oral)   Resp 16   Ht 5' (1.524 m)   Wt 150 lb 6.4 oz (68.2 kg)   SpO2 95%   BMI 29.37 kg/m   Physical Exam HENT:     Head: Normocephalic and atraumatic.     Nose: Nose normal.     Mouth/Throat:     Mouth: Mucous membranes are moist.     Pharynx: Oropharynx is clear.  Eyes:     Extraocular Movements: Extraocular movements intact.     Conjunctiva/sclera: Conjunctivae normal.     Pupils: Pupils are equal, round, and reactive to light.  Cardiovascular:     Rate and Rhythm: Normal rate and regular rhythm.     Pulses: Normal pulses.     Heart sounds: Normal heart sounds.  Pulmonary:     Effort: Pulmonary effort is normal.     Breath sounds: Normal breath sounds.  Musculoskeletal:        General: Normal  range of motion.     Cervical back: Normal range of motion and neck supple.  Neurological:     General: No focal deficit present.     Mental Status: She is alert and oriented to person, place, and time.  Psychiatric:        Mood and Affect: Mood normal.        Behavior: Behavior normal.      ASSESSMENT AND PLAN: 1. Hospital discharge follow-up (Primary) - Reviewed hospital course, current medications, ensured proper follow-up in place, and addressed concerns.   2. SVT (supraventricular tachycardia) (HCC) 3. Demand ischemia (HCC) 4. Elevated troponin 5. ST elevation myocardial infarction (STEMI), unspecified artery (HCC) - Patient today in office with no cardiopulmonary/acute distress.  - Continue present management.  - Keep all scheduled appointments with Cardiology.   6. Type 2 diabetes mellitus with hyperglycemia, without long-term current use of insulin  (HCC) - Diet controlled.  - Patient declined hemoglobin A1c on today.  - Discussed the importance of healthy eating habits, low-carbohydrate diet, low-sugar diet, and regular aerobic exercise (at least 150 minutes a week as tolerated) to achieve or maintain control of diabetes. - Follow-up with primary provider in 4 weeks or sooner if needed.   7. Hyperlipidemia, unspecified hyperlipidemia type - Continue present management.  - Follow-up with primary provider as scheduled.  8. Hypothyroidism, unspecified type - Continue present management.  - Follow-up with primary provider as scheduled.  9. Depression, unspecified depression type - Patient denies thoughts of self-harm, suicidal ideations, homicidal ideations. - Increase Sertraline  from 100 mg to 150 mg as prescribed. Counseled on medication adherence/adverse effects.  - Continue remaining regimen as prescribed.  - Follow-up with primary provider in 4 weeks or sooner if  needed.  - sertraline  (ZOLOFT ) 100 MG tablet; Take 1.5 tablets (150 mg total) by mouth daily.  Dispense:  135 tablet; Refill: 0  10. Dementia, unspecified dementia severity, unspecified dementia type, unspecified whether behavioral, psychotic, or mood disturbance or anxiety (HCC) - Continue present management.  - Follow-up with primary provider as scheduled.    Patient was given the opportunity to ask questions.  Patient verbalized understanding of the plan and was able to repeat key elements of the plan. Patient was given clear instructions to go to Emergency Department or return to medical center if symptoms don't improve, worsen, or new problems develop.The patient verbalized understanding.   Requested Prescriptions   Signed Prescriptions Disp Refills   sertraline  (ZOLOFT ) 100 MG tablet 135 tablet 0    Sig: Take 1.5 tablets (150 mg total) by mouth daily.    Return for Follow-Up or next available with Abraham Abo, MD.  Senaida Dama, NP

## 2024-01-28 ENCOUNTER — Ambulatory Visit (HOSPITAL_BASED_OUTPATIENT_CLINIC_OR_DEPARTMENT_OTHER): Admitting: Family

## 2024-01-28 VITALS — BP 114/56 | HR 61 | Ht 60.0 in | Wt 149.0 lb

## 2024-01-28 DIAGNOSIS — Z636 Dependent relative needing care at home: Secondary | ICD-10-CM | POA: Diagnosis not present

## 2024-01-28 DIAGNOSIS — I471 Supraventricular tachycardia, unspecified: Secondary | ICD-10-CM | POA: Diagnosis not present

## 2024-01-28 MED ORDER — METOPROLOL SUCCINATE ER 25 MG PO TB24: 25.0000 mg | ORAL_TABLET | Freq: Every day | ORAL | 3 refills | Status: AC

## 2024-01-28 NOTE — Patient Instructions (Signed)
 Medication Instructions:  Your physician recommends that you continue on your current medications as directed. Please refer to the Current Medication list given to you today.   Follow-Up:  Your next appointment:   6 month(s)  Provider:   Neomi Banks, NP {If no MD populates, click here to update Cardiologist or EP   DO NOT delete brackets or number around this link

## 2024-01-28 NOTE — Progress Notes (Signed)
 " Cardiology Office Note   Date:  01/28/2024  ID:  MACARIA BIAS, DOB 1951/07/15, MRN [id number] PCP: Tanda Bleacher, MD  [facility] Providers Cardiologist:  None     History of Present Illness PHENIX GREIN is a 73 y.o. female with hx of hypertension, hyperlipidemia, diabetes type 2, hypothyroidism, lung cancer status post right upper lobectomy, depression.   Admitted 5/18-5/19/25 after presenting with SVT rate 230s requiring 5mg  of adenosine. Troponin elevated due to demand ischemia. Metoprolol  initiated. Echo normal LVEF 55-60%, mild MR/AI.  Discussed the use of AI scribe software for clinical note transcription with the patient, who gave verbal consent to proceed.  History of Present Illness MEILIN BROSH is a 73 year old female with supraventricular tachycardia. She is accompanied by her daughter.  She was recently hospitalized for supraventricular tachycardia and experiences low energy levels since discharge. She wakes up tired and feels fatigued more easily than usual. Her sleep is disrupted, waking up three times a night due to nocturia and an active mind. She takes trazodone  nightly, which aids in falling asleep but not in maintaining sleep.  She experiences episodes of shortness of breath, particularly with activity, which she attributes to previous lung surgery. This is overall unchanged from prior. She is currently taking metoprolol  25 mg and has not experienced fast heart rates since discharge but feels anxious and hyper at times.  Dizziness occurs, especially with quick positional changes, which she associates with low baseline blood pressure. She experiences lightheadedness when moving quickly, particularly when attending to her mother's needs, and tries to stay hydrated. She lives with her 62 year old mother and acts as her caregiver which does contribute to some stress.  ROS: Please see the history of present illness.    All other systems reviewed and are negative.    Studies Reviewed      Cardiac Studies & Procedures   ______________________________________________________________________________________________     ECHOCARDIOGRAM  ECHOCARDIOGRAM COMPLETE 12/30/2023  Narrative ECHOCARDIOGRAM REPORT    Patient Name:   CAYLI ESCAJEDA Date of Exam: 12/30/2023 Medical Rec #:  [id number]     Height:       60.0 in Accession #:    [id number]    Weight:       150.8 lb Date of Birth:  1951-07-18     BSA:          1.656 m Patient Age:    72 years      BP:           101/59 mmHg Patient Gender: F             HR:           63 bpm. Exam Location:  Inpatient  Procedure: 2D Echo, Color Doppler and Cardiac Doppler (Both Spectral and Color Flow Doppler were utilized during procedure).  Indications:    R55 Syncope  History:        Patient has no prior history of Echocardiogram examinations. Risk Factors:Hypertension.  Sonographer:    Eva Lash Referring Phys: [id number] MARSHA ADA  IMPRESSIONS   1. Left ventricular ejection fraction, by estimation, is 55 to 60%. The left ventricle has normal function. The left ventricle has no regional wall motion abnormalities. Left ventricular diastolic parameters were normal. 2. Right ventricular systolic function is normal. The right ventricular size is normal. 3. The mitral valve is normal in structure. Mild mitral valve regurgitation. No evidence of mitral stenosis. 4. The aortic valve is tricuspid. Aortic  valve regurgitation is mild. No aortic stenosis is present. 5. The inferior vena cava is normal in size with greater than 50% respiratory variability, suggesting right atrial pressure of 3 mmHg.  Comparison(s): No prior Echocardiogram.  FINDINGS Left Ventricle: Left ventricular ejection fraction, by estimation, is 55 to 60%. The left ventricle has normal function. The left ventricle has no regional wall motion abnormalities. Definity  contrast agent was given IV to delineate the left ventricular endocardial  borders. The left ventricular internal cavity size was normal in size. There is no left ventricular hypertrophy. Left ventricular diastolic parameters were normal.  Right Ventricle: The right ventricular size is normal. Right ventricular systolic function is normal.  Left Atrium: Left atrial size was normal in size.  Right Atrium: Right atrial size was normal in size.  Pericardium: There is no evidence of pericardial effusion.  Mitral Valve: The mitral valve is normal in structure. Mild mitral valve regurgitation. No evidence of mitral valve stenosis.  Tricuspid Valve: The tricuspid valve is normal in structure. Tricuspid valve regurgitation is trivial. No evidence of tricuspid stenosis.  Aortic Valve: The aortic valve is tricuspid. Aortic valve regurgitation is mild. No aortic stenosis is present. Aortic valve mean gradient measures 3.0 mmHg. Aortic valve peak gradient measures 5.9 mmHg. Aortic valve area, by VTI measures 1.60 cm.  Pulmonic Valve: The pulmonic valve was normal in structure. Pulmonic valve regurgitation is not visualized. No evidence of pulmonic stenosis.  Aorta: The aortic root is normal in size and structure.  Venous: The inferior vena cava is normal in size with greater than 50% respiratory variability, suggesting right atrial pressure of 3 mmHg.  IAS/Shunts: No atrial level shunt detected by color flow Doppler.   LEFT VENTRICLE PLAX 2D LVIDd:         4.30 cm      Diastology LVIDs:         2.70 cm      LV e' medial:    8.86 cm/s LV PW:         1.00 cm      LV E/e' medial:  8.8 LV IVS:        0.90 cm      LV e' lateral:   8.86 cm/s LVOT diam:     1.60 cm      LV E/e' lateral: 8.8 LV SV:         40 LV SV Index:   24 LVOT Area:     2.01 cm  LV Volumes (MOD) LV vol d, MOD A2C: 120.0 ml LV vol d, MOD A4C: 116.0 ml LV vol s, MOD A2C: 29.9 ml LV vol s, MOD A4C: 32.7 ml LV SV MOD A2C:     90.1 ml LV SV MOD A4C:     116.0 ml LV SV MOD BP:      93.5 ml  RIGHT  VENTRICLE RV S prime:     12.30 cm/s TAPSE (M-mode): 1.8 cm  LEFT ATRIUM             Index LA diam:        3.30 cm 1.99 cm/m LA Vol (A2C):   30.7 ml 18.54 ml/m LA Vol (A4C):   37.6 ml 22.71 ml/m LA Biplane Vol: 35.8 ml 21.62 ml/m AORTIC VALVE AV Area (Vmax):    1.45 cm AV Area (Vmean):   1.40 cm AV Area (VTI):     1.60 cm AV Vmax:           121.00 cm/s AV  Vmean:          73.900 cm/s AV VTI:            0.247 m AV Peak Grad:      5.9 mmHg AV Mean Grad:      3.0 mmHg LVOT Vmax:         87.50 cm/s LVOT Vmean:        51.500 cm/s LVOT VTI:          0.197 m LVOT/AV VTI ratio: 0.80  AORTA Ao Asc diam: 3.30 cm  MITRAL VALVE               TRICUSPID VALVE MV Area (PHT): 4.46 cm    TR Peak grad:   13.8 mmHg MV Decel Time: 170 msec    TR Vmax:        186.00 cm/s MV E velocity: 78.20 cm/s MV A velocity: 65.60 cm/s  SHUNTS MV E/A ratio:  1.19        Systemic VTI:  0.20 m Systemic Diam: 1.60 cm  Redell Shallow MD Electronically signed by Redell Shallow MD Signature Date/Time: 12/30/2023/1:28:55 PM    Final          ______________________________________________________________________________________________      Risk Assessment/Calculations           Physical Exam VS:  BP (!) 114/56 (BP Location: Right Arm)   Pulse 61   Ht 5' (1.524 m)   Wt 149 lb (67.6 kg)   SpO2 96%   BMI 29.10 kg/m    Wt Readings from Last 3 Encounters:  01/28/24 149 lb (67.6 kg)  01/14/24 150 lb 6.4 oz (68.2 kg)  12/29/23 150 lb 12.8 oz (68.4 kg)    GEN: Well nourished, well developed in no acute distress NECK: No JVD; No carotid bruits CARDIAC: RRR, no murmurs, rubs, gallops RESPIRATORY:  Clear to auscultation without rales, wheezing or rhonchi  ABDOMEN: Soft, non-tender, non-distended EXTREMITIES:  No edema; No deformity   ASSESSMENT AND PLAN  Assessment and Plan Assessment & Plan Supraventricular tachycardia (SVT) SVT managed with metoprolol  25 mg daily. Discussed heart  monitor to differentiate SVT from anxiety-related symptoms.  - Continue metoprolol  succinate 25 mg daily. - Offered heart monitor if symptoms suggestive of SVT recur; can be mailed or applied in-office. - Reassured regarding the safety and efficacy of metoprolol  for long-term use.  Orthostatic hypotension Dizziness and lightheadedness likely due to orthostatic hypotension. Blood pressure is on the lower end of normal. Explained orthostatic hypotension occurs with rapid positional changes. - Advise on hydration and regular meals to maintain blood pressure. - Recommend wearing compression socks during the day to prevent blood pressure drops.  Sleep disturbances Difficulty sleeping, waking multiple times at night, possibly due to caregiving responsibilities. Discussed caregiving stress impact on sleep quality. - Refer to social worker for potential resources to alleviate caregiving burden. - Continue trazodone  as prescribed per PCP          Dispo: follow up in 6 months  Signed, Reche GORMAN Finder, NP   "

## 2024-02-03 ENCOUNTER — Encounter (HOSPITAL_BASED_OUTPATIENT_CLINIC_OR_DEPARTMENT_OTHER): Payer: Self-pay | Admitting: Family

## 2024-02-06 ENCOUNTER — Telehealth: Payer: Self-pay | Admitting: Licensed Clinical Social Worker

## 2024-02-06 NOTE — Telephone Encounter (Signed)
 H&V Care Navigation CSW Progress Note  Clinical Social Worker contacted patient by phone to f/u on referral for caregiver assistance and to complete assessment if interested. No answer at [phone]. Left voicemail. Will re-attempt again as able.  Patient is participating in a Managed Medicaid Plan:  No, [employer] Medicare only  SDOH Screenings   Food Insecurity: No Food Insecurity (12/29/2023)  Housing: Low Risk  (12/29/2023)  Transportation Needs: No Transportation Needs (12/29/2023)  Utilities: Not At Risk (12/29/2023)  Alcohol  Screen: Low Risk  (06/04/2023)  Depression (PHQ2-9): Medium Risk (01/14/2024)  Financial Resource Strain: Low Risk  (06/04/2023)  Physical Activity: Sufficiently Active (06/04/2023)  Recent Concern: Physical Activity - Inactive (06/04/2023)  Social Connections: Unknown (12/29/2023)  Stress: No Stress Concern Present (06/04/2023)  Recent Concern: Stress - Stress Concern Present (06/04/2023)  Tobacco Use: Medium Risk (02/03/2024)  Health Literacy: Adequate Health Literacy (06/04/2023)    Marit Lark, MSW, LCSW Clinical Social Worker II [facility] Heart/Vascular Care Navigation  [phone]- work cell phone (preferred)

## 2024-02-07 ENCOUNTER — Telehealth: Payer: Self-pay | Admitting: Licensed Clinical Social Worker

## 2024-02-07 NOTE — Progress Notes (Unsigned)
 Heart and Vascular Care Navigation  02/07/2024  Jenny Cardenas 1951/03/29 [id number]  Reason for Referral: community resources; caregiver support Patient is participating in a Managed Medicaid Plan: No, [employer] Medicare   Engaged with patient by telephone for initial visit for Heart and Vascular Care Coordination.                                                                                                   Assessment:                                     Pt transferred to me by [facility] call center. LCSW notes that number message was left on is [name]. Per pt her home number is [phone]. LCSW introduced self, role, reason for call. Pt confirms home address and PCP. Resides with her mother who is 43. She shares that mother still able to do majority of daily cares, pt is responsible for most of the meal prep and household chores. She feels these have become more difficult since her recent hospital stay. She does have supportive adult children and nieces. She has transportation and access to her medications and food. Is interested in resources that I could send should they need to engage any additional assistance at home- discussed unless she or her mother have Medicaid these would be primarily private pay.   No additional questions, encouraged her or her family to call me back as needed for any additional resources or questions.  HRT/VAS Care Coordination     Patients Home Cardiology Office --  [facility]   Outpatient Care Team Social Worker   Social Worker Name: Marit Lark, [place], [phone]   Living arrangements for the past 2 months Single Family Home   Lives with: Parents   Patient Current Insurance Coverage Managed Medicare   Patient Has Concern With Paying Medical Bills No   Does Patient Have Prescription Coverage? Yes   Home Assistive Devices/Equipment Eyeglasses   DME Agency [employer]   [facility] Agency [facility]       Social History:                                                                              SDOH Screenings   Food Insecurity: No Food Insecurity (02/07/2024)  Housing: Low Risk  (02/07/2024)  Transportation Needs: No Transportation Needs (02/07/2024)  Utilities: Not At Risk (02/07/2024)  Alcohol  Screen: Low Risk  (06/04/2023)  Depression (PHQ2-9): Medium Risk (01/14/2024)  Financial Resource Strain: Low Risk  (02/07/2024)  Physical Activity: Sufficiently Active (06/04/2023)  Recent Concern: Physical Activity - Inactive (06/04/2023)  Social Connections: Unknown (12/29/2023)  Stress: No Stress Concern Present (02/07/2024)  Tobacco Use: Medium Risk (02/03/2024)  Health Literacy:  Adequate Health Literacy (02/07/2024)    SDOH Interventions: Financial Resources:  Financial Strain Interventions: Intervention Not Indicated   Food Insecurity:  Food Insecurity Interventions: Intervention Not Indicated  Housing Insecurity:  Housing Interventions: Intervention Not Indicated  Transportation:   Transportation Interventions: Intervention Not Indicated, Patient Resources (Friends/Family)    Other Care Navigation Interventions:     Provided Pharmacy assistance resources  Pt denies any issues obtaing/affording medications; just has some challenge managing both her and her mothers multiple medications at times  Patient expressed Mental Health concerns No.   Follow-up plan:   LCSW will send pt information about [employer] as they have many programs for caregivers including respite if needed and options counseling. Will also mail my card and information about in home aides if needed moving forward. Discussed without Medicaid these are generally private pay. Previous call was to [name] per contacts, remain available should pt /pt family have any questions or be interested in any additional resources.

## 2024-02-17 ENCOUNTER — Ambulatory Visit (INDEPENDENT_AMBULATORY_CARE_PROVIDER_SITE_OTHER): Admitting: Family Medicine

## 2024-02-17 ENCOUNTER — Encounter: Payer: Self-pay | Admitting: Family Medicine

## 2024-02-17 VITALS — BP 111/70 | HR 64 | Wt 149.2 lb

## 2024-02-17 DIAGNOSIS — F32A Depression, unspecified: Secondary | ICD-10-CM | POA: Diagnosis not present

## 2024-02-17 DIAGNOSIS — E785 Hyperlipidemia, unspecified: Secondary | ICD-10-CM

## 2024-02-17 DIAGNOSIS — K219 Gastro-esophageal reflux disease without esophagitis: Secondary | ICD-10-CM | POA: Diagnosis not present

## 2024-02-17 DIAGNOSIS — E1169 Type 2 diabetes mellitus with other specified complication: Secondary | ICD-10-CM

## 2024-02-17 DIAGNOSIS — G4700 Insomnia, unspecified: Secondary | ICD-10-CM | POA: Diagnosis not present

## 2024-02-17 DIAGNOSIS — E039 Hypothyroidism, unspecified: Secondary | ICD-10-CM

## 2024-02-17 DIAGNOSIS — Z794 Long term (current) use of insulin: Secondary | ICD-10-CM | POA: Diagnosis not present

## 2024-02-17 LAB — POCT GLYCOSYLATED HEMOGLOBIN (HGB A1C): HbA1c, POC (controlled diabetic range): 5.8 % (ref 0.0–7.0)

## 2024-02-17 MED ORDER — PANTOPRAZOLE SODIUM 40 MG PO TBEC
40.0000 mg | DELAYED_RELEASE_TABLET | Freq: Every day | ORAL | 1 refills | Status: DC
Start: 2024-02-17 — End: 2024-07-06

## 2024-02-17 MED ORDER — PRAVASTATIN SODIUM 10 MG PO TABS: 10.0000 mg | ORAL_TABLET | Freq: Every day | ORAL | 1 refills | Status: DC

## 2024-02-17 MED ORDER — BUPROPION HCL ER (XL) 300 MG PO TB24: 300.0000 mg | ORAL_TABLET | Freq: Every day | ORAL | 1 refills | Status: DC

## 2024-02-17 MED ORDER — SERTRALINE HCL 100 MG PO TABS: 150.0000 mg | ORAL_TABLET | Freq: Every day | ORAL | 1 refills | Status: DC

## 2024-02-17 NOTE — Progress Notes (Signed)
 Established Patient Office Visit  Subjective    Patient ID: Jenny Cardenas, female    DOB: 06-23-1951  Age: 73 y.o. MRN: [id number]  CC:  Chief Complaint  Patient presents with   Medical Management of Chronic Issues    Lab work   Follow up on medication and depression     HPI Jenny Cardenas presents for follow up of depression. Patient reports much improved with increase of zoloft . She would like to continue   Outpatient Encounter Medications as of 02/17/2024  Medication Sig   albuterol  (PROVENTIL ) (2.5 MG/3ML) 0.083% nebulizer solution Take 3 mLs (2.5 mg total) by nebulization every 4 (four) hours as needed for wheezing or shortness of breath.   albuterol  (VENTOLIN  HFA) 108 (90 Base) MCG/ACT inhaler Inhale 1-2 puffs into the lungs every 6 (six) hours as needed for wheezing or shortness of breath. This is a one-time refill from me until the patient establish care with her primary care physician.  Please do not send request for refill to me in the future.   cyanocobalamin  (VITAMIN B12) 1000 MCG tablet Take 1 tablet (1,000 mcg total) by mouth daily.   ibuprofen  (ADVIL ) 200 MG tablet Take 200 mg by mouth every 8 (eight) hours as needed (pain).   levocetirizine (XYZAL ) 5 MG tablet Take 5 mg by mouth daily as needed for allergies.   levothyroxine  (SYNTHROID ) 25 MCG tablet Take 1 tablet (25 mcg total) by mouth daily before breakfast.   lidocaine  (LIDODERM ) 5 % Place 1 patch onto the skin daily. Remove & Discard patch within 12 hours or as directed by MD   metoprolol  succinate (TOPROL -XL) 25 MG 24 hr tablet Take 1 tablet (25 mg total) by mouth daily.   Multiple Vitamins-Minerals (PRESERVISION AREDS 2 PO) Take 1 capsule by mouth in the morning and at bedtime.   PRESCRIPTION MEDICATION Natrol memory complex daily   Probiotic Product (PROBIOTIC DAILY PO) Take by mouth daily.   rivastigmine  (EXELON ) 1.5 MG capsule Take 1 capsule (1.5 mg total) by mouth 2 (two) times daily.   traZODone  (DESYREL )  100 MG tablet TAKE 1 TABLET AT BEDTIME   [DISCONTINUED] buPROPion  (WELLBUTRIN  XL) 300 MG 24 hr tablet Take 1 tablet (300 mg total) by mouth daily. (Patient taking differently: Take 300 mg by mouth daily with lunch.)   [DISCONTINUED] pantoprazole  (PROTONIX ) 40 MG tablet Take 1 tablet (40 mg total) by mouth daily.   [DISCONTINUED] pravastatin  (PRAVACHOL ) 10 MG tablet Take 1 tablet (10 mg total) by mouth daily. (Patient taking differently: Take 10 mg by mouth daily with lunch.)   [DISCONTINUED] sertraline  (ZOLOFT ) 100 MG tablet Take 1.5 tablets (150 mg total) by mouth daily.   buPROPion  (WELLBUTRIN  XL) 300 MG 24 hr tablet Take 1 tablet (300 mg total) by mouth daily.   pantoprazole  (PROTONIX ) 40 MG tablet Take 1 tablet (40 mg total) by mouth daily.   pravastatin  (PRAVACHOL ) 10 MG tablet Take 1 tablet (10 mg total) by mouth daily.   sertraline  (ZOLOFT ) 100 MG tablet Take 1.5 tablets (150 mg total) by mouth daily.   No facility-administered encounter medications on file as of 02/17/2024.    Past Medical History:  Diagnosis Date   ADHD (attention deficit hyperactivity disorder) 2020   ADD   Anxiety    Arthritis    Broken heart syndrome 2013   Carpal tunnel syndrome    Cataract    Chronic kidney disease    Dementia (HCC)    Depression    Diabetes mellitus  without complication (HCC)    Type 2   Dysrhythmia 1976   tachycardia   Fall 04/10/2015   GERD (gastroesophageal reflux disease)    Goiter    History of shingles    On abdomen, left ring finger, and left leg; Pt takes Valtrex    Hyperlipidemia    Hypertension    Hypothyroidism    lung ca 04/2021   Obesity    PTSD (post-traumatic stress disorder)    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   Shortness of breath dyspnea    pt stated its related to the back problems she has   Thyroid  disease    Ulcer     Past Surgical History:  Procedure Laterality Date   ABDOMINAL HYSTERECTOMY  1996   BRONCHIAL BRUSHINGS  06/29/2021   Procedure:  BRONCHIAL BRUSHINGS;  Surgeon: Shelah Lamar RAMAN, MD;  Location: [facility] ENDOSCOPY;  Service: Pulmonary;;   BRONCHIAL NEEDLE ASPIRATION BIOPSY  06/29/2021   Procedure: BRONCHIAL NEEDLE ASPIRATION BIOPSIES;  Surgeon: Shelah Lamar RAMAN, MD;  Location: [facility] ENDOSCOPY;  Service: Pulmonary;;   CATARACT EXTRACTION, BILATERAL     CESAREAN SECTION  1976, 1983, 1984   COLONOSCOPY W/ POLYPECTOMY     EYE SURGERY Bilateral 2014   cataratact and lasik surgery   FIDUCIAL MARKER PLACEMENT  06/29/2021   Procedure: FIDUCIAL DYE MARKING;  Surgeon: Shelah Lamar RAMAN, MD;  Location: [facility] ENDOSCOPY;  Service: Pulmonary;;   INTERCOSTAL NERVE BLOCK  06/29/2021   Procedure: INTERCOSTAL NERVE BLOCK;  Surgeon: Shyrl Linnie KIDD, MD;  Location: [facility] OR;  Service: Thoracic;;   KYPHOPLASTY N/A 06/02/2015   Procedure: KYPHOPLASTY;  Surgeon: Oneil Priestly, MD;  Location: [facility] OR;  Service: Orthopedics;  Laterality: N/A;  Thoracic 3, 8, 10 kyphoplasty   LOBECTOMY  06/29/2021   Procedure: RIGHT UPPER LOBECTOMY;  Surgeon: Shyrl Linnie KIDD, MD;  Location: [facility] OR;  Service: Thoracic;;   LYMPH NODE DISSECTION  06/29/2021   Procedure: LYMPH NODE DISSECTION;  Surgeon: Shyrl Linnie KIDD, MD;  Location: [facility] OR;  Service: Thoracic;;    Family History  Problem Relation Age of Onset   COPD Mother    Depression Mother    Hypertension Mother    Hyperlipidemia Mother    Breast cancer Mother 17   Diverticulosis Mother    Heart disease Father    Dementia Father    Breast cancer Sister 80   Breast cancer Sister 29 - 5   Non-Hodgkin's lymphoma Sister    Colon cancer Neg Hx     Social History   Socioeconomic History   Marital status: Widowed    Spouse name: Not on file   Number of children: 2   Years of education: Not on file   Highest education level: Master's degree (e.g., MA, MS, MEng, MEd, MSW, MBA)  Occupational History   Not on file  Tobacco Use   Smoking status: Former    Current packs/day: 0.00    Average packs/day: 0.5  packs/day for 4.0 years (2.0 ttl pk-yrs)    Types: Cigarettes    Start date: 06/28/2012    Quit date: 06/28/2016    Years since quitting: 7.6   Smokeless tobacco: Never  Vaping Use   Vaping status: Never Used  Substance and Sexual Activity   Alcohol  use: Not Currently    Comment: rare glass of wine (holidays only)   Drug use: Never   Sexual activity: Not Currently  Other Topics Concern   Not on file  Social History Narrative  Not on file   Social Drivers of Health   Financial Resource Strain: Low Risk  (02/07/2024)   Overall Financial Resource Strain (CARDIA)    Difficulty of Paying Living Expenses: Not very hard  Food Insecurity: No Food Insecurity (02/07/2024)   Hunger Vital Sign    Worried About Running Out of Food in the Last Year: Never true    Ran Out of Food in the Last Year: Never true  Transportation Needs: No Transportation Needs (02/07/2024)   PRAPARE - Administrator, Civil Service (Medical): No    Lack of Transportation (Non-Medical): No  Physical Activity: Sufficiently Active (06/04/2023)   Exercise Vital Sign    Days of Exercise per Week: 6 days    Minutes of Exercise per Session: 60 min  Recent Concern: Physical Activity - Inactive (06/04/2023)   Exercise Vital Sign    Days of Exercise per Week: 0 days    Minutes of Exercise per Session: 0 min  Stress: No Stress Concern Present (02/07/2024)   [employer] of Occupational Health - Occupational Stress Questionnaire    Feeling of Stress: Only a little  Social Connections: Unknown (12/29/2023)   Social Connection and Isolation Panel    Frequency of Communication with Friends and Family: More than three times a week    Frequency of Social Gatherings with Friends and Family: More than three times a week    Attends Religious Services: More than 4 times per year    Active Member of [employer] or Organizations: No    Attends Banker Meetings: Patient declined    Marital Status: Patient  declined  Intimate Partner Violence: Not At Risk (12/29/2023)   Humiliation, Afraid, Rape, and Kick questionnaire    Fear of Current or Ex-Partner: No    Emotionally Abused: No    Physically Abused: No    Sexually Abused: No    Review of Systems  All other systems reviewed and are negative.       Objective    BP 111/70   Pulse 64   Wt 149 lb 3.2 oz (67.7 kg)   SpO2 95%   BMI 29.14 kg/m   Physical Exam Vitals and nursing note reviewed.  Constitutional:      General: She is not in acute distress. Cardiovascular:     Rate and Rhythm: Normal rate and regular rhythm.  Pulmonary:     Effort: Pulmonary effort is normal.     Breath sounds: Normal breath sounds.  Abdominal:     Palpations: Abdomen is soft.     Tenderness: There is no abdominal tenderness.  Neurological:     General: No focal deficit present.     Mental Status: She is alert and oriented to person, place, and time.  Psychiatric:        Mood and Affect: Mood normal.        Behavior: Behavior normal.         Assessment & Plan:   1. Depression, unspecified depression type (Primary) Improved. Continue  - sertraline  (ZOLOFT ) 100 MG tablet; Take 1.5 tablets (150 mg total) by mouth daily.  Dispense: 135 tablet; Refill: 1  2. Type 2 diabetes mellitus with other specified complication, with long-term current use of insulin  (HCC) A1c wnl. Continue  - HgB A1c  3. Hypothyroidism, unspecified type Appears stable. Continue   4. Hyperlipidemia, unspecified hyperlipidemia type Continue   5. Gastroesophageal reflux disease without esophagitis Appears stable. Continue  - pantoprazole  (PROTONIX ) 40 MG tablet; Take  1 tablet (40 mg total) by mouth daily.  Dispense: 90 tablet; Refill: 1  6. Insomnia, unspecified type Continue     Return in about 6 months (around 08/19/2024).   Tanda Raguel SQUIBB, MD

## 2024-02-19 ENCOUNTER — Telehealth: Payer: Self-pay | Admitting: Family Medicine

## 2024-02-19 NOTE — Telephone Encounter (Unsigned)
 Copied from CRM [id number]. Topic: Clinical - Medical Advice >> Feb 19, 2024  4:12 PM [name] wrote: Reason for CRM: Patient is asking to be contacted by her pcp nurse for questions about [place] and Heart Vascular. Patient do not want to see anyone else other than her pcp Dr. Tanda.  Patient believes that she spoke with Robin.  Patient is asking for a call back at [phone].

## 2024-02-27 DIAGNOSIS — L821 Other seborrheic keratosis: Secondary | ICD-10-CM | POA: Diagnosis not present

## 2024-02-27 DIAGNOSIS — D225 Melanocytic nevi of trunk: Secondary | ICD-10-CM | POA: Diagnosis not present

## 2024-02-27 DIAGNOSIS — L814 Other melanin hyperpigmentation: Secondary | ICD-10-CM | POA: Diagnosis not present

## 2024-02-27 NOTE — Progress Notes (Signed)
 [facility] - Clinic Note  03/04/2024    CHIEF COMPLAINT Patient presents for Retina Follow Up   HISTORY OF PRESENT ILLNESS: Jenny Cardenas is a 73 y.o. female who presents to the clinic today for:   HPI     Retina Follow Up   Patient presents with  Dry AMD.  In both eyes.  This started 9 months ago.  I, the attending physician,  performed the HPI with the patient and updated documentation appropriately.        Comments   Pt states she has had some difficulty with her eyes working together, especially while watching TV. Pt states both of her eyelids feel tender. Pt is using ATS every day but eyes seem more gummy.      Last edited by Valdemar Rogue, MD on 03/08/2024 11:16 PM.    Patient states VA seems a tad worse.   Referring physician: Tanda Bleacher, MD [address] [place],  [place] [address]  HISTORICAL INFORMATION:   Selected notes from the MEDICAL RECORD NUMBER Referred by Dr. Ladora for concern of macular degeneration OU   CURRENT MEDICATIONS: No current outpatient medications on file. (Ophthalmic Drugs)   No current facility-administered medications for this visit. (Ophthalmic Drugs)   Current Outpatient Medications (Other)  Medication Sig   albuterol  (PROVENTIL ) (2.5 MG/3ML) 0.083% nebulizer solution Take 3 mLs (2.5 mg total) by nebulization every 4 (four) hours as needed for wheezing or shortness of breath.   albuterol  (VENTOLIN  HFA) 108 (90 Base) MCG/ACT inhaler Inhale 1-2 puffs into the lungs every 6 (six) hours as needed for wheezing or shortness of breath. This is a one-time refill from me until the patient establish care with her primary care physician.  Please do not send request for refill to me in the future.   buPROPion  (WELLBUTRIN  XL) 300 MG 24 hr tablet Take 1 tablet (300 mg total) by mouth daily.   cyanocobalamin  (VITAMIN B12) 1000 MCG tablet Take 1 tablet (1,000 mcg total) by mouth daily.   ibuprofen  (ADVIL ) 200 MG tablet  Take 200 mg by mouth every 8 (eight) hours as needed (pain).   levocetirizine (XYZAL ) 5 MG tablet Take 5 mg by mouth daily as needed for allergies.   levothyroxine  (SYNTHROID ) 25 MCG tablet Take 1 tablet (25 mcg total) by mouth daily before breakfast.   lidocaine  (LIDODERM ) 5 % Place 1 patch onto the skin daily. Remove & Discard patch within 12 hours or as directed by MD   metoprolol  succinate (TOPROL -XL) 25 MG 24 hr tablet Take 1 tablet (25 mg total) by mouth daily.   Multiple Vitamins-Minerals (PRESERVISION AREDS 2 PO) Take 1 capsule by mouth in the morning and at bedtime.   pantoprazole  (PROTONIX ) 40 MG tablet Take 1 tablet (40 mg total) by mouth daily.   pravastatin  (PRAVACHOL ) 10 MG tablet Take 1 tablet (10 mg total) by mouth daily.   PRESCRIPTION MEDICATION Natrol memory complex daily   Probiotic Product (PROBIOTIC DAILY PO) Take by mouth daily.   rivastigmine  (EXELON ) 1.5 MG capsule Take 1 capsule (1.5 mg total) by mouth 2 (two) times daily.   sertraline  (ZOLOFT ) 100 MG tablet Take 1.5 tablets (150 mg total) by mouth daily.   traZODone  (DESYREL ) 100 MG tablet TAKE 1 TABLET AT BEDTIME   No current facility-administered medications for this visit. (Other)   REVIEW OF SYSTEMS: ROS   Positive for: Gastrointestinal, Musculoskeletal, Endocrine, Eyes, Psychiatric Negative for: Constitutional, Neurological, Skin, Genitourinary, HENT, Cardiovascular, Respiratory,  Allergic/Imm, Heme/Lymph Last edited by Elnor Avelina RAMAN, COT on 03/04/2024  2:01 PM.      ALLERGIES Allergies  Allergen Reactions   Ethylene Oxide Hives, Itching and Other (See Comments)    Other reaction(s): scarred cornea, temporary blindness   PAST MEDICAL HISTORY Past Medical History:  Diagnosis Date   ADHD (attention deficit hyperactivity disorder) 2020   ADD   Anxiety    Arthritis    Broken heart syndrome 2013   Carpal tunnel syndrome    Cataract    Chronic kidney disease    Dementia (HCC)    Depression     Diabetes mellitus without complication (HCC)    Type 2   Dysrhythmia 1976   tachycardia   Fall 04/10/2015   GERD (gastroesophageal reflux disease)    Goiter    History of shingles    On abdomen, left ring finger, and left leg; Pt takes Valtrex    Hyperlipidemia    Hypertension    Hypothyroidism    lung ca 04/2021   Obesity    PTSD (post-traumatic stress disorder)    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   Shortness of breath dyspnea    pt stated its related to the back problems she has   Thyroid  disease    Ulcer    Past Surgical History:  Procedure Laterality Date   ABDOMINAL HYSTERECTOMY  1996   BRONCHIAL BRUSHINGS  06/29/2021   Procedure: BRONCHIAL BRUSHINGS;  Surgeon: Shelah Lamar RAMAN, MD;  Location: [facility] ENDOSCOPY;  Service: Pulmonary;;   BRONCHIAL NEEDLE ASPIRATION BIOPSY  06/29/2021   Procedure: BRONCHIAL NEEDLE ASPIRATION BIOPSIES;  Surgeon: Shelah Lamar RAMAN, MD;  Location: [facility] ENDOSCOPY;  Service: Pulmonary;;   CATARACT EXTRACTION, BILATERAL     CESAREAN SECTION  1976, 1983, 1984   COLONOSCOPY W/ POLYPECTOMY     EYE SURGERY Bilateral 2014   cataratact and lasik surgery   FIDUCIAL MARKER PLACEMENT  06/29/2021   Procedure: FIDUCIAL DYE MARKING;  Surgeon: Shelah Lamar RAMAN, MD;  Location: [facility] ENDOSCOPY;  Service: Pulmonary;;   INTERCOSTAL NERVE BLOCK  06/29/2021   Procedure: INTERCOSTAL NERVE BLOCK;  Surgeon: Shyrl Linnie KIDD, MD;  Location: [facility] OR;  Service: Thoracic;;   KYPHOPLASTY N/A 06/02/2015   Procedure: KYPHOPLASTY;  Surgeon: Oneil Priestly, MD;  Location: [facility] OR;  Service: Orthopedics;  Laterality: N/A;  Thoracic 3, 8, 10 kyphoplasty   LOBECTOMY  06/29/2021   Procedure: RIGHT UPPER LOBECTOMY;  Surgeon: Shyrl Linnie KIDD, MD;  Location: [facility] OR;  Service: Thoracic;;   LYMPH NODE DISSECTION  06/29/2021   Procedure: LYMPH NODE DISSECTION;  Surgeon: Shyrl Linnie KIDD, MD;  Location: [facility] OR;  Service: Thoracic;;   FAMILY HISTORY Family History  Problem Relation Age of  Onset   COPD Mother    Depression Mother    Hypertension Mother    Hyperlipidemia Mother    Breast cancer Mother 14   Diverticulosis Mother    Heart disease Father    Dementia Father    Breast cancer Sister 22   Breast cancer Sister 52 - 57   Non-Hodgkin's lymphoma Sister    Colon cancer Neg Hx    SOCIAL HISTORY Social History   Tobacco Use   Smoking status: Former    Current packs/day: 0.00    Average packs/day: 0.5 packs/day for 4.0 years (2.0 ttl pk-yrs)    Types: Cigarettes    Start date: 06/28/2012    Quit date: 06/28/2016    Years since quitting: 7.6   Smokeless tobacco:  Never  Vaping Use   Vaping status: Never Used  Substance Use Topics   Alcohol  use: Not Currently    Comment: rare glass of wine (holidays only)   Drug use: Never       OPHTHALMIC EXAM: Base Eye Exam     Visual Acuity (Snellen - Linear)       Right Left   Dist cc 20/30 +1 20/30   Dist ph cc NI NI         Tonometry (Tonopen, 2:08 PM)       Right Left   Pressure 13 14         Pupils       Pupils Dark Light Shape React APD   Right PERRL 3 2 Round Brisk None   Left PERRL 3 2 Round Brisk None         Visual Fields       Left Right    Full Full         Extraocular Movement       Right Left    Full, Ortho Full, Ortho         Neuro/Psych     Oriented x3: Yes   Mood/Affect: Normal         Dilation     Both eyes: 1.0% Mydriacyl, 2.5% Phenylephrine  @ 2:10 PM           Slit Lamp and Fundus Exam     Slit Lamp Exam       Right Left   Lids/Lashes Mild Dermatochalasis Mild Dermatochalasis - upper lid, Meibomian gland dysfunction   Conjunctiva/Sclera White and quiet prominent epi scleral vessels temporally   Cornea Mild arcus, trace Punctate epithelial erosions, well healed cataract wounds, superior / inferior LRIs, barely visible lasik flap, trace tear film debris arcus, trace Punctate epithelial erosions, well healed cataract wounds, superior / inferior  LRIs, well healed lasik flap, mild corneal haze / Salzmann's nodule at 0400, trace tear film debris   Anterior Chamber deep and clear deep and clear   Iris Round and dilated Round and dilated   Lens PC IOL in good position with open PC PC IOL in good position with open PC   Anterior Vitreous Vitreous syneresis, Posterior vitreous detachment, mild vitreous condensations Vitreous syneresis, Posterior vitreous detachment, vitreous condensations         Fundus Exam       Right Left   Disc mild Pallor, Sharp rim, Compact trace Pallor, Sharp rim   C/D Ratio 0.2 0.3   Macula Flat, Good foveal reflex, rare Drusen, mild RPE mottling, No heme or edema Flat, Good foveal reflex, +Drusen, mild RPE mottling, No heme or edema   Vessels mild attenuation, Tortuous mild attenuation, Tortuous   Periphery Attached, mild reticular degeneration, No heme Attached, mild reticular degeneration, No heme           Refraction     Wearing Rx       Sphere Cylinder Axis Add   Right +0.75 +0.75 077 +3.25   Left +0.50 +0.75 113 +3.25    Age: 44 year   Type: Bifocal           IMAGING AND PROCEDURES  Imaging and Procedures for 03/04/2024  OCT, Retina - OU - Both Eyes       Right Eye Quality was good. Central Foveal Thickness: 288. Progression has been stable. Findings include normal foveal contour, no IRF, no SRF, retinal drusen (Rare drusen nasal macula / peripapillary).  Left Eye Quality was good. Central Foveal Thickness: 288. Progression has been stable. Findings include normal foveal contour, no IRF, no SRF, retinal drusen (Rare, focal drusen-- prominent druse IN mac--slightly improved.).   Notes *Images captured and stored on drive  Diagnosis / Impression:  NFP, no IRF/SRF OU rare focal drusen / early non-exu ARMD OU  Clinical management:  See below  Abbreviations: NFP - Normal foveal profile. CME - cystoid macular edema. PED - pigment epithelial detachment. IRF - intraretinal fluid.  SRF - subretinal fluid. EZ - ellipsoid zone. ERM - epiretinal membrane. ORA - outer retinal atrophy. ORT - outer retinal tubulation. SRHM - subretinal hyper-reflective material. IRHM - intraretinal hyper-reflective material            ASSESSMENT/PLAN:   ICD-10-CM   1. Early dry stage nonexudative age-related macular degeneration of both eyes  H35.3131 OCT, Retina - OU - Both Eyes    2. Diabetes mellitus type 2 without retinopathy (HCC)  E11.9     3. Pseudophakia, both eyes  Z96.1     4. S/P LASIK (laser assisted in situ keratomileusis) of both eyes  Z98.890     5. Dry eyes  H04.123      1. Age related macular degeneration, non-exudative, OU  - exam and OCT with rare focal drusen OU -- early stage, stable - The incidence, anatomy, and pathology of dry AMD, risk of progression, and the AREDS and AREDS 2 studies including smoking risks discussed with patient.  - Continue amsler grid monitoring  - f/u 9 months, sooner prn -- DFE, OCT  2. Diabetes mellitus, type 2 without retinopathy  - A1c 5.8 on 07.07.25 - The incidence, risk factors for progression, natural history and treatment options for diabetic retinopathy  were discussed with patient.   - The need for close monitoring of blood glucose, blood pressure, and serum lipids, avoiding cigarette or any type of tobacco, and the need for long term follow up was also discussed with patient. - f/u in 1 year, sooner prn  3. Pseudophakia OU  - s/p CE/IOL OU (Dr. Meridee)  - IOLs in good position, doing well  - monitor  4. History of Lasik  - stable  - mild corneal scarring OS  5. Dry eyes OU  - recommend artificial tears and lubricating ointment as needed  Ophthalmic Meds Ordered this visit:  No orders of the defined types were placed in this encounter.    Return in about 9 months (around 12/03/2024) for non exu ARMD OU, DFE, OCT.  There are no Patient Instructions on file for this visit.  Explained the diagnoses, plan,  and follow up with the patient and they expressed understanding.  Patient expressed understanding of the importance of proper follow up care.   This document serves as a record of services personally performed by Redell JUDITHANN Hans, MD, PhD. It was created on their behalf by Almetta Pesa, an ophthalmic technician. The creation of this record is the provider's dictation and/or activities during the visit.    Electronically signed by: Almetta Pesa, OA, 03/08/24  11:25 PM   Redell JUDITHANN Hans, M.D., Ph.D. Diseases & Surgery of the Retina and Vitreous [facility]  I have reviewed the above documentation for accuracy and completeness, and I agree with the above. Redell JUDITHANN Hans, M.D., Ph.D. 03/08/24 11:27 PM   Abbreviations: M myopia (nearsighted); A astigmatism; H hyperopia (farsighted); P presbyopia; Mrx spectacle prescription;  CTL contact lenses; OD right eye; OS  left eye; OU both eyes  XT exotropia; ET esotropia; PEK punctate epithelial keratitis; PEE punctate epithelial erosions; DES dry eye syndrome; MGD meibomian gland dysfunction; ATs artificial tears; PFAT's preservative free artificial tears; NSC nuclear sclerotic cataract; PSC posterior subcapsular cataract; ERM epi-retinal membrane; PVD posterior vitreous detachment; RD retinal detachment; DM diabetes mellitus; DR diabetic retinopathy; NPDR non-proliferative diabetic retinopathy; PDR proliferative diabetic retinopathy; CSME clinically significant macular edema; DME diabetic macular edema; dbh dot blot hemorrhages; CWS cotton wool spot; POAG primary open angle glaucoma; C/D cup-to-disc ratio; HVF humphrey visual field; GVF goldmann visual field; OCT optical coherence tomography; IOP intraocular pressure; BRVO Branch retinal vein occlusion; CRVO central retinal vein occlusion; CRAO central retinal artery occlusion; BRAO branch retinal artery occlusion; RT retinal tear; SB scleral buckle; PPV pars plana vitrectomy; VH  Vitreous hemorrhage; PRP panretinal laser photocoagulation; IVK intravitreal kenalog; VMT vitreomacular traction; MH Macular hole;  NVD neovascularization of the disc; NVE neovascularization elsewhere; AREDS age related eye disease study; ARMD age related macular degeneration; POAG primary open angle glaucoma; EBMD epithelial/anterior basement membrane dystrophy; ACIOL anterior chamber intraocular lens; IOL intraocular lens; PCIOL posterior chamber intraocular lens; Phaco/IOL phacoemulsification with intraocular lens placement; PRK photorefractive keratectomy; LASIK laser assisted in situ keratomileusis; HTN hypertension; DM diabetes mellitus; COPD chronic obstructive pulmonary disease

## 2024-03-04 ENCOUNTER — Ambulatory Visit (INDEPENDENT_AMBULATORY_CARE_PROVIDER_SITE_OTHER): Payer: Medicare HMO | Admitting: Ophthalmology

## 2024-03-04 ENCOUNTER — Encounter (INDEPENDENT_AMBULATORY_CARE_PROVIDER_SITE_OTHER): Payer: Self-pay | Admitting: Ophthalmology

## 2024-03-04 DIAGNOSIS — H04123 Dry eye syndrome of bilateral lacrimal glands: Secondary | ICD-10-CM | POA: Diagnosis not present

## 2024-03-04 DIAGNOSIS — Z9889 Other specified postprocedural states: Secondary | ICD-10-CM

## 2024-03-04 DIAGNOSIS — E119 Type 2 diabetes mellitus without complications: Secondary | ICD-10-CM

## 2024-03-04 DIAGNOSIS — H353131 Nonexudative age-related macular degeneration, bilateral, early dry stage: Secondary | ICD-10-CM | POA: Diagnosis not present

## 2024-03-04 DIAGNOSIS — Z961 Presence of intraocular lens: Secondary | ICD-10-CM | POA: Diagnosis not present

## 2024-03-08 ENCOUNTER — Encounter (INDEPENDENT_AMBULATORY_CARE_PROVIDER_SITE_OTHER): Payer: Self-pay | Admitting: Ophthalmology

## 2024-03-18 ENCOUNTER — Ambulatory Visit: Admitting: Family Medicine

## 2024-04-15 ENCOUNTER — Ambulatory Visit (HOSPITAL_COMMUNITY)
Admission: RE | Admit: 2024-04-15 | Discharge: 2024-04-15 | Disposition: A | Discharge status: Home / Self Care | Source: Ambulatory Visit | Attending: Physician Assistant | Admitting: Physician Assistant

## 2024-04-15 ENCOUNTER — Inpatient Hospital Stay: Payer: Medicare PPO | Attending: Internal Medicine

## 2024-04-15 DIAGNOSIS — I700 Atherosclerosis of aorta: Secondary | ICD-10-CM | POA: Diagnosis not present

## 2024-04-15 DIAGNOSIS — C3490 Malignant neoplasm of unspecified part of unspecified bronchus or lung: Secondary | ICD-10-CM

## 2024-04-15 DIAGNOSIS — Z85118 Personal history of other malignant neoplasm of bronchus and lung: Secondary | ICD-10-CM | POA: Insufficient documentation

## 2024-04-15 DIAGNOSIS — Z79899 Other long term (current) drug therapy: Secondary | ICD-10-CM | POA: Insufficient documentation

## 2024-04-15 DIAGNOSIS — I471 Supraventricular tachycardia, unspecified: Secondary | ICD-10-CM | POA: Insufficient documentation

## 2024-04-15 DIAGNOSIS — R911 Solitary pulmonary nodule: Secondary | ICD-10-CM | POA: Insufficient documentation

## 2024-04-15 DIAGNOSIS — Z87442 Personal history of urinary calculi: Secondary | ICD-10-CM | POA: Diagnosis not present

## 2024-04-15 DIAGNOSIS — Z902 Acquired absence of lung [part of]: Secondary | ICD-10-CM | POA: Insufficient documentation

## 2024-04-15 DIAGNOSIS — E041 Nontoxic single thyroid nodule: Secondary | ICD-10-CM | POA: Diagnosis not present

## 2024-04-15 LAB — CMP (CANCER CENTER ONLY)
ALT: 8 U/L (ref 0–44)
AST: 13 U/L — ABNORMAL LOW (ref 15–41)
Albumin: 4 g/dL (ref 3.5–5.0)
Alkaline Phosphatase: 87 U/L (ref 38–126)
Anion gap: 6 (ref 5–15)
BUN: 15 mg/dL (ref 8–23)
CO2: 30 mmol/L (ref 22–32)
Calcium: 9.2 mg/dL (ref 8.9–10.3)
Chloride: 105 mmol/L (ref 98–111)
Creatinine: 1.13 mg/dL — ABNORMAL HIGH (ref 0.44–1.00)
GFR, Estimated: 51 mL/min — ABNORMAL LOW (ref >60)
Glucose, Bld: 96 mg/dL (ref 70–99)
Potassium: 4.4 mmol/L (ref 3.5–5.1)
Sodium: 141 mmol/L (ref 135–145)
Total Bilirubin: 0.5 mg/dL (ref 0.0–1.2)
Total Protein: 7 g/dL (ref 6.5–8.1)

## 2024-04-15 LAB — CBC WITH DIFFERENTIAL (CANCER CENTER ONLY)
Abs Immature Granulocytes: 0.02 K/uL (ref 0.00–0.07)
Basophils Absolute: 0 K/uL (ref 0.0–0.1)
Basophils Relative: 1 %
Eosinophils Absolute: 0.1 K/uL (ref 0.0–0.5)
Eosinophils Relative: 2 %
HCT: 39.8 % (ref 36.0–46.0)
Hemoglobin: 12.6 g/dL (ref 12.0–15.0)
Immature Granulocytes: 0 %
Lymphocytes Relative: 28 %
Lymphs Abs: 2.3 K/uL (ref 0.7–4.0)
MCH: 27.7 pg (ref 26.0–34.0)
MCHC: 31.7 g/dL (ref 30.0–36.0)
MCV: 87.5 fL (ref 80.0–100.0)
Monocytes Absolute: 0.5 K/uL (ref 0.1–1.0)
Monocytes Relative: 6 %
Neutro Abs: 5.4 K/uL (ref 1.7–7.7)
Neutrophils Relative %: 63 %
Platelet Count: 265 K/uL (ref 150–400)
RBC: 4.55 MIL/uL (ref 3.87–5.11)
RDW: 13.2 % (ref 11.5–15.5)
WBC Count: 8.4 K/uL (ref 4.0–10.5)
nRBC: 0 % (ref 0.0–0.2)

## 2024-04-15 MED ORDER — IOHEXOL 300 MG/ML  SOLN
75.0000 mL | Freq: Once | INTRAMUSCULAR | Status: AC | PRN
  Administered 2024-04-15: 75 mL via INTRAVENOUS

## 2024-04-21 ENCOUNTER — Telehealth: Payer: Self-pay

## 2024-04-21 NOTE — Telephone Encounter (Signed)
 Received a phone call from patient's daughter regarding the appointment scheduled for tomorrow. She inquired about the possibility of changing the appointment to a telephone visit because the patient is currently caring for her mother, and the individuals who would normally assist with her mother during the appointment are ill. Informed her information will replayed to Dr. Sherrod and a call back will be made.

## 2024-04-22 ENCOUNTER — Inpatient Hospital Stay: Payer: Medicare PPO | Admitting: Internal Medicine

## 2024-04-22 ENCOUNTER — Telehealth: Payer: Self-pay

## 2024-04-22 ENCOUNTER — Encounter: Payer: Self-pay | Admitting: Family Medicine

## 2024-04-22 DIAGNOSIS — C3490 Malignant neoplasm of unspecified part of unspecified bronchus or lung: Secondary | ICD-10-CM

## 2024-04-22 NOTE — Telephone Encounter (Signed)
 Copied from CRM [id number]. Topic: Clinical - Medical Advice >> Apr 22, 2024  3:41 PM Sophia H wrote: Reason for CRM: Patients daughter Silvano states a kidney stone on left side was detected via CT scan that patient had done by another provider, wants to speak with Dr. Luigi nurse # [phone]

## 2024-04-22 NOTE — Telephone Encounter (Signed)
 Spoke with [facility] and confirmed that Dr. Sherrod is okay with a telephone visit today @ 330. She voiced thanks.

## 2024-04-22 NOTE — Progress Notes (Signed)
 [facility] [phone]   [phone]  PROGRESS NOTE FOR TELEMEDICINE VISITS  Jenny Bleacher, MD [address] [place] [address]  I connected withNAME@ on 04/22/24 at  3:30 PM EDT by telephone visit and verified that I am speaking with the correct person using two identifiers.   I discussed the limitations, risks, security and privacy concerns of performing an evaluation and management service by telemedicine and the availability of in-person appointments. I also discussed with the patient that there may be a patient responsible charge related to this service. The patient expressed understanding and agreed to proceed.  Other persons participating in the visit and their role in the encounter:  daughter Jenny Cardenas  Patient's location:  Home Provider's location: [facility]  DIAGNOSIS: Stage IA (T1 a, N0, M0) non-small cell lung cancer, adenocarcinoma    PRIOR THERAPY:  status post right upper lobectomy with lymph node dissection under the care of Dr. Shyrl on June 29, 2021.  The tumor size was 0.8 cm.    CURRENT THERAPY: Observation   INTERVAL HISTORY: Jenny Cardenas 73 y.o. female has a telephone virtual visit with me today for evaluation and discussion of her scan results.Discussed the use of AI scribe software for clinical note transcription with the patient, who gave verbal consent to proceed.  History of Present Illness Jenny Cardenas is a 73 year old female with stage 1A non-small cell lung cancer who presents for evaluation and discussion of recent CT scan results. She is accompanied by her daughter, Jenny Cardenas.  She has a history of stage 1A non-small cell lung cancer, status post right upper lobectomy with lymph node dissection in November 2022. The tumor size was 0.8 cm. Since the surgery, she has been under observation. She had a recent CT scan of the chest, which identified a 4 mm nodule in the right lower lobe, a  5 mm nodule in the right middle lobe, and a 3 mm nodule in the left upper lobe, as previously noted.  Two months ago, she was hospitalized and diagnosed with supraventricular tachycardia (SVT). She is currently taking metoprolol . No new chest pain, shortness of breath, or hemoptysis.  She reports unintentional weight loss and currently weighs 150 pounds.  The recent CT scan also revealed a 11 mm kidney stone. She experiences left-sided pain, which may be related to the kidney stone.    MEDICAL HISTORY: Past Medical History:  Diagnosis Date   ADHD (attention deficit hyperactivity disorder) 2020   ADD   Anxiety    Arthritis    Broken heart syndrome 2013   Carpal tunnel syndrome    Cataract    Chronic kidney disease    Dementia (HCC)    Depression    Diabetes mellitus without complication (HCC)    Type 2   Dysrhythmia 1976   tachycardia   Fall 04/10/2015   GERD (gastroesophageal reflux disease)    Goiter    History of shingles    On abdomen, left ring finger, and left leg; Pt takes Valtrex    Hyperlipidemia    Hypertension    Hypothyroidism    lung ca 04/2021   Obesity    PTSD (post-traumatic stress disorder)    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   Shortness of breath dyspnea    pt stated its related to the back problems she has   Thyroid  disease    Ulcer     ALLERGIES:  is allergic to ethylene  oxide.  MEDICATIONS:  Current Outpatient Medications  Medication Sig Dispense Refill   albuterol  (PROVENTIL ) (2.5 MG/3ML) 0.083% nebulizer solution Take 3 mLs (2.5 mg total) by nebulization every 4 (four) hours as needed for wheezing or shortness of breath. 75 mL 10   albuterol  (VENTOLIN  HFA) 108 (90 Base) MCG/ACT inhaler Inhale 1-2 puffs into the lungs every 6 (six) hours as needed for wheezing or shortness of breath. This is a one-time refill from me until the patient establish care with her primary care physician.  Please do not send request for refill to me in the future. 8 g  0   buPROPion  (WELLBUTRIN  XL) 300 MG 24 hr tablet Take 1 tablet (300 mg total) by mouth daily. 90 tablet 1   cyanocobalamin  (VITAMIN B12) 1000 MCG tablet Take 1 tablet (1,000 mcg total) by mouth daily. 90 tablet 3   ibuprofen  (ADVIL ) 200 MG tablet Take 200 mg by mouth every 8 (eight) hours as needed (pain).     levocetirizine (XYZAL ) 5 MG tablet Take 5 mg by mouth daily as needed for allergies.     levothyroxine  (SYNTHROID ) 25 MCG tablet Take 1 tablet (25 mcg total) by mouth daily before breakfast. 90 tablet 3   lidocaine  (LIDODERM ) 5 % Place 1 patch onto the skin daily. Remove & Discard patch within 12 hours or as directed by MD 90 patch 3   metoprolol  succinate (TOPROL -XL) 25 MG 24 hr tablet Take 1 tablet (25 mg total) by mouth daily. 90 tablet 3   Multiple Vitamins-Minerals (PRESERVISION AREDS 2 PO) Take 1 capsule by mouth in the morning and at bedtime.     pantoprazole  (PROTONIX ) 40 MG tablet Take 1 tablet (40 mg total) by mouth daily. 90 tablet 1   pravastatin  (PRAVACHOL ) 10 MG tablet Take 1 tablet (10 mg total) by mouth daily. 90 tablet 1   PRESCRIPTION MEDICATION Natrol memory complex daily     Probiotic Product (PROBIOTIC DAILY PO) Take by mouth daily.     rivastigmine  (EXELON ) 1.5 MG capsule Take 1 capsule (1.5 mg total) by mouth 2 (two) times daily. 60 capsule 11   sertraline  (ZOLOFT ) 100 MG tablet Take 1.5 tablets (150 mg total) by mouth daily. 135 tablet 1   traZODone  (DESYREL ) 100 MG tablet TAKE 1 TABLET AT BEDTIME 90 tablet 3   No current facility-administered medications for this visit.    SURGICAL HISTORY:  Past Surgical History:  Procedure Laterality Date   ABDOMINAL HYSTERECTOMY  1996   BRONCHIAL BRUSHINGS  06/29/2021   Procedure: BRONCHIAL BRUSHINGS;  Surgeon: Jenny Lamar RAMAN, MD;  Location: [facility] ENDOSCOPY;  Service: Pulmonary;;   BRONCHIAL NEEDLE ASPIRATION BIOPSY  06/29/2021   Procedure: BRONCHIAL NEEDLE ASPIRATION BIOPSIES;  Surgeon: Jenny Lamar RAMAN, MD;  Location: [facility]  ENDOSCOPY;  Service: Pulmonary;;   CATARACT EXTRACTION, BILATERAL     CESAREAN SECTION  1976, 1983, 1984   COLONOSCOPY W/ POLYPECTOMY     EYE SURGERY Bilateral 2014   cataratact and lasik surgery   FIDUCIAL MARKER PLACEMENT  06/29/2021   Procedure: FIDUCIAL DYE MARKING;  Surgeon: Jenny Lamar RAMAN, MD;  Location: [facility] ENDOSCOPY;  Service: Pulmonary;;   INTERCOSTAL NERVE BLOCK  06/29/2021   Procedure: INTERCOSTAL NERVE BLOCK;  Surgeon: Shyrl Linnie KIDD, MD;  Location: [facility] OR;  Service: Thoracic;;   KYPHOPLASTY N/A 06/02/2015   Procedure: KYPHOPLASTY;  Surgeon: Oneil Priestly, MD;  Location: [facility] OR;  Service: Orthopedics;  Laterality: N/A;  Thoracic 3, 8, 10 kyphoplasty   LOBECTOMY  06/29/2021  Procedure: RIGHT UPPER LOBECTOMY;  Surgeon: Shyrl Linnie KIDD, MD;  Location: [facility] OR;  Service: Thoracic;;   LYMPH NODE DISSECTION  06/29/2021   Procedure: LYMPH NODE DISSECTION;  Surgeon: Shyrl Linnie KIDD, MD;  Location: [facility] OR;  Service: Thoracic;;    REVIEW OF SYSTEMS:  A comprehensive review of systems was negative except for: Respiratory: positive for dyspnea on exertion     LABORATORY DATA: Lab Results  Component Value Date   WBC 8.4 04/15/2024   HGB 12.6 04/15/2024   HCT 39.8 04/15/2024   MCV 87.5 04/15/2024   PLT 265 04/15/2024      Chemistry      Component Value Date/Time   NA 141 04/15/2024 1116   NA 141 12/11/2023 1520   K 4.4 04/15/2024 1116   CL 105 04/15/2024 1116   CO2 30 04/15/2024 1116   BUN 15 04/15/2024 1116   BUN 14 12/11/2023 1520   CREATININE 1.13 (H) 04/15/2024 1116   CREATININE 0.83 10/26/2016 1039      Component Value Date/Time   CALCIUM  9.2 04/15/2024 1116   ALKPHOS 87 04/15/2024 1116   AST 13 (L) 04/15/2024 1116   ALT 8 04/15/2024 1116   BILITOT 0.5 04/15/2024 1116       RADIOGRAPHIC STUDIES: CT Chest W Contrast Result Date: 04/17/2024 CLINICAL DATA:  Non-small cell lung cancer, assess treatment response. * Tracking Code: BO * EXAM: CT CHEST  WITH CONTRAST TECHNIQUE: Multidetector CT imaging of the chest was performed during intravenous contrast administration. RADIATION DOSE REDUCTION: This exam was performed according to the departmental dose-optimization program which includes automated exposure control, adjustment of the mA and/or kV according to patient size and/or use of iterative reconstruction technique. CONTRAST:  75mL OMNIPAQUE  IOHEXOL  300 MG/ML  SOLN COMPARISON:  Multiple priors including CT April 22, 2023 FINDINGS: Cardiovascular: Aortic atherosclerosis. Normal caliber central pulmonary arteries. Aortic atherosclerosis. Normal size heart. Mediastinum/Nodes: Hypodense 15 mm nodule in the right lobe of the thyroid  is stable from prior. No pathologically enlarged mediastinal, hilar or axillary lymph nodes. Mild symmetric distal esophageal wall thickening Lungs/Pleura: Surgical changes of prior right upper lobectomy without new suspicious nodularity along the suture line. Stable 4 mm nodule in the right lower lobe lung base on image 109/5. Stable subpleural right middle lobe pulmonary nodule measuring 5 mm on image 38/5 stable 3 mm ground-glass pulmonary nodule in the left upper lobe on image 38/5. No new suspicious pulmonary nodules or masses. Upper Abdomen: Left renal atrophy with pelviectasis, peripelvic stranding an 11 mm stone in the renal pelvis. Too small to accurately characterize hypodense right interpolar renal lesion. Musculoskeletal: Diffuse demineralization of bone. Multilevel vertebral body augmentation at T3, T8 and T10. No aggressive lytic or blastic lesion of bone. IMPRESSION: 1. Surgical changes of prior right upper lobectomy without evidence of local recurrence. 2. Stable small bilateral pulmonary nodules. 3. Left renal atrophy with pelviectasis, peripelvic stranding and an 11 mm stone in the renal pelvis. Consider further evaluation by renal ultrasound. 4. Mild symmetric distal esophageal wall thickening, which may reflect  esophagitis. 5. Aortic atherosclerosis. Aortic Atherosclerosis (ICD10-I70.0). Electronically Signed   By: Reyes Holder M.D.   On: 04/17/2024 15:31    ASSESSMENT AND PLAN: This is a very pleasant 73 years old white female with stage IA (T1 a, N0, M0) non-small cell lung cancer, adenocarcinoma status post right upper lobectomy with lymph node dissection under the care of Dr. Shyrl on June 29, 2021.  The patient is currently on observation and she  is feeling fine. She had repeat CT scan of the chest performed recently.  I personally independently reviewed the scan and discussed the result with the patient and her daughter. HIDA scan showed no concerning findings for disease recurrence or metastasis She has a stable bilateral pulmonary nodules. There was also 11 mm stone in the left renal pelvis. Assessment and Plan Assessment & Plan Stage 1A non-small cell lung cancer, status post right upper lobectomy with lymph node dissection Tumor size was 0.8 cm. She has been on observation since November 2022. Recent CT scan shows stable findings with no new concerning features. - Continue surveillance with annual CT scan of the chest - Advise to call if any new symptoms or concerns arise  Pulmonary nodules Stable pulmonary nodules identified on recent CT scan: 4 mm nodule in the right lower lobe, 5 mm nodule in the right middle lobe, and 3 mm nodule in the left upper lobe. No change in size or appearance, indicating stability. - Continue surveillance with annual CT scan of the chest  Kidney stone Incidental finding of a 11 mm kidney stone on recent CT scan. She reports left-sided pain, which may be related to the kidney stone. Discussion about potential need for urology consultation if symptoms persist or worsen. - Discuss with primary care physician regarding potential referral to a urologist for kidney stone management She was advised to call immediately if she has any concerning symptoms in  the interval. I discussed the assessment and treatment plan with the patient. The patient was provided an opportunity to ask questions and all were answered. The patient agreed with the plan and demonstrated an understanding of the instructions.   The patient was advised to call back or seek an in-person evaluation if the symptoms worsen or if the condition fails to improve as anticipated.  I provided 20 minutes of non face-to-face telephone visit time during this encounter, and > 50% was spent counseling as documented under my assessment & plan.  Sherrod MARLA Sherrod, MD 04/22/2024 3:30 PM  Disclaimer: This note was dictated with voice recognition software. Similar sounding words can inadvertently be transcribed and may not be corrected upon review.

## 2024-04-23 ENCOUNTER — Other Ambulatory Visit: Payer: Self-pay | Admitting: Neurology

## 2024-04-23 ENCOUNTER — Telehealth: Payer: Self-pay | Admitting: Internal Medicine

## 2024-04-23 NOTE — Telephone Encounter (Signed)
 Scheduled patient appointments for next year. Called and spoke with the patient and she is aware.

## 2024-04-24 ENCOUNTER — Other Ambulatory Visit: Payer: Self-pay | Admitting: Family Medicine

## 2024-04-24 DIAGNOSIS — N200 Calculus of kidney: Secondary | ICD-10-CM

## 2024-04-30 ENCOUNTER — Encounter: Payer: Self-pay | Admitting: Family Medicine

## 2024-04-30 ENCOUNTER — Other Ambulatory Visit: Payer: Self-pay | Admitting: Family Medicine

## 2024-04-30 DIAGNOSIS — N200 Calculus of kidney: Secondary | ICD-10-CM

## 2024-04-30 NOTE — Progress Notes (Signed)
 Urology

## 2024-05-12 ENCOUNTER — Encounter: Payer: Self-pay | Admitting: Neurology

## 2024-05-12 ENCOUNTER — Ambulatory Visit: Payer: PRIVATE HEALTH INSURANCE | Admitting: Neurology

## 2024-05-12 VITALS — BP 105/67 | HR 70 | Resp 15 | Ht 61.0 in | Wt 152.0 lb

## 2024-05-12 DIAGNOSIS — F02A0 Dementia in other diseases classified elsewhere, mild, without behavioral disturbance, psychotic disturbance, mood disturbance, and anxiety: Secondary | ICD-10-CM | POA: Diagnosis not present

## 2024-05-12 DIAGNOSIS — G301 Alzheimer's disease with late onset: Secondary | ICD-10-CM

## 2024-05-12 MED ORDER — VITAMIN B-12 1000 MCG PO TABS: 1000.0000 ug | ORAL_TABLET | Freq: Every day | ORAL | 3 refills | Status: AC

## 2024-05-12 MED ORDER — RIVASTIGMINE TARTRATE 3 MG PO CAPS: 3.0000 mg | ORAL_CAPSULE | Freq: Two times a day (BID) | ORAL | 3 refills | Status: AC

## 2024-05-12 NOTE — Progress Notes (Signed)
 [facility]  PATIENT: Jenny Cardenas DOB: 1951-03-30  REQUESTING CLINICIAN: Tanda Bleacher, MD HISTORY FROM: Patient, daughter  REASON FOR VISIT: Memory loss    HISTORICAL  CHIEF COMPLAINT:  Chief Complaint  Patient presents with   Memory Loss    Rm12, daughter present, Memory loss: pt daughter reported that pt has 11mm kidney stone. Moca was unable to be completed. Mmse was 19   INTERVAL HISTORY 05/12/2024 Patient presents today for follow-up, she is accompanied by her daughter.  Last visit was a year ago.  At that time we obtained ATN profile which was positive for Alzheimer disease biomarker.  Started her on Exelon  1.5 mg twice daily and she tolerates the medication very well.  She is still independent in all in all ADLs, sometimes gets lost while driving in [place].  Denies any worsening of her memory, no hallucinations, any difficulty with her ADLs and no other complaints. Fells that she is stable.  She tells me she was recently diagnosed with this SVT, cardiology did put on metoprolol .  During her lung screening, she was noted to have a 10 mm left-sided kidney stone.  She has a follow-up with urology coming up soon.   HISTORY OF PRESENT ILLNESS:  This is a 73 year old woman past medical history of hypothyroidism, hyperlipidemia, history of lung cancer status post right upper lobectomy, anxiety and depression who is presenting with memory loss for the past few years.  Patient lives with her 72 year old mother and takes care of her.  She reports memory problem described mainly as word finding difficulty, she is forgetful, sometimes need reminders and worried about developing dementia like her father.  Daughter tells me that her memory is getting worse, she is forgetful, needing reminders, back in the beginning of the year she was making mistakes with her medication, and in February she was admitted to the hospital for altered mental status.  At that time she was  found to have hypokalemia in the setting of taking daily Lasix .  Since discharge from the hospital, it seems like patient is getting better.  She is now compliant with her medications, does have a reminder on her phone, she completes her grocery shopping, still drives, denies any recent accident or getting lost in familiar places but back in January, February she had episode where it was difficult with directions.    TBI:  No past history of TBI Stroke:   no past history of stroke Seizures:   no past history of seizures Sleep:   no history of sleep apnea Mood:  Yes, on Wellbutrin  and Zoloft  Family history of Dementia: Frontotemporal dementia   Functional status: Patient lives with 74 year old mother. Cooking: yes Cleaning: yes  Shopping: yes  Bathing: yes no issues  Toileting: no issues  Driving: Driving, got lost but prior to her hospital admission;, no other episode since her recent admission  Bills: no issues   Ever left the stove on by accident?: Denies  Forget how to use items around the house?: Denies Getting lost going to familiar places?: Denies  Forgetting loved ones names?: Denies  Word finding difficulty? Yes  Sleep: Not very well    OTHER MEDICAL CONDITIONS: Hyperlipidemia, Hypothyroidism, History of lung Ca s/p surgery    REVIEW OF SYSTEMS: Full 14 system review of systems performed and negative with exception of: As noted in the HPI   ALLERGIES: Allergies  Allergen Reactions   Ethylene Oxide Hives, Itching and Other (See Comments)  Other reaction(s): scarred cornea, temporary blindness    HOME MEDICATIONS: Outpatient Medications Prior to Visit  Medication Sig Dispense Refill   albuterol  (PROVENTIL ) (2.5 MG/3ML) 0.083% nebulizer solution Take 3 mLs (2.5 mg total) by nebulization every 4 (four) hours as needed for wheezing or shortness of breath. 75 mL 10   albuterol  (VENTOLIN  HFA) 108 (90 Base) MCG/ACT inhaler Inhale 1-2 puffs into the lungs every 6 (six)  hours as needed for wheezing or shortness of breath. This is a one-time refill from me until the patient establish care with her primary care physician.  Please do not send request for refill to me in the future. 8 g 0   buPROPion  (WELLBUTRIN  XL) 300 MG 24 hr tablet Take 1 tablet (300 mg total) by mouth daily. 90 tablet 1   ibuprofen  (ADVIL ) 200 MG tablet Take 200 mg by mouth every 8 (eight) hours as needed (pain).     levocetirizine (XYZAL ) 5 MG tablet Take 5 mg by mouth daily as needed for allergies.     levothyroxine  (SYNTHROID ) 25 MCG tablet Take 1 tablet (25 mcg total) by mouth daily before breakfast. 90 tablet 3   lidocaine  (LIDODERM ) 5 % Place 1 patch onto the skin daily. Remove & Discard patch within 12 hours or as directed by MD 90 patch 3   metoprolol  succinate (TOPROL -XL) 25 MG 24 hr tablet Take 1 tablet (25 mg total) by mouth daily. 90 tablet 3   Multiple Vitamins-Minerals (PRESERVISION AREDS 2 PO) Take 1 capsule by mouth in the morning and at bedtime.     pantoprazole  (PROTONIX ) 40 MG tablet Take 1 tablet (40 mg total) by mouth daily. 90 tablet 1   pravastatin  (PRAVACHOL ) 10 MG tablet Take 1 tablet (10 mg total) by mouth daily. 90 tablet 1   PRESCRIPTION MEDICATION Natrol memory complex daily     Probiotic Product (PROBIOTIC DAILY PO) Take by mouth daily.     sertraline  (ZOLOFT ) 100 MG tablet Take 1.5 tablets (150 mg total) by mouth daily. 135 tablet 1   traZODone  (DESYREL ) 100 MG tablet TAKE 1 TABLET AT BEDTIME 90 tablet 3   cyanocobalamin  (VITAMIN B12) 1000 MCG tablet TAKE 1 TABLET (1000 MCG TOTAL) BY MOUTH DAILY. 90 tablet 3   rivastigmine  (EXELON ) 1.5 MG capsule TAKE 1 CAPSULE TWICE DAILY 180 capsule 3   No facility-administered medications prior to visit.    PAST MEDICAL HISTORY: Past Medical History:  Diagnosis Date   ADHD (attention deficit hyperactivity disorder) 2020   ADD   Anxiety    Arthritis    Broken heart syndrome 2013   Carpal tunnel syndrome    Cataract     Chronic kidney disease    Dementia (HCC)    Depression    Diabetes mellitus without complication (HCC)    Type 2   Dysrhythmia 1976   tachycardia   Fall 04/10/2015   GERD (gastroesophageal reflux disease)    Goiter    History of shingles    On abdomen, left ring finger, and left leg; Pt takes Valtrex    Hyperlipidemia    Hypertension    Hypothyroidism    lung ca 04/2021   Obesity    PTSD (post-traumatic stress disorder)    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   Shortness of breath dyspnea    pt stated its related to the back problems she has   Thyroid  disease    Ulcer     PAST SURGICAL HISTORY: Past Surgical History:  Procedure Laterality  Date   ABDOMINAL HYSTERECTOMY  1996   BRONCHIAL BRUSHINGS  06/29/2021   Procedure: BRONCHIAL BRUSHINGS;  Surgeon: Shelah Lamar RAMAN, MD;  Location: [facility] ENDOSCOPY;  Service: Pulmonary;;   BRONCHIAL NEEDLE ASPIRATION BIOPSY  06/29/2021   Procedure: BRONCHIAL NEEDLE ASPIRATION BIOPSIES;  Surgeon: Shelah Lamar RAMAN, MD;  Location: [facility] ENDOSCOPY;  Service: Pulmonary;;   CATARACT EXTRACTION, BILATERAL     CESAREAN SECTION  1976, 1983, 1984   COLONOSCOPY W/ POLYPECTOMY     EYE SURGERY Bilateral 2014   cataratact and lasik surgery   FIDUCIAL MARKER PLACEMENT  06/29/2021   Procedure: FIDUCIAL DYE MARKING;  Surgeon: Shelah Lamar RAMAN, MD;  Location: [facility] ENDOSCOPY;  Service: Pulmonary;;   INTERCOSTAL NERVE BLOCK  06/29/2021   Procedure: INTERCOSTAL NERVE BLOCK;  Surgeon: Shyrl Linnie KIDD, MD;  Location: [facility] OR;  Service: Thoracic;;   KYPHOPLASTY N/A 06/02/2015   Procedure: KYPHOPLASTY;  Surgeon: Oneil Priestly, MD;  Location: [facility] OR;  Service: Orthopedics;  Laterality: N/A;  Thoracic 3, 8, 10 kyphoplasty   LOBECTOMY  06/29/2021   Procedure: RIGHT UPPER LOBECTOMY;  Surgeon: Shyrl Linnie KIDD, MD;  Location: [facility] OR;  Service: Thoracic;;   LYMPH NODE DISSECTION  06/29/2021   Procedure: LYMPH NODE DISSECTION;  Surgeon: Shyrl Linnie KIDD, MD;  Location: [facility]  OR;  Service: Thoracic;;    FAMILY HISTORY: Family History  Problem Relation Age of Onset   COPD Mother    Depression Mother    Hypertension Mother    Hyperlipidemia Mother    Breast cancer Mother 79   Diverticulosis Mother    Heart disease Father    Dementia Father    Breast cancer Sister 42   Breast cancer Sister 106 - 77   Non-Hodgkin's lymphoma Sister    Colon cancer Neg Hx     SOCIAL HISTORY: Social History   Socioeconomic History   Marital status: Widowed    Spouse name: Not on file   Number of children: 2   Years of education: Not on file   Highest education level: Master's degree (e.g., MA, MS, MEng, MEd, MSW, MBA)  Occupational History   Not on file  Tobacco Use   Smoking status: Former    Current packs/day: 0.00    Average packs/day: 0.5 packs/day for 4.0 years (2.0 ttl pk-yrs)    Types: Cigarettes    Start date: 06/28/2012    Quit date: 06/28/2016    Years since quitting: 7.8   Smokeless tobacco: Never  Vaping Use   Vaping status: Never Used  Substance and Sexual Activity   Alcohol  use: Not Currently    Comment: rare glass of wine (holidays only)   Drug use: Never   Sexual activity: Not Currently  Other Topics Concern   Not on file  Social History Narrative   Not on file   Social Drivers of Health   Financial Resource Strain: Low Risk  (02/07/2024)   Overall Financial Resource Strain (CARDIA)    Difficulty of Paying Living Expenses: Not very hard  Food Insecurity: No Food Insecurity (02/07/2024)   Hunger Vital Sign    Worried About Running Out of Food in the Last Year: Never true    Ran Out of Food in the Last Year: Never true  Transportation Needs: No Transportation Needs (02/07/2024)   PRAPARE - Administrator, Civil Service (Medical): No    Lack of Transportation (Non-Medical): No  Physical Activity: Sufficiently Active (06/04/2023)   Exercise Vital Sign    Days of  Exercise per Week: 6 days    Minutes of Exercise per Session: 60  min  Recent Concern: Physical Activity - Inactive (06/04/2023)   Exercise Vital Sign    Days of Exercise per Week: 0 days    Minutes of Exercise per Session: 0 min  Stress: No Stress Concern Present (02/07/2024)   [employer] of Occupational Health - Occupational Stress Questionnaire    Feeling of Stress: Only a little  Social Connections: Unknown (12/29/2023)   Social Connection and Isolation Panel    Frequency of Communication with Friends and Family: More than three times a week    Frequency of Social Gatherings with Friends and Family: More than three times a week    Attends Religious Services: More than 4 times per year    Active Member of [employer] or Organizations: No    Attends Banker Meetings: Patient declined    Marital Status: Patient declined  Intimate Partner Violence: Not At Risk (12/29/2023)   Humiliation, Afraid, Rape, and Kick questionnaire    Fear of Current or Ex-Partner: No    Emotionally Abused: No    Physically Abused: No    Sexually Abused: No    PHYSICAL EXAM  GENERAL EXAM/CONSTITUTIONAL: Vitals:  Vitals:   05/12/24 1113 05/12/24 1125  BP: (!) 109/52 105/67  Pulse: 69 70  Resp: 15   Weight: 152 lb (68.9 kg)   Height: 5' 1 (1.549 m)     Body mass index is 28.72 kg/m. Wt Readings from Last 3 Encounters:  05/12/24 152 lb (68.9 kg)  02/17/24 149 lb 3.2 oz (67.7 kg)  01/28/24 149 lb (67.6 kg)   Patient is in no distress; well developed, nourished and groomed; neck is supple  MUSCULOSKELETAL: Gait, strength, tone, movements noted in Neurologic exam below  NEUROLOGIC: MENTAL STATUS:     05/12/2024   11:21 AM 03/29/2022    2:36 PM  MMSE - Mini Mental State Exam  Orientation to time 4 5  Orientation to Place 5 5  Registration 3 3  Attention/ Calculation 0 5  Recall 0 3  Language- name 2 objects 2 2  Language- repeat 0 1  Language- follow 3 step command 3 3  Language- read & follow direction 1 1  Write a sentence 1 1  Copy  design 0 1  Total score 19 30      05/13/2023   10:34 AM  Montreal Cognitive Assessment   Visuospatial/ Executive (0/5) 4  Naming (0/3) 3  Attention: Read list of digits (0/2) 2  Attention: Read list of letters (0/1) 1  Attention: Serial 7 subtraction starting at 100 (0/3) 0  Language: Repeat phrase (0/2) 0  Language : Fluency (0/1) 1  Abstraction (0/2) 2  Delayed Recall (0/5) 3  Orientation (0/6) 6  Total 22  Adjusted Score (based on education) 22    CRANIAL NERVE:  2nd, 3rd, 4th, 6th- visual fields full to confrontation, extraocular muscles intact, no nystagmus 5th - facial sensation symmetric 7th - facial strength symmetric 8th - hearing intact 9th - palate elevates symmetrically, uvula midline 11th - shoulder shrug symmetric 12th - tongue protrusion midline  MOTOR:  normal bulk and tone, full strength in the BUE, BLE  SENSORY:  normal and symmetric to light touch  COORDINATION:  finger-nose-finger, fine finger movements normal  GAIT/STATION:  normal   DIAGNOSTIC DATA (LABS, IMAGING, TESTING) - I reviewed patient records, labs, notes, testing and imaging myself where available.  Lab Results  Component Value  Date   WBC 8.4 04/15/2024   HGB 12.6 04/15/2024   HCT 39.8 04/15/2024   MCV 87.5 04/15/2024   PLT 265 04/15/2024      Component Value Date/Time   NA 141 04/15/2024 1116   NA 141 12/11/2023 1520   K 4.4 04/15/2024 1116   CL 105 04/15/2024 1116   CO2 30 04/15/2024 1116   GLUCOSE 96 04/15/2024 1116   BUN 15 04/15/2024 1116   BUN 14 12/11/2023 1520   CREATININE 1.13 (H) 04/15/2024 1116   CREATININE 0.83 10/26/2016 1039   CALCIUM  9.2 04/15/2024 1116   PROT 7.0 04/15/2024 1116   PROT 6.8 12/11/2023 1520   ALBUMIN 4.0 04/15/2024 1116   ALBUMIN 4.2 12/11/2023 1520   AST 13 (L) 04/15/2024 1116   ALT 8 04/15/2024 1116   ALKPHOS 87 04/15/2024 1116   BILITOT 0.5 04/15/2024 1116   GFRNONAA 51 (L) 04/15/2024 1116   GFRAA >60 07/14/2019 0412   Lab  Results  Component Value Date   CHOL 204 (H) 12/11/2023   HDL 42 12/11/2023   LDLCALC 133 (H) 12/11/2023   LDLDIRECT 139.6 07/21/2012   TRIG 162 (H) 12/11/2023   CHOLHDL 4.9 (H) 12/11/2023   Lab Results  Component Value Date   HGBA1C 5.8 02/17/2024   Lab Results  Component Value Date   VITAMINB12 264 09/22/2022   Lab Results  Component Value Date   TSH 2.540 12/11/2023   ATN Profile consistent with presence of Alzheimer dementia pathology   MRI Brain 09/22/2022 1. No acute intracranial abnormality. 2. Mild chronic microvascular ischemic disease for age    ASSESSMENT AND PLAN  73 y.o. year old female with history of lung cancer status post right upper lobectomy, hypothyroidism, hyperlipidemia, anxiety and depression who is presenting for follow-up for her mild Alzheimer dementia.  She is currently on Exelon  1.5 mg twice daily, will increase to 3 mg twice daily.  Will advised her to continue current medications, continue to follow with PCP and return as needed.  They are comfortable with plans.   1. Mild late onset Alzheimer's dementia without behavioral disturbance, psychotic disturbance, mood disturbance, or anxiety (HCC)      Patient Instructions  Increase Rivastigmine  to 3 mg twice daily  Continue with B12 supplement, will provide another refill Continue your other medications Continue follow-up PCP, please inquire if she can refill her Rivastigmine  in the future.  Return if symptoms do get worse  No orders of the defined types were placed in this encounter.   Meds ordered this encounter  Medications   rivastigmine  (EXELON ) 3 MG capsule    Sig: Take 1 capsule (3 mg total) by mouth 2 (two) times daily.    Dispense:  180 capsule    Refill:  3   cyanocobalamin  (VITAMIN B12) 1000 MCG tablet    Sig: Take 1 tablet (1,000 mcg total) by mouth daily.    Dispense:  90 tablet    Refill:  3    Return if symptoms worsen or fail to improve.  I have spent a total of 45  minutes dedicated to this patient today, preparing to see patient, performing a medically appropriate examination and evaluation, ordering tests and/or medications and procedures, and counseling and educating the patient/family/caregiver; independently interpreting result and communicating results to the family/patient/caregiver; and documenting clinical information in the electronic medical record.   Pastor Falling, MD 05/12/2024, 12:54 PM  [facility] [address] [place], [place] [address] [phone]

## 2024-05-12 NOTE — Patient Instructions (Addendum)
 Increase Rivastigmine  to 3 mg twice daily  Continue with B12 supplement, will provide another refill Continue your other medications Continue follow-up PCP, please inquire if she can refill her Rivastigmine  in the future.  Return if symptoms do get worse

## 2024-05-14 DIAGNOSIS — N200 Calculus of kidney: Secondary | ICD-10-CM | POA: Insufficient documentation

## 2024-05-14 NOTE — Progress Notes (Signed)
 05/21/24 1:33 PM   Jenny Cardenas 01/16/1951 [id number]   HPI: 73 y.o. female here for initial evaluation of nephrolithiasis Recent CT Chest for lung Ca surveillance Incidental 11mm Left renal pelvic stone, perinephric stranding, mild pelviectasis  Accompanied by her daughter today First lifetime stone Generally asymptomatic, incidental finding-patient mentions some lower back pain when changing positions Denies recurrent UTI Daughter inquired about UTI today-subacute, several week history of cognitive decline (in the setting of diagnosed dementia Patient denies overt UTI symptoms  UA today-trace LE, 30 RBC, 30 WBC,>10 squamous + Family history of nephrolithiasis    PMH: Past Medical History:  Diagnosis Date   ADHD (attention deficit hyperactivity disorder) 2020   ADD   Anxiety    Arthritis    Broken heart syndrome 2013   Carpal tunnel syndrome    Cataract    Chronic kidney disease    Dementia (HCC)    Depression    Diabetes mellitus without complication (HCC)    Type 2   Dysrhythmia 1976   tachycardia   Fall 04/10/2015   GERD (gastroesophageal reflux disease)    Goiter    History of shingles    On abdomen, left ring finger, and left leg; Pt takes Valtrex    Hyperlipidemia    Hypertension    Hypothyroidism    lung ca 04/2021   Obesity    PTSD (post-traumatic stress disorder)    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   Shortness of breath dyspnea    pt stated its related to the back problems she has   Thyroid  disease    Ulcer     Surgical History: Past Surgical History:  Procedure Laterality Date   ABDOMINAL HYSTERECTOMY  1996   BRONCHIAL BRUSHINGS  06/29/2021   Procedure: BRONCHIAL BRUSHINGS;  Surgeon: Shelah Lamar RAMAN, MD;  Location: [facility] [place] [facility] ENDOSCOPY;  Service: Pulmonary;;   BRONCHIAL NEEDLE ASPIRATION BIOPSY  06/29/2021   Procedure: BRONCHIAL NEEDLE ASPIRATION BIOPSIES;  Surgeon: Shelah Lamar RAMAN, MD;  Location: [facility] ENDOSCOPY;  Service: Pulmonary;;   CATARACT  EXTRACTION, BILATERAL     CESAREAN SECTION  1976, 1983, 1984   COLONOSCOPY W/ POLYPECTOMY     EYE SURGERY Bilateral 2014   cataratact and lasik surgery   FIDUCIAL MARKER PLACEMENT  06/29/2021   Procedure: FIDUCIAL DYE MARKING;  Surgeon: Shelah Lamar RAMAN, MD;  Location: [facility] ENDOSCOPY;  Service: Pulmonary;;   INTERCOSTAL NERVE BLOCK  06/29/2021   Procedure: INTERCOSTAL NERVE BLOCK;  Surgeon: Shyrl Linnie KIDD, MD;  Location: [facility] OR;  Service: Thoracic;;   KYPHOPLASTY N/A 06/02/2015   Procedure: KYPHOPLASTY;  Surgeon: Oneil Priestly, MD;  Location: [facility] OR;  Service: Orthopedics;  Laterality: N/A;  Thoracic 3, 8, 10 kyphoplasty   LOBECTOMY  06/29/2021   Procedure: RIGHT UPPER LOBECTOMY;  Surgeon: Shyrl Linnie KIDD, MD;  Location: [facility] OR;  Service: Thoracic;;   LYMPH NODE DISSECTION  06/29/2021   Procedure: LYMPH NODE DISSECTION;  Surgeon: Shyrl Linnie KIDD, MD;  Location: [facility] OR;  Service: Thoracic;;    Family History: Family History  Problem Relation Age of Onset   COPD Mother    Depression Mother    Hypertension Mother    Hyperlipidemia Mother    Breast cancer Mother 14   Diverticulosis Mother    Heart disease Father    Dementia Father    Breast cancer Sister 1   Breast cancer Sister 63 - 47   Non-Hodgkin's lymphoma Sister    Colon cancer Neg Hx  Social History:  reports that she quit smoking about 7 years ago. Her smoking use included cigarettes. She started smoking about 11 years ago. She has a 2 pack-year smoking history. She has never used smokeless tobacco. She reports that she does not currently use alcohol . She reports that she does not use drugs.      Physical Exam: BP (!) 102/58   Pulse 70   Ht 5' 1 (1.549 m)   Wt 152 lb 12.8 oz (69.3 kg)   BMI 28.87 kg/m    Constitutional:  Alert and oriented, No acute distress. Cardiovascular: No clubbing, cyanosis, or edema. Respiratory: Normal respiratory effort, no increased work of breathing. GI: Nondistended Skin:  No rashes, bruises or suspicious lesions. Neurologic: Grossly intact, no focal deficits, moving all 4 extremities. Psychiatric: Normal mood and affect.  Laboratory Data:  Latest Reference Range & Units 04/15/24 11:16  Creatinine 0.44 - 1.00 mg/dL 8.86 (H)  (H): Data is abnormally high   Pertinent Imaging: I have personally viewed and interpreted the CT Chest (04/15/24, [facility] relevance) - at minimum a 11mm left renal pelvic stone, I anticipate it is actually larger. Only partial visualization due to limits of scan. Left peripelvic stranding, difficult to comment if pathologic pelviectasis or bilateral extrarenal pelvices.     Assessment & Plan:    Nephrolithiasis Assessment & Plan: Incidental 39mm+ Left renal pelvic stone, via CT Chest  I reviewed her clinical history and recent CT imaging. There appears to be a large left renal stone occupying the renal pelvis. Possible longstanding with chronic inflammation w/ left renal cortical thinning. I suspect the stone is larger than 11mm in the long-axis, as chest film is incomplete. Recommend dedicated CT Stone protocol to further evaluate.   - CT Stone, return to clinic to review results. Discuss management plan.  - Reflex urine culture today-would like to maintain higher threshold to treat.  She does not exhibit classic UTI symptoms other than subacute change in mental status in the context of chronic dementia  Orders: -     Urinalysis, Complete -     CULTURE, URINE COMPREHENSIVE -     CT RENAL STONE STUDY; Future      [name], MD 05/21/2024  [facility] [address] [place], [place] [address] [phone]

## 2024-05-14 NOTE — Assessment & Plan Note (Addendum)
 Incidental 21mm+ Left renal pelvic stone, via CT Chest  I reviewed her clinical history and recent CT imaging. There appears to be a large left renal stone occupying the renal pelvis. Possible longstanding with chronic inflammation w/ left renal cortical thinning. I suspect the stone is larger than 11mm in the long-axis, as chest film is incomplete. Recommend dedicated CT Stone protocol to further evaluate.   - CT Stone, return to clinic to review results. Discuss management plan.  - Reflex urine culture today-would like to maintain higher threshold to treat.  She does not exhibit classic UTI symptoms other than subacute change in mental status in the context of chronic dementia

## 2024-05-21 ENCOUNTER — Ambulatory Visit (INDEPENDENT_AMBULATORY_CARE_PROVIDER_SITE_OTHER): Admitting: Urology

## 2024-05-21 VITALS — BP 102/58 | HR 70 | Ht 61.0 in | Wt 152.8 lb

## 2024-05-21 DIAGNOSIS — N200 Calculus of kidney: Secondary | ICD-10-CM | POA: Diagnosis not present

## 2024-05-21 LAB — URINALYSIS, COMPLETE
Bilirubin, UA: NEGATIVE
Glucose, UA: NEGATIVE
Nitrite, UA: POSITIVE — AB
Specific Gravity, UA: 1.03 (ref 1.005–1.030)
Urobilinogen, Ur: 0.2 mg/dL (ref 0.2–1.0)
pH, UA: 6 (ref 5.0–7.5)

## 2024-05-21 LAB — MICROSCOPIC EXAMINATION
Epithelial Cells (non renal): >10 /HPF — AB (ref 0–10)
RBC, Urine: >30 /HPF — AB (ref 0–2)
WBC, UA: >30 /HPF — AB (ref 0–5)

## 2024-05-21 NOTE — Patient Instructions (Signed)
Scheduling [phone] 

## 2024-05-25 ENCOUNTER — Telehealth: Payer: Self-pay | Admitting: Family Medicine

## 2024-05-25 NOTE — Telephone Encounter (Unsigned)
 Copied from CRM [id number]. Topic: Clinical - Refused Triage >> May 25, 2024  7:55 AM [place] MATSU wrote: Patient/caller voiced complaints of a fall yesterday and her kidney stones. Declined transfer to triage.

## 2024-05-25 NOTE — Telephone Encounter (Signed)
 Copied from CRM [id number]. Topic: General - Call Back - No Documentation >> May 25, 2024  7:54 AM [place] MATSU wrote: Reason for CRM:  patient called in stating that she is returning MD call.  May you please assist.

## 2024-05-27 NOTE — Telephone Encounter (Signed)
 Called pt to ask if she wants to come in sooner for appt;  could not reach or leave vm due to full vm box

## 2024-05-28 ENCOUNTER — Ambulatory Visit
Admission: RE | Admit: 2024-05-28 | Discharge: 2024-05-28 | Disposition: A | Discharge status: Home / Self Care | Source: Ambulatory Visit | Attending: Urology | Admitting: Urology

## 2024-05-28 ENCOUNTER — Ambulatory Visit: Payer: Self-pay | Admitting: Urology

## 2024-05-28 DIAGNOSIS — N200 Calculus of kidney: Secondary | ICD-10-CM | POA: Insufficient documentation

## 2024-05-28 DIAGNOSIS — K5730 Diverticulosis of large intestine without perforation or abscess without bleeding: Secondary | ICD-10-CM | POA: Diagnosis not present

## 2024-05-28 LAB — CULTURE, URINE COMPREHENSIVE

## 2024-05-28 MED ORDER — NITROFURANTOIN MONOHYD MACRO 100 MG PO CAPS: 100.0000 mg | ORAL_CAPSULE | Freq: Two times a day (BID) | ORAL | 0 refills | Status: DC

## 2024-05-28 NOTE — Progress Notes (Signed)
 Sending in a 7-day course of Macrobid for E.coli found in urine. I spoke with daughter and patient at last visit- while she does not have classic UTI symptoms, she is having subacute AMS / cognitive decline. Will treat for UTI with culture directed antibiotics and see how symptoms respond. If no change, will refrain from further antibiotic therapy for bacteruria alone.   Can we call and let patient know to begin taking? Thank you

## 2024-05-29 ENCOUNTER — Telehealth: Payer: Self-pay

## 2024-05-29 DIAGNOSIS — N200 Calculus of kidney: Secondary | ICD-10-CM

## 2024-05-29 DIAGNOSIS — R3989 Other symptoms and signs involving the genitourinary system: Secondary | ICD-10-CM

## 2024-05-29 MED ORDER — NITROFURANTOIN MONOHYD MACRO 100 MG PO CAPS: 100.0000 mg | ORAL_CAPSULE | Freq: Two times a day (BID) | ORAL | 0 refills | Status: AC

## 2024-05-29 NOTE — Telephone Encounter (Signed)
 Incoming call from pt's daughter Powell (listed on DPR) who states that they received a mychart message stating the patient needed an antibiotic however the pharmacy does not have the medication. Upon chart review the medication was sent to mail order pharmacy ([facility]). Correct local pharmacy is [facility], RX sent there for Macrobid. [facility] removed from pharmacy list as pt does not use that pharmacy currently.

## 2024-06-05 NOTE — Assessment & Plan Note (Addendum)
 CT Stone (05/21/24) - 2.2 cm Left renal pelvic stone  Recent E. coli UTI Nonspecific lower back pain, Left > Right (hx of multiple fractures, back surgeries)  Reviewed her recent CT stone study which revealed a large 2.2 cm left renal pelvic stone.  Imaging also suggest inflammation of the left renal pelvis.  Considering total volume stone burden, back pain, recent UTI-would recommend definitive stone treatmentwith uroscopy and laser lithotripsy.  She would not be a great candidate for ESWL with this size stone.  I explained the ureteroscopy procedure in detail today, emphasizing the possibility that she may require staged procedures, with temporary ureteral stent placement.  All questions were answered, minimal to proceeding  - Schedule left ureteroscopy, laser lithotripsy, left ureteral stent placement, next available -Reflex urine culture today-will not start antibiotics as she is asymptomatic.  May use culture data for urine sterilization prior to procedure

## 2024-06-05 NOTE — Progress Notes (Signed)
 06/11/2024 2:20 PM   Jenny Cardenas [birth year] [id number]  Reason for visit: Follow up nephrolithiasis   HPI: 73 y.o. female, follow up with me today CT Stone (05/21/24) - 2.2 cm Left renal pelvic stone     Prior HPI: Recent CT Chest for lung Ca surveillance Incidental 11mm Left renal pelvic stone, perinephric stranding, mild pelviectasis   Accompanied by her daughter today First lifetime stone Generally asymptomatic, incidental finding-patient mentions some lower back pain when changing positions Denies recurrent UTI Daughter inquired about UTI today-subacute, several week history of cognitive decline (in the setting of diagnosed dementia Patient denies overt UTI symptoms   UA today-trace LE, 30 RBC, 30 WBC,>10 squamous + Family history of nephrolithiasis    Physical Exam: BP 113/65   Pulse 70   Ht 5' 2 (1.575 m)   Wt 150 lb (68 kg)   SpO2 97%   BMI 27.44 kg/m    Constitutional:  Alert and oriented, No acute distress.  Laboratory Data: Urine Culture, Comprehensive Final report Abnormal   Organism ID, Bacteria Comment Abnormal   Comment: Escherichia coli, identified by an automated biochemical system. Susceptibility profile is consistent with a probable ESBL. Multi-Drug Resistant Organism Greater than 100,000 colony forming units per mL  Organism ID, Bacteria Comment  Comment: Mixed urogenital flora 25,000-50,000 colony forming units per mL  ANTIMICROBIAL SUSCEPTIBILITY Comment  Comment:       ** S = Susceptible; I = Intermediate; R = Resistant **                    P = Positive; N = Negative             MICS are expressed in micrograms per mL    Antibiotic                 RSLT#1    RSLT#2    RSLT#3    RSLT#4 Amoxicillin /Clavulanic Acid    S Ampicillin                     R Cefazolin                       R Cefepime                       S Cefoxitin                      S Cefpodoxime                    R Ceftriaxone                     R Ciprofloxacin                   R Ertapenem                      S Gentamicin                     R Levofloxacin                   R Meropenem                      S Nitrofurantoin                 S Piperacillin/Tazobactam  S Tetracycline                   R Tobramycin                     I Trimethoprim/Sulfa             R    Pertinent Imaging: I have personally viewed and interpreted the CT Stone (05/31/24) -2.2 cm left renal pelvic stone at longest diameter, no hydronephrosis, although perhaps some mild pelviectasis with left peripelvic stranding.  Contralateral right kidney morphologically normal in appearance, no nephrolithiasis.  Bilateral ureters and bladder normal.  IMPRESSION: 16 x 7 mm calculus in left renal pelvis, with mild pelvicaliectasis.   Diffuse wall thickening of the left renal pelvis surrounding this calculus, likely due to pyelitis/urinary tract infection, although urothelial carcinoma cannot be excluded.   Increased left renal parenchymal atrophy.   Punctate nonobstructing right renal calculus.   Colonic diverticulosis, without radiographic evidence of diverticulitis.    Assessment & Plan:    Nephrolithiasis Assessment & Plan: CT Stone (05/21/24) - 2.2 cm Left renal pelvic stone  Recent E. coli UTI Nonspecific lower back pain, Left > Right (hx of multiple fractures, back surgeries)  Reviewed her recent CT stone study which revealed a large 2.2 cm left renal pelvic stone.  Imaging also suggest inflammation of the left renal pelvis.  Considering total volume stone burden, back pain, recent UTI-would recommend definitive stone treatmentwith uroscopy and laser lithotripsy.  She would not be a great candidate for ESWL with this size stone.  I explained the ureteroscopy procedure in detail today, emphasizing the possibility that she may require staged procedures, with temporary ureteral stent placement.  All questions were answered, minimal to proceeding  - Schedule left  ureteroscopy, laser lithotripsy, left ureteral stent placement, next available -Reflex urine culture today-will not start antibiotics as she is asymptomatic.  May use culture data for urine sterilization prior to procedure  Orders: -     Urinalysis, Complete       [name], MD  [facility] [address], [address] [place], [place] [address] [phone]

## 2024-06-05 NOTE — H&P (View-Only) (Signed)
 06/11/2024 2:20 PM   Jenny Cardenas [birth year] [id number]  Reason for visit: Follow up nephrolithiasis   HPI: 73 y.o. female, follow up with me today CT Stone (05/21/24) - 2.2 cm Left renal pelvic stone     Prior HPI: Recent CT Chest for lung Ca surveillance Incidental 11mm Left renal pelvic stone, perinephric stranding, mild pelviectasis   Accompanied by her daughter today First lifetime stone Generally asymptomatic, incidental finding-patient mentions some lower back pain when changing positions Denies recurrent UTI Daughter inquired about UTI today-subacute, several week history of cognitive decline (in the setting of diagnosed dementia Patient denies overt UTI symptoms   UA today-trace LE, 30 RBC, 30 WBC,>10 squamous + Family history of nephrolithiasis    Physical Exam: BP 113/65   Pulse 70   Ht 5' 2 (1.575 m)   Wt 150 lb (68 kg)   SpO2 97%   BMI 27.44 kg/m    Constitutional:  Alert and oriented, No acute distress.  Laboratory Data: Urine Culture, Comprehensive Final report Abnormal   Organism ID, Bacteria Comment Abnormal   Comment: Escherichia coli, identified by an automated biochemical system. Susceptibility profile is consistent with a probable ESBL. Multi-Drug Resistant Organism Greater than 100,000 colony forming units per mL  Organism ID, Bacteria Comment  Comment: Mixed urogenital flora 25,000-50,000 colony forming units per mL  ANTIMICROBIAL SUSCEPTIBILITY Comment  Comment:       ** S = Susceptible; I = Intermediate; R = Resistant **                    P = Positive; N = Negative             MICS are expressed in micrograms per mL    Antibiotic                 RSLT#1    RSLT#2    RSLT#3    RSLT#4 Amoxicillin /Clavulanic Acid    S Ampicillin                     R Cefazolin                       R Cefepime                       S Cefoxitin                      S Cefpodoxime                    R Ceftriaxone                     R Ciprofloxacin                   R Ertapenem                      S Gentamicin                     R Levofloxacin                   R Meropenem                      S Nitrofurantoin                 S Piperacillin/Tazobactam  S Tetracycline                   R Tobramycin                     I Trimethoprim/Sulfa             R    Pertinent Imaging: I have personally viewed and interpreted the CT Stone (05/31/24) -2.2 cm left renal pelvic stone at longest diameter, no hydronephrosis, although perhaps some mild pelviectasis with left peripelvic stranding.  Contralateral right kidney morphologically normal in appearance, no nephrolithiasis.  Bilateral ureters and bladder normal.  IMPRESSION: 16 x 7 mm calculus in left renal pelvis, with mild pelvicaliectasis.   Diffuse wall thickening of the left renal pelvis surrounding this calculus, likely due to pyelitis/urinary tract infection, although urothelial carcinoma cannot be excluded.   Increased left renal parenchymal atrophy.   Punctate nonobstructing right renal calculus.   Colonic diverticulosis, without radiographic evidence of diverticulitis.    Assessment & Plan:    Nephrolithiasis Assessment & Plan: CT Stone (05/21/24) - 2.2 cm Left renal pelvic stone  Recent E. coli UTI Nonspecific lower back pain, Left > Right (hx of multiple fractures, back surgeries)  Reviewed her recent CT stone study which revealed a large 2.2 cm left renal pelvic stone.  Imaging also suggest inflammation of the left renal pelvis.  Considering total volume stone burden, back pain, recent UTI-would recommend definitive stone treatmentwith uroscopy and laser lithotripsy.  She would not be a great candidate for ESWL with this size stone.  I explained the ureteroscopy procedure in detail today, emphasizing the possibility that she may require staged procedures, with temporary ureteral stent placement.  All questions were answered, minimal to proceeding  - Schedule left  ureteroscopy, laser lithotripsy, left ureteral stent placement, next available -Reflex urine culture today-will not start antibiotics as she is asymptomatic.  May use culture data for urine sterilization prior to procedure  Orders: -     Urinalysis, Complete       Jenny JONELLE Skye, MD  [facility] [address], [address] [place], [place] [address] [phone]

## 2024-06-11 ENCOUNTER — Ambulatory Visit: Admitting: Urology

## 2024-06-11 ENCOUNTER — Encounter: Payer: Self-pay | Admitting: Urology

## 2024-06-11 VITALS — BP 113/65 | HR 70 | Ht 62.0 in | Wt 150.0 lb

## 2024-06-11 DIAGNOSIS — N200 Calculus of kidney: Secondary | ICD-10-CM | POA: Diagnosis not present

## 2024-06-11 DIAGNOSIS — N3000 Acute cystitis without hematuria: Secondary | ICD-10-CM

## 2024-06-11 LAB — URINALYSIS, COMPLETE
Bilirubin, UA: NEGATIVE
Glucose, UA: NEGATIVE
Ketones, UA: NEGATIVE
Nitrite, UA: POSITIVE — AB
Specific Gravity, UA: 1.03 (ref 1.005–1.030)
Urobilinogen, Ur: 0.2 mg/dL (ref 0.2–1.0)
pH, UA: 6 (ref 5.0–7.5)

## 2024-06-11 LAB — MICROSCOPIC EXAMINATION: WBC, UA: >30 /HPF — AB (ref 0–5)

## 2024-06-11 NOTE — Assessment & Plan Note (Signed)
 Recent urine culture resulted in 25-50K MUF Empiric Macrobid seem to help with cognition and mental status UA today -similar results with inflammatory infectious markers Asymptomatic from lower urinary tract standpoint  - Unclear relationship between MUF and cognitive side effects -I would be hesitant to overtreat with antibiotics with every mixed culture -Will send reflex urine culture again today.  We may ultimately end up treating bacteriuria for preop sterilization-a second chance to reassess effects on cognition

## 2024-06-12 ENCOUNTER — Other Ambulatory Visit: Payer: Self-pay

## 2024-06-12 DIAGNOSIS — N200 Calculus of kidney: Secondary | ICD-10-CM

## 2024-06-12 NOTE — Progress Notes (Signed)
 Surgical Physician Order Form [facility] [place]  Dr. Georganne, MD  * Scheduling expectation : Next Available  *Length of Case: 60 min  *Clearance needed: no  *Anticoagulation Instructions: N/A  *Aspirin  Instructions: N/A  *Post-op visit Date/Instructions:  1 month with RUS prior  *Diagnosis: Left Nephrolithiasis  *Procedure: left Ureteroscopy w/laser lithotripsy & stent placement (47643)   Additional orders: N/A  -Admit type: OUTpatient  -Anesthesia: General  -VTE Prophylaxis Standing Order SCD's       Other:   -Standing Lab Orders Per Anesthesia    Lab other: UA&Urine Culture  -Standing Test orders EKG/Chest x-ray per Anesthesia       Test other:   - Medications:  Ancef  2gm IV  -Other orders:  N/A

## 2024-06-14 LAB — CULTURE, URINE COMPREHENSIVE

## 2024-06-17 ENCOUNTER — Telehealth: Payer: Self-pay

## 2024-06-17 NOTE — Progress Notes (Signed)
   [facility] Surgical Posting Form  Surgery Date: Date: 06/30/2024  Surgeon: Dr. [name], MD  Inpt ( No  )   Outpt (Yes)   Obs ( No  )   Diagnosis: N20.0 Left Nephrolithiasis  -CPT: [phone]  Surgery: Left Ureteroscopy with Laser Lithotripsy and Stent Placement   Stop Anticoagulations: No  Cardiac/Medical/Pulmonary Clearance needed: no  *Orders entered into EPIC  Date: 06/17/24   *Case booked in [place]  Date: 06/17/24  *Notified pt of Surgery: Date: 06/17/24  PRE-OP UA & CX: yes, obtained in clinic on 06/11/2024  *Placed into Prior Authorization Work [place] Date: 06/17/24  Assistant/laser/rep:No

## 2024-06-17 NOTE — Telephone Encounter (Signed)
 Per Dr. Georganne, Patient is to be scheduled for Left Ureteroscopy with Laser Lithotripsy and Stent Placement   Mrs. Demedeiros was contacted and possible surgical dates were discussed, Tuesday November 18th, 2025 was agreed upon for surgery.   Patient was directed to call [phone] between 1-3pm the day before surgery to find out surgical arrival time.  Instructions were given not to eat or drink from midnight on the night before surgery and have a driver for the day of surgery. On the surgery day patient was instructed to enter through the Medical Mall entrance of [facility] report the Same Day Surgery desk.   Pre-Admit Testing will be in contact via phone to set up an interview with the anesthesia team to review your history and medications prior to surgery.   Reminder of this information was sent via MyChart to the patient.

## 2024-06-19 ENCOUNTER — Encounter
Admission: RE | Admit: 2024-06-19 | Discharge: 2024-06-19 | Disposition: A | Discharge status: Home / Self Care | Source: Ambulatory Visit | Attending: Urology | Admitting: Urology

## 2024-06-19 ENCOUNTER — Other Ambulatory Visit: Payer: Self-pay

## 2024-06-19 DIAGNOSIS — E119 Type 2 diabetes mellitus without complications: Secondary | ICD-10-CM

## 2024-06-19 DIAGNOSIS — N200 Calculus of kidney: Secondary | ICD-10-CM

## 2024-06-19 HISTORY — DX: Personal history of urinary calculi: Z87.442

## 2024-06-19 HISTORY — DX: Pneumonia, unspecified organism: J18.9

## 2024-06-19 NOTE — Patient Instructions (Addendum)
 Your procedure is scheduled on: 11/18 /25 - Tuesday Report to the Registration Desk on the 1st floor of the Medical Mall. To find out your arrival time, please call [phone] between 1PM - 3PM on: 06/29/24 - Monday If your arrival time is 6:00 am, do not arrive before that time as the Medical Mall entrance doors do not open until 6:00 am.  REMEMBER: Instructions that are not followed completely may result in serious medical risk, up to and including death; or upon the discretion of your surgeon and anesthesiologist your surgery may need to be rescheduled.  Do not eat food or drink any liquids after midnight the night before surgery.  No gum chewing or hard candies.  One week prior to surgery: Stop Anti-inflammatories (NSAIDS) such as Advil , Aleve, Ibuprofen , Motrin , Naproxen, Naprosyn and Aspirin  based products such as Excedrin, Goody's Powder, BC Powder. You may take Tylenol  if needed for pain up until the day of surgery.  Stop ANY OVER THE COUNTER supplements until after surgery.  ON THE DAY OF SURGERY ONLY TAKE THESE MEDICATIONS WITH SIPS OF WATER:  albuterol  (PROVENTIL ) if needed levothyroxine  (SYNTHROID ) metoprolol  succinate   pantoprazole  (PROTONIX )  rivastigmine  (EXELON )    Use albuterol  (VENTOLIN  HFA)  on the day of surgery and bring to the hospital.   No Alcohol  for 24 hours before or after surgery.  No Smoking including e-cigarettes for 24 hours before surgery.  No chewable tobacco products for at least 6 hours before surgery.  No nicotine patches on the day of surgery.  Do not use any recreational drugs for at least a week (preferably 2 weeks) before your surgery.  Please be advised that the combination of cocaine and anesthesia may have negative outcomes, up to and including death. If you test positive for cocaine, your surgery will be cancelled.  On the morning of surgery brush your teeth with toothpaste and water, you may rinse your mouth with mouthwash if  you wish. Do not swallow any toothpaste or mouthwash.  Do not wear jewelry, make-up, hairpins, clips or nail polish.  For welded (permanent) jewelry: bracelets, anklets, waist bands, etc.  Please have this removed prior to surgery.  If it is not removed, there is a chance that hospital personnel will need to cut it off on the day of surgery.  Do not wear lotions, powders, or perfumes.   Do not shave body hair from the neck down 48 hours before surgery.  Contact lenses, hearing aids and dentures may not be worn into surgery.  Do not bring valuables to the hospital. [facility] is not responsible for any missing/lost belongings or valuables.   Notify your doctor if there is any change in your medical condition (cold, fever, infection).  Wear comfortable clothing (specific to your surgery type) to the hospital.  After surgery, you can help prevent lung complications by doing breathing exercises.  Take deep breaths and cough every 1-2 hours. Your doctor may order a device called an Incentive Spirometer to help you take deep breaths.  If you are being admitted to the hospital overnight, leave your suitcase in the car. After surgery it may be brought to your room.  In case of increased patient census, it may be necessary for you, the patient, to continue your postoperative care in the Same Day Surgery department.  If you are being discharged the day of surgery, you will not be allowed to drive home. You will need a responsible individual to drive you home and stay  with you for 24 hours after surgery.   If you are taking public transportation, you will need to have a responsible individual with you.  Please call the Pre-admissions Testing Dept. at [phone] if you have any questions about these instructions.  Surgery Visitation Policy:  Patients having surgery or a procedure may have two visitors.  Children under the age of 55 must have an adult with them who is not the  patient.  Inpatient Visitation:    Visiting hours are 7 a.m. to 8 p.m. Up to four visitors are allowed at one time in a patient room. The visitors may rotate out with other people during the day.  One visitor age 64 or older may stay with the patient overnight and must be in the room by 8 p.m.   Merchandiser, Retail to address health-related social needs:  [email or web address]

## 2024-06-22 ENCOUNTER — Telehealth: Payer: Self-pay

## 2024-06-22 NOTE — Telephone Encounter (Signed)
 Copied from CRM [id number]. Topic: Clinical - Medical Advice >> Jun 22, 2024  1:33 PM Delon HERO wrote: Reason for CRM: Patient is calling to let Dr. Tanda know she is scheduled for kidney surgery 06/20/2024.

## 2024-06-29 ENCOUNTER — Telehealth: Payer: Self-pay

## 2024-06-29 NOTE — Telephone Encounter (Signed)
 Spoke with pt. Daughter. Patient urine culture needs to be treated before surgery tomorrow. Patient will start Macrobid that she has on hand. She will take 7 days of medication. We have moved her surgery from 11/18 to 07/07/2024. Patient and daughter verbalized understanding. Apologized for not seeing this sooner. Patient encouraged to call with any questions or concerns.

## 2024-07-06 ENCOUNTER — Other Ambulatory Visit: Payer: Self-pay | Admitting: Family Medicine

## 2024-07-06 DIAGNOSIS — K219 Gastro-esophageal reflux disease without esophagitis: Secondary | ICD-10-CM

## 2024-07-07 ENCOUNTER — Ambulatory Visit

## 2024-07-07 ENCOUNTER — Ambulatory Visit
Admission: RE | Admit: 2024-07-07 | Discharge: 2024-07-07 | Disposition: A | Discharge status: Home / Self Care | Attending: Urology | Admitting: Urology

## 2024-07-07 ENCOUNTER — Ambulatory Visit: Admitting: Anesthesiology

## 2024-07-07 ENCOUNTER — Encounter
Admission: RE | Disposition: A | Discharge status: Home / Self Care | Payer: Self-pay | Source: Home / Self Care | Attending: Urology

## 2024-07-07 ENCOUNTER — Other Ambulatory Visit: Payer: Self-pay

## 2024-07-07 ENCOUNTER — Encounter: Payer: Self-pay | Admitting: Urology

## 2024-07-07 DIAGNOSIS — Z9889 Other specified postprocedural states: Secondary | ICD-10-CM | POA: Diagnosis not present

## 2024-07-07 DIAGNOSIS — F0390 Unspecified dementia without behavioral disturbance: Secondary | ICD-10-CM | POA: Insufficient documentation

## 2024-07-07 DIAGNOSIS — N200 Calculus of kidney: Secondary | ICD-10-CM | POA: Insufficient documentation

## 2024-07-07 DIAGNOSIS — Z85118 Personal history of other malignant neoplasm of bronchus and lung: Secondary | ICD-10-CM | POA: Insufficient documentation

## 2024-07-07 DIAGNOSIS — I2510 Atherosclerotic heart disease of native coronary artery without angina pectoris: Secondary | ICD-10-CM | POA: Diagnosis not present

## 2024-07-07 DIAGNOSIS — I10 Essential (primary) hypertension: Secondary | ICD-10-CM | POA: Diagnosis not present

## 2024-07-07 DIAGNOSIS — F418 Other specified anxiety disorders: Secondary | ICD-10-CM | POA: Diagnosis not present

## 2024-07-07 DIAGNOSIS — E119 Type 2 diabetes mellitus without complications: Secondary | ICD-10-CM

## 2024-07-07 HISTORY — PX: CYSTOSCOPY/URETEROSCOPY/HOLMIUM LASER/STENT PLACEMENT: SHX6546

## 2024-07-07 LAB — GLUCOSE, CAPILLARY: Glucose-Capillary: 121 mg/dL — ABNORMAL HIGH (ref 70–99)

## 2024-07-07 SURGERY — CYSTOSCOPY/URETEROSCOPY/HOLMIUM LASER/STENT PLACEMENT
Anesthesia: General | Site: Ureter | Laterality: Left

## 2024-07-07 MED ORDER — TRAMADOL HCL 50 MG PO TABS
ORAL_TABLET | ORAL | Status: AC
  Filled 2024-07-07: qty 1

## 2024-07-07 MED ORDER — PHENAZOPYRIDINE HCL 200 MG PO TABS: 200.0000 mg | ORAL_TABLET | Freq: Three times a day (TID) | ORAL | 0 refills | Status: AC | PRN

## 2024-07-07 MED ORDER — LIDOCAINE HCL (CARDIAC) PF 100 MG/5ML IV SOSY
PREFILLED_SYRINGE | INTRAVENOUS | Status: DC | PRN
  Administered 2024-07-07: 100 mg via INTRAVENOUS

## 2024-07-07 MED ORDER — CEFAZOLIN SODIUM-DEXTROSE 2-4 GM/100ML-% IV SOLN
INTRAVENOUS | Status: AC
  Filled 2024-07-07: qty 100

## 2024-07-07 MED ORDER — FENTANYL CITRATE (PF) 100 MCG/2ML IJ SOLN
INTRAMUSCULAR | Status: DC | PRN
  Administered 2024-07-07 (×2): 50 ug via INTRAVENOUS

## 2024-07-07 MED ORDER — ROCURONIUM BROMIDE 100 MG/10ML IV SOLN
INTRAVENOUS | Status: DC | PRN
  Administered 2024-07-07 (×2): 50 mg via INTRAVENOUS

## 2024-07-07 MED ORDER — OXYBUTYNIN CHLORIDE ER 5 MG PO TB24: 5.0000 mg | ORAL_TABLET | Freq: Every day | ORAL | 0 refills | Status: AC

## 2024-07-07 MED ORDER — FENTANYL CITRATE (PF) 100 MCG/2ML IJ SOLN
INTRAMUSCULAR | Status: AC
  Filled 2024-07-07: qty 2

## 2024-07-07 MED ORDER — ONDANSETRON HCL 4 MG PO TABS: 4.0000 mg | ORAL_TABLET | Freq: Three times a day (TID) | ORAL | 0 refills | Status: AC | PRN

## 2024-07-07 MED ORDER — IOHEXOL 180 MG/ML  SOLN
INTRAMUSCULAR | Status: DC | PRN
  Administered 2024-07-07: 20 mL

## 2024-07-07 MED ORDER — STERILE WATER FOR IRRIGATION IR SOLN
Status: DC | PRN
  Administered 2024-07-07: 500 mL

## 2024-07-07 MED ORDER — CHLORHEXIDINE GLUCONATE 0.12 % MT SOLN
OROMUCOSAL | Status: AC
  Filled 2024-07-07: qty 15

## 2024-07-07 MED ORDER — FENTANYL CITRATE (PF) 100 MCG/2ML IJ SOLN: 25.0000 ug | INTRAMUSCULAR | Status: DC | PRN

## 2024-07-07 MED ORDER — PROPOFOL 10 MG/ML IV BOLUS
INTRAVENOUS | Status: DC | PRN
Start: 2024-07-07 — End: 2024-07-07
  Administered 2024-07-07: 100 ug/kg/min via INTRAVENOUS
  Administered 2024-07-07: 125 ug/kg/min via INTRAVENOUS
  Administered 2024-07-07: 100 mg via INTRAVENOUS

## 2024-07-07 MED ORDER — PROPOFOL 10 MG/ML IV BOLUS
INTRAVENOUS | Status: AC
  Filled 2024-07-07: qty 20

## 2024-07-07 MED ORDER — SODIUM CHLORIDE 0.9 % IR SOLN
Status: DC | PRN
  Administered 2024-07-07 (×2): 3000 mL

## 2024-07-07 MED ORDER — TRAMADOL HCL 50 MG PO TABS
50.0000 mg | ORAL_TABLET | Freq: Four times a day (QID) | ORAL | 0 refills | Status: AC | PRN
Start: 2024-07-07 — End: 2024-07-10

## 2024-07-07 MED ORDER — SODIUM CHLORIDE 0.9 % IV SOLN: INTRAVENOUS | Status: DC

## 2024-07-07 MED ORDER — ONDANSETRON HCL 4 MG/2ML IJ SOLN
INTRAMUSCULAR | Status: AC
  Filled 2024-07-07: qty 2

## 2024-07-07 MED ORDER — SUGAMMADEX SODIUM 200 MG/2ML IV SOLN
INTRAVENOUS | Status: DC | PRN
  Administered 2024-07-07: 200 mg via INTRAVENOUS

## 2024-07-07 MED ORDER — TRAMADOL HCL 50 MG PO TABS
50.0000 mg | ORAL_TABLET | Freq: Once | ORAL | Status: AC
  Administered 2024-07-07: 50 mg via ORAL

## 2024-07-07 MED ORDER — SODIUM CHLORIDE 0.9 % IV SOLN: 12.5000 mg | INTRAVENOUS | Status: DC | PRN

## 2024-07-07 MED ORDER — ONDANSETRON HCL 4 MG/2ML IJ SOLN
INTRAMUSCULAR | Status: DC | PRN
  Administered 2024-07-07: 4 mg via INTRAVENOUS

## 2024-07-07 MED ORDER — ORAL CARE MOUTH RINSE: 15.0000 mL | Freq: Once | OROMUCOSAL | Status: AC

## 2024-07-07 MED ORDER — TAMSULOSIN HCL 0.4 MG PO CAPS: 0.4000 mg | ORAL_CAPSULE | Freq: Every day | ORAL | 0 refills | Status: AC

## 2024-07-07 MED ORDER — OXYCODONE HCL 5 MG PO TABS
5.0000 mg | ORAL_TABLET | Freq: Once | ORAL | Status: AC
  Administered 2024-07-07: 5 mg via ORAL

## 2024-07-07 MED ORDER — ACETAMINOPHEN 10 MG/ML IV SOLN
INTRAVENOUS | Status: DC | PRN
  Administered 2024-07-07: 1000 mg via INTRAVENOUS

## 2024-07-07 MED ORDER — OXYCODONE HCL 5 MG PO TABS
ORAL_TABLET | ORAL | Status: AC
  Filled 2024-07-07: qty 1

## 2024-07-07 MED ORDER — ONDANSETRON HCL 4 MG/2ML IJ SOLN
4.0000 mg | Freq: Once | INTRAMUSCULAR | Status: AC
  Administered 2024-07-07: 4 mg via INTRAVENOUS

## 2024-07-07 MED ORDER — MIDAZOLAM HCL (PF) 2 MG/2ML IJ SOLN
INTRAMUSCULAR | Status: DC | PRN
  Administered 2024-07-07: 2 mg via INTRAVENOUS

## 2024-07-07 MED ORDER — CHLORHEXIDINE GLUCONATE 0.12 % MT SOLN
15.0000 mL | Freq: Once | OROMUCOSAL | Status: AC
  Administered 2024-07-07: 15 mL via OROMUCOSAL

## 2024-07-07 MED ORDER — MIDAZOLAM HCL 2 MG/2ML IJ SOLN
INTRAMUSCULAR | Status: AC
  Filled 2024-07-07: qty 2

## 2024-07-07 MED ORDER — ACETAMINOPHEN 10 MG/ML IV SOLN
INTRAVENOUS | Status: AC
  Filled 2024-07-07: qty 100

## 2024-07-07 MED ORDER — CEFAZOLIN SODIUM-DEXTROSE 2-4 GM/100ML-% IV SOLN
2.0000 g | INTRAVENOUS | Status: AC
  Administered 2024-07-07: 2 g via INTRAVENOUS

## 2024-07-07 MED ORDER — DEXAMETHASONE SOD PHOSPHATE PF 10 MG/ML IJ SOLN
INTRAMUSCULAR | Status: DC | PRN
  Administered 2024-07-07: 10 mg via INTRAVENOUS

## 2024-07-07 SURGICAL SUPPLY — 26 items
ADHESIVE MASTISOL STRL (MISCELLANEOUS) IMPLANT
BAG DRAIN SIEMENS DORNER NS (MISCELLANEOUS) ×2 IMPLANT
BAG PRESSURE INF REUSE 3000 (BAG) ×2 IMPLANT
BASKET ZERO TIP 1.9FR (BASKET) IMPLANT
BLADE SURG SZ11 CARB STEEL (BLADE) IMPLANT
BRUSH SCRUB EZ 4% CHG (MISCELLANEOUS) ×2 IMPLANT
CATH URET FLEX-TIP 2 LUMEN 10F (CATHETERS) IMPLANT
CATH URETL OPEN 5X70 (CATHETERS) IMPLANT
DRAPE UTILITY 15X26 TOWEL STRL (DRAPES) ×2 IMPLANT
DRSG TEGADERM 2-3/8X2-3/4 SM (GAUZE/BANDAGES/DRESSINGS) IMPLANT
FIBER LASER MOSES 200 DFL (Laser) IMPLANT
FIBER LASER MOSES 365 DFL (Laser) IMPLANT
GLOVE BIOGEL PI IND STRL 7.0 (GLOVE) ×2 IMPLANT
GOWN STRL REUS W/ TWL LRG LVL3 (GOWN DISPOSABLE) ×4 IMPLANT
GUIDEWIRE STR DUAL SENSOR (WIRE) ×2 IMPLANT
KIT TURNOVER CYSTO (KITS) ×2 IMPLANT
PACK CYSTO AR (MISCELLANEOUS) ×2 IMPLANT
SET CYSTO IRRIGATION (SET/KITS/TRAYS/PACK) ×2 IMPLANT
SHEATH NAVIGATOR HD 12/14X36 (SHEATH) IMPLANT
SOL .9 NS 3000ML IRR UROMATIC (IV SOLUTION) ×2 IMPLANT
SOLN STERILE WATER 500 ML (IV SOLUTION) ×2 IMPLANT
STENT URET 6FRX24 CONTOUR (STENTS) IMPLANT
STENT URET 6FRX26 CONTOUR (STENTS) IMPLANT
SURGILUBE 2OZ TUBE FLIPTOP (MISCELLANEOUS) ×2 IMPLANT
SYR 10ML LL (SYRINGE) ×2 IMPLANT
VALVE UROSEAL ADJ ENDO (VALVE) IMPLANT

## 2024-07-07 NOTE — Interval H&P Note (Signed)
 History and Physical Interval Note:  07/07/2024 12:36 PM  Jenny Cardenas  has presented today for surgery, with the diagnosis of Left Nephrolithiasis.  The various methods of treatment have been discussed with the patient and family. After consideration of risks, benefits and other options for treatment, the patient has consented to  Procedure(s): CYSTOSCOPY/URETEROSCOPY/HOLMIUM LASER/STENT PLACEMENT (Left) as a surgical intervention.  The patient's history has been reviewed, patient examined, no change in status, stable for surgery.  I have reviewed the patient's chart and labs.  Questions were answered to the patient's satisfaction.    General: no acute distress, alert/oriented, conversational  HEENT: equal nondilated pupils CV: regular rate Lung: unlabored breathing, regular rate and rhythm  Abd: nondistended, nontender with palpation, no palpable masses  MSK: moving all extremities without issue, normal observed motor function  Laterality: Left Procedure: Left URS/LL, stent placement  Possible staged procedure Site marked  Informed consent obtained, we specifically discussed the risks of bleeding, infection, post-operative pain, need for additional procedures. Operative site appropriately marked where indicated  Jenny JONELLE Skye, MD 07/07/2024    Jenny Cardenas

## 2024-07-07 NOTE — Anesthesia Preprocedure Evaluation (Signed)
 Anesthesia Evaluation  Patient identified by MRN, date of birth, ID band Patient awake    Reviewed: Allergy & Precautions, H&P , NPO status , Patient's Chart, lab work & pertinent test results, reviewed documented beta blocker date and time   History of Anesthesia Complications Negative for: history of anesthetic complications  Airway Mallampati: II  TM Distance: >3 FB Neck ROM: full    Dental  (+) Dental Advidsory Given, Teeth Intact   Pulmonary neg pulmonary ROS, neg shortness of breath, neg sleep apnea, neg COPD, neg recent URI, former smoker Right upper lobectomy for lung cancer   Pulmonary exam normal breath sounds clear to auscultation       Cardiovascular Exercise Tolerance: Good hypertension (history), (-) angina + CAD  (-) Past MI and (-) Cardiac Stents + dysrhythmias Supra Ventricular Tachycardia (-) Valvular Problems/Murmurs Rhythm:regular Rate:Normal     Neuro/Psych  PSYCHIATRIC DISORDERS Anxiety Depression   Dementia negative neurological ROS     GI/Hepatic Neg liver ROS,GERD  ,,  Endo/Other  diabetes (borderline), Well ControlledHypothyroidism    Renal/GU Renal disease (kidney stones)  negative genitourinary   Musculoskeletal   Abdominal   Peds  Hematology negative hematology ROS (+)   Anesthesia Other Findings Past Medical History: 2020: ADHD (attention deficit hyperactivity disorder)     Comment:  ADD No date: Anxiety No date: Arthritis 2013: Broken heart syndrome No date: Carpal tunnel syndrome No date: Cataract No date: Chronic kidney disease No date: Dementia (HCC) No date: Depression No date: Diabetes mellitus without complication (HCC)     Comment:  Type 2 1976: Dysrhythmia     Comment:  tachycardia 04/10/2015: Fall No date: GERD (gastroesophageal reflux disease) No date: Goiter No date: History of kidney stones No date: History of shingles     Comment:  On abdomen, left ring  finger, and left leg; Pt takes               Valtrex  No date: Hyperlipidemia No date: Hypertension No date: Hypothyroidism 04/2021: lung ca No date: Obesity No date: Pneumonia No date: PTSD (post-traumatic stress disorder) 1979: Scratched cornea     Comment:  from EDTA No date: Shortness of breath dyspnea     Comment:  pt stated its related to the back problems she has No date: Thyroid  disease No date: Ulcer   Reproductive/Obstetrics negative OB ROS                              Anesthesia Physical Anesthesia Plan  ASA: 3  Anesthesia Plan: General   Post-op Pain Management:    Induction: Intravenous  PONV Risk Score and Plan: 3 and Ondansetron , Dexamethasone , Midazolam  and Treatment may vary due to age or medical condition  Airway Management Planned: LMA and Oral ETT  Additional Equipment:   Intra-op Plan:   Post-operative Plan: Extubation in OR  Informed Consent: I have reviewed the patients History and Physical, chart, labs and discussed the procedure including the risks, benefits and alternatives for the proposed anesthesia with the patient or authorized representative who has indicated his/her understanding and acceptance.     Dental Advisory Given  Plan Discussed with: Anesthesiologist, CRNA and Surgeon  Anesthesia Plan Comments:          Anesthesia Quick Evaluation

## 2024-07-07 NOTE — Op Note (Signed)
 Preoperative diagnosis:  Left renal stone   Postoperative diagnosis:  Left renal stone   Procedure:  Cystoscopy Left ureteroscopy with laser lithotripsy Left retrograde pyelography with interpretation  Left ureteral stent placement  Surgeon: [name], MD  Anesthesia: General  Complications: None  Intraoperative findings: Large left renal pelvic stone. Treated with laser lithotripsy (~80-90% total treatment). Limited by visualization towards end of case, has small to medium upper and interpolar stones.  Left ureteral stent placed without string  EBL: Minimal  Specimens:  Left renal stones  Indication: Jenny Cardenas is a 73 y.o. patient with:  CT Stone (05/21/24) - 2.2 cm Left renal pelvic stone  Recent E. coli UTI - on Macrobid  Nonspecific lower back pain, Left > Right (hx of multiple fractures, back surgeries)  After reviewing the management options for treatment, he elected to proceed with the above surgical procedure(s). We have discussed the potential benefits and risks of the procedure, side effects of the proposed treatment, the likelihood of the patient achieving the goals of the procedure, and any potential problems that might occur during the procedure or recuperation. Informed consent has been obtained.  Description of procedure:  The patient was taken to the operating room and general anesthesia was induced.  The patient was placed in the dorsal lithotomy position, prepped and draped in the usual sterile fashion, and preoperative antibiotics were administered. A preoperative time-out was performed.   Cystourethroscopy was performed.  The patient's urethra was examined and was normal. The bladder was then systematically examined in its entirety. There was no evidence for any bladder tumors, stones, or other mucosal pathology.  Bilateral ureteral orifices noted in orthotopic location.  Attention then turned to the Left ureteral orifice and a ureteral catheter  was used to intubate the ureteral orifice.  Omnipaque  contrast was injected through the ureteral catheter and a retrograde pyelogram was performed with findings: Large left renal radioopacity, otherwise normal left ureteral contours, no hydroureteronephrosis.  A sensor guidewire was then advanced up the Left ureter into the renal pelvis under fluoroscopic guidance. Next, we elected to place a ureteral access sheath.  This was placed under direct fluoroscopic guidance. We transitioned to a flexible ureteroscope and a complete pyeloscopy was performed.  Large Stone burden was encountered at the renal pelvis consistent with preoperative imaging.  A 365 m holmium laser was used to fully treat the stone with laser lithotripsy.   Next, a zero-tip wire basket was introduced and all sizeable stone fragments were extracted. and A pull-back retrograde pyelogram and direct ureteroscopy was performed. This did not reveal any residual stone fragments, filling defects or contrast extravasation.  Ultimately towards the end of the case we were limited by visualization and stone debris.  Spot KUB suggested nearly 80-90% total stone treatment.  There was a small radiopacity at the interpolar location.  Direct visualization suggested small to medium fragments with an upper pole and interpolar calyx.  We elected to place a temporary indwelling stent without a string. Our sensor guidewire was replaced and a 54F x 24 cm JJ stent , which was placed via fluoroscopic guidance. A spot KUB confirmed appropriate proximal and distal curl locations.   The bladder was then emptied and the procedure ended.  The patient appeared to tolerate the procedure well and without complications.  The patient was able to be awakened and transferred to the recovery unit in satisfactory condition.   Plan: -Will plan for second look left ureteroscopy laser lithotripsy in the next 3-4 weeks -  Submit urinalysis 7 days prior  [name], M.D.

## 2024-07-07 NOTE — Discharge Instructions (Addendum)
 DISCHARGE INSTRUCTIONS FOR KIDNEY STONE/URETERAL STENT   MEDICATIONS:  1.  Resume all your other meds from home - except do not take any extra narcotic pain meds that you may have at home.  2. Pyridium  is to help with the burning/stinging when you urinate. 3. Tramadol  is for moderate/severe pain, otherwise taking upto 1000 mg every 6 hours of plainTylenol will help treat your pain.    ACTIVITY:  1. No strenuous activity x 1week  2. No driving while on narcotic pain medications  3. Drink plenty of water   4. Continue to walk at home - you can still get blood clots when you are at home, so keep active, but don't over do it.  5. May return to work/school tomorrow or when you feel ready   BATHING:  1. You can shower and we recommend daily showers  2. You have a string coming from your urethra: The stent string is attached to your ureteral stent. Do not pull on this.   SIGNS/SYMPTOMS TO CALL:  Please call us  if you have a fever greater than 101.5, uncontrolled nausea/vomiting, uncontrolled pain, dizziness, unable to urinate, bloody urine, chest pain, shortness of breath, leg swelling, leg pain, redness around wound, drainage from wound, or any other concerns or questions.   You can reach us  at [phone].   FOLLOW-UP:  We were able to successfully treat 80-90% of your total left renal stone burden today. 1.  We will plan for a second look ureteroscopy laser lithotripsy in 3-4 weeks 2.  You currently have a left ureteral stent in place which will remain until your next surgery

## 2024-07-07 NOTE — Anesthesia Procedure Notes (Signed)
 Procedure Name: Intubation Date/Time: 07/07/2024 1:14 PM  Performed by: Bonnetta Jimmey SAUNDERS, CRNAPre-anesthesia Checklist: Patient identified, Emergency Drugs available, Suction available and Patient being monitored Patient Re-evaluated:Patient Re-evaluated prior to induction Oxygen Delivery Method: Circle system utilized Preoxygenation: Pre-oxygenation with 100% oxygen Induction Type: IV induction Ventilation: Mask ventilation without difficulty Laryngoscope Size: McGrath and 3 Grade View: Grade I Tube type: Oral Tube size: 6.5 mm Number of attempts: 1 Airway Equipment and Method: Stylet and Oral airway Placement Confirmation: ETT inserted through vocal cords under direct vision, positive ETCO2 and breath sounds checked- equal and bilateral Tube secured with: Tape Dental Injury: Teeth and Oropharynx as per pre-operative assessment

## 2024-07-07 NOTE — Transfer of Care (Signed)
 Immediate Anesthesia Transfer of Care Note  Patient: Jenny Cardenas  Procedure(s) Performed: CYSTOSCOPY/URETEROSCOPY/HOLMIUM LASER/STENT PLACEMENT (Left: Ureter)  Patient Location: PACU  Anesthesia Type:General  Level of Consciousness: awake  Airway & Oxygen Therapy: Patient Spontanous Breathing and Patient connected to face mask oxygen  Post-op Assessment: Report given to RN and Post -op Vital signs reviewed and stable  Post vital signs: Reviewed and stable  Last Vitals:  Vitals Value Taken Time  BP 115/57 07/07/24 14:45  Temp    Pulse 72 07/07/24 14:47  Resp 18 07/07/24 14:47  SpO2 100 % 07/07/24 14:47  Vitals shown include unfiled device data.  Last Pain:  Vitals:   07/07/24 1155  TempSrc: Temporal  PainSc: 4       Patients Stated Pain Goal: 0 (07/07/24 1155)  Complications: There were no known notable events for this encounter.

## 2024-07-08 ENCOUNTER — Encounter: Payer: Self-pay | Admitting: Urology

## 2024-07-08 ENCOUNTER — Other Ambulatory Visit: Payer: Self-pay | Admitting: Family Medicine

## 2024-07-08 DIAGNOSIS — F32A Depression, unspecified: Secondary | ICD-10-CM

## 2024-07-13 NOTE — Telephone Encounter (Signed)
 Called pt daughter after she left Mychart message about her mom Jenny Cardenas. I spoke with Silvano pt daughter she said symptoms started after her surgery last Tuesday she did have nausea but took medication and it helped. She doesn't feel like she is completely emptying, experiencing pain on her right side when she lies down, Uses the restroom a couple times an hour. Pt is experiencing  urinary urge, burning and itching as well. Pt stated she had no discharge.Told pt I would give her a call back once I discuss with Dr.Garren.-Tony Granquist,CMA

## 2024-07-16 ENCOUNTER — Ambulatory Visit: Payer: Self-pay | Admitting: Family Medicine

## 2024-07-16 ENCOUNTER — Ambulatory Visit: Admitting: Family Medicine

## 2024-07-16 ENCOUNTER — Encounter: Payer: Self-pay | Admitting: Family Medicine

## 2024-07-16 VITALS — BP 115/61 | HR 75 | Ht 62.0 in | Wt 140.6 lb

## 2024-07-16 DIAGNOSIS — B3731 Acute candidiasis of vulva and vagina: Secondary | ICD-10-CM

## 2024-07-16 DIAGNOSIS — R399 Unspecified symptoms and signs involving the genitourinary system: Secondary | ICD-10-CM

## 2024-07-16 DIAGNOSIS — E119 Type 2 diabetes mellitus without complications: Secondary | ICD-10-CM

## 2024-07-16 DIAGNOSIS — E039 Hypothyroidism, unspecified: Secondary | ICD-10-CM | POA: Diagnosis not present

## 2024-07-16 DIAGNOSIS — F03A0 Unspecified dementia, mild, without behavioral disturbance, psychotic disturbance, mood disturbance, and anxiety: Secondary | ICD-10-CM

## 2024-07-16 DIAGNOSIS — F32A Depression, unspecified: Secondary | ICD-10-CM

## 2024-07-16 LAB — POCT URINALYSIS DIP (CLINITEK)
Bilirubin, UA: NEGATIVE
Glucose, UA: NEGATIVE mg/dL
Nitrite, UA: POSITIVE — AB
POC PROTEIN,UA: >=300 — AB
Spec Grav, UA: >=1.03 — AB (ref 1.010–1.025)
Urobilinogen, UA: 0.2 U/dL
pH, UA: 5.5 (ref 5.0–8.0)

## 2024-07-16 LAB — POCT GLYCOSYLATED HEMOGLOBIN (HGB A1C): HbA1c, POC (controlled diabetic range): 5.6 % (ref 0.0–7.0)

## 2024-07-16 MED ORDER — FLUCONAZOLE 150 MG PO TABS: 150.0000 mg | ORAL_TABLET | Freq: Once | ORAL | 0 refills | Status: AC

## 2024-07-16 MED ORDER — NITROFURANTOIN MONOHYD MACRO 100 MG PO CAPS: 100.0000 mg | ORAL_CAPSULE | Freq: Two times a day (BID) | ORAL | 0 refills | Status: AC

## 2024-07-16 NOTE — Telephone Encounter (Signed)
 Patient's daughter Eliazar) called and stated that patient has appointment with Dr. Tanda today, and wanted to know if patient could give urine sample there, so they didn't have to go to both offices. I messaged Nya, and she said that would be up to PCP, and if patient gave sample there, then PCP would handle treatment since they are doing testing. I advised patient's daughter of this, and she stated she just wanted to make sure that was ok with Dr. Georganne since patient had surgery recently.

## 2024-07-16 NOTE — Anesthesia Postprocedure Evaluation (Signed)
 Anesthesia Post Note  Patient: ESTELLAR CADENA  Procedure(s) Performed: CYSTOSCOPY/URETEROSCOPY/HOLMIUM LASER/STENT PLACEMENT (Left: Ureter)  Patient location during evaluation: PACU Anesthesia Type: General Level of consciousness: awake and alert Pain management: pain level controlled Vital Signs Assessment: post-procedure vital signs reviewed and stable Respiratory status: spontaneous breathing, nonlabored ventilation, respiratory function stable and patient connected to nasal cannula oxygen Cardiovascular status: blood pressure returned to baseline and stable Postop Assessment: no apparent nausea or vomiting Anesthetic complications: no   There were no known notable events for this encounter.   Last Vitals:  Vitals:   07/07/24 1556 07/07/24 1645  BP: (!) 129/56 (!) 117/47  Pulse: 60 75  Resp: 20 18  Temp:    SpO2: 97% 96%    Last Pain:  Vitals:   07/07/24 1642  TempSrc:   PainSc: 5                  Prentice Murphy

## 2024-07-16 NOTE — Progress Notes (Signed)
 Established Patient Office Visit  Subjective    Patient ID: Jenny Cardenas, female    DOB: 08-Aug-1951  Age: 73 y.o. MRN: [id number]  CC:  Chief Complaint  Patient presents with   Medical Management of Chronic Issues    Pt reports UTI symptoms      HPI Jenny Cardenas presents for routine follow up of chronic med issues including diabetes and hypertension   Outpatient Encounter Medications as of 07/16/2024  Medication Sig   albuterol  (PROVENTIL ) (2.5 MG/3ML) 0.083% nebulizer solution Take 3 mLs (2.5 mg total) by nebulization every 4 (four) hours as needed for wheezing or shortness of breath.   albuterol  (VENTOLIN  HFA) 108 (90 Base) MCG/ACT inhaler Inhale 1-2 puffs into the lungs every 6 (six) hours as needed for wheezing or shortness of breath. This is a one-time refill from me until the patient establish care with her primary care physician.  Please do not send request for refill to me in the future.   buPROPion  (WELLBUTRIN  XL) 300 MG 24 hr tablet TAKE 1 TABLET EVERY DAY   cyanocobalamin  (VITAMIN B12) 1000 MCG tablet Take 1 tablet (1,000 mcg total) by mouth daily.   [EXPIRED] fluconazole  (DIFLUCAN ) 150 MG tablet Take 1 tablet (150 mg total) by mouth once for 1 dose.   levocetirizine (XYZAL ) 5 MG tablet Take 5 mg by mouth daily as needed for allergies.   levothyroxine  (SYNTHROID ) 25 MCG tablet Take 1 tablet (25 mcg total) by mouth daily before breakfast.   metoprolol  succinate (TOPROL -XL) 25 MG 24 hr tablet Take 1 tablet (25 mg total) by mouth daily.   nitrofurantoin , macrocrystal-monohydrate, (MACROBID ) 100 MG capsule Take 1 capsule (100 mg total) by mouth 2 (two) times daily.   pantoprazole  (PROTONIX ) 40 MG tablet TAKE 1 TABLET EVERY DAY   pravastatin  (PRAVACHOL ) 10 MG tablet Take 1 tablet (10 mg total) by mouth daily.   rivastigmine  (EXELON ) 3 MG capsule Take 1 capsule (3 mg total) by mouth 2 (two) times daily.   sertraline  (ZOLOFT ) 100 MG tablet TAKE 1 AND 1/2 TABLETS EVERY DAY    traZODone  (DESYREL ) 100 MG tablet TAKE 1 TABLET AT BEDTIME   ibuprofen  (ADVIL ) 200 MG tablet Take 200 mg by mouth every 8 (eight) hours as needed (pain). (Patient not taking: Reported on 07/16/2024)   lidocaine  (LIDODERM ) 5 % Place 1 patch onto the skin daily. Remove & Discard patch within 12 hours or as directed by MD (Patient taking differently: Place 1 patch onto the skin daily as needed (pain). Remove & Discard patch within 12 hours or as directed by MD)   No facility-administered encounter medications on file as of 07/16/2024.    Past Medical History:  Diagnosis Date   ADHD (attention deficit hyperactivity disorder) 2020   ADD   Anxiety    Arthritis    Broken heart syndrome 2013   Carpal tunnel syndrome    Cataract    Chronic kidney disease    Dementia (HCC)    Depression    Diabetes mellitus without complication (HCC)    Type 2   Dysrhythmia 1976   tachycardia   Fall 04/10/2015   GERD (gastroesophageal reflux disease)    Goiter    History of kidney stones    History of shingles    On abdomen, left ring finger, and left leg; Pt takes Valtrex    Hyperlipidemia    Hypertension    Hypothyroidism    lung ca 04/2021   Obesity    Pneumonia  PTSD (post-traumatic stress disorder)    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   Shortness of breath dyspnea    pt stated its related to the back problems she has   Thyroid  disease    Ulcer     Past Surgical History:  Procedure Laterality Date   ABDOMINAL HYSTERECTOMY  1996   BRONCHIAL BRUSHINGS  06/29/2021   Procedure: BRONCHIAL BRUSHINGS;  Surgeon: Shelah Lamar RAMAN, MD;  Location[facility]OSCOPY;  Service: Pulmonary;;   BRONCHIAL NEEDLE ASPIRATION BIOPSY  06/29/2021   Procedure: BRONCHIAL NEEDLE ASPIRATION BIOPSIES;  Surgeon: Shelah Lamar RAMAN, MD;  Location: [facility] ENDOSCOPY;  Service: Pulmonary;;   CATARACT EXTRACTION, BILATERAL     CESAREAN SECTION  1976, 1983, 1984   COLONOSCOPY W/ POLYPECTOMY     CYSTOSCOPY/URETEROSCOPY/HOLMIUM  LASER/STENT PLACEMENT Left 07/07/2024   Procedure: CYSTOSCOPY/URETEROSCOPY/HOLMIUM LASER/STENT PLACEMENT;  Surgeon: Georganne Penne SAUNDERS, MD;  Location: [facility] ORS;  Service: Urology;  Laterality: Left;   EYE SURGERY Bilateral 2014   cataratact and lasik surgery   FIDUCIAL MARKER PLACEMENT  06/29/2021   Procedure: FIDUCIAL DYE MARKING;  Surgeon: Shelah Lamar RAMAN, MD;  Location: [facility] ENDOSCOPY;  Service: Pulmonary;;   INTERCOSTAL NERVE BLOCK  06/29/2021   Procedure: INTERCOSTAL NERVE BLOCK;  Surgeon: Shyrl Linnie KIDD, MD;  Location: [facility] OR;  Service: Thoracic;;   KYPHOPLASTY N/A 06/02/2015   Procedure: KYPHOPLASTY;  Surgeon: Oneil Priestly, MD;  Location: [facility] OR;  Service: Orthopedics;  Laterality: N/A;  Thoracic 3, 8, 10 kyphoplasty   LOBECTOMY  06/29/2021   Procedure: RIGHT UPPER LOBECTOMY;  Surgeon: Shyrl Linnie KIDD, MD;  Location: [facility] OR;  Service: Thoracic;;   LYMPH NODE DISSECTION  06/29/2021   Procedure: LYMPH NODE DISSECTION;  Surgeon: Shyrl Linnie KIDD, MD;  Location: [facility] OR;  Service: Thoracic;;    Family History  Problem Relation Age of Onset   COPD Mother    Depression Mother    Hypertension Mother    Hyperlipidemia Mother    Breast cancer Mother 33   Diverticulosis Mother    Heart disease Father    Dementia Father    Breast cancer Sister 67   Breast cancer Sister 62 - 23   Non-Hodgkin's lymphoma Sister    Colon cancer Neg Hx     Social History   Socioeconomic History   Marital status: Widowed    Spouse name: Not on file   Number of children: 2   Years of education: Not on file   Highest education level: Master's degree (e.g., MA, MS, MEng, MEd, MSW, MBA)  Occupational History   Not on file  Tobacco Use   Smoking status: Former    Current packs/day: 0.00    Average packs/day: 0.5 packs/day for 4.0 years (2.0 ttl pk-yrs)    Types: Cigarettes    Start date: 06/28/2012    Quit date: 06/28/2016    Years since quitting: 8.0   Smokeless tobacco: Never  Vaping Use    Vaping status: Never Used  Substance and Sexual Activity   Alcohol  use: Not Currently    Comment: rare glass of wine (holidays only)   Drug use: Never   Sexual activity: Not Currently  Other Topics Concern   Not on file  Social History Narrative   Lives with her mother who is 58 yrs old   Social Drivers of Corporate Investment Banker Strain: Low Risk  (02/07/2024)   Overall Financial Resource Strain (CARDIA)    Difficulty of Paying Living Expenses: Not very hard  Food Insecurity: No Food Insecurity (02/07/2024)   Hunger Vital Sign    Worried About Running Out of Food in the Last Year: Never true    Ran Out of Food in the Last Year: Never true  Transportation Needs: No Transportation Needs (02/07/2024)   PRAPARE - Administrator, Civil Service (Medical): No    Lack of Transportation (Non-Medical): No  Physical Activity: Sufficiently Active (06/04/2023)   Exercise Vital Sign    Days of Exercise per Week: 6 days    Minutes of Exercise per Session: 60 min  Recent Concern: Physical Activity - Inactive (06/04/2023)   Exercise Vital Sign    Days of Exercise per Week: 0 days    Minutes of Exercise per Session: 0 min  Stress: No Stress Concern Present (02/07/2024)   [employer] of Occupational Health - Occupational Stress Questionnaire    Feeling of Stress: Only a little  Social Connections: Unknown (12/29/2023)   Social Connection and Isolation Panel    Frequency of Communication with Friends and Family: More than three times a week    Frequency of Social Gatherings with Friends and Family: More than three times a week    Attends Religious Services: More than 4 times per year    Active Member of [employer] or Organizations: No    Attends Banker Meetings: Patient declined    Marital Status: Patient declined  Intimate Partner Violence: Not At Risk (12/29/2023)   Humiliation, Afraid, Rape, and Kick questionnaire    Fear of Current or Ex-Partner: No     Emotionally Abused: No    Physically Abused: No    Sexually Abused: No    Review of Systems  All other systems reviewed and are negative.       Objective    BP 115/61   Pulse 75   Ht 5' 2 (1.575 m)   Wt 140 lb 9.6 oz (63.8 kg)   SpO2 93%   BMI 25.72 kg/m   Physical Exam Vitals and nursing note reviewed.  Constitutional:      General: She is not in acute distress. Cardiovascular:     Rate and Rhythm: Normal rate and regular rhythm.  Pulmonary:     Effort: Pulmonary effort is normal.     Breath sounds: Normal breath sounds.  Abdominal:     Palpations: Abdomen is soft.     Tenderness: There is no abdominal tenderness.  Neurological:     General: No focal deficit present.     Mental Status: She is alert and oriented to person, place, and time.  Psychiatric:        Mood and Affect: Mood normal.        Behavior: Behavior normal.         Assessment & Plan:   Type 2 diabetes mellitus without complications, unspecified whether long term insulin  use (HCC) -     POCT glycosylated hemoglobin (Hb A1C)  Depression, unspecified depression type  UTI symptoms -     POCT URINALYSIS DIP (CLINITEK)  Yeast vaginitis  Hypothyroidism, unspecified type  Mild dementia, unspecified dementia type, unspecified whether behavioral, psychotic, or mood disturbance or anxiety (HCC)  Other orders -     Fluconazole ; Take 1 tablet (150 mg total) by mouth once for 1 dose.  Dispense: 1 tablet; Refill: 0 -     Nitrofurantoin  Monohyd Macro; Take 1 capsule (100 mg total) by mouth 2 (two) times daily.  Dispense: 10 capsule; Refill: 0   Patient  appears stable. Continue   Return in about 3 months (around 10/14/2024) for follow up.   Tanda Raguel SQUIBB, MD

## 2024-07-17 ENCOUNTER — Ambulatory Visit: Admitting: Family Medicine

## 2024-07-21 ENCOUNTER — Encounter: Payer: Self-pay | Admitting: Family Medicine

## 2024-07-21 LAB — STONE ANALYSIS
Calcium Oxalate Monohydrate: 20 %
Uric Acid Calculi: 80 %
Weight Calculi: 6 mg

## 2024-07-23 NOTE — Progress Notes (Signed)
 07/27/2024 1:50 PM   Jenny Cardenas Apr 06, 1951 [id number]  Reason for visit: Follow up nephrolithiasis   HPI: 73 y.o. female, follow up with me today  S/p Left URS/LL + stent placement (07/07/24) for 2.2cm renal stone  - ~70-80% stone burden treated  - 80% uric acid, 20% CaOx mono  Possible  UTI (07/16/24) (w/ ureteral stent in place)  - UA with +nitrite/LE -took Macrobid   Prior HPI: Recent CT Chest for lung Ca surveillance Incidental 11mm Left renal pelvic stone, perinephric stranding, mild pelviectasis   First lifetime stone Denies recurrent UTI Daughter inquired about UTI today-subacute, several week history of cognitive decline (in the setting of diagnosed dementia Patient denies overt UTI symptoms   UA today-trace LE, 30 RBC, 30 WBC,>10 squamous + Family history of nephrolithiasis    Physical Exam: BP 122/67   Ht 5' 1 (1.549 m)   Wt 140 lb (63.5 kg)   BMI 26.45 kg/m    General: no acute distress, alert/oriented, conversational  HEENT: equal nondilated pupils CV: regular rate Lung: unlabored breathing, regular rate and rhythm  Abd: nondistended, nontender with palpation, no palpable masses  MSK: moving all extremities without issue, normal observed motor function   Cystoscopy Procedure Note:  Indication: Left ureteral stent removal  After informed consent and discussion of the procedure and its risks, Jenny Cardenas was positioned and prepped in the standard fashion. Cystoscopy was performed with a flexible cystoscope. The external vaginal anatomy, urethra, bladder neck and bladder mucosa were visualized in a systematic fashion. The ureteral orifices were noted in orthotopic location and orientation. There were no bladder mucosal lesions, stones, debris or anatomic variants noted.   Left ureteral stent visualized, controlled with flexible graspers and fully removed.  Patient tolerated the procedure well.  1 prophylactic dose of Bactrim  was given  today.  Jenny Skye, MD 07/27/2024     Laboratory Data:    Component Ref Range & Units (hover) 2 wk ago  Source Comment VC  Comment: Left Kidney CORRECTED ON 12/09 AT 1235: PREVIOUSLY REPORTED AS KIDNEY STONE  Color Calculi Tan  Size Calculi 4x3  Comment: (NOTE) Multiple pieces received.  Dimensions of the largest piece reported.  Weight Calculi 6 VC  Calcium  Oxalate Monohydrate 20 VC  Uric Acid Calculi 80 VC  Calculus Received Wet Comment VC   UA (07/16/24) - urine pH = 5.5  Pertinent Imaging: N/A    Assessment & Plan:    Nephrolithiasis Assessment & Plan: S/p Left URS/LL + stent placement (07/07/24) for 2.2cm renal stone  - ~70-80% stone burden treated  - 80% uric acid, 20% CaOx mono  Unfortunately, she is having moderate to severe ongoing stent related discomfort.  I reviewed her stone composition and offered 2 options: Proceed with second look ureteroscopy as we had previously discussed vs. start potassium citrate  for medical dissolution of uric acid stones, and follow a more conservative approach.  We may find a radiographic majority of her stone dissolves with potassium citrate , without necessitating further procedures.  Her and her daughter will much more aligned with this approach.  As such we removed the ureteral stent today in clinic via cystoscopy, procedure note above. will start potassium citrate  as below.  -Left ureteral stent removed today in clinic - Will start Kcit 10 mEq TID for uric acid lithiasis + urine pH 5.5. Goal urine pH = 6-6.5.  - RTC in 6-8 weeks with RBUS prior, UA same day to check pH   Orders: -  Potassium Citrate  ER -     Sulfamethoxazole -Trimethoprim        Jenny JONELLE Skye, MD  [facility] [address], [address] [place], [place] [address] [phone]

## 2024-07-23 NOTE — Assessment & Plan Note (Addendum)
 S/p Left URS/LL + stent placement (07/07/24) for 2.2cm renal stone  - ~70-80% stone burden treated  - 80% uric acid, 20% CaOx mono  Unfortunately, she is having moderate to severe ongoing stent related discomfort.  I reviewed her stone composition and offered 2 options: Proceed with second look ureteroscopy as we had previously discussed vs. start potassium citrate  for medical dissolution of uric acid stones, and follow a more conservative approach.  We may find a radiographic majority of her stone dissolves with potassium citrate , without necessitating further procedures.  Her and her daughter will much more aligned with this approach.  As such we removed the ureteral stent today in clinic via cystoscopy, procedure note above. will start potassium citrate  as below.  -Left ureteral stent removed today in clinic - Will start Kcit 10 mEq TID for uric acid lithiasis + urine pH 5.5. Goal urine pH = 6-6.5.  - RTC in 6-8 weeks with RBUS prior, UA same day to check pH

## 2024-07-27 ENCOUNTER — Ambulatory Visit: Admitting: Urology

## 2024-07-27 ENCOUNTER — Encounter: Payer: Self-pay | Admitting: Urology

## 2024-07-27 VITALS — BP 122/67 | Ht 61.0 in | Wt 140.0 lb

## 2024-07-27 DIAGNOSIS — N200 Calculus of kidney: Secondary | ICD-10-CM

## 2024-07-27 MED ORDER — SULFAMETHOXAZOLE-TRIMETHOPRIM 800-160 MG PO TABS
1.0000 | ORAL_TABLET | Freq: Once | ORAL | Status: AC
  Administered 2024-07-27: 14:00:00 1 via ORAL

## 2024-07-27 MED ORDER — POTASSIUM CITRATE ER 10 MEQ (1080 MG) PO TBCR: 10.0000 meq | EXTENDED_RELEASE_TABLET | Freq: Three times a day (TID) | ORAL | 3 refills | Status: AC

## 2024-07-27 MED ORDER — POTASSIUM CITRATE ER 10 MEQ (1080 MG) PO TBCR: 10.0000 meq | EXTENDED_RELEASE_TABLET | Freq: Three times a day (TID) | ORAL | Status: AC

## 2024-07-27 NOTE — Addendum Note (Signed)
 Addended by: GENITA HARLENE CROME on: 07/27/2024 01:59 PM   Modules accepted: Orders

## 2024-07-28 NOTE — Progress Notes (Signed)
 KRISANN MCKENNA                                          MRN: [id number]   07/28/2024   The [facility] Quality Team Specialist reviewed this patient medical record for the purposes of chart review for care gap closure. The following were reviewed: chart review for care gap closure-kidney health evaluation for diabetes:eGFR  and uACR.    [facility] Quality Team

## 2024-07-29 ENCOUNTER — Ambulatory Visit: Admitting: Urology

## 2024-07-29 ENCOUNTER — Encounter: Admitting: Urology

## 2024-08-03 ENCOUNTER — Other Ambulatory Visit: Payer: Self-pay | Admitting: Family Medicine

## 2024-08-27 ENCOUNTER — Ambulatory Visit

## 2024-09-01 ENCOUNTER — Ambulatory Visit: Payer: Self-pay

## 2024-09-01 ENCOUNTER — Encounter: Payer: Self-pay | Admitting: Family Medicine

## 2024-09-01 ENCOUNTER — Ambulatory Visit (INDEPENDENT_AMBULATORY_CARE_PROVIDER_SITE_OTHER): Admitting: Family Medicine

## 2024-09-01 VITALS — BP 115/61 | HR 62 | Ht 61.0 in | Wt 146.0 lb

## 2024-09-01 DIAGNOSIS — M25552 Pain in left hip: Secondary | ICD-10-CM | POA: Diagnosis not present

## 2024-09-01 MED ORDER — TRIAMCINOLONE ACETONIDE 40 MG/ML IJ SUSP
40.0000 mg | Freq: Once | INTRAMUSCULAR | Status: AC
  Administered 2024-09-01: 40 mg via INTRAMUSCULAR

## 2024-09-01 NOTE — Progress Notes (Signed)
 "  Established Patient Office Visit  Subjective    Patient ID: Jenny Cardenas, female    DOB: 07/05/1951  Age: 74 y.o. MRN: [id number]  CC:  Chief Complaint  Patient presents with   Hip Pain    Pt is here for left hip pain. Pt reports has been going on for a cold weeks    HPI Jenny Cardenas presents for persistent and worsening left hip pain. Patient is using a rollator. She almost called EMS secondary to pain. Denies known trauma or injury.   Outpatient Encounter Medications as of 09/01/2024  Medication Sig   albuterol  (PROVENTIL ) (2.5 MG/3ML) 0.083% nebulizer solution Take 3 mLs (2.5 mg total) by nebulization every 4 (four) hours as needed for wheezing or shortness of breath.   albuterol  (VENTOLIN  HFA) 108 (90 Base) MCG/ACT inhaler Inhale 1-2 puffs into the lungs every 6 (six) hours as needed for wheezing or shortness of breath. This is a one-time refill from me until the patient establish care with her primary care physician.  Please do not send request for refill to me in the future.   buPROPion  (WELLBUTRIN  XL) 300 MG 24 hr tablet TAKE 1 TABLET EVERY DAY   cyanocobalamin  (VITAMIN B12) 1000 MCG tablet Take 1 tablet (1,000 mcg total) by mouth daily.   ibuprofen  (ADVIL ) 200 MG tablet Take 200 mg by mouth every 8 (eight) hours as needed (pain).   levocetirizine (XYZAL ) 5 MG tablet Take 5 mg by mouth daily as needed for allergies.   levothyroxine  (SYNTHROID ) 25 MCG tablet Take 1 tablet (25 mcg total) by mouth daily before breakfast.   metoprolol  succinate (TOPROL -XL) 25 MG 24 hr tablet Take 1 tablet (25 mg total) by mouth daily.   pantoprazole  (PROTONIX ) 40 MG tablet TAKE 1 TABLET EVERY DAY   potassium citrate  (UROCIT-K ) 10 MEQ (1080 MG) SR tablet Take 1 tablet (10 mEq total) by mouth 3 (three) times daily with meals.   pravastatin  (PRAVACHOL ) 10 MG tablet TAKE 1 TABLET EVERY DAY   rivastigmine  (EXELON ) 3 MG capsule Take 1 capsule (3 mg total) by mouth 2 (two) times daily.   sertraline   (ZOLOFT ) 100 MG tablet TAKE 1 AND 1/2 TABLETS EVERY DAY   traZODone  (DESYREL ) 100 MG tablet TAKE 1 TABLET AT BEDTIME   fluconazole  (DIFLUCAN ) 150 MG tablet Take 150 mg by mouth once. (Patient not taking: Reported on 09/01/2024)   lidocaine  (LIDODERM ) 5 % Place 1 patch onto the skin daily. Remove & Discard patch within 12 hours or as directed by MD (Patient taking differently: Place 1 patch onto the skin daily as needed (pain). Remove & Discard patch within 12 hours or as directed by MD)   nitrofurantoin , macrocrystal-monohydrate, (MACROBID ) 100 MG capsule Take 1 capsule (100 mg total) by mouth 2 (two) times daily. (Patient not taking: Reported on 09/01/2024)   Facility-Administered Encounter Medications as of 09/01/2024  Medication   potassium citrate  (UROCIT-K ) SR tablet 10 mEq   triamcinolone  acetonide (KENALOG -40) injection 40 mg    Past Medical History:  Diagnosis Date   ADHD (attention deficit hyperactivity disorder) 2020   ADD   Anxiety    Arthritis    Broken heart syndrome 2013   Carpal tunnel syndrome    Cataract    Chronic kidney disease    Dementia (HCC)    Depression    Diabetes mellitus without complication (HCC)    Type 2   Dysrhythmia 1976   tachycardia   Fall 04/10/2015   GERD (gastroesophageal reflux  disease)    Goiter    History of kidney stones    History of shingles    On abdomen, left ring finger, and left leg; Pt takes Valtrex    Hyperlipidemia    Hypertension    Hypothyroidism    lung ca 04/2021   Obesity    Pneumonia    PTSD (post-traumatic stress disorder)    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   Shortness of breath dyspnea    pt stated its related to the back problems she has   Thyroid  disease    Ulcer     Past Surgical History:  Procedure Laterality Date   ABDOMINAL HYSTERECTOMY  1996   BRONCHIAL BRUSHINGS  06/29/2021   Procedure: BRONCHIAL BRUSHINGS;  Surgeon: Shelah Lamar RAMAN, MD;  Location: [facility] ENDOSCOPY;  Service: Pulmonary;;   BRONCHIAL  NEEDLE ASPIRATION BIOPSY  06/29/2021   Procedure: BRONCHIAL NEEDLE ASPIRATION BIOPSIES;  Surgeon: Shelah Lamar RAMAN, MD;  Location: [facility] ENDOSCOPY;  Service: Pulmonary;;   CATARACT EXTRACTION, BILATERAL     CESAREAN SECTION  1976, 1983, 1984   COLONOSCOPY W/ POLYPECTOMY     CYSTOSCOPY/URETEROSCOPY/HOLMIUM LASER/STENT PLACEMENT Left 07/07/2024   Procedure: CYSTOSCOPY/URETEROSCOPY/HOLMIUM LASER/STENT PLACEMENT;  Surgeon: Georganne Penne SAUNDERS, MD;  Location: [facility] ORS;  Service: Urology;  Laterality: Left;   EYE SURGERY Bilateral 2014   cataratact and lasik surgery   FIDUCIAL MARKER PLACEMENT  06/29/2021   Procedure: FIDUCIAL DYE MARKING;  Surgeon: Shelah Lamar RAMAN, MD;  Location: [facility] ENDOSCOPY;  Service: Pulmonary;;   INTERCOSTAL NERVE BLOCK  06/29/2021   Procedure: INTERCOSTAL NERVE BLOCK;  Surgeon: Shyrl Linnie KIDD, MD;  Location: [facility] OR;  Service: Thoracic;;   KYPHOPLASTY N/A 06/02/2015   Procedure: KYPHOPLASTY;  Surgeon: Oneil Priestly, MD;  Location: [facility] OR;  Service: Orthopedics;  Laterality: N/A;  Thoracic 3, 8, 10 kyphoplasty   LOBECTOMY  06/29/2021   Procedure: RIGHT UPPER LOBECTOMY;  Surgeon: Shyrl Linnie KIDD, MD;  Location: [facility] OR;  Service: Thoracic;;   LYMPH NODE DISSECTION  06/29/2021   Procedure: LYMPH NODE DISSECTION;  Surgeon: Shyrl Linnie KIDD, MD;  Location: [facility] OR;  Service: Thoracic;;    Family History  Problem Relation Age of Onset   COPD Mother    Depression Mother    Hypertension Mother    Hyperlipidemia Mother    Breast cancer Mother 22   Diverticulosis Mother    Heart disease Father    Dementia Father    Breast cancer Sister 59   Breast cancer Sister 61 - 53   Non-Hodgkin's lymphoma Sister    Colon cancer Neg Hx     Social History   Socioeconomic History   Marital status: Widowed    Spouse name: Not on file   Number of children: 2   Years of education: Not on file   Highest education level: Master's degree (e.g., MA, MS, MEng, MEd, MSW, MBA)   Occupational History   Not on file  Tobacco Use   Smoking status: Former    Current packs/day: 0.00    Average packs/day: 0.5 packs/day for 4.0 years (2.0 ttl pk-yrs)    Types: Cigarettes    Start date: 06/28/2012    Quit date: 06/28/2016    Years since quitting: 8.1   Smokeless tobacco: Never  Vaping Use   Vaping status: Never Used  Substance and Sexual Activity   Alcohol  use: Not Currently    Comment: rare glass of wine (holidays only)   Drug use: Never   Sexual activity: Not  Currently  Other Topics Concern   Not on file  Social History Narrative   Lives with her mother who is 72 yrs old   Social Drivers of Health   Tobacco Use: Medium Risk (09/01/2024)   Patient History    Smoking Tobacco Use: Former    Smokeless Tobacco Use: Never    Passive Exposure: Not on file  Financial Resource Strain: Low Risk (02/07/2024)   Overall Financial Resource Strain (CARDIA)    Difficulty of Paying Living Expenses: Not very hard  Food Insecurity: No Food Insecurity (02/07/2024)   Epic    Worried About Radiation Protection Practitioner of Food in the Last Year: Never true    Ran Out of Food in the Last Year: Never true  Transportation Needs: No Transportation Needs (02/07/2024)   Epic    Lack of Transportation (Medical): No    Lack of Transportation (Non-Medical): No  Physical Activity: Sufficiently Active (06/04/2023)   Exercise Vital Sign    Days of Exercise per Week: 6 days    Minutes of Exercise per Session: 60 min  Recent Concern: Physical Activity - Inactive (06/04/2023)   Exercise Vital Sign    Days of Exercise per Week: 0 days    Minutes of Exercise per Session: 0 min  Stress: No Stress Concern Present (02/07/2024)   [employer] of Occupational Health - Occupational Stress Questionnaire    Feeling of Stress: Only a little  Social Connections: Unknown (12/29/2023)   Social Connection and Isolation Panel    Frequency of Communication with Friends and Family: More than three times a week     Frequency of Social Gatherings with Friends and Family: More than three times a week    Attends Religious Services: More than 4 times per year    Active Member of [employer] or Organizations: No    Attends Banker Meetings: Patient declined    Marital Status: Patient declined  Intimate Partner Violence: Not At Risk (12/29/2023)   Humiliation, Afraid, Rape, and Kick questionnaire    Fear of Current or Ex-Partner: No    Emotionally Abused: No    Physically Abused: No    Sexually Abused: No  Depression (PHQ2-9): Medium Risk (02/17/2024)   Depression (PHQ2-9)    PHQ-2 Score: 5  Alcohol  Screen: Low Risk (06/04/2023)   Alcohol  Screen    Last Alcohol  Screening Score (AUDIT): 0  Housing: Low Risk (02/07/2024)   Epic    Unable to Pay for Housing in the Last Year: No    Number of Times Moved in the Last Year: 0    Homeless in the Last Year: No  Utilities: Not At Risk (02/07/2024)   Epic    Threatened with loss of utilities: No  Health Literacy: Adequate Health Literacy (02/07/2024)   B1300 Health Literacy    Frequency of need for help with medical instructions: Never    Review of Systems  All other systems reviewed and are negative.       Objective    BP 115/61   Pulse 62   Ht 5' 1 (1.549 m)   Wt 146 lb (66.2 kg)   SpO2 95%   BMI 27.59 kg/m   Physical Exam Vitals and nursing note reviewed.  Constitutional:      General: She is not in acute distress. Cardiovascular:     Rate and Rhythm: Normal rate and regular rhythm.  Pulmonary:     Effort: Pulmonary effort is normal.     Breath sounds: Normal breath sounds.  Abdominal:     Palpations: Abdomen is soft.     Tenderness: There is no abdominal tenderness.  Musculoskeletal:     Left hip: Tenderness present. No deformity. Decreased range of motion.  Neurological:     General: No focal deficit present.     Mental Status: She is alert and oriented to person, place, and time.  Psychiatric:        Mood and Affect: Mood  normal.        Behavior: Behavior normal.         Assessment & Plan:   Left hip pain -     Ambulatory referral to Orthopedic Surgery -     Triamcinolone  Acetonide   Patient to utilize otc tylenol /topicals prn  Return if symptoms worsen or fail to improve.   Tanda Raguel SQUIBB, MD  "

## 2024-09-01 NOTE — Telephone Encounter (Signed)
 FYI Only or Action Required?: FYI only for provider: appointment scheduled on 09/02/23.  Patient was last seen in primary care on 07/16/2024 by Tanda Bleacher, MD.  Called Nurse Triage reporting Hip Pain.  Symptoms began a week ago.  Interventions attempted: OTC medications: Tylenol  and Motrin .  Symptoms are: unchanged.  Triage Disposition: See PCP When Office is Open (Within 3 Days)  Patient/caregiver understands and will follow disposition?: yes     Message from [facility] P sent at 09/01/2024  7:52 AM EST  Reason for Triage: Hip has been bothering her for a week. she almost fell last night. feels like on fire all the way to her foot. Left hip .   Reason for Disposition  [1] MODERATE pain (e.g., interferes with normal activities, limping) AND [2] present > 3 days  Answer Assessment - Initial Assessment Questions Pt with dementia. Daughter calling on pt's behalf. Limited hx available.     1. LOCATION and RADIATION: Where is the pain located? Does the pain spread (shoot) anywhere else?     Left hip radiates to foot       3. SEVERITY: How bad is the pain? What does it keep you from doing?   (Scale 1-10; or mild, moderate, severe)     Didn't sleep overnight. 4. ONSET: When did the pain start? Does it come and go, or is it there all the time?     Over a week ago  6. CAUSE: What do you think is causing the hip pain?      Cold weather  8. OTHER SYMPTOMS: Do you have any other symptoms? (e.g., back pain, pain shooting down leg,  fever, rash)     Pain shooting down leg  Protocols used: Hip Pain-A-AH

## 2024-09-02 ENCOUNTER — Other Ambulatory Visit: Payer: Self-pay | Admitting: Family Medicine

## 2024-09-04 ENCOUNTER — Ambulatory Visit
Admission: RE | Admit: 2024-09-04 | Discharge: 2024-09-04 | Disposition: A | Source: Ambulatory Visit | Attending: Urology

## 2024-09-04 DIAGNOSIS — N200 Calculus of kidney: Secondary | ICD-10-CM | POA: Insufficient documentation

## 2024-09-17 ENCOUNTER — Other Ambulatory Visit: Payer: Self-pay | Admitting: Family Medicine

## 2024-09-17 DIAGNOSIS — E042 Nontoxic multinodular goiter: Secondary | ICD-10-CM

## 2024-10-14 ENCOUNTER — Ambulatory Visit: Admitting: Urology

## 2024-11-06 ENCOUNTER — Ambulatory Visit: Admitting: Family Medicine

## 2024-12-02 ENCOUNTER — Encounter (INDEPENDENT_AMBULATORY_CARE_PROVIDER_SITE_OTHER): Admitting: Ophthalmology

## 2025-04-13 ENCOUNTER — Other Ambulatory Visit

## 2025-04-22 ENCOUNTER — Ambulatory Visit: Admitting: Internal Medicine
